# Patient Record
Sex: Female | Born: 1980
Health system: Southern US, Community
[De-identification: ages and names within clinical notes are randomized; demographics above are authoritative.]

## PROBLEM LIST (undated history)

## (undated) DIAGNOSIS — Z9889 Other specified postprocedural states: Secondary | ICD-10-CM

## (undated) DIAGNOSIS — N979 Female infertility, unspecified: Secondary | ICD-10-CM

## (undated) DIAGNOSIS — R112 Nausea with vomiting, unspecified: Secondary | ICD-10-CM

## (undated) DIAGNOSIS — Z8669 Personal history of other diseases of the nervous system and sense organs: Secondary | ICD-10-CM

## (undated) DIAGNOSIS — F329 Major depressive disorder, single episode, unspecified: Secondary | ICD-10-CM

## (undated) DIAGNOSIS — F32A Depression, unspecified: Secondary | ICD-10-CM

## (undated) DIAGNOSIS — I340 Nonrheumatic mitral (valve) insufficiency: Secondary | ICD-10-CM

## (undated) DIAGNOSIS — R002 Palpitations: Secondary | ICD-10-CM

## (undated) DIAGNOSIS — J45909 Unspecified asthma, uncomplicated: Secondary | ICD-10-CM

## (undated) DIAGNOSIS — K219 Gastro-esophageal reflux disease without esophagitis: Secondary | ICD-10-CM

## (undated) DIAGNOSIS — I341 Nonrheumatic mitral (valve) prolapse: Secondary | ICD-10-CM

## (undated) DIAGNOSIS — F419 Anxiety disorder, unspecified: Secondary | ICD-10-CM

## (undated) DIAGNOSIS — Z8571 Personal history of Hodgkin lymphoma: Secondary | ICD-10-CM

## (undated) DIAGNOSIS — E039 Hypothyroidism, unspecified: Secondary | ICD-10-CM

## (undated) DIAGNOSIS — K121 Other forms of stomatitis: Secondary | ICD-10-CM

## (undated) DIAGNOSIS — E785 Hyperlipidemia, unspecified: Secondary | ICD-10-CM

## (undated) HISTORY — DX: Other forms of stomatitis: K12.1

## (undated) HISTORY — DX: Depression, unspecified: F32.A

## (undated) HISTORY — DX: Hypothyroidism, unspecified: E03.9

## (undated) HISTORY — DX: Major depressive disorder, single episode, unspecified: F32.9

## (undated) HISTORY — DX: Gastro-esophageal reflux disease without esophagitis: K21.9

## (undated) HISTORY — PX: TRANSTHORACIC ECHOCARDIOGRAM: SHX275

## (undated) HISTORY — DX: Anxiety disorder, unspecified: F41.9

## (undated) HISTORY — DX: Nonrheumatic mitral (valve) insufficiency: I34.0

## (undated) HISTORY — DX: Personal history of other diseases of the nervous system and sense organs: Z86.69

## (undated) HISTORY — PX: WISDOM TOOTH EXTRACTION: SHX21

## (undated) HISTORY — DX: Personal history of Hodgkin lymphoma: Z85.71

## (undated) HISTORY — DX: Nonrheumatic mitral (valve) prolapse: I34.1

## (undated) HISTORY — PX: FOOT SURGERY: SHX648

## (undated) HISTORY — DX: Palpitations: R00.2

---
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
## 2000-07-17 HISTORY — PX: BREAST ENHANCEMENT SURGERY: SHX7

## 2004-07-17 HISTORY — PX: INSERTION CENTRAL VENOUS ACCESS DEVICE W/ SUBCUTANEOUS PORT: SUR725

## 2004-07-17 HISTORY — PX: LYMPH NODE BIOPSY: SHX201

## 2005-07-17 DIAGNOSIS — Z8571 Personal history of Hodgkin lymphoma: Secondary | ICD-10-CM

## 2005-07-17 HISTORY — PX: RHINOPLASTY: SUR1284

## 2005-07-17 HISTORY — DX: Personal history of Hodgkin lymphoma: Z85.71

## 2006-01-07 ENCOUNTER — Emergency Department: Payer: Self-pay | Admitting: Unknown Physician Specialty

## 2006-05-11 ENCOUNTER — Ambulatory Visit: Payer: Self-pay | Admitting: Family Medicine

## 2006-05-11 ENCOUNTER — Ambulatory Visit: Payer: Self-pay | Admitting: Internal Medicine

## 2006-05-14 ENCOUNTER — Ambulatory Visit (HOSPITAL_COMMUNITY): Admission: RE | Admit: 2006-05-14 | Discharge: 2006-05-14 | Payer: Self-pay | Admitting: Internal Medicine

## 2006-05-15 ENCOUNTER — Ambulatory Visit: Payer: Self-pay | Admitting: Surgery

## 2006-05-17 ENCOUNTER — Ambulatory Visit: Payer: Self-pay | Admitting: Internal Medicine

## 2006-05-22 ENCOUNTER — Ambulatory Visit: Payer: Self-pay | Admitting: Surgery

## 2006-06-12 ENCOUNTER — Inpatient Hospital Stay: Payer: Self-pay | Admitting: Surgery

## 2006-06-12 ENCOUNTER — Other Ambulatory Visit: Payer: Self-pay

## 2006-06-16 ENCOUNTER — Ambulatory Visit: Payer: Self-pay | Admitting: Internal Medicine

## 2006-07-05 ENCOUNTER — Inpatient Hospital Stay: Payer: Self-pay | Admitting: Internal Medicine

## 2006-07-17 ENCOUNTER — Ambulatory Visit: Payer: Self-pay | Admitting: Internal Medicine

## 2006-08-12 ENCOUNTER — Other Ambulatory Visit: Payer: Self-pay

## 2006-08-12 ENCOUNTER — Inpatient Hospital Stay: Payer: Self-pay | Admitting: Oncology

## 2006-08-17 ENCOUNTER — Ambulatory Visit: Payer: Self-pay | Admitting: Internal Medicine

## 2006-09-15 ENCOUNTER — Ambulatory Visit: Payer: Self-pay | Admitting: Internal Medicine

## 2006-09-23 ENCOUNTER — Emergency Department (HOSPITAL_COMMUNITY): Admission: EM | Admit: 2006-09-23 | Discharge: 2006-09-23 | Payer: Self-pay | Admitting: Emergency Medicine

## 2006-10-16 ENCOUNTER — Ambulatory Visit: Payer: Self-pay | Admitting: Internal Medicine

## 2006-10-22 ENCOUNTER — Inpatient Hospital Stay: Payer: Self-pay | Admitting: Internal Medicine

## 2006-11-15 ENCOUNTER — Ambulatory Visit: Payer: Self-pay | Admitting: Internal Medicine

## 2006-11-23 ENCOUNTER — Ambulatory Visit: Payer: Self-pay | Admitting: Internal Medicine

## 2006-12-16 ENCOUNTER — Ambulatory Visit: Payer: Self-pay | Admitting: Internal Medicine

## 2007-01-15 ENCOUNTER — Ambulatory Visit: Payer: Self-pay | Admitting: Internal Medicine

## 2007-01-21 ENCOUNTER — Inpatient Hospital Stay: Payer: Self-pay | Admitting: Internal Medicine

## 2007-02-15 ENCOUNTER — Ambulatory Visit: Payer: Self-pay | Admitting: Internal Medicine

## 2007-06-09 ENCOUNTER — Emergency Department: Payer: Self-pay | Admitting: Emergency Medicine

## 2007-10-04 ENCOUNTER — Emergency Department (HOSPITAL_COMMUNITY): Admission: EM | Admit: 2007-10-04 | Discharge: 2007-10-05 | Payer: Self-pay | Admitting: Emergency Medicine

## 2007-10-16 ENCOUNTER — Ambulatory Visit: Payer: Self-pay | Admitting: Internal Medicine

## 2008-03-17 ENCOUNTER — Ambulatory Visit: Payer: Self-pay | Admitting: Internal Medicine

## 2008-05-27 ENCOUNTER — Other Ambulatory Visit: Payer: Self-pay | Admitting: Emergency Medicine

## 2008-05-27 ENCOUNTER — Inpatient Hospital Stay (HOSPITAL_COMMUNITY): Admission: AD | Admit: 2008-05-27 | Discharge: 2008-05-27 | Payer: Self-pay | Admitting: Obstetrics and Gynecology

## 2008-05-27 ENCOUNTER — Other Ambulatory Visit: Payer: Self-pay | Admitting: Obstetrics and Gynecology

## 2008-05-28 ENCOUNTER — Ambulatory Visit: Payer: Self-pay | Admitting: Unknown Physician Specialty

## 2008-06-01 ENCOUNTER — Emergency Department: Payer: Self-pay | Admitting: Internal Medicine

## 2009-02-06 ENCOUNTER — Inpatient Hospital Stay: Payer: Self-pay | Admitting: Internal Medicine

## 2009-11-10 ENCOUNTER — Ambulatory Visit: Payer: Self-pay | Admitting: Family Medicine

## 2009-11-11 ENCOUNTER — Emergency Department (HOSPITAL_COMMUNITY): Admission: EM | Admit: 2009-11-11 | Discharge: 2009-11-11 | Payer: Self-pay | Admitting: Emergency Medicine

## 2010-02-21 ENCOUNTER — Other Ambulatory Visit: Admission: RE | Admit: 2010-02-21 | Discharge: 2010-02-21 | Payer: Self-pay | Admitting: Obstetrics and Gynecology

## 2010-03-23 ENCOUNTER — Ambulatory Visit: Payer: Self-pay | Admitting: Family Medicine

## 2010-08-30 ENCOUNTER — Other Ambulatory Visit (HOSPITAL_COMMUNITY): Payer: Self-pay | Admitting: Obstetrics and Gynecology

## 2010-08-30 DIAGNOSIS — N979 Female infertility, unspecified: Secondary | ICD-10-CM

## 2010-09-05 ENCOUNTER — Institutional Professional Consult (permissible substitution) (INDEPENDENT_AMBULATORY_CARE_PROVIDER_SITE_OTHER): Payer: Managed Care, Other (non HMO) | Admitting: Family Medicine

## 2010-09-05 DIAGNOSIS — Z79899 Other long term (current) drug therapy: Secondary | ICD-10-CM

## 2010-09-05 DIAGNOSIS — F410 Panic disorder [episodic paroxysmal anxiety] without agoraphobia: Secondary | ICD-10-CM

## 2010-09-05 DIAGNOSIS — I7300 Raynaud's syndrome without gangrene: Secondary | ICD-10-CM

## 2010-09-05 DIAGNOSIS — E039 Hypothyroidism, unspecified: Secondary | ICD-10-CM

## 2010-09-07 ENCOUNTER — Ambulatory Visit (HOSPITAL_COMMUNITY)
Admission: RE | Admit: 2010-09-07 | Discharge: 2010-09-07 | Disposition: A | Discharge status: Home / Self Care | Payer: Managed Care, Other (non HMO) | Source: Ambulatory Visit | Attending: Obstetrics and Gynecology | Admitting: Obstetrics and Gynecology

## 2010-09-07 DIAGNOSIS — N979 Female infertility, unspecified: Secondary | ICD-10-CM

## 2010-09-15 DIAGNOSIS — I340 Nonrheumatic mitral (valve) insufficiency: Secondary | ICD-10-CM

## 2010-09-15 DIAGNOSIS — I341 Nonrheumatic mitral (valve) prolapse: Secondary | ICD-10-CM

## 2010-09-15 HISTORY — DX: Nonrheumatic mitral (valve) prolapse: I34.1

## 2010-09-15 HISTORY — DX: Nonrheumatic mitral (valve) insufficiency: I34.0

## 2010-11-15 HISTORY — PX: TEE WITHOUT CARDIOVERSION: SHX5443

## 2010-11-23 ENCOUNTER — Ambulatory Visit: Payer: Self-pay | Admitting: Internal Medicine

## 2010-11-25 ENCOUNTER — Encounter: Payer: Self-pay | Admitting: Medical

## 2010-11-25 ENCOUNTER — Ambulatory Visit (HOSPITAL_COMMUNITY)
Admission: RE | Admit: 2010-11-25 | Discharge: 2010-11-25 | Disposition: A | Discharge status: Home / Self Care | Payer: Managed Care, Other (non HMO) | Source: Ambulatory Visit | Attending: Medical | Admitting: Medical

## 2010-11-25 ENCOUNTER — Ambulatory Visit (INDEPENDENT_AMBULATORY_CARE_PROVIDER_SITE_OTHER): Payer: Managed Care, Other (non HMO) | Admitting: Medical

## 2010-11-25 VITALS — BP 110/62 | HR 72 | Ht 66.0 in | Wt 131.0 lb

## 2010-11-25 DIAGNOSIS — R011 Cardiac murmur, unspecified: Secondary | ICD-10-CM

## 2010-11-25 DIAGNOSIS — R0602 Shortness of breath: Secondary | ICD-10-CM

## 2010-11-25 DIAGNOSIS — R079 Chest pain, unspecified: Secondary | ICD-10-CM

## 2010-11-25 DIAGNOSIS — R5383 Other fatigue: Secondary | ICD-10-CM

## 2010-11-25 DIAGNOSIS — R5381 Other malaise: Secondary | ICD-10-CM

## 2010-11-25 NOTE — Patient Instructions (Signed)
Go and have chest xray today.  Dr. Susann Givens will call you with results and plan.

## 2010-11-25 NOTE — Progress Notes (Signed)
Here today for c/o shortness of breath and fatigue.  Hasn't been feeling well. She was in her usual state of health until Monday of this week. She has a history of Hodgkin's lymphoma. She is in remission and sees [facility] for recheck regularly. Since Monday she has been having frequent chest pains that are sharp and brief, sometimes worse with activity, she notes some recent ankle swelling with no prior history of ankle swelling, she also notes some shortness of breath in general both with rest and activity. She notes more short of breath when lying flat. She has also had some occasional difficulty breathing awakening her from sleep. She has a history of hypothyroidism, however this week she has been much more tired than usual. She went to urgent care twice this week, on the first visit she was prescribed an antibiotic eyedrop for conjunctivitis. She also went to urgent care earlier today, was told that she was having lung spasms and panic attack, was given a breathing treatment, and she felt a little better. However she just continues to feel worse. She has a history of asthma, uses Advair occasionally although that's an old prescription, but hasn't had to use a rescue inhaler in quite some time.  Her oncologist is Dr. Greggory Stallion at [facility]. She has had prior treatment with Adriamycin and and Vinblastin.  Past Medical History  Diagnosis Date  . History of Hodgkin's lymphoma 2007    in remission, sees [facility], Dr. Greggory Stallion  . Hypothyroidism   . Depression   . Anxiety     Review of Systems Constitutional: Positive fatigue; denies fever, chills, sweats, unexpected weight change, anorexia Allergy: negative; denies recent sneezing, itching, congestion Dermatology: denies rash, lumps ENT: Positive hoarseness; no runny nose, ear pain, sore throat, sinus pain Cardiology: + for chest pain, palpitations, lower extremity, edema, orthopnea, paroxysmal nocturnal dyspnea Respiratory: Positive for frequent dry  hacking cough, cough, shortness of breath, dyspnea on exertion, wheezing; no hemoptysis Gastroenterology: Positive for nausea; denies abdominal pain,  vomiting, diarrhea, constipation, changes in bowel movement, dysphagia Hematology: denies bleeding or bruising problems Musculoskeletal: denies arthralgias, myalgias, joint swelling, back pain, neck pain, cramping Ophthalmology: Positive for redness, itching; denies vision changes, discharge Urology: Positive for frequency, denies dysuria, difficulty urinating, hematuria, urinary urgency, incontinence Neurology: no headache, weakness, tingling, numbness, speech abnormality, memory loss, falls, dizziness Psychology: denies depressed mood, agitation, sleep problems   Objective:   Filed Vitals:   11/25/10 1611  BP: 110/62  Pulse: 72    General appearance: alert, no distress, WD/WN, white female HEENT: normocephalic, conjunctiva/corneas normal, sclerae anicteric, PERRLA, EOMi, nares patent, no discharge or erythema, pharynx normal Oral cavity: MMM, tongue normal, teeth normal Neck: supple, no lymphadenopathy, no thyromegaly, no JVD,  No bruit Chest: non tender, normal shape and expansion Heart: Positive for 3/6 holosystolic murmur at lower left sternal border,This seems to be a new murmur  no radiation of the murmur; otherwise RRR, normal S1, S2, no gallops. Lungs: CTA bilaterally, no wheezes, rhonchi, or rales Abdomen: +bs, soft, non tender, non distended, no masses, no hepatomegaly, no splenomegaly, no bruits Back: non tender, normal ROM, no scoliosis Extremities: no edema currently, no cyanosis, no clubbing Pulses: 2+ symmetric, upper and lower extremities, normal cap refill Neurological: alert, oriented x 3, CN2-12 intact Psychiatric: normal affect, behavior normal, pleasant   ASSESSMENT: Encounter Diagnoses  Name Primary?  . Heart murmur Yes  . Chest pain   . Shortness of breath   . Fatigue  EKG today with normal sinus  rhythm, rate 80 beats per minute, normal intervals, axis 80, nonspecific T wave inversions in V1 and V2, otherwise no ST changes, no prior to compare, risk factors he goes former smoker, prior chemotherapy and radiation treatment , impression normal EKG.  PLAN:  Discuss case with Dr. Susann Givens, discussed possible etiologies. I gave her a sample Xopenex inhaler to use when necessary for shortness of breath since her old inhaler and read out. She will go for chest x-ray now, and we will call her with plan. Advised that she would likely need additional evaluation with an echocardiogram.

## 2010-11-28 ENCOUNTER — Encounter: Payer: Self-pay | Admitting: Medical

## 2010-11-30 ENCOUNTER — Ambulatory Visit (HOSPITAL_COMMUNITY): Payer: Managed Care, Other (non HMO)

## 2010-12-06 ENCOUNTER — Ambulatory Visit (HOSPITAL_COMMUNITY)
Admission: RE | Admit: 2010-12-06 | Discharge: 2010-12-06 | Disposition: A | Discharge status: Home / Self Care | Payer: Managed Care, Other (non HMO) | Source: Ambulatory Visit | Attending: Cardiovascular Disease | Admitting: Cardiovascular Disease

## 2010-12-06 DIAGNOSIS — I059 Rheumatic mitral valve disease, unspecified: Secondary | ICD-10-CM | POA: Insufficient documentation

## 2010-12-11 ENCOUNTER — Emergency Department: Payer: Self-pay | Admitting: Emergency Medicine

## 2010-12-16 HISTORY — PX: NM MYOCAR PERF WALL MOTION: HXRAD629

## 2010-12-20 ENCOUNTER — Ambulatory Visit (HOSPITAL_COMMUNITY)
Admission: RE | Admit: 2010-12-20 | Discharge: 2010-12-20 | Disposition: A | Discharge status: Home / Self Care | Payer: Managed Care, Other (non HMO) | Source: Ambulatory Visit | Attending: Cardiology | Admitting: Cardiology

## 2010-12-20 DIAGNOSIS — R0609 Other forms of dyspnea: Secondary | ICD-10-CM | POA: Insufficient documentation

## 2010-12-20 DIAGNOSIS — R079 Chest pain, unspecified: Secondary | ICD-10-CM | POA: Insufficient documentation

## 2010-12-20 DIAGNOSIS — R0989 Other specified symptoms and signs involving the circulatory and respiratory systems: Secondary | ICD-10-CM | POA: Insufficient documentation

## 2010-12-29 ENCOUNTER — Ambulatory Visit (INDEPENDENT_AMBULATORY_CARE_PROVIDER_SITE_OTHER): Payer: Managed Care, Other (non HMO) | Admitting: Family Medicine

## 2010-12-29 ENCOUNTER — Encounter: Payer: Self-pay | Admitting: Family Medicine

## 2010-12-29 VITALS — BP 120/86 | HR 80 | Wt 130.0 lb

## 2010-12-29 DIAGNOSIS — Z79899 Other long term (current) drug therapy: Secondary | ICD-10-CM

## 2010-12-29 DIAGNOSIS — R0602 Shortness of breath: Secondary | ICD-10-CM

## 2010-12-29 DIAGNOSIS — C8190 Hodgkin lymphoma, unspecified, unspecified site: Secondary | ICD-10-CM | POA: Insufficient documentation

## 2010-12-29 DIAGNOSIS — E039 Hypothyroidism, unspecified: Secondary | ICD-10-CM

## 2010-12-29 DIAGNOSIS — I059 Rheumatic mitral valve disease, unspecified: Secondary | ICD-10-CM

## 2010-12-29 DIAGNOSIS — I341 Nonrheumatic mitral (valve) prolapse: Secondary | ICD-10-CM | POA: Insufficient documentation

## 2010-12-29 DIAGNOSIS — I7300 Raynaud's syndrome without gangrene: Secondary | ICD-10-CM

## 2010-12-29 DIAGNOSIS — R490 Dysphonia: Secondary | ICD-10-CM

## 2010-12-29 LAB — COMPREHENSIVE METABOLIC PANEL
Albumin: 4.6 g/dL (ref 3.5–5.2)
BUN: 14 mg/dL (ref 6–23)
Calcium: 9.7 mg/dL (ref 8.4–10.5)
Chloride: 104 mEq/L (ref 96–112)
Glucose, Bld: 100 mg/dL — ABNORMAL HIGH (ref 70–99)
Potassium: 4 mEq/L (ref 3.5–5.3)

## 2010-12-29 LAB — CBC WITH DIFFERENTIAL/PLATELET
Basophils Relative: 0 % (ref 0–1)
HCT: 37.9 % (ref 36.0–46.0)
Hemoglobin: 13 g/dL (ref 12.0–15.0)
MCHC: 34.3 g/dL (ref 30.0–36.0)
MCV: 91.5 fL (ref 78.0–100.0)
Monocytes Absolute: 0.7 10*3/uL (ref 0.1–1.0)
Monocytes Relative: 7 % (ref 3–12)
Neutro Abs: 6.2 10*3/uL (ref 1.7–7.7)

## 2010-12-29 MED ORDER — TRIAMCINOLONE ACETONIDE 0.1 % MT PSTE: PASTE | Freq: Two times a day (BID) | OROMUCOSAL | Status: DC

## 2010-12-29 NOTE — Progress Notes (Signed)
  Subjective:    Patient ID: Lisa Waters, female    DOB: 1980/08/20, 30 y.o.   MRN: [id number]  HPI She is here for consult concerning continued difficulty with chest pain/tightness/course voice it seems to be intermittent in nature. She has been seen recently by cardiology and apparently does have MVP and presently is on Corag. She's been getting a lot of multiple ulcers and is also on steroids. She's been tried on Xopenex did not help with her symptoms. She has had coronary function testing which was apparently negative. She was seen by her oncologist in March and a CTscan was apparently negative. She also has a history of Raynaud's phenomenon. Towards the end of the encounter she then did mention something about easy bruisability. No nosebleeds, blood in her urine or stool.   Review of Systems     Objective:   Physical Exam Alert and in no distress. Cardiac exam does show a systolic murmur. Lungs are clear to auscultation. Throat is clear. Oral much also does show an ulcerated lesion on the left lower lip       Assessment & Plan:  Etiology of her symptoms is unclear. I will have her use Prilosec 40 mg to see what if any effect this will have. I will also do routine blood screening. Prescription for Kenalog in [place] was also given. I had an extensive conversation with her concerning fact that all of these consolation of symptoms could be the sign of an under lying as yet undiagnosed problem.

## 2010-12-29 NOTE — Patient Instructions (Signed)
Use to Prilosec per day at the same time for the next week or 2 and let me know how you do. We will do the blood work and I will call you with the results

## 2010-12-30 ENCOUNTER — Telehealth: Payer: Self-pay

## 2010-12-30 NOTE — Telephone Encounter (Signed)
Called pt left message that labs ok and to follow up in july

## 2011-01-13 ENCOUNTER — Encounter: Payer: Self-pay | Admitting: Family Medicine

## 2011-01-20 ENCOUNTER — Encounter: Payer: Self-pay | Admitting: Family Medicine

## 2011-01-20 ENCOUNTER — Ambulatory Visit (INDEPENDENT_AMBULATORY_CARE_PROVIDER_SITE_OTHER): Payer: Managed Care, Other (non HMO) | Admitting: Family Medicine

## 2011-01-20 DIAGNOSIS — H109 Unspecified conjunctivitis: Secondary | ICD-10-CM

## 2011-01-20 DIAGNOSIS — J029 Acute pharyngitis, unspecified: Secondary | ICD-10-CM

## 2011-01-20 DIAGNOSIS — J309 Allergic rhinitis, unspecified: Secondary | ICD-10-CM

## 2011-01-20 LAB — POCT RAPID STREP A (OFFICE): Rapid Strep A Screen: NEGATIVE

## 2011-01-20 MED ORDER — POLYMYXIN B-TRIMETHOPRIM 10000-0.1 UNIT/ML-% OP SOLN: 1.0000 [drp] | OPHTHALMIC | Status: AC

## 2011-01-20 MED ORDER — FLUORESCEIN SODIUM 1 MG OP STRP: 1.0000 | ORAL_STRIP | Freq: Once | OPHTHALMIC | Status: DC

## 2011-01-20 NOTE — Progress Notes (Signed)
Patient presents for evaluation of bilateral eye pain and discharged.  First noticed a little bit of redness and discomfort starting yesterday.  Today is getting worse--some crusting noted this morning (but not crusted shut).  Notes green discharge.  Describes eyes as painful, unsure if itchy.  Eyes are sensitive to the sunlight today.  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  Also complaining of sore throat.  Hurts when she swallows, feels swollen.  Ears are a little itchy.  Some runny nose. Denies sneezing.  She has been remodeling a house--a lot of exposure to dust, etc with construction.  Past Medical History  Diagnosis Date  . History of Hodgkin's lymphoma 2007    in remission, sees [facility], Dr. Greggory Stallion  . Hypothyroidism   . Depression   . Anxiety     No past surgical history on file.  History   Social History  . Marital Status: Married    Spouse Name: N/A    Number of Children: N/A  . Years of Education: N/A   Occupational History  . works in [employer]   Social History Main Topics  . Smoking status: Former Smoker    Quit date: 11/06/2009  . Smokeless tobacco: Never Used  . Alcohol Use: Yes     1-2 drinks twice a month  . Drug Use: No  . Sexually Active: Not on file   Other Topics Concern  . Not on file   Social History Narrative  . No narrative on file    Family History  Problem Relation Age of Onset  . Heart disease Mother   . Heart disease Father     Current outpatient prescriptions:carvedilol (COREG) 6.25 MG tablet, Take 6.25 mg by mouth 2 (two) times daily with a meal.  , Disp: , Rfl: ;  levothyroxine (SYNTHROID) 88 MCG tablet, Take 88 mcg by mouth daily.  , Disp: , Rfl: ;  sertraline (ZOLOFT) 50 MG tablet, Take 50 mg by mouth daily.  , Disp: , Rfl: ;  triamcinolone (KENALOG) 0.1 % paste, Place onto teeth 2 (two) times daily., Disp: 5 g, Rfl: 12 DISCONTD: levothyroxine (SYNTHROID,  LEVOTHROID) 88 MCG tablet, Take 88 mcg by mouth daily.  , Disp: , Rfl: ;  trimethoprim-polymyxin b (POLYTRIM) ophthalmic solution, Place 1 drop into both eyes every 4 (four) hours., Disp: 10 mL, Rfl: 0;  DISCONTD: azithromycin (ZITHROMAX) 250 MG tablet, Take 2 tablets by mouth on day 1, followed by 1 tablet by mouth daily for 4 days.  , Disp: , Rfl:  DISCONTD: predniSONE (DELTASONE) 10 MG tablet pack, Take 10 mg by mouth daily.  , Disp: , Rfl:  Current facility-administered medications:fluorescein ophthalmic strip 1 strip, 1 strip, Both Eyes, Once, Lavonda Jumbo, MD  Allergies  Allergen Reactions  . Codeine Hives   ROS:  Denies fever, chest pain, cough, SOB, rash, edema.  See HPI   PHYSICAL EXAM: BP 102/62  Pulse 84  Temp(Src) 98.4 F (36.9 C) (Oral)  Ht 5\' 6"  (1.676 m)  Wt 136 lb (61.689 kg)  BMI 21.95 kg/m2  LMP 01/13/2011 HEENT: PERRL, mild conjunctival injection bilaterally, but worse on R than L (and moderate involvement medially).  Fluorescein exam--no abnormal uptake.  Normal fundoscopic exam.  Mild STS R medial eye.  No erythema/swelling of surrounding eye/face.  +green crusting noted medially R eye. TM's and EAC's normal bilaterally.  Nasal mucosa mildly edematous, no erythema,  clear drainage. OP normal without significant erythema or exudates Neck:  Shotty anterior cervical lymphadenopathy Heart:  Regular rate and rhythm, murmur barely audible Lungs: clear bilaterally Skin: tanned, no rash Psych: normal mood, affect, hygiene and grooming  ASSESSMENT/PLAN: 1. Pharyngitis  POCT rapid strep A  2. Conjunctivitis  fluorescein ophthalmic strip 1 strip, trimethoprim-polymyxin b (POLYTRIM) ophthalmic solution  3. Allergic rhinitis, cause unspecified      Conjunctivitis--will treat for infection given purulent drainage, but likely has contribution of allergies/contact irritation from exposure to dust/construction Allergies-- Recommend antihistamines (Zyrtec or  Claritin) Pharyngitis--may be contributed by postnasal drip. Rapid strep negative

## 2011-01-20 NOTE — Patient Instructions (Signed)
Cool compresses to eyes.  Start Zyrtec (or claritin).  You may use tylenol, or advil or aleve for throat and eye pain.  Use eye drops as directed (every 3 hours while awake for first day, then can taper to every 4-6 hours; use for 1-2 days after eyes seem clear).  Follow up immediately if symptoms are worsening (ie. Worsening eye pain, vision problems, eye swelling) or any other concerns

## 2011-01-22 ENCOUNTER — Emergency Department: Payer: Self-pay | Admitting: Emergency Medicine

## 2011-01-23 ENCOUNTER — Emergency Department (HOSPITAL_COMMUNITY)
Admission: EM | Admit: 2011-01-23 | Discharge: 2011-01-23 | Disposition: A | Discharge status: Home / Self Care | Payer: Managed Care, Other (non HMO) | Attending: Emergency Medicine | Admitting: Emergency Medicine

## 2011-01-23 DIAGNOSIS — J029 Acute pharyngitis, unspecified: Secondary | ICD-10-CM | POA: Insufficient documentation

## 2011-01-23 DIAGNOSIS — E039 Hypothyroidism, unspecified: Secondary | ICD-10-CM | POA: Insufficient documentation

## 2011-01-23 DIAGNOSIS — K120 Recurrent oral aphthae: Secondary | ICD-10-CM | POA: Insufficient documentation

## 2011-01-23 DIAGNOSIS — Z79899 Other long term (current) drug therapy: Secondary | ICD-10-CM | POA: Insufficient documentation

## 2011-01-23 DIAGNOSIS — B9789 Other viral agents as the cause of diseases classified elsewhere: Secondary | ICD-10-CM | POA: Insufficient documentation

## 2011-01-30 ENCOUNTER — Other Ambulatory Visit: Payer: Self-pay | Admitting: Family Medicine

## 2011-01-30 MED ORDER — OMEPRAZOLE 40 MG PO CPDR: 40.0000 mg | DELAYED_RELEASE_CAPSULE | Freq: Every day | ORAL | Status: DC

## 2011-01-30 NOTE — Telephone Encounter (Signed)
The 2 Prilosec day you reccommended per day is helping, is there an Rx she can get, to help with cost of taking 2 per day OTC  [address]

## 2011-01-30 NOTE — Telephone Encounter (Signed)
Find out what the strength of Prilosec as. It is 20 mg give her 40. She is on 40% there is no way to get more

## 2011-03-03 ENCOUNTER — Other Ambulatory Visit: Payer: Self-pay

## 2011-03-03 MED ORDER — OMEPRAZOLE 40 MG PO CPDR: 40.0000 mg | DELAYED_RELEASE_CAPSULE | Freq: Every day | ORAL | Status: DC

## 2011-03-08 ENCOUNTER — Telehealth: Payer: Self-pay | Admitting: Family Medicine

## 2011-03-08 NOTE — Telephone Encounter (Signed)
FAXED APPROVAL TO PHARMACY

## 2011-03-13 ENCOUNTER — Other Ambulatory Visit: Payer: Self-pay | Admitting: Family Medicine

## 2011-03-13 NOTE — Telephone Encounter (Signed)
IS THIS OK 

## 2011-04-04 ENCOUNTER — Telehealth: Payer: Self-pay | Admitting: Family Medicine

## 2011-04-04 MED ORDER — OMEPRAZOLE 40 MG PO CPDR: 40.0000 mg | DELAYED_RELEASE_CAPSULE | Freq: Every day | ORAL | Status: DC

## 2011-04-04 NOTE — Telephone Encounter (Signed)
Sent med in first to wrong [facility]

## 2011-04-10 LAB — CBC
Platelets: 257
WBC: 6.5

## 2011-04-10 LAB — I-STAT 8, (EC8 V) (CONVERTED LAB)
BUN: 10
Bicarbonate: 24.6 — ABNORMAL HIGH
Chloride: 107
HCT: 42
Hemoglobin: 14.3
Operator id: 295131
Sodium: 140

## 2011-04-10 LAB — DIFFERENTIAL
Lymphocytes Relative: 12
Lymphs Abs: 0.8
Neutrophils Relative %: 68

## 2011-04-10 LAB — POCT I-STAT CREATININE
Creatinine, Ser: 0.8
Operator id: 295131

## 2011-04-10 LAB — POCT CARDIAC MARKERS
Myoglobin, poc: 13.8
Troponin i, poc: <0.05

## 2011-04-18 LAB — CROSSMATCH
Antibody Screen: POSITIVE
DAT, IgG: NEGATIVE

## 2011-04-18 LAB — URINALYSIS, ROUTINE W REFLEX MICROSCOPIC
Bilirubin Urine: NEGATIVE
Leukocytes, UA: NEGATIVE
Nitrite: NEGATIVE
Specific Gravity, Urine: 1.008
Urobilinogen, UA: 0.2

## 2011-04-18 LAB — DIFFERENTIAL
Basophils Absolute: 0
Basophils Relative: 0
Lymphocytes Relative: 22
Neutro Abs: 3
Neutrophils Relative %: 54

## 2011-04-18 LAB — URINE MICROSCOPIC-ADD ON

## 2011-04-18 LAB — POCT PREGNANCY, URINE
Preg Test, Ur: NEGATIVE
Preg Test, Ur: NEGATIVE

## 2011-04-18 LAB — CBC
MCV: 97.8
RBC: 3.93
WBC: 5.5

## 2011-04-18 LAB — APTT: aPTT: 28

## 2011-04-18 LAB — URINE CULTURE: Colony Count: 40000

## 2011-04-18 LAB — PROTIME-INR
INR: 1
Prothrombin Time: 13.8

## 2011-06-04 ENCOUNTER — Other Ambulatory Visit: Payer: Self-pay | Admitting: Family Medicine

## 2011-06-17 ENCOUNTER — Other Ambulatory Visit: Payer: Self-pay | Admitting: Family Medicine

## 2011-06-19 NOTE — Telephone Encounter (Signed)
Is this ok?

## 2011-06-19 NOTE — Telephone Encounter (Signed)
She needs an appointment.

## 2011-06-19 NOTE — Telephone Encounter (Signed)
Called pt to let her know med called in but she needs appt left message

## 2011-06-29 ENCOUNTER — Ambulatory Visit (INDEPENDENT_AMBULATORY_CARE_PROVIDER_SITE_OTHER): Payer: Managed Care, Other (non HMO) | Admitting: Family Medicine

## 2011-06-29 ENCOUNTER — Encounter: Payer: Self-pay | Admitting: Family Medicine

## 2011-06-29 VITALS — BP 100/70 | HR 73 | Wt 144.0 lb

## 2011-06-29 DIAGNOSIS — F329 Major depressive disorder, single episode, unspecified: Secondary | ICD-10-CM

## 2011-06-29 DIAGNOSIS — Z8669 Personal history of other diseases of the nervous system and sense organs: Secondary | ICD-10-CM

## 2011-06-29 DIAGNOSIS — F32A Depression, unspecified: Secondary | ICD-10-CM

## 2011-06-29 DIAGNOSIS — Z8571 Personal history of Hodgkin lymphoma: Secondary | ICD-10-CM | POA: Insufficient documentation

## 2011-06-29 DIAGNOSIS — I7300 Raynaud's syndrome without gangrene: Secondary | ICD-10-CM

## 2011-06-29 DIAGNOSIS — E039 Hypothyroidism, unspecified: Secondary | ICD-10-CM

## 2011-06-29 DIAGNOSIS — I059 Rheumatic mitral valve disease, unspecified: Secondary | ICD-10-CM

## 2011-06-29 DIAGNOSIS — R51 Headache: Secondary | ICD-10-CM

## 2011-06-29 DIAGNOSIS — I341 Nonrheumatic mitral (valve) prolapse: Secondary | ICD-10-CM

## 2011-06-29 DIAGNOSIS — F341 Dysthymic disorder: Secondary | ICD-10-CM

## 2011-06-29 NOTE — Progress Notes (Signed)
  Subjective:    Patient ID: Lisa Waters, female    DOB: [birth year], 30 y.o.   MRN: [id number]  HPI She is here for a recheck and refill on Zoloft. She was originally given this in February for panic attacks and depression. It did return her to her normal state. There were less stresses causing difficulty with her about a year ago. She also has continuation of her Raynaud's phenomenon. She did see her oncologist in [place] last month and was told everything is good from her underlying Hodgkin's disease. In the last several weeks she has had the episode of the right I visual changes however since then she has had daily headaches as well as fatigue and aches and pains that are intermittent and in various parts of her body. She describes the pain is sharp, throbbing and bilateral, photophobia but no nausea. She has noted CNS changes of decreased cognition. No family history of headache. She does not drink nor do street drugs.  Review of Systems     Objective:   Physical Exam alert and in no distress. EOMI. DTRs normal. Cerebellar normal. Tympanic membranes and canals are normal. Throat is clear. Tonsils are normal. Neck is supple without adenopathy or thyromegaly. Cardiac exam shows a regular sinus rhythm without murmurs or gallops. Lungs are clear to auscultation.        Assessment & Plan:   1. MVP (mitral valve prolapse)    2. Raynaud's disease    3. History of Hodgkin's disease    4. Anxiety and depression    5. History of amaurosis fugax    6. Hypothyroid    7. Headache  MR Brain W Wo Contrast   She will followup with her cardiologist as scheduled. She has an appointment to be seen by neurology for the amaurosis. I will get blood work, x-ray report and last note from her oncologist.

## 2011-07-03 ENCOUNTER — Telehealth: Payer: Self-pay

## 2011-07-03 ENCOUNTER — Ambulatory Visit
Admission: RE | Admit: 2011-07-03 | Discharge: 2011-07-03 | Disposition: A | Discharge status: Home / Self Care | Payer: Managed Care, Other (non HMO) | Source: Ambulatory Visit | Attending: Family Medicine | Admitting: Family Medicine

## 2011-07-03 DIAGNOSIS — R51 Headache: Secondary | ICD-10-CM

## 2011-07-03 MED ORDER — GADOBENATE DIMEGLUMINE 529 MG/ML IV SOLN
13.0000 mL | Freq: Once | INTRAVENOUS | Status: AC | PRN
  Administered 2011-07-03: 13 mL via INTRAVENOUS

## 2011-07-03 NOTE — Telephone Encounter (Signed)
Pt called to state symptoms worse headache,tinngleing in toes shooting pains in leg advise on what else she can do

## 2011-07-03 NOTE — Telephone Encounter (Signed)
Moved to today.

## 2011-07-03 NOTE — Telephone Encounter (Signed)
Try to get her MRI moved sooner.

## 2011-07-04 ENCOUNTER — Encounter: Payer: Self-pay | Admitting: Family Medicine

## 2011-07-04 ENCOUNTER — Other Ambulatory Visit: Payer: Self-pay | Admitting: Family Medicine

## 2011-07-04 ENCOUNTER — Ambulatory Visit (INDEPENDENT_AMBULATORY_CARE_PROVIDER_SITE_OTHER): Payer: Managed Care, Other (non HMO) | Admitting: Family Medicine

## 2011-07-04 VITALS — BP 110/77 | HR 77

## 2011-07-04 DIAGNOSIS — R292 Abnormal reflex: Secondary | ICD-10-CM

## 2011-07-04 DIAGNOSIS — G43909 Migraine, unspecified, not intractable, without status migrainosus: Secondary | ICD-10-CM

## 2011-07-04 NOTE — Patient Instructions (Signed)
Use the Treximet and let me know how works. Be aware that you might have side effects and if you do let me know per

## 2011-07-04 NOTE — Progress Notes (Signed)
  Subjective:    Patient ID: Lisa Waters, female    DOB: 03/03/1981, 30 y.o.   MRN: [id number]  HPI Continues to have difficulty with daily bitemporal throbbing type headaches with photophobia and phonophobia and nausea. No weakness, numbness or tingling. Also within the last week she has noted sharp stabbing type pains mainly in her right leg but also in her left axilla   Review of Systems     Objective:   Physical Exam Alert and in no distress. Extraocular muscles intact. Fundi benign. Neck is supple without adenopathy. Throat is clear. Cardiac exam normal. Reflexes were 3+ with 2 beat clonus noted.       Assessment & Plan:  History is consistent with migraine headache although her other pains are not as easily identified. A sample of fracture in that given. Discussed possible side effects with her. I will also draw routine bloods plus CRP and sedimentation rate.

## 2011-07-05 LAB — CBC WITH DIFFERENTIAL/PLATELET
Eosinophils Relative: 8 % — ABNORMAL HIGH (ref 0–5)
HCT: 37.5 % (ref 36.0–46.0)
Lymphocytes Relative: 27 % (ref 12–46)
Lymphs Abs: 1.5 10*3/uL (ref 0.7–4.0)
MCV: 90.8 fL (ref 78.0–100.0)
Monocytes Absolute: 0.3 10*3/uL (ref 0.1–1.0)
Monocytes Relative: 6 % (ref 3–12)
RBC: 4.13 MIL/uL (ref 3.87–5.11)
RDW: 12.8 % (ref 11.5–15.5)
WBC: 5.4 10*3/uL (ref 4.0–10.5)

## 2011-07-05 LAB — TSH: TSH: 1.984 u[IU]/mL (ref 0.350–4.500)

## 2011-07-05 LAB — COMPREHENSIVE METABOLIC PANEL
BUN: 11 mg/dL (ref 6–23)
CO2: 28 mEq/L (ref 19–32)
Calcium: 9.4 mg/dL (ref 8.4–10.5)
Chloride: 103 mEq/L (ref 96–112)
Creat: 0.8 mg/dL (ref 0.50–1.10)
Glucose, Bld: 80 mg/dL (ref 70–99)

## 2011-07-05 LAB — C-REACTIVE PROTEIN: CRP: 0.03 mg/dL (ref <0.60)

## 2011-07-05 MED ORDER — SUMATRIPTAN SUCCINATE 100 MG PO TABS: 100.0000 mg | ORAL_TABLET | ORAL | Status: DC | PRN

## 2011-07-05 NOTE — Progress Notes (Signed)
Addended by: Ronnald Nian on: 07/05/2011 01:58 PM   Modules accepted: Orders

## 2011-07-05 NOTE — Progress Notes (Signed)
  Subjective:    Patient ID: Lisa Waters, female    DOB: 02-Jun-1981, 30 y.o.   MRN: [id number]  HPI    Review of Systems     Objective:   Physical Exam        Assessment & Plan:  Her blood work is normal. She does state that the Treximet did help with her symptoms. I recommend she take sumatriptan again and repeated in 2 hours. She will call me tomorrow.

## 2011-07-06 ENCOUNTER — Other Ambulatory Visit: Payer: Managed Care, Other (non HMO)

## 2011-07-07 ENCOUNTER — Telehealth: Payer: Self-pay | Admitting: Internal Medicine

## 2011-07-07 NOTE — Telephone Encounter (Signed)
She is scheduled to see Dr. Pearlean Brownie Jan 21. See if you can get this moved up

## 2011-07-12 ENCOUNTER — Other Ambulatory Visit: Payer: Self-pay | Admitting: Family Medicine

## 2011-07-12 NOTE — Telephone Encounter (Signed)
Office closed. 

## 2011-07-13 NOTE — Telephone Encounter (Signed)
Is this ok?

## 2011-08-11 ENCOUNTER — Other Ambulatory Visit: Payer: Self-pay | Admitting: Family Medicine

## 2011-08-19 ENCOUNTER — Other Ambulatory Visit: Payer: Self-pay | Admitting: Family Medicine

## 2011-08-21 NOTE — Telephone Encounter (Signed)
Is this ok?

## 2011-08-28 ENCOUNTER — Ambulatory Visit (INDEPENDENT_AMBULATORY_CARE_PROVIDER_SITE_OTHER): Payer: Managed Care, Other (non HMO) | Admitting: Family Medicine

## 2011-08-28 VITALS — BP 110/64 | HR 66 | Temp 98.6°F | Resp 16 | Ht 66.0 in | Wt 142.6 lb

## 2011-08-28 DIAGNOSIS — R509 Fever, unspecified: Secondary | ICD-10-CM

## 2011-08-28 LAB — POCT INFLUENZA A/B
Influenza A, POC: NEGATIVE
Influenza B, POC: NEGATIVE

## 2011-08-28 MED ORDER — OSELTAMIVIR PHOSPHATE 75 MG PO CAPS: 75.0000 mg | ORAL_CAPSULE | Freq: Two times a day (BID) | ORAL | Status: AC

## 2011-08-28 NOTE — Progress Notes (Signed)
[facility]:  Office Visit  Chief Complaint:  Chief Complaint  Patient presents with  . Chills    today  . achy    today  . Fever    today    HPI: Lisa Waters is a 31 y.o. female who complains of  1 day h/o subjective fevers, chills, msk aches. No treatment. No other sxs.   Past Medical History  Diagnosis Date  . History of Hodgkin's lymphoma 2007    in remission, sees [facility], Dr. Greggory Waters  . Hypothyroidism   . Depression   . Anxiety   . GERD (gastroesophageal reflux disease)    Past Surgical History  Procedure Date  . Appendectomy   . Breast enhancement surgery   . Rhinoplasty deviated septum  . Lymphnode biopsy    History   Social History  . Marital Status: Married    Spouse Name: N/A    Number of Children: N/A  . Years of Education: N/A   Occupational History  . works in [employer]   Social History Main Topics  . Smoking status: Former Smoker -- 1.0 packs/day for 10 years    Types: Cigarettes    Quit date: 10/15/2009  . Smokeless tobacco: Never Used  . Alcohol Use: Yes     1-2 drinks twice a month  . Drug Use: No  . Sexually Active: None   Other Topics Concern  . None   Social History Narrative  . None   Family History  Problem Relation Age of Onset  . Heart disease Mother   . Heart disease Father    Allergies  Allergen Reactions  . Codeine Hives   Prior to Admission medications   Medication Sig Start Date End Date Taking? Authorizing Provider  levothyroxine (SYNTHROID) 88 MCG tablet Take 88 mcg by mouth daily.     Yes Historical Provider, MD  omeprazole (PRILOSEC) 40 MG capsule TAKE 1 CAPSULE BY MOUTH EVERY DAY 08/11/11  Yes Lisa Herter, MD  pindolol (VISKEN) 5 MG tablet Take 5 mg by mouth 2 (two) times daily.     Yes Historical Provider, MD  sertraline (ZOLOFT) 50 MG tablet TAKE 1 TABLET BY MOUTH EVERY DAY 08/19/11  Yes Lisa Herter, MD  carvedilol (COREG) 6.25 MG tablet Take 6.25 mg by mouth 2 (two)  times daily with a meal.      Historical Provider, MD  omeprazole (PRILOSEC) 40 MG capsule Take 1 capsule (40 mg total) by mouth daily. 04/04/11 04/03/12  Lisa Herter, MD  SUMAtriptan (IMITREX) 100 MG tablet Take 1 tablet (100 mg total) by mouth every 2 (two) hours as needed for migraine. Not to exceed more than 2 tablets per 24 hours 07/05/11 07/04/12  Lisa Herter, MD     ROS: The patient denies night sweats, unintentional weight loss, chest pain, palpitations, wheezing, dyspnea on exertion, nausea, vomiting, abdominal pain, dysuria, hematuria, melena, numbness, weakness, or tingling. +fevers, chills, msk aches.   All other systems have been reviewed and were otherwise negative with the exception of those mentioned in the HPI and as above.    PHYSICAL EXAM: Filed Vitals:   08/28/11 1808  BP: 110/64  Pulse: 66  Temp: 98.6 F (37 C)  Resp: 16   Filed Vitals:   08/28/11 1808  Height: 5\' 6"  (1.676 m)  Weight: 142 lb 9.6 oz (64.683 kg)   Body mass index is 23.02 kg/(m^2).  General: Alert, no acute distress HEENT:  Normocephalic,  atraumatic, oropharynx patent. NO LAD, TMs clear bilaterally, no sinus tenderness Cardiovascular:  Regular rate and rhythm, no rubs or gallops.  No Carotid bruits, radial pulse intact. No pedal edema. +click/murmur Respiratory: Clear to auscultation bilaterally.  No wheezes, rales, or rhonchi.  No cyanosis, no use of accessory musculature GI: No organomegaly, abdomen is soft and non-tender, positive bowel sounds.  No masses. Skin: No rashes. Neurologic: Facial musculature symmetric. Psychiatric: Patient is appropriate throughout our interaction. Lymphatic: No cervical lymphadenopathy Musculoskeletal: Gait intact.    ASSESSMENT/PLAN: Encounter Diagnosis  Name Primary?  . Fever chills Yes    Viral URI vs Flu- Flu negative but will presumptively treat as if have flu. She has a  Hx of Hodgkin's LYmphoma. Rx Tamiflu 75 mg BID x 5  days   Lisa Yager PHUONG, DO 08/28/2011 6:42 PM

## 2011-09-08 ENCOUNTER — Ambulatory Visit (INDEPENDENT_AMBULATORY_CARE_PROVIDER_SITE_OTHER): Payer: Managed Care, Other (non HMO) | Admitting: Medical

## 2011-09-08 ENCOUNTER — Encounter: Payer: Self-pay | Admitting: Medical

## 2011-09-08 VITALS — BP 102/70 | HR 100 | Temp 98.1°F | Resp 16 | Wt 138.0 lb

## 2011-09-08 DIAGNOSIS — K5289 Other specified noninfective gastroenteritis and colitis: Secondary | ICD-10-CM

## 2011-09-08 DIAGNOSIS — R195 Other fecal abnormalities: Secondary | ICD-10-CM

## 2011-09-08 DIAGNOSIS — R110 Nausea: Secondary | ICD-10-CM

## 2011-09-08 DIAGNOSIS — K921 Melena: Secondary | ICD-10-CM

## 2011-09-08 DIAGNOSIS — K529 Noninfective gastroenteritis and colitis, unspecified: Secondary | ICD-10-CM

## 2011-09-08 DIAGNOSIS — R197 Diarrhea, unspecified: Secondary | ICD-10-CM

## 2011-09-08 LAB — CBC WITH DIFFERENTIAL/PLATELET
Basophils Absolute: 0 10*3/uL (ref 0.0–0.1)
Basophils Relative: 1 % (ref 0–1)
Eosinophils Absolute: 0.4 10*3/uL (ref 0.0–0.7)
Hemoglobin: 12.6 g/dL (ref 12.0–15.0)
MCH: 30.4 pg (ref 26.0–34.0)
MCHC: 33.3 g/dL (ref 30.0–36.0)
Monocytes Relative: 14 % — ABNORMAL HIGH (ref 3–12)
Neutrophils Relative %: 47 % (ref 43–77)
RDW: 13.5 % (ref 11.5–15.5)

## 2011-09-08 NOTE — Progress Notes (Signed)
Subjective: Here today with 2 day hx/o nausea, diarrhea, numerous times the first day, then 7-8 loose diarrhea stools today.  Stomach is upset, nauseated, but no frank pain or vomiting.  She reports some dizziness, generalized weakness, decreased appetite, not able to replace her fluids fast enough to keep up with the loose stools.  Denies back pain, no fever, chills, or sweats.  She is worried because her stool today for the most part has been black in color.  She denies problems with constipation, hemorrhoids, denies bright red blood, no coffee ground stool, no hx/o ulcers, not using NSAIDs other than Aspirin daily.  She does note remote hx/o hematemesis years ago, had NG tube to aspirate stomach contents, but no hx/o EGD or ulcer.  She is taking Prilosec for hx/o GERD.  Denies sick contacts.  She notes eating some chili 2 days ago.  She denies recent travel, no undercooked meat ingested.  She was sick last week, put on Tamiflu for influenza.  No other aggravating or relieving factors.  No other c/o.  The following portions of the patient's history were reviewed and updated as appropriate: allergies, current medications, past family history, past medical history, past social history, past surgical history and problem list.  Past Medical History  Diagnosis Date  . History of Hodgkin's lymphoma 2007    in remission, sees [facility], Dr. Greggory Stallion  . Hypothyroidism   . Depression   . Anxiety   . GERD (gastroesophageal reflux disease)     Allergies  Allergen Reactions  . Codeine Hives    Review of Systems Gen: no fever, chills, weight loss Skin: rash Heent: runny nose, but no cough, no sore throat, no ear pain Heart: no CP, palpitations, edema Lungs: no SOB, wheezing GU: no burning, frequency, urgency, hematuria    Objective:   Physical Exam  General appearance: alert, no distress, WD/WN, fatigued appearing HEENT: normocephalic, sclerae anicteric, TMs pearly, nares patent, no discharge or erythema,  pharynx normal Oral cavity: MMM, no lesions Neck: supple, no lymphadenopathy, no thyromegaly, no masses Heart: RRR, normal S1, S2, no murmurs Lungs: CTA bilaterally, no wheezes, rhonchi, or rales Abdomen: +increased bs, soft, non tender, non distended, no masses, no hepatomegaly, no splenomegaly Rectal: no external hemorrhoids, occult negative, no obvious mass with DRE, exam chaperoned by nurse   Assessment and Plan :     Encounter Diagnoses  Name Primary?  . Diarrhea Yes  . Black stool   . Nausea   . Gastroenteritis    Orthostatics with drop in SBP and increase in pulse.  Advised that her symptoms mostly suggest viral gastroenteritis.  The black color could be from chili ingredients or viral etiology.  Can't rule out bleed entirely though.   Will check CBC stat.  Advised increased hydration, rest, wash hands often, discussed means of transmission of viral gastroenteritis.  Will call with lab results. For now, advises she stop Aspirin for few days, avoid NSAIDS, and increase Prilosec to BID for next several days, and if worse, unable to hydrate well, then go to the ED.  Otherwise f/u pending labs.

## 2011-09-16 ENCOUNTER — Other Ambulatory Visit: Payer: Self-pay | Admitting: Family Medicine

## 2011-09-18 ENCOUNTER — Other Ambulatory Visit: Payer: Self-pay | Admitting: Family Medicine

## 2011-09-18 NOTE — Telephone Encounter (Signed)
Is this ok?

## 2011-11-01 ENCOUNTER — Ambulatory Visit (INDEPENDENT_AMBULATORY_CARE_PROVIDER_SITE_OTHER): Payer: Managed Care, Other (non HMO) | Admitting: Family Medicine

## 2011-11-01 ENCOUNTER — Encounter: Payer: Self-pay | Admitting: Family Medicine

## 2011-11-01 VITALS — BP 108/70 | HR 61 | Wt 137.0 lb

## 2011-11-01 DIAGNOSIS — M67919 Unspecified disorder of synovium and tendon, unspecified shoulder: Secondary | ICD-10-CM

## 2011-11-01 DIAGNOSIS — M7581 Other shoulder lesions, right shoulder: Secondary | ICD-10-CM

## 2011-11-01 NOTE — Patient Instructions (Signed)
History shoulder does not return to normal after a short period of time, call me in a month or so.

## 2011-11-01 NOTE — Progress Notes (Signed)
  Subjective:    Patient ID: Lisa Waters, female    DOB: 1981-04-11, 31 y.o.   MRN: [id number]  HPI He has a one-month history of right shoulder pain. No history of injury to the gets increased pain with abduction and external rotate this does not wake her at night. She has not really tried anything to help this. 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   Review of Systems     Objective:   Physical Exam Full range of motion of the shoulder but some discomfort with motion. No laxity noted. Arm test negative. Supraspinatus testing was uncomfortable. Neer's and Hawkin's test was again uncomfortable. She also notes that her headache symptoms greatly cleared up after she got a a new pair of glasses.         Assessment & Plan:   1. Tendinitis of right rotator cuff    I discussed options with her including conservative care with anti-inflammatories and physical therapy. We also discussed injection. She would like to try an injection. 40 mg of Kenalog and 3 cc of Xylocaine was injected into the right subacromial bursa without difficulty. She tolerated the procedure well. Discussed followup based on pain. Hopefully this will give her several months of relief. If it does not, she is to call me

## 2011-11-03 ENCOUNTER — Telehealth: Payer: Self-pay | Admitting: Internal Medicine

## 2011-11-03 ENCOUNTER — Ambulatory Visit
Admission: RE | Admit: 2011-11-03 | Discharge: 2011-11-03 | Disposition: A | Discharge status: Home / Self Care | Payer: Managed Care, Other (non HMO) | Source: Ambulatory Visit | Attending: Family Medicine | Admitting: Family Medicine

## 2011-11-03 ENCOUNTER — Other Ambulatory Visit: Payer: Self-pay

## 2011-11-03 DIAGNOSIS — M25519 Pain in unspecified shoulder: Secondary | ICD-10-CM

## 2011-11-03 NOTE — Telephone Encounter (Signed)
Put order in system called pt and informed her of where to go

## 2011-11-03 NOTE — Telephone Encounter (Signed)
Send her for an xray 

## 2011-11-06 ENCOUNTER — Telehealth: Payer: Self-pay | Admitting: Internal Medicine

## 2011-11-07 NOTE — Telephone Encounter (Signed)
Left message word for word  

## 2011-11-07 NOTE — Telephone Encounter (Signed)
Pt states she has been getting dizzy spells and feels like she is being electrocuted in her hands and goes away after a few seconds. Pt just wants to know if its something to worry about

## 2011-11-07 NOTE — Telephone Encounter (Signed)
If this continues have her set up an appointment

## 2011-11-08 ENCOUNTER — Other Ambulatory Visit: Payer: Self-pay | Admitting: Family Medicine

## 2011-11-08 NOTE — Telephone Encounter (Signed)
Is this ok?

## 2011-11-13 ENCOUNTER — Ambulatory Visit (INDEPENDENT_AMBULATORY_CARE_PROVIDER_SITE_OTHER): Payer: Managed Care, Other (non HMO) | Admitting: Family Medicine

## 2011-11-13 VITALS — BP 110/60 | HR 86 | Temp 98.6°F | Wt 136.0 lb

## 2011-11-13 DIAGNOSIS — G8929 Other chronic pain: Secondary | ICD-10-CM

## 2011-11-13 DIAGNOSIS — M25559 Pain in unspecified hip: Secondary | ICD-10-CM

## 2011-11-13 DIAGNOSIS — R5383 Other fatigue: Secondary | ICD-10-CM

## 2011-11-13 DIAGNOSIS — R5381 Other malaise: Secondary | ICD-10-CM

## 2011-11-13 DIAGNOSIS — IMO0001 Reserved for inherently not codable concepts without codable children: Secondary | ICD-10-CM

## 2011-11-13 DIAGNOSIS — M25562 Pain in left knee: Secondary | ICD-10-CM

## 2011-11-13 DIAGNOSIS — M791 Myalgia, unspecified site: Secondary | ICD-10-CM

## 2011-11-13 LAB — COMPREHENSIVE METABOLIC PANEL
ALT: 15 U/L (ref 0–35)
AST: 23 U/L (ref 0–37)
BUN: 15 mg/dL (ref 6–23)
Calcium: 9.5 mg/dL (ref 8.4–10.5)
Creat: 0.81 mg/dL (ref 0.50–1.10)
Total Bilirubin: 0.4 mg/dL (ref 0.3–1.2)

## 2011-11-13 LAB — CBC WITH DIFFERENTIAL/PLATELET
Basophils Absolute: 0 10*3/uL (ref 0.0–0.1)
Eosinophils Absolute: 0.2 10*3/uL (ref 0.0–0.7)
Eosinophils Relative: 4 % (ref 0–5)
HCT: 38.3 % (ref 36.0–46.0)
MCH: 29.6 pg (ref 26.0–34.0)
MCHC: 31.6 g/dL (ref 30.0–36.0)
MCV: 93.6 fL (ref 78.0–100.0)
Monocytes Absolute: 0.4 10*3/uL (ref 0.1–1.0)
Platelets: 280 10*3/uL (ref 150–400)
RDW: 13.8 % (ref 11.5–15.5)

## 2011-11-13 NOTE — Progress Notes (Signed)
  Subjective:    Patient ID: Lisa Waters, female    DOB: January 15, 1981, 31 y.o.   MRN: [id number]  HPI She has a one-week history of dizziness, arm and joint as well as leg aching, fatigue, burning sensation in chest. She also complains of a tingling sensation in her fingers and toes. No sore throat, cough, earache, congestion   Review of Systems     Objective:   Physical Exam alert and in no distress. Tympanic membranes and canals are normal. Throat is clear. Tonsils are normal. Neck is supple without adenopathy or thyromegaly. Cardiac exam shows a regular sinus rhythm without murmurs or gallops. Lungs are clear to auscultation. Funduscopic exam normal. DTRs are 2-3+ and symmetric        Assessment & Plan:  Etiology of her symptoms is unclear. Blood work will be drawn. May possibly need to be referred for further evaluation.

## 2011-11-14 LAB — SEDIMENTATION RATE: Sed Rate: 1 mm/hr (ref 0–22)

## 2011-11-29 ENCOUNTER — Encounter: Payer: Self-pay | Admitting: Cardiology

## 2012-01-29 ENCOUNTER — Telehealth: Payer: Self-pay | Admitting: Family Medicine

## 2012-01-29 NOTE — Telephone Encounter (Signed)
PT CALLED AND STATED THAT HER ARM IS NOT ANY BETTER. SHE STATES THAT IT IS WORSE. PT WOULD LIKE TO KNOW WHAT NOW. PLEASE CALL. PT USES [facility] ON [address].

## 2012-01-29 NOTE — Telephone Encounter (Signed)
Pt has appt thursday

## 2012-01-29 NOTE — Telephone Encounter (Signed)
Have her schedule an appointment for followup

## 2012-02-01 ENCOUNTER — Ambulatory Visit (INDEPENDENT_AMBULATORY_CARE_PROVIDER_SITE_OTHER): Payer: Managed Care, Other (non HMO) | Admitting: Family Medicine

## 2012-02-01 ENCOUNTER — Encounter: Payer: Self-pay | Admitting: Family Medicine

## 2012-02-01 VITALS — BP 110/70 | HR 68 | Ht 67.0 in | Wt 137.0 lb

## 2012-02-01 DIAGNOSIS — M7551 Bursitis of right shoulder: Secondary | ICD-10-CM

## 2012-02-01 DIAGNOSIS — M67919 Unspecified disorder of synovium and tendon, unspecified shoulder: Secondary | ICD-10-CM

## 2012-02-01 DIAGNOSIS — M719 Bursopathy, unspecified: Secondary | ICD-10-CM

## 2012-02-01 NOTE — Progress Notes (Signed)
  Subjective:    Patient ID: Lisa Waters, female    DOB: 1981-04-03, 31 y.o.   MRN: [id number]  HPI She is here for consult concerning her right shoulder pain. She was seen in April and an injection was given. She obtained relief for roughly 2 months however in the last month she has noted the return of pain with abduction and external rotation. She also notes increased pain when she falls asleep at night. She also is noticing a slight tingling sensation in her shoulder when she is writing with her right hand.   Review of Systems     Objective:   Physical Exam Full motion of the shoulder. Neer's and Hawkin's test was uncomfortable. Supraspinatus testing normal. No crepitus noted.       Assessment & Plan:   1. Bursitis of right shoulder    after discussion with her, we have decided to try another injection. 40 mg of Kenalog and 3 cc of Xylocaine was injected into the subacromial bursa after prepping the skin with Betadine. She tolerated the procedure well. If she has difficulty with this within the next several months, further evaluation of the shoulder will be needed.

## 2012-02-01 NOTE — Patient Instructions (Signed)
If this either doesn't work all are only lasts for several months call me

## 2012-03-21 ENCOUNTER — Encounter: Payer: Self-pay | Admitting: Family Medicine

## 2012-03-21 ENCOUNTER — Ambulatory Visit (INDEPENDENT_AMBULATORY_CARE_PROVIDER_SITE_OTHER): Payer: Managed Care, Other (non HMO) | Admitting: Family Medicine

## 2012-03-21 VITALS — BP 110/70 | HR 102 | Wt 139.0 lb

## 2012-03-21 DIAGNOSIS — H53419 Scotoma involving central area, unspecified eye: Secondary | ICD-10-CM

## 2012-03-21 NOTE — Progress Notes (Signed)
  Subjective:    Patient ID: Lisa Waters, female    DOB: [birth year], 31 y.o.   MRN: [id number]  HPI Yesterday she experienced blindness in the right eye. She describes difficulty with central vision. She did see optometrist. She also complains of some dizziness as well as slight hoarse voice area no fever, chills, sore throat, earache, cough or congestion. She has a previous history of Hodgkin's disease her last chest x-ray was in 2012. She has had an MRI of her head which was negative.   Review of Systems     Objective:   Physical Exam alert and in no distress. EOMI. Normal light reflex in the eyes. Cerebellar testing normal. DTRs normal. Negative clonus.Tympanic membranes and canals are normal. Throat is clear. Tonsils are normal. Neck is supple without adenopathy or thyromegaly. Cardiac exam shows a regular sinus rhythm without murmurs or gallops. Lungs are clear to auscultation.        Assessment & Plan:   1. Central loss of vision  Ambulatory referral to Ophthalmology

## 2012-03-25 ENCOUNTER — Telehealth: Payer: Self-pay | Admitting: Internal Medicine

## 2012-03-25 MED ORDER — OMEPRAZOLE 40 MG PO CPDR: 40.0000 mg | DELAYED_RELEASE_CAPSULE | Freq: Every day | ORAL | Status: DC

## 2012-03-25 NOTE — Telephone Encounter (Signed)
Omeprazole sent in   

## 2012-04-09 DIAGNOSIS — N978 Female infertility of other origin: Secondary | ICD-10-CM | POA: Insufficient documentation

## 2012-05-08 ENCOUNTER — Telehealth: Payer: Self-pay | Admitting: Internal Medicine

## 2012-05-08 ENCOUNTER — Other Ambulatory Visit (HOSPITAL_COMMUNITY): Payer: Self-pay | Admitting: Cardiology

## 2012-05-08 DIAGNOSIS — G453 Amaurosis fugax: Secondary | ICD-10-CM

## 2012-05-08 MED ORDER — SERTRALINE HCL 50 MG PO TABS: 50.0000 mg | ORAL_TABLET | Freq: Every day | ORAL | Status: DC

## 2012-05-08 NOTE — Telephone Encounter (Signed)
Zoloft renewed.

## 2012-05-20 ENCOUNTER — Ambulatory Visit (INDEPENDENT_AMBULATORY_CARE_PROVIDER_SITE_OTHER): Payer: Managed Care, Other (non HMO) | Admitting: Family Medicine

## 2012-05-20 VITALS — BP 110/70 | HR 85 | Wt 138.0 lb

## 2012-05-20 DIAGNOSIS — M25519 Pain in unspecified shoulder: Secondary | ICD-10-CM

## 2012-05-20 DIAGNOSIS — G453 Amaurosis fugax: Secondary | ICD-10-CM

## 2012-05-20 DIAGNOSIS — H3400 Transient retinal artery occlusion, unspecified eye: Secondary | ICD-10-CM

## 2012-05-20 DIAGNOSIS — M25511 Pain in right shoulder: Secondary | ICD-10-CM

## 2012-05-20 DIAGNOSIS — Z23 Encounter for immunization: Secondary | ICD-10-CM

## 2012-05-20 NOTE — Progress Notes (Signed)
  Subjective:    Patient ID: Lisa Waters, female    DOB: 1981/01/09, 31 y.o.   MRN: [id number]  HPI She is here for recheck on right shoulder pain. Review his record indicates she has had 2 injections, neither of which have helped. She notes increased pain with abduction, internal and external rotation She also is set up to have CT angiogram to evaluate for cause of amaurosis fugax.  Review of Systems     Objective:   Physical Exam Alert and in no distress. Pain on abduction and external as well as internal rotation. Juanetta Gosling' and Neer's  test is positive. No point tenderness is noted. Negative sulcus sign.       Assessment & Plan:   1. Right shoulder pain  MR Shoulder Right Wo Contrast  2. Amaurosis fugax of right eye     she is scheduled to see neurology in the near future. I will discuss with the neurologist whether CT or MRI is the appropriate next diagnostic procedure.

## 2012-05-21 ENCOUNTER — Ambulatory Visit (HOSPITAL_COMMUNITY)
Admission: RE | Admit: 2012-05-21 | Discharge: 2012-05-21 | Disposition: A | Discharge status: Home / Self Care | Payer: Managed Care, Other (non HMO) | Source: Ambulatory Visit | Attending: Cardiology | Admitting: Cardiology

## 2012-05-21 ENCOUNTER — Ambulatory Visit (HOSPITAL_COMMUNITY): Payer: Managed Care, Other (non HMO)

## 2012-05-21 DIAGNOSIS — G453 Amaurosis fugax: Secondary | ICD-10-CM

## 2012-05-21 DIAGNOSIS — H3400 Transient retinal artery occlusion, unspecified eye: Secondary | ICD-10-CM | POA: Insufficient documentation

## 2012-05-21 MED ORDER — IOHEXOL 350 MG/ML SOLN
50.0000 mL | Freq: Once | INTRAVENOUS | Status: AC | PRN
  Administered 2012-05-21: 50 mL via INTRAVENOUS

## 2012-05-23 ENCOUNTER — Ambulatory Visit
Admission: RE | Admit: 2012-05-23 | Discharge: 2012-05-23 | Disposition: A | Discharge status: Home / Self Care | Payer: Managed Care, Other (non HMO) | Source: Ambulatory Visit | Attending: Family Medicine | Admitting: Family Medicine

## 2012-05-23 DIAGNOSIS — M25511 Pain in right shoulder: Secondary | ICD-10-CM

## 2012-05-27 NOTE — Progress Notes (Signed)
Quick Note:  Let her know that the MRI did not show anything torn but did show some tendon damage. Let her know that we can refer her back to orthopedics or potentially give her another injection however if she still has difficulty after this injection than referral would be definitely necessary ______

## 2012-05-27 NOTE — Progress Notes (Signed)
Quick Note:  Called pt cell# left message to call me back ______ 

## 2012-05-29 ENCOUNTER — Other Ambulatory Visit: Payer: Self-pay | Admitting: Family Medicine

## 2012-06-25 ENCOUNTER — Telehealth: Payer: Self-pay | Admitting: Family Medicine

## 2012-06-25 MED ORDER — OMEPRAZOLE 40 MG PO CPDR: 40.0000 mg | DELAYED_RELEASE_CAPSULE | Freq: Every day | ORAL | Status: DC

## 2012-06-25 NOTE — Telephone Encounter (Signed)
done

## 2012-07-05 ENCOUNTER — Telehealth: Payer: Self-pay | Admitting: Family Medicine

## 2012-07-08 ENCOUNTER — Telehealth: Payer: Self-pay | Admitting: Family Medicine

## 2012-07-08 MED ORDER — SERTRALINE HCL 50 MG PO TABS: 50.0000 mg | ORAL_TABLET | Freq: Every day | ORAL | Status: DC

## 2012-07-08 NOTE — Telephone Encounter (Signed)
Sent in 90 days rx for zoloft

## 2012-07-15 ENCOUNTER — Telehealth: Payer: Self-pay | Admitting: Family Medicine

## 2012-07-15 NOTE — Telephone Encounter (Signed)
LM

## 2012-07-18 NOTE — Telephone Encounter (Signed)
LM

## 2012-08-02 ENCOUNTER — Other Ambulatory Visit: Payer: Self-pay

## 2012-08-02 MED ORDER — OMEPRAZOLE 40 MG PO CPDR: 40.0000 mg | DELAYED_RELEASE_CAPSULE | Freq: Every day | ORAL | Status: DC

## 2012-08-30 ENCOUNTER — Encounter: Payer: Self-pay | Admitting: Family Medicine

## 2012-08-30 ENCOUNTER — Ambulatory Visit: Payer: Managed Care, Other (non HMO) | Admitting: Family Medicine

## 2012-08-30 ENCOUNTER — Ambulatory Visit (INDEPENDENT_AMBULATORY_CARE_PROVIDER_SITE_OTHER): Payer: Managed Care, Other (non HMO) | Admitting: Family Medicine

## 2012-08-30 VITALS — BP 120/74 | HR 68 | Temp 98.0°F | Ht 66.5 in | Wt 143.0 lb

## 2012-08-30 DIAGNOSIS — R05 Cough: Secondary | ICD-10-CM

## 2012-08-30 DIAGNOSIS — R0600 Dyspnea, unspecified: Secondary | ICD-10-CM

## 2012-08-30 DIAGNOSIS — R0689 Other abnormalities of breathing: Secondary | ICD-10-CM

## 2012-08-30 DIAGNOSIS — J209 Acute bronchitis, unspecified: Secondary | ICD-10-CM

## 2012-08-30 DIAGNOSIS — R0989 Other specified symptoms and signs involving the circulatory and respiratory systems: Secondary | ICD-10-CM

## 2012-08-30 DIAGNOSIS — R0609 Other forms of dyspnea: Secondary | ICD-10-CM

## 2012-08-30 DIAGNOSIS — R059 Cough, unspecified: Secondary | ICD-10-CM

## 2012-08-30 MED ORDER — ALBUTEROL SULFATE HFA 108 (90 BASE) MCG/ACT IN AERS: 2.0000 | INHALATION_SPRAY | Freq: Four times a day (QID) | RESPIRATORY_TRACT | Status: DC | PRN

## 2012-08-30 MED ORDER — HYDROCOD POLST-CHLORPHEN POLST 10-8 MG/5ML PO LQCR: 5.0000 mL | Freq: Every evening | ORAL | Status: DC | PRN

## 2012-08-30 MED ORDER — AZITHROMYCIN 250 MG PO TABS: ORAL_TABLET | ORAL | Status: DC

## 2012-08-30 NOTE — Progress Notes (Signed)
Chief Complaint  Patient presents with  . possible bronchitis    possible bronchitis, been going on since tuesday. hard to breath. having blood or green stuff come up. congested and coughing. laying down is worse   HPI:  Started 10 days ago with tightness in her chest, slight cough, progressed to a full cold, which improved with OTC meds.  Yesterday she awoke with chest tightness, shortness of breath.  Hard to lie down to sleep due to pressure in her chest--felt better sitting up.  Coughing up green phlegm, sometimes with streaks of blood.  Has mild runny nose, ongoing sinus issues x 1 month--bloody and green in the mornings (mostly, clears up throughout the day). +PND, and sometimes gags with coughing.  Some nausea, and diarrhea x 2 days. Denies sinus pain.  Some chills, but no known fevers.  +sick contact (husband with URI, brother-in-law with MRSA abscess).  Received flu shot this year.  Used dayquil, nyquil and mucinex last week, and restarted these meds yesterday.  Has used albuterol in the past--when diagnosed with lymphoma she had pneumonia, and required inhalers for a while.  Hasn't had problems since, and her breathing has been better since quitting smoking in 2011. Denies leg swelling or pain  Past Medical History  Diagnosis Date  . History of hodgkin's lymphoma 2007    in remission, sees [facility], Dr. Greggory Stallion  . Hypothyroidism   . Depression   . Anxiety   . GERD (gastroesophageal reflux disease)   . MVP (mitral valve prolapse)     Dr. Herbie Baltimore  . Migraine    Past Surgical History  Procedure Laterality Date  . Appendectomy    . Breast enhancement surgery    . Rhinoplasty  deviated septum  . Lymphnode biopsy     History   Social History  . Marital Status: Married    Spouse Name: N/A    Number of Children: N/A  . Years of Education: N/A   Occupational History  . works in [employer]   Social History Main Topics  . Smoking status: Former Smoker -- 1.00 packs/day for 10  years    Types: Cigarettes    Quit date: 10/15/2009  . Smokeless tobacco: Never Used  . Alcohol Use: No  . Drug Use: No  . Sexually Active: Yes -- Female partner(s)    Birth Control/ Protection: None   Other Topics Concern  . Not on file   Social History Narrative  . No narrative on file   Current outpatient prescriptions:aspirin 81 MG tablet, Take 81 mg by mouth daily., Disp: , Rfl: ;  levothyroxine (SYNTHROID) 88 MCG tablet, Take 88 mcg by mouth daily.  , Disp: , Rfl: ;  omeprazole (PRILOSEC) 40 MG capsule, Take 1 capsule (40 mg total) by mouth daily., Disp: 90 capsule, Rfl: 4;  pindolol (VISKEN) 5 MG tablet, Take 5 mg by mouth 2 (two) times daily.  , Disp: , Rfl:  sertraline (ZOLOFT) 50 MG tablet, Take 1 tablet (50 mg total) by mouth daily., Disp: 90 tablet, Rfl: 1;  SUMAtriptan (IMITREX) 100 MG tablet, Take 1 tablet (100 mg total) by mouth every 2 (two) hours as needed for migraine. Not to exceed more than 2 tablets per 24 hours, Disp: 10 tablet, Rfl: 1  Allergies  Allergen Reactions  . Codeine Hives   ROS:  +canker sores intermittently since October.  No fevers, +nausea and some diarrhea (yesterday and this morning).  No vomiting.  Denies skin rashes, bleeding/bruising.  +  chest pressure, feels like her resting heart rate is a little faster, harder--no irregular rhythm or tachycardia, just different from her baseline.   PHYSICAL EXAM: BP 120/74  Pulse 68  Temp(Src) 98 F (36.7 C) (Oral)  Ht 5' 6.5" (1.689 m)  Wt 143 lb (64.864 kg)  BMI 22.74 kg/m2  Well developed, pleasant female, speaking easily, in no distress.  Rare dry cough HEENT:  PERRL, conjunctiva clear.  TM's and EAC's normal. Nasal mucosa mildly edematous, no erythema, clear mucus. Sinuses nontender.  OP clear Neck: no lymphadenopathy or mass Heart: regular rate and rhythm, no murmur or ectopy noted Lungs: clear bilaterally with good air movement.  No wheezes or cough with forced expiration. Also clear, with improved  air movement, after nebulizer treatment Extremities: no edema Skin: no rash/lesions Psych: normal mood, affect, hygiene and grooming Neuro: alert and oriented, cranial nerves intact.  Normal gait, strength  Peak flow 370/445/420 After albuterol nebulizer treatment--chest felt improved, less tight. Peak flow after: 400/420//445   ASSESSMENT/PLAN:  Acute bronchitis - Plan: azithromycin (ZITHROMAX) 250 MG tablet  Dyspnea and respiratory abnormality - Plan: albuterol (PROVENTIL HFA;VENTOLIN HFA) 108 (90 BASE) MCG/ACT inhaler, PR INHAL RX, AIRWAY OBST/DX SPUTUM INDUCT, PR ALBUTEROL IPRATROP NON-COMP, PR INHAL RX, AIRWAY OBST/DX SPUTUM INDUCT  Cough - Plan: chlorpheniramine-HYDROcodone (TUSSIONEX PENNKINETIC ER) 10-8 MG/5ML LQCR   Subjectively improved after nebulizer treatment.  No significant improvement in peak flow noted.  Treat for bronchitis, and albuterol prn.  Continue mucinex and other supportive measures (decongestants as needed, etc).  Pt is asking for cough suppressant to help her at nighttime.  DM wasn't effective.  Allergic to codeine, but has tolerated hydrocodone in the past without problems.  If symptoms not improving, will need further eval, including CXR

## 2012-08-30 NOTE — Patient Instructions (Signed)
Drink plenty of fluids.  Continue mucinex (plain or DM). You can continue decongestants as needed Take the Z-pak as directed.  Call in 5-7 days if symptoms worsen (for change in antibiotics, and possibly getting x-ray), or call on day 10 if you aren't completely better (to extend course of antibiotics)  Use the cough syrup at bedtime--it can last for 12 hours, so be careful with driving.  It has hydrocodone which you state you tolerate. If you have any allergic reaction, stop immediately

## 2012-09-02 ENCOUNTER — Encounter: Payer: Self-pay | Admitting: Family Medicine

## 2012-09-02 ENCOUNTER — Ambulatory Visit (INDEPENDENT_AMBULATORY_CARE_PROVIDER_SITE_OTHER): Payer: Managed Care, Other (non HMO) | Admitting: Family Medicine

## 2012-09-02 VITALS — BP 116/70 | HR 99 | Temp 98.7°F | Wt 137.0 lb

## 2012-09-02 DIAGNOSIS — J209 Acute bronchitis, unspecified: Secondary | ICD-10-CM

## 2012-09-02 MED ORDER — AMOXICILLIN-POT CLAVULANATE 875-125 MG PO TABS: 1.0000 | ORAL_TABLET | Freq: Two times a day (BID) | ORAL | Status: AC

## 2012-09-02 NOTE — Progress Notes (Signed)
  Subjective:    Patient ID: Lisa Waters, female    DOB: [birth year], 32 y.o.   MRN: [id number]  HPI 2 weeks ago she started having difficulty with chest congestion and did get somewhat better however 4 days ago she was seen here and given a breathing treatment and placed on azithromycin.she is now complaining of more chest and sinus congestion, she developed a fever last night.   Review of Systems     Objective:   Physical Exam alert and in no distress. Tympanic membranes and canals are normal. Throat is clear. Tonsils are normal. Neck is supple without adenopathy or thyromegaly. Cardiac exam shows a regular sinus rhythm without murmurs or gallops. Lungs are clear to auscultation. Chest x-ray shows no acute changes       Assessment & Plan:  Acute bronchitis - Plan: amoxicillin-clavulanate (AUGMENTIN) 875-125 MG per tablet continue supportive care. She is to call in several days if no improvement.

## 2012-09-02 NOTE — Patient Instructions (Signed)
Use Tylenol and you can mix it with either Advil or Aleve for fever aches and pains. Start on the Augmentin. You can use Afrin nasal spray at night to help with the nose

## 2012-09-04 ENCOUNTER — Telehealth: Payer: Self-pay | Admitting: Family Medicine

## 2012-09-04 NOTE — Telephone Encounter (Signed)
Patient advised of Dr.Knapp's recommendations. She will try the probiotics and imodium and call back to change medication if these measures do not help.

## 2012-09-04 NOTE — Telephone Encounter (Signed)
Advise--diarrhea is known/common side effect of Augmentin.  She can take probiotics and imodium prn to help with diarrhea.  Can stop after a week (rather than full 10 days) if symptoms have completely resolved by then.  If diarrhea still terrible despite these measures, will need to change to Levaquin 500mg  daily x 7 days.

## 2012-10-07 ENCOUNTER — Ambulatory Visit: Payer: Managed Care, Other (non HMO) | Admitting: Medical

## 2012-10-07 ENCOUNTER — Encounter: Payer: Self-pay | Admitting: Medical

## 2012-10-07 ENCOUNTER — Ambulatory Visit (INDEPENDENT_AMBULATORY_CARE_PROVIDER_SITE_OTHER): Payer: Managed Care, Other (non HMO) | Admitting: Medical

## 2012-10-07 ENCOUNTER — Ambulatory Visit
Admission: RE | Admit: 2012-10-07 | Discharge: 2012-10-07 | Disposition: A | Discharge status: Home / Self Care | Payer: Managed Care, Other (non HMO) | Source: Ambulatory Visit | Attending: Medical | Admitting: Medical

## 2012-10-07 VITALS — BP 110/70 | HR 76 | Temp 98.3°F | Resp 18 | Wt 140.0 lb

## 2012-10-07 DIAGNOSIS — R0602 Shortness of breath: Secondary | ICD-10-CM

## 2012-10-07 DIAGNOSIS — E039 Hypothyroidism, unspecified: Secondary | ICD-10-CM

## 2012-10-07 DIAGNOSIS — I059 Rheumatic mitral valve disease, unspecified: Secondary | ICD-10-CM

## 2012-10-07 DIAGNOSIS — I341 Nonrheumatic mitral (valve) prolapse: Secondary | ICD-10-CM

## 2012-10-07 DIAGNOSIS — R079 Chest pain, unspecified: Secondary | ICD-10-CM

## 2012-10-07 LAB — CBC WITH DIFFERENTIAL/PLATELET
Hemoglobin: 13.3 g/dL (ref 12.0–15.0)
Lymphs Abs: 1.4 10*3/uL (ref 0.7–4.0)
Monocytes Relative: 5 % (ref 3–12)
Neutro Abs: 3.9 10*3/uL (ref 1.7–7.7)
Neutrophils Relative %: 65 % (ref 43–77)
Platelets: 264 10*3/uL (ref 150–400)
RBC: 4.32 MIL/uL (ref 3.87–5.11)
WBC: 5.9 10*3/uL (ref 4.0–10.5)

## 2012-10-07 LAB — T4, FREE: Free T4: 1.15 ng/dL (ref 0.80–1.80)

## 2012-10-07 LAB — TSH: TSH: 1.144 u[IU]/mL (ref 0.350–4.500)

## 2012-10-07 LAB — BASIC METABOLIC PANEL
Calcium: 9.3 mg/dL (ref 8.4–10.5)
Creat: 0.77 mg/dL (ref 0.50–1.10)

## 2012-10-07 LAB — BRAIN NATRIURETIC PEPTIDE: Brain Natriuretic Peptide: 90.8 pg/mL (ref 0.0–100.0)

## 2012-10-07 NOTE — Progress Notes (Signed)
Subjective: Here for swelling in legs and SOB.  She has hx/o Hodgkins lymphoma in remission, hx/o chest radiation, MVP, hypothyroidism.  Saturday 2 days ago started having some swelling in her feet.  Since then heart feels like it is shaking or in a washing machine.  At times harder to catch her breath over the last few days.  Extremely tired all day yesterday.  Occasionally has sharp pain in chest and that goes away quickly, but feels like an earthquake.  These chest pains last for a few seconds, brief, but have been more frequent the last few days.   In general has had intermittent chest pain in the past over last few months.  Similar gets SOB from time to time.    She was seen here for bronchitis a month ago and this resolved.  No recent heartburn, acid reflux, belching. She is a former smoker.  No recent surgery, procedure, or long travel.  Just saw hematology at the end of 2013, hodgkin's in remission.   Last cardiology late 2013 as well.  Past Medical History  Diagnosis Date  . History of hodgkin's lymphoma 2007    in remission, sees [facility], Dr. Greggory Stallion  . Hypothyroidism   . Depression   . Anxiety   . GERD (gastroesophageal reflux disease)   . MVP (mitral valve prolapse)     Dr. Herbie Baltimore  . Migraine     ROS Gen: no recent fever, no chills, no sweats, +fatigue, no recent weight changes Skin: itching on feet, but no rash or other skin changes Heent: always has runny nose, but no sore throat, no allergy symptoms. Heart: intermittent brief chest pain, no palpitations, no skipped beats, no syncope, no radiating pain, all located central to left chest.   Lungs: SOB x 1 days. GI: no pain, no NVD Neuro: brief feeling of dizziness, takes a minute to get bearings, no vertigo, no syncope, +some tingling in all fingers intermittent, likes pin prick feeling.  No numbness or weakness.    Objective: Filed Vitals:   10/07/12 1126  BP: 110/70  Pulse: 76  Temp: 98.3 F (36.8 C)  Resp: 18     General appearance: alert, WD/WN, seems a little anxious today Neck: supple, no lymphadenopathy, no thyromegaly, no masses, no obvious JVP Heart: 2/6 systolic murmur heard best in left lower sternal border and left axilla to some extent, otherwise RRR, no obvious friction rub Lungs: CTA bilaterally, no wheezes, rhonchi, or rales Abdomen: +bs, soft, non tender, no obvious hepatojugular reflux, non distended, no masses, no hepatomegaly, no splenomegaly Pulses: 2+ symmetric, upper and lower extremities, normal cap refill Ext: no obvious edema MSK: nontender calves and legs, no palpable cord   Adult ECG Report  Indication: chest pain  Rate: 85 bpm  Rhythm: normal sinus rhythm  QRS Axis: 80 degrees  PR Interval: 146 ms  QRS Duration: 80 ms  QTc: 459 ms     Conduction Disturbances: none  Other Abnormalities: none  Patient's cardiac risk factors are: hx/o MVP.  EKG comparison: 11/2010.  V3 different than prior, but may be lead placement issue.  No acute changes  Narrative Interpretation: normal sinus rhythm, no acute change, no obvious worrisome finding    Assessment: Encounter Diagnoses  Name Primary?  . Chest pain Yes  . Shortness of breath   . MVP (mitral valve prolapse)   . Unspecified hypothyroidism     Plan: Discussed case with Dr. Susann Givens, supervising physician.  EKG with no obvious infarct or ischemia,  no obvious pericarditis.  Etiology unclear, could be potential worsening of MVP symptoms.   Reviewed last 04/2012 cardiology notes from Dr. Herbie Baltimore.  She has a complex medical history, just recently saw neurology for transient vision loss, had MRI brain per neurology.     She has hx/o MVP, due back currently with cardiology for yearly f/u, and per last cardiology note, echocardiogram is planned for this year.  Discussed possible etiologies with her.   EKG reviewed.  Will send for CXR and stat labs today.  If worse, call, return or go to the ED.  Follow-up pending studies.

## 2012-10-09 ENCOUNTER — Other Ambulatory Visit (HOSPITAL_COMMUNITY): Payer: Self-pay | Admitting: Cardiology

## 2012-10-09 DIAGNOSIS — R0602 Shortness of breath: Secondary | ICD-10-CM

## 2012-10-09 DIAGNOSIS — I340 Nonrheumatic mitral (valve) insufficiency: Secondary | ICD-10-CM

## 2012-10-09 DIAGNOSIS — I341 Nonrheumatic mitral (valve) prolapse: Secondary | ICD-10-CM

## 2012-10-09 DIAGNOSIS — R079 Chest pain, unspecified: Secondary | ICD-10-CM

## 2012-10-10 ENCOUNTER — Ambulatory Visit (HOSPITAL_COMMUNITY)
Admission: RE | Admit: 2012-10-10 | Discharge: 2012-10-10 | Disposition: A | Discharge status: Home / Self Care | Payer: Managed Care, Other (non HMO) | Source: Ambulatory Visit | Attending: Cardiology | Admitting: Cardiology

## 2012-10-10 DIAGNOSIS — R079 Chest pain, unspecified: Secondary | ICD-10-CM

## 2012-10-10 DIAGNOSIS — I341 Nonrheumatic mitral (valve) prolapse: Secondary | ICD-10-CM

## 2012-10-10 DIAGNOSIS — I340 Nonrheumatic mitral (valve) insufficiency: Secondary | ICD-10-CM

## 2012-10-10 DIAGNOSIS — I059 Rheumatic mitral valve disease, unspecified: Secondary | ICD-10-CM | POA: Insufficient documentation

## 2012-10-10 DIAGNOSIS — R0602 Shortness of breath: Secondary | ICD-10-CM

## 2012-10-10 NOTE — Progress Notes (Signed)
2D Echo Performed 10/10/2012    Harlis Champoux, RCS  

## 2012-10-11 ENCOUNTER — Ambulatory Visit (INDEPENDENT_AMBULATORY_CARE_PROVIDER_SITE_OTHER): Payer: Managed Care, Other (non HMO) | Admitting: Medical

## 2012-10-11 ENCOUNTER — Ambulatory Visit (INDEPENDENT_AMBULATORY_CARE_PROVIDER_SITE_OTHER): Payer: Managed Care, Other (non HMO) | Admitting: Internal Medicine

## 2012-10-11 ENCOUNTER — Ambulatory Visit: Payer: Self-pay | Admitting: Medical

## 2012-10-11 ENCOUNTER — Telehealth: Payer: Self-pay | Admitting: Family Medicine

## 2012-10-11 ENCOUNTER — Encounter: Payer: Self-pay | Admitting: Medical

## 2012-10-11 VITALS — BP 120/80 | HR 68 | Temp 98.4°F | Resp 18 | Wt 138.0 lb

## 2012-10-11 DIAGNOSIS — I341 Nonrheumatic mitral (valve) prolapse: Secondary | ICD-10-CM

## 2012-10-11 DIAGNOSIS — J449 Chronic obstructive pulmonary disease, unspecified: Secondary | ICD-10-CM

## 2012-10-11 DIAGNOSIS — R0602 Shortness of breath: Secondary | ICD-10-CM

## 2012-10-11 DIAGNOSIS — I059 Rheumatic mitral valve disease, unspecified: Secondary | ICD-10-CM

## 2012-10-11 DIAGNOSIS — F341 Dysthymic disorder: Secondary | ICD-10-CM

## 2012-10-11 DIAGNOSIS — F418 Other specified anxiety disorders: Secondary | ICD-10-CM

## 2012-10-11 DIAGNOSIS — I340 Nonrheumatic mitral (valve) insufficiency: Secondary | ICD-10-CM

## 2012-10-11 LAB — PULMONARY FUNCTION TEST

## 2012-10-11 MED ORDER — SERTRALINE HCL 100 MG PO TABS: 100.0000 mg | ORAL_TABLET | Freq: Every day | ORAL | Status: DC

## 2012-10-11 MED ORDER — ALPRAZOLAM 0.5 MG PO TBDP: 0.5000 mg | ORAL_TABLET | Freq: Two times a day (BID) | ORAL | Status: DC | PRN

## 2012-10-11 NOTE — Telephone Encounter (Signed)
Patient is aware of her appointment to have PFT on 10/11/12 @ 1200 pm at [facility]. CLS

## 2012-10-11 NOTE — Progress Notes (Signed)
Subjective: Here for f/u.  She saw me 5 days ago for same symptoms.  Went to Cardiology on Tuesday for the same, had echocardiogram yesterday. Not sure of results yet.  Cardiology started her on Lasix and K-Dur.  She reports 10 pack year hx/o tobacco use but currently does not smoke . Quit 2 years ago.   She has hx/o hodgkins lymphoma, hx/o chemotherapy and chest radiation therapy.  Last oncology f/u last year.   Here to review the abnormal CXR done Monday.  Here for swelling in legs and SOB.  She has hx/o Hodgkins lymphoma in remission, hx/o chest radiation, MVP, hypothyroidism.  About a week ago started having some swelling in her feet.  Since then heart feels like it is shaking or in a washing machine.  At times harder to catch her breath over the last few days.  Extremely tired all day yesterday.  Occasionally has sharp pain in chest and that goes away quickly, but feels like an earthquake.  These chest pains last for a few seconds, brief, but have been more frequent the last few days.   In general has had intermittent chest pain in the past over last few months.  Similar gets SOB from time to time.    She was seen here for bronchitis a month ago and this resolved.  No recent heartburn, acid reflux, belching. She is a former smoker.  No recent surgery, procedure, or long travel.    Past Medical History  Diagnosis Date  . History of hodgkin's lymphoma 2007    in remission, sees [facility], Dr. Greggory Stallion  . Hypothyroidism   . Depression   . Anxiety   . GERD (gastroesophageal reflux disease)   . MVP (mitral valve prolapse)     Dr. Herbie Baltimore  . Migraine     ROS Gen: no recent fever, no chills, no sweats, +fatigue, no recent weight changes Skin: itching on feet, but no rash or other skin changes Heent: always has runny nose, but no sore throat, no allergy symptoms. Heart: intermittent brief chest pain, no palpitations, no skipped beats, no syncope, no radiating pain, all located central to left chest.    Lungs: SOB x 1 week GI: no pain, no NVD Neuro: brief feeling of dizziness, takes a minute to get bearings, no vertigo, no syncope, +some tingling in all fingers intermittent, likes pin prick feeling.  No numbness or weakness.    Objective: Filed Vitals:   10/11/12 0938  BP: 120/80  Pulse: 68  Temp: 98.4 F (36.9 C)  Resp: 18    General appearance: alert, WD/WN, seems anxious and tremulous at times, crying and upset today Neck: supple, no lymphadenopathy, no thyromegaly, no masses, no obvious JVP Heart: 2-3/6 systolic murmur heard best in left lower sternal border and left axilla, otherwise RRR, no obvious friction rub Lungs: CTA bilaterally, no wheezes, rhonchi, or rales Pulses: 2+ symmetric, upper and lower extremities, normal cap refill Ext: no obvious edema MSK: nontender calves and legs, no palpable cord    Assessment: Encounter Diagnoses  Name Primary?  . MVP (mitral valve prolapse) Yes  . Mitral regurgitation   . COPD (chronic obstructive pulmonary disease)   . SOB (shortness of breath)   . Depression with anxiety     Plan: MVP, mitral regurge - I spoke to cardiology, Dr. Elissa Hefty office.  We all agree that her symptoms seem related to her worsened MVP and mitral regurge.  I increased her Lasix to 40mg  daily and potassium to  daily at their request, and she has f/u with them on Tuesday of next week.  Echocardiogram also supports worsened regurgitation.  She will go to the ED if worse over the weekend.  COPD - new diagnosis today.  The recent CXR shows hyperinflation and signs suggestive of COPD.  Given her age and prior tobacco history, we will set up PFTs and alpha 1 antitrypsin lab.   I really doubt her dyspnea is related to COPD though.  Symptoms more suggestive of cardiac source.  Pending PFTs, if moderate or worse COPD, will begin medication. She has a rescue inhaler for the recent bronchitis, and it currently hasn't helped her SOB symptoms.  Depression with  anxiety - increased Zoloft to 100mg , added short term Xanax to help her anxiety.  discussed her concerns.   F/u with cardiology on Tuesday.

## 2012-10-11 NOTE — Progress Notes (Signed)
PFT done today. 

## 2012-10-12 ENCOUNTER — Encounter: Payer: Self-pay | Admitting: Medical

## 2012-10-12 LAB — ALPHA-1-ANTITRYPSIN: A-1 Antitrypsin, Ser: 131 mg/dL (ref 90–200)

## 2012-10-15 ENCOUNTER — Telehealth: Payer: Self-pay | Admitting: Family Medicine

## 2012-10-15 ENCOUNTER — Encounter: Payer: Self-pay | Admitting: Medical

## 2012-10-15 DIAGNOSIS — R002 Palpitations: Secondary | ICD-10-CM

## 2012-10-15 HISTORY — DX: Palpitations: R00.2

## 2012-10-15 NOTE — Telephone Encounter (Signed)
Please, resend the PFT message for this patient. I can't find it. CLS

## 2012-10-15 NOTE — Telephone Encounter (Signed)
PFTs show mild obstructive defect with good response to bronchodilator.   Call and see what cardiology said today.   I really don't think her symptoms are related to the COPD, but lets await cardiology notes.

## 2012-10-16 NOTE — Telephone Encounter (Signed)
Patient is aware of her PFT results. CLS

## 2012-10-17 ENCOUNTER — Telehealth: Payer: Self-pay

## 2012-10-17 NOTE — Telephone Encounter (Signed)
Message copied by Lavell Islam on Thu Oct 17, 2012  1:35 PM ------      Message from: Jac Canavan      Created: Thu Oct 17, 2012  1:17 PM       I received short term disability forms.   As of current, we are awaiting records from [facility] regarding her visit this past week.  I don't have their latest note, and I had sent Karren Burly message to call patient and see what cardiology told her this week regarding her symptoms, the valve issue, and next steps.               I can't really complete the forms until I have more info on the diagnosis, plan, and proposed length of time she may be out of work. ------

## 2012-10-19 ENCOUNTER — Other Ambulatory Visit: Payer: Self-pay | Admitting: Family Medicine

## 2012-10-21 ENCOUNTER — Telehealth: Payer: Self-pay

## 2012-10-21 ENCOUNTER — Telehealth: Payer: Self-pay | Admitting: Family Medicine

## 2012-10-21 NOTE — Telephone Encounter (Signed)
Left message word for word per shane

## 2012-10-21 NOTE — Telephone Encounter (Signed)
I reviewed the cardiology notes.  It appears they had her start a sample of Symbicort as well.  See if any improvement on this so far.  I am aware they made the pulmonology appt.

## 2012-10-21 NOTE — Telephone Encounter (Signed)
I called over to [facility] to get a copy of the last OV notes ect. To be fax over to [place]. CLS I spoke with medical records and they will fax over all information. CLS

## 2012-10-21 NOTE — Telephone Encounter (Signed)
Message copied by Janeice Robinson on Mon Oct 21, 2012  2:22 PM ------      Message from: Aleen Campi, DAVID S      Created: Thu Oct 17, 2012  1:17 PM       I received short term disability forms.   As of current, we are awaiting records from [facility] regarding her visit this past week.  I don't have their latest note, and I had sent Karren Burly message to call patient and see what cardiology told her this week regarding her symptoms, the valve issue, and next steps.               I can't really complete the forms until I have more info on the diagnosis, plan, and proposed length of time she may be out of work. ------

## 2012-10-21 NOTE — Telephone Encounter (Signed)
[facility] CALLED AND LEFT ME A MESSAGE SAID THE CARDIOLOGIST HAS HER ON HEART MONITOR AND ALSO SCHEDULED HER TO SEE PULMONARY

## 2012-10-21 NOTE — Telephone Encounter (Signed)
Called and left message.

## 2012-10-22 ENCOUNTER — Encounter: Payer: Self-pay | Admitting: Medical

## 2012-10-22 NOTE — Telephone Encounter (Signed)
Complete the remaining portions of the form, make sure she is aware of pulmonology appt per cardiology.  I tentatively put a return to work date of 11/05/12, but this is obviously pending pulmonology consult and response to treatment.

## 2012-10-23 DIAGNOSIS — Z0289 Encounter for other administrative examinations: Secondary | ICD-10-CM

## 2012-10-23 NOTE — Telephone Encounter (Signed)
LMOM TO CB. X 2 CLS 

## 2012-10-25 ENCOUNTER — Encounter: Payer: Self-pay | Admitting: Internal Medicine

## 2012-10-25 ENCOUNTER — Ambulatory Visit (INDEPENDENT_AMBULATORY_CARE_PROVIDER_SITE_OTHER): Payer: Managed Care, Other (non HMO) | Admitting: Internal Medicine

## 2012-10-25 VITALS — BP 116/78 | HR 87 | Temp 97.9°F | Ht 66.0 in | Wt 138.0 lb

## 2012-10-25 DIAGNOSIS — R053 Chronic cough: Secondary | ICD-10-CM | POA: Insufficient documentation

## 2012-10-25 DIAGNOSIS — R059 Cough, unspecified: Secondary | ICD-10-CM

## 2012-10-25 DIAGNOSIS — R002 Palpitations: Secondary | ICD-10-CM | POA: Insufficient documentation

## 2012-10-25 DIAGNOSIS — J45909 Unspecified asthma, uncomplicated: Secondary | ICD-10-CM

## 2012-10-25 DIAGNOSIS — I10 Essential (primary) hypertension: Secondary | ICD-10-CM

## 2012-10-25 DIAGNOSIS — R05 Cough: Secondary | ICD-10-CM

## 2012-10-25 MED ORDER — NEBIVOLOL HCL 5 MG PO TABS: 5.0000 mg | ORAL_TABLET | Freq: Every day | ORAL | Status: DC

## 2012-10-25 MED ORDER — TRAMADOL HCL 50 MG PO TABS: ORAL_TABLET | ORAL | Status: DC

## 2012-10-25 MED ORDER — PREDNISONE (PAK) 10 MG PO TABS: ORAL_TABLET | ORAL | Status: DC

## 2012-10-25 NOTE — Progress Notes (Signed)
  Subjective:    Patient ID: Lisa Waters, female    DOB: [birth year]  MRN: [id number]  HPI  31 yowf quit smoking 2011 with min airflow obst by PFT's 09/2012 referred for [facility] for sob / copd on cxr   10/25/2012 1st pulmonary eval  On no chronic inhalers cc acute onset Feb 2014  chest tight with cough prod green mucus rx prn albuterol and abx then 3 weeks the morning after raking sob x walking up steps where previously could work out assoc with cough min productive rx with symbicort maybe helped a little then it didn't.  No obvious daytime variabilty or assoc chronic cough or cp or chest tightness, subjective wheeze overt sinus or hb symptoms. No unusual exp hx or h/o childhood pna/ asthma or premature birth to his knowledge.   Sleeping ok without nocturnal  or early am exacerbation  of respiratory  c/o's or need for noct saba. Also denies any obvious fluctuation of symptoms with weather or environmental changes or other aggravating or alleviating factors except as outlined above   Review of Systems  Constitutional: Negative for fever, chills and unexpected weight change.  HENT: Negative for ear pain, nosebleeds, congestion, sore throat, rhinorrhea, sneezing, trouble swallowing, dental problem, voice change, postnasal drip and sinus pressure.   Eyes: Negative for visual disturbance.  Respiratory: Positive for cough, chest tightness and shortness of breath. Negative for choking.   Cardiovascular: Negative for chest pain and leg swelling.  Gastrointestinal: Negative for vomiting, abdominal pain and diarrhea.  Genitourinary: Negative for difficulty urinating.  Musculoskeletal: Negative for arthralgias.  Skin: Negative for rash.  Neurological: Positive for headaches. Negative for tremors and syncope.  Hematological: Does not bruise/bleed easily.       Objective:   Physical Exam Very hoarse anxious wf  Wt Readings from Last 3 Encounters:  10/25/12 138 lb (62.596 kg)  10/11/12 138 lb  (62.596 kg)  10/07/12 140 lb (63.504 kg)     HEENT: nl dentition, turbinates, and orophanx. Nl external ear canals without cough reflex   NECK :  without JVD/Nodes/TM/ nl carotid upstrokes bilaterally   LUNGS: no acc muscle use, clear to A and P bilaterally without cough on insp or exp maneuvers   CV:  RRR  no s3 or murmur or increase in P2, no edema   ABD:  soft and nontender with nl excursion in the supine position. No bruits or organomegaly, bowel sounds nl  MS:  warm without deformities, calf tenderness, cyanosis or clubbing  SKIN: warm and dry without lesions    NEURO:  alert, approp, no deficits   cxr 10/07/12  COPD changes with right upper lobe scarring.  No acute abnormalities     Assessment & Plan:

## 2012-10-25 NOTE — Telephone Encounter (Signed)
Patient states that the doctor told her to stop all inhalers. She said they changed her heart medication because sometimes heart medication can cause you to have trouble breathing. She said he said that she did not have COPD. The doctor said the PFT was pretty good. CLS  She is aware of her forms are ready. CLS

## 2012-10-25 NOTE — Patient Instructions (Addendum)
Take delsym two tsp every 12 hours and supplement if needed with  tramadol 50 mg up to 2 every 4 hours to suppress the urge to cough. Swallowing water or using ice chips/non mint and menthol containing candies (such as lifesavers or sugarless jolly ranchers) are also effective.  You should rest your voice and avoid activities that you know make you cough.  Once you have eliminated the cough for 3 straight days try reducing the tramadol first,  then the delsym as tolerated.    Continue prilosec Take 30-60 min before first meal of the day and take Pepcid ac 20 mg at bedtime until return  GERD (REFLUX)  is an extremely common cause of respiratory symptoms, many times with no significant heartburn at all.    It can be treated with medication, but also with lifestyle changes including avoidance of late meals, excessive alcohol, smoking cessation, and avoid fatty foods, chocolate, peppermint, colas, red wine, and acidic juices such as orange juice.  NO MINT OR MENTHOL PRODUCTS SO NO COUGH DROPS  USE SUGARLESS CANDY INSTEAD (jolley ranchers or Stover's)  NO OIL BASED VITAMINS - use powdered substitutes.    Prednisone 10 mg take  4 each am x 2 days,   2 each am x 2 days,  1 each am x 2 days and stop   Stop pindolol and take bystolic 5 mg one daily (will not make you wheeze so stop all inhalers)  Late add:  Instruction should say to keep the albuterol on hand for as needed use which she should find is less than twice weekly and if not will need to return here asap  .

## 2012-10-27 DIAGNOSIS — J45909 Unspecified asthma, uncomplicated: Secondary | ICD-10-CM | POA: Insufficient documentation

## 2012-10-27 NOTE — Assessment & Plan Note (Signed)
-   See pft's 10/11/12 > 12% improvement p B2 = normalizes   So she has minimal asthma, not copd, and may not even need an inhaler once off pindolol  Reviewed the rule of 2's for asthma control with pt  If your breathing worsens or you need to use your rescue inhaler more than twice weekly or wake up more than twice a month with any respiratory symptoms or require more than two rescue inhalers per year, we need to see you right away because this means we're not controlling the underlying problem (inflammation) adequately.  Rescue inhalers do not control inflammation and overuse can lead to unnecessary and costly consequences.  They can make you feel better temporarily but eventually they will quit working effectively much as sleep aids lead to more insomnia if used regularly.

## 2012-10-27 NOTE — Assessment & Plan Note (Signed)
Strongly prefer in this setting: Bystolic, the most beta -1  selective Beta blocker available in sample form, with bisoprolol the most selective generic choice  on the market.  

## 2012-10-27 NOTE — Assessment & Plan Note (Addendum)
DDX of  difficult airways managment all start with A and  include Adherence, Ace Inhibitors, Acid Reflux, Active Sinus Disease, Alpha 1 Antitripsin deficiency, Anxiety masquerading as Airways dz,  ABPA,  allergy(esp in young), Aspiration (esp in elderly), Adverse effects of DPI,  Active smokers, plus two Bs  = Bronchiectasis and Beta blocker use..and one C= CHF   ? Acid reflux related >  Of the three most common causes of chronic cough, only one (GERD)  can actually cause the other two (asthma and post nasal drip syndrome)  and perpetuate the cylce of cough inducing airway trauma, inflammation, heightened sensitivity to reflux which is prompted by the cough itself via a cyclical mechanism.    This may partially respond to steroids and look like asthma and post nasal drainage but never erradicated completely unless the cough and the secondary reflux are eliminated, preferably both at the same time.  While not intuitively obvious, many patients with chronic low grade reflux do not cough until there is a secondary insult that disturbs the protective epithelial barrier and exposes sensitive nerve endings.  This can be viral or direct physical injury such as with an endotracheal tube.   The point is that once this occurs, it is difficult to eliminate using anything but a maximally effective acid suppression regimen at least in the short run, accompanied by an appropriate diet to address non acid GERD.   ? Beta blocker related > see hbp

## 2012-10-28 NOTE — Telephone Encounter (Signed)
Ok I'm confused.  Pls get copy of the pulmonology notes so I can review.  Her PFTs weren't 100% normal either.

## 2012-10-29 NOTE — Telephone Encounter (Signed)
Lisa Waters, her PFT results are scanned in and her OV notes from Dr. Sherene Sires are in the chart. His office is on [address]. CLS

## 2012-11-08 ENCOUNTER — Ambulatory Visit (INDEPENDENT_AMBULATORY_CARE_PROVIDER_SITE_OTHER): Payer: Managed Care, Other (non HMO) | Admitting: Internal Medicine

## 2012-11-08 ENCOUNTER — Encounter: Payer: Self-pay | Admitting: Internal Medicine

## 2012-11-08 VITALS — BP 104/70 | HR 75 | Temp 98.0°F | Ht 66.0 in | Wt 138.8 lb

## 2012-11-08 DIAGNOSIS — R05 Cough: Secondary | ICD-10-CM

## 2012-11-08 DIAGNOSIS — R059 Cough, unspecified: Secondary | ICD-10-CM

## 2012-11-08 DIAGNOSIS — J45909 Unspecified asthma, uncomplicated: Secondary | ICD-10-CM

## 2012-11-08 DIAGNOSIS — I10 Essential (primary) hypertension: Secondary | ICD-10-CM

## 2012-11-08 MED ORDER — FAMOTIDINE 20 MG PO TABS: ORAL_TABLET | ORAL | Status: DC

## 2012-11-08 NOTE — Progress Notes (Signed)
Subjective:    Patient ID: Lisa Waters, female    DOB: 12-15-1980  MRN: [id number]  HPI  31 yowf quit smoking 2011 with min airflow obst by PFT's 09/2012 referred for [facility] for sob / copd on cxr   10/25/2012 1st pulmonary eval  On no chronic inhalers cc acute onset Feb 2014  chest tight with cough prod green mucus rx prn albuterol and abx then 3 weeks the morning after raking sob x walking up steps where previously could work out assoc with cough min productive rx with symbicort maybe helped a little then it didn't. rec Take delsym two tsp every 12 hours and supplement if needed with  tramadol 50 mg up to 2 every 4 hours to suppress the urge to cough.   Once you have eliminated the cough for 3 straight days try reducing the tramadol first,  then the delsym as tolerated.   Continue prilosec Take 30-60 min before first meal of the day and take Pepcid ac 20 mg at bedtime until return GERD   diet. Prednisone 10 mg take  4 each am x 2 days,   2 each am x 2 days,  1 each am x 2 days and stop  Stop pindolol and take bystolic 5 mg one daily   11/08/2012 f/u ov/Lisa Waters re chronic cough Chief Complaint  Patient presents with  . Follow-up    Breathing and cough are much improved, cough is now non prod and only bothers her at night.    not limited from desired activities due to any sob  No obvious daytime variabilty or assoc chronic cough or cp or chest tightness, subjective wheeze overt sinus or hb symptoms. No unusual exp hx or h/o childhood pna/ asthma or premature birth to his knowledge.   Sleeping ok without nocturnal  or early am exacerbation  of respiratory  c/o's or need for noct saba. Also denies any obvious fluctuation of symptoms with weather or environmental changes or other aggravating or alleviating factors except as outlined above   Current Medications, Allergies, Past Medical History, Past Surgical History, Family History, and Social History were reviewed in [employer] record.  ROS  The following are not active complaints unless bolded sore throat, dysphagia, dental problems, itching, sneezing,  nasal congestion or excess/ purulent secretions, ear ache,   fever, chills, sweats, unintended wt loss, pleuritic or exertional cp, hemoptysis,  orthopnea pnd or leg swelling, presyncope, palpitations, heartburn, abdominal pain, anorexia, nausea, vomiting, diarrhea  or change in bowel or urinary habits, change in stools or urine, dysuria,hematuria,  rash, arthralgias, visual complaints, headache, numbness weakness or ataxia or problems with walking or coordination,  change in mood/affect or memory.      .       Objective:   Physical Exam    anxious wf nad   11/08/2012      138  Wt Readings from Last 3 Encounters:  10/25/12 138 lb (62.596 kg)  10/11/12 138 lb (62.596 kg)  10/07/12 140 lb (63.504 kg)     HEENT: nl dentition, turbinates, and orophanx. Nl external ear canals without cough reflex   NECK :  without JVD/Nodes/TM/ nl carotid upstrokes bilaterally   LUNGS: no acc muscle use, clear to A and P bilaterally without cough on insp or exp maneuvers   CV:  RRR  no s3 or murmur or increase in P2, no edema   ABD:  soft and nontender with nl excursion in the supine position. No bruits  or organomegaly, bowel sounds nl  MS:  warm without deformities, calf tenderness, cyanosis or clubbing     cxr 10/07/12  COPD changes with right upper lobe scarring.  No acute abnormalities     Assessment & Plan:

## 2012-11-08 NOTE — Patient Instructions (Addendum)
Add chlortrimeton 4 mg at bedtime and continue pepcid 20 mg one at bedtime to see if the sense of nasal drainage and nocturnal cough resolve  Please schedule a follow up office visit in 6 weeks, call sooner if needed

## 2012-11-11 NOTE — Assessment & Plan Note (Signed)
Adequate control on present rx, reviewed need to avoid non-specific BB here.

## 2012-11-11 NOTE — Assessment & Plan Note (Signed)
-   See pft's 10/11/12 > 12% improvement p B2 = normalizes   So this is not copd after all and should not need any kind of maint rx if can avoid non-specific BB though only time will tell in this regard.

## 2012-11-11 NOTE — Assessment & Plan Note (Addendum)
-   Try off pindolol 10/25/2012 > marked improvement 11/08/12 so continue bystolic with bisoprolol another very selective alternative available in generic.  Still has sense of pnds at hs so will add h1 and h2 at hs per guidelines

## 2012-11-14 ENCOUNTER — Other Ambulatory Visit: Payer: Self-pay

## 2012-11-14 MED ORDER — SERTRALINE HCL 100 MG PO TABS: 100.0000 mg | ORAL_TABLET | Freq: Every day | ORAL | Status: DC

## 2012-11-14 MED ORDER — LEVOTHYROXINE SODIUM 88 MCG PO TABS: 88.0000 ug | ORAL_TABLET | Freq: Every day | ORAL | Status: DC

## 2012-11-14 NOTE — Telephone Encounter (Signed)
SENT 90 DAYS

## 2012-11-15 ENCOUNTER — Telehealth: Payer: Self-pay | Admitting: Medical

## 2012-11-18 ENCOUNTER — Telehealth: Payer: Self-pay | Admitting: Medical

## 2012-11-18 ENCOUNTER — Other Ambulatory Visit: Payer: Self-pay | Admitting: Family Medicine

## 2012-11-18 MED ORDER — OMEPRAZOLE 40 MG PO CPDR: 40.0000 mg | DELAYED_RELEASE_CAPSULE | Freq: Every day | ORAL | Status: DC

## 2012-11-18 NOTE — Telephone Encounter (Signed)
Let have her f/u, OV

## 2012-11-18 NOTE — Telephone Encounter (Signed)
Medication refills was sent to the pharmacy. CLS

## 2012-11-19 ENCOUNTER — Other Ambulatory Visit: Payer: Self-pay | Admitting: Internal Medicine

## 2012-11-19 NOTE — Telephone Encounter (Signed)
I left a message for the patient on her voicemail that she will need to schedule a OV. CLS

## 2012-11-20 ENCOUNTER — Telehealth: Payer: Self-pay | Admitting: Internal Medicine

## 2012-11-20 MED ORDER — OMEPRAZOLE 40 MG PO CPDR: 40.0000 mg | DELAYED_RELEASE_CAPSULE | Freq: Every day | ORAL | Status: DC

## 2012-11-20 NOTE — Telephone Encounter (Signed)
Request for omeprazole to [facility]

## 2012-11-20 NOTE — Telephone Encounter (Signed)
Medication refill was sent to the pharmacy. CLS

## 2012-11-25 ENCOUNTER — Telehealth: Payer: Self-pay | Admitting: Family Medicine

## 2012-11-25 NOTE — Telephone Encounter (Signed)
I left a message on her voicemail about scheduling a OV. CLS

## 2012-11-25 NOTE — Telephone Encounter (Signed)
Message copied by Janeice Robinson on Mon Nov 25, 2012  3:45 PM ------      Message from: Jac Canavan      Created: Mon Nov 25, 2012  7:59 AM       If i didn't send this to you already, she needs OV for preop ------

## 2012-11-27 ENCOUNTER — Institutional Professional Consult (permissible substitution): Payer: Managed Care, Other (non HMO) | Admitting: Medical

## 2012-12-03 ENCOUNTER — Encounter: Payer: Self-pay | Admitting: Medical

## 2012-12-03 ENCOUNTER — Ambulatory Visit (INDEPENDENT_AMBULATORY_CARE_PROVIDER_SITE_OTHER): Payer: Managed Care, Other (non HMO) | Admitting: Medical

## 2012-12-03 VITALS — BP 92/60 | HR 72 | Temp 98.2°F | Resp 16 | Wt 144.0 lb

## 2012-12-03 DIAGNOSIS — F411 Generalized anxiety disorder: Secondary | ICD-10-CM

## 2012-12-03 DIAGNOSIS — G43909 Migraine, unspecified, not intractable, without status migrainosus: Secondary | ICD-10-CM

## 2012-12-03 DIAGNOSIS — R0609 Other forms of dyspnea: Secondary | ICD-10-CM

## 2012-12-03 DIAGNOSIS — R0600 Dyspnea, unspecified: Secondary | ICD-10-CM

## 2012-12-03 DIAGNOSIS — I059 Rheumatic mitral valve disease, unspecified: Secondary | ICD-10-CM

## 2012-12-03 DIAGNOSIS — I340 Nonrheumatic mitral (valve) insufficiency: Secondary | ICD-10-CM

## 2012-12-03 DIAGNOSIS — C8190 Hodgkin lymphoma, unspecified, unspecified site: Secondary | ICD-10-CM

## 2012-12-03 DIAGNOSIS — E039 Hypothyroidism, unspecified: Secondary | ICD-10-CM

## 2012-12-03 DIAGNOSIS — I341 Nonrheumatic mitral (valve) prolapse: Secondary | ICD-10-CM

## 2012-12-03 DIAGNOSIS — K219 Gastro-esophageal reflux disease without esophagitis: Secondary | ICD-10-CM

## 2012-12-03 DIAGNOSIS — F419 Anxiety disorder, unspecified: Secondary | ICD-10-CM

## 2012-12-03 DIAGNOSIS — R0989 Other specified symptoms and signs involving the circulatory and respiratory systems: Secondary | ICD-10-CM

## 2012-12-03 NOTE — Progress Notes (Signed)
Subjective: Here for surgery clearance.  Been having right shoulder issues, has had steroid injection, fluoroscopic guided injection and now Dr. Ranell Patrick is proceeding to surgery.   Has had PT as well.  Ortho had her on hydrocodone, but this made her wired feeling.  Dealing with the pain, wants the surgery ASAP.  Saw me, cardiology and pulmonology back in March/April for issues with dyspnea.  Has known MVP and mitral regurgitation, had Holter testing, but ended up being referred to Dr. Sherene Sires.  Dr. Sherene Sires took her off Pindolol, and advised non selective beta blockers. Was put on Famotidine for possible GERD component and Bystolic.  Was advised she may have touch of asthma, but nothing to act on at this time.  Anxiety - last visit I prescribed her some prn Xanax.  She is still taking Zoloft.  Since she has had some resolution on the dyspnea, her anxiety hasn't been as bad. Some days are bad, but overall improved.   Hx/o hodgkin's lymphoma in remission, sees hem-onc yearly in December.  No recent enlarged nodes, sweats, weight loss, fevers.   Hx/o migraines with ocular features.  Was seeing Dr. Brandon Melnick, but at their last visit, felt like her visual issues were migraine related.  She has not had migraines in a long time, and no additional visual disturbances.  No other weakness, numbness, tingling, no slurred speech, no new changes.  Hypothyroidism - compliant with medication, no c/o.  Allergies  Allergen Reactions  . Codeine Hives  . Iohexol Hives  . Pindolol     Avoid non selective beta blockers due to dyspnea    Current Outpatient Prescriptions on File Prior to Visit  Medication Sig Dispense Refill  . aspirin 81 MG tablet Take 81 mg by mouth daily.      . famotidine (PEPCID) 20 MG tablet One at bedtime  30 tablet  11  . levothyroxine (SYNTHROID) 88 MCG tablet Take 1 tablet (88 mcg total) by mouth daily.  90 tablet  1  . nebivolol (BYSTOLIC) 5 MG tablet Take 1 tablet (5 mg total) by mouth daily.  30  tablet  0  . omeprazole (PRILOSEC) 40 MG capsule Take 1 capsule (40 mg total) by mouth daily.  90 capsule  0  . sertraline (ZOLOFT) 100 MG tablet Take 1 tablet (100 mg total) by mouth daily.  90 tablet  0  . SUMAtriptan (IMITREX) 100 MG tablet Take 1 tablet (100 mg total) by mouth every 2 (two) hours as needed for migraine. Not to exceed more than 2 tablets per 24 hours  10 tablet  1   No current facility-administered medications on file prior to visit.    Past Medical History  Diagnosis Date  . History of hodgkin's lymphoma 2007    in remission, sees [facility], Dr. Greggory Stallion; prior chemotherapy and radiation therapy; sees yearly in December  . Hypothyroidism   . Depression   . Anxiety   . GERD (gastroesophageal reflux disease)   . MVP (mitral valve prolapse)     Dr. Herbie Baltimore, [facility]  . Migraine     associated ocular symptoms, Dr. Brandon Melnick, [facility]  . History of MRI of brain and brain stem 06/2011    no acute abnormality, small area of encephalomalacia left superior cerebellum that could be prior trauma, infection, or lacunar infarct  . H/O chest x-ray 09/2012    COPD changes with right upper lobe scarring  . History of PFTs 10/14/12    mild obstructive defect, normal volumes, normal diffusion, +  response to bronchodilator, FEV1 96% predicted, FEV1/FVC 69%  . H/O echocardiogram 10/10/12    LVEF 60-65%, wall motion normal, LV function normal, moderate MVP, moderate regurgitation, no shunt; Dr.   . Palpitations 4/14    holter monitor, Dr. Herbie Baltimore  . Mitral regurgitation   . Dyspnea 4/14    pulmonology consult 4/14, thought to be related to non-selective beta blockers.  Pindolol was changed to Bystolic  . Routine gynecological examination 10/13    pap normal  . History of mammogram 12/2012    normal    Past Surgical History  Procedure Laterality Date  . Appendectomy    . Breast enhancement surgery    . Rhinoplasty  deviated septum  . Lymphnode biopsy      Family History  Problem  Relation Age of Onset  . Skin cancer Father   . Emphysema Paternal Grandfather     was a smoker  . Diabetes Maternal Aunt   . Stroke Maternal Grandmother   . Cancer Paternal Grandmother     metastasis  . Heart disease Neg Hx   . Hypertension Neg Hx   . Hyperlipidemia Neg Hx     History   Social History  . Marital Status: Married    Spouse Name: N/A    Number of Children: 0  . Years of Education: N/A   Occupational History  . works in [employer]   Social History Main Topics  . Smoking status: Former Smoker -- 1.00 packs/day for 10 years    Types: Cigarettes    Quit date: 11/07/2009  . Smokeless tobacco: Never Used  . Alcohol Use: No     Comment: occasional  . Drug Use: No  . Sexually Active: Yes -- Female partner(s)    Birth Control/ Protection: None   Other Topics Concern  . 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   Objective:   Physical Exam  Filed Vitals:   12/03/12 1537  BP: 92/60  Pulse: 72  Temp: 98.2 F (36.8 C)  Resp: 16    General appearance: alert, no distress, WD/WN, lean white female Skin: tattoos low back, right dorsal foot HEENT: normocephalic, sclerae anicteric, PERRLA, EOMi, nares patent, no discharge or erythema, pharynx normal Oral cavity: MMM, no lesions, teeth in good repair Neck: supple, no lymphadenopathy, no thyromegaly, no masses Heart: RRR, faint 2/6 brief holosystolic murmur heard only on left lower sternal border and lateral chest/axilla Lungs: CTA bilaterally, no wheezes, rhonchi, or rales Abdomen: +bs, umbilical piercing, soft, non tender, non distended, no masses, no hepatomegaly, no splenomegaly Back: non tender Musculoskeletal: no swelling, no obvious deformity Extremities: no edema, no cyanosis, no  clubbing Pulses: 2+ symmetric, upper and lower extremities, normal cap refill Neurological: alert, oriented x 3, CN2-12 intact, strength normal upper extremities and lower extremities, sensation normal throughout, DTRs 2-3+  throughout, no cerebellar signs, gait normal Psychiatric: normal affect, behavior normal, pleasant    Assessment and Plan :    Encounter Diagnoses  Name Primary?  . MVP (mitral valve prolapse) Yes  . Mitral regurgitation   . Dyspnea   . Anxiety   . Migraine headache   . GERD (gastroesophageal reflux disease)   . Hypothyroidism   . Hodgkin's disease in remission    I reviewed her recent consultant notes,  labs, xrays, updated chart record, will request last pap result from 10/13.   MVP, mitral regurgitation - updated chart record, will defer back to cardiology for clearance for surgery  Dyspnea - reviewed cardiology and pulmonology records.  After stopping Pindolol and changing to Bystolic, and adding famotidine, the dyspnea has resolved.   Dr. Sherene Sires advised she avoid non-selective beta blockers.    Anxiety - much improved.  Compliant with Zoloft, no recent use of Xanax, no c/o currently  Migraines - currently under good control, no recent concerns.  Has prn medication on hand for acute migraine  GERD - controlled on current medications  Hypothyroidism - labs 3/14 normal, c/t current medication  Hodgkin's in remission - no new concerns, reviewed 3/14 labs, recent CXR, will c/t yearly followup   Follow-up with orthopedics for surgery

## 2012-12-19 ENCOUNTER — Encounter: Payer: Self-pay | Admitting: Medical

## 2012-12-20 ENCOUNTER — Ambulatory Visit: Payer: Managed Care, Other (non HMO) | Admitting: Internal Medicine

## 2012-12-24 ENCOUNTER — Ambulatory Visit (INDEPENDENT_AMBULATORY_CARE_PROVIDER_SITE_OTHER): Payer: Managed Care, Other (non HMO) | Admitting: Cardiology

## 2012-12-24 ENCOUNTER — Encounter: Payer: Self-pay | Admitting: Cardiology

## 2012-12-24 VITALS — BP 110/60 | HR 68 | Ht 66.0 in | Wt 142.5 lb

## 2012-12-24 DIAGNOSIS — R002 Palpitations: Secondary | ICD-10-CM

## 2012-12-24 DIAGNOSIS — R059 Cough, unspecified: Secondary | ICD-10-CM

## 2012-12-24 DIAGNOSIS — R05 Cough: Secondary | ICD-10-CM

## 2012-12-24 DIAGNOSIS — J45909 Unspecified asthma, uncomplicated: Secondary | ICD-10-CM

## 2012-12-24 DIAGNOSIS — I059 Rheumatic mitral valve disease, unspecified: Secondary | ICD-10-CM

## 2012-12-24 DIAGNOSIS — I340 Nonrheumatic mitral (valve) insufficiency: Secondary | ICD-10-CM

## 2012-12-24 DIAGNOSIS — I341 Nonrheumatic mitral (valve) prolapse: Secondary | ICD-10-CM

## 2012-12-24 MED ORDER — NEBIVOLOL HCL 5 MG PO TABS: 5.0000 mg | ORAL_TABLET | Freq: Every day | ORAL | Status: DC

## 2012-12-24 NOTE — Patient Instructions (Addendum)
Your physician recommends that you schedule a follow-up appointment in MARCH 2015  Your physician has recommended you make the following change in your medicatio INHALERS AS NEEDED BASIS  Your physician has requested that you have an echocardiogram. Echocardiography is a painless test that uses sound waves to create images of your heart. It provides your doctor with information about the size and shape of your heart and how well your heart's chambers and valves are working. This procedure takes approximately one hour. There are no restrictions for this procedure.  It will be schedule for Feb 2015

## 2012-12-28 ENCOUNTER — Encounter: Payer: Self-pay | Admitting: Cardiology

## 2012-12-28 DIAGNOSIS — I340 Nonrheumatic mitral (valve) insufficiency: Secondary | ICD-10-CM | POA: Insufficient documentation

## 2012-12-28 NOTE — Assessment & Plan Note (Addendum)
Relatively stable both on exam and by echocardiogram. We'll simply as follows up now and annual basis. We'll have one checked in over March timeframe next year and ostial back after that. Certainly she can come back sooner. Untoward symptoms arise.

## 2012-12-28 NOTE — Assessment & Plan Note (Addendum)
Switch from pindolol to Bystolic. Blood pressure doing well with that. Palpitations are better controlled. She was previously listed as having hypertension which he does not have.

## 2012-12-28 NOTE — Assessment & Plan Note (Signed)
She clearly has chest x-ray evidence and PFT evidence of some reactive airways disease. She is on a selective beta blocker now, that she is no longer planning to get pregnant. I still wonder to use the inhalers when necessary. We also discussed using antihistamine-type medications for allergic relief during the peak allergy seasons. Her biggest episodes of this dyspnea had been in the early fall in late spring during the peak allergy times. I fully agree with her primary sending her to a allergy and immunology specialist. I think if she were to treat seasonal allergies and uses a when necessary bronchodilator plus or minus December or during these times she probably will do fine.

## 2012-12-28 NOTE — Progress Notes (Signed)
Patient ID: Lisa Waters, female   DOB: 03-30-1981, 32 y.o.   MRN: [id number]  Clinic Note: HPI: Lisa Waters is a 32 y.o. female with a PMH notable for history of Hodgkin's lymphoma status post chemotherapy radiation in 2000 at, as well as a roughly one-year history of diagnosed moderate mitral regurgitation, not yet surgical.  I first met her last sprain shortly after a all weekend after a concert where she is exposed to lots of dust and sand and smoke where she had acute onset of dyspnea wheezing and had edema. As part of that evaluation we found that she does have moderate mitral regurgitation that was evaluated with transesophageal echocardiogram. She did not warrant surgical treatment that time and really has been doing okay from a cardiac standpoint since. She had some palpitations in the past, do to hurt intending to try to get pregnant, we were using the only appropriate beta blocker that can be used which was pindolol. This is in switch to Bystolic by our pulmonology colleagues, with the diagnosis that beta blockers have caused her to have wheezing. Despite that if the wheezing occurred prior to her being on beta blockers in the past.   When she last saw Corine Shelter, [place] here at our office, she had a BNP checked it was not very elevated despite her dyspnea, her echocardiogram really showed no significant change and she had no relief from her Lasix. At that time we proximal cord inhaler as well as an albuterol inhaler, and she's said she felt better.  She had chest x-rays showed significant evidence of COPD and had notable improvement with her bronchodilator inhalers, yet she was told by the pulmonologist that she is fine and shouldn't take those medicines. I don't seen understand how that is possible, as her symptoms improved with use of modalities and not with diuresis.   Interval History: She presents today for followup from her recent visit where she had significant dyspnea and wheezing. She  intermittently has these episodes of discomfort in her chest with palpitations and mild dyspnea but really nothing persistent.  She has been using the inhaler on a more when necessary basis now. She is basically feels tired all the time. She's been troubled by some diffuse shoulder pain medicines due to have shoulder surgery for evaluation for shoulder sugar he soon. I think because doesn't sleep very well at night because she wakes up with her shoulder hurting and then can sleep again. It is not orthopnea or PND related. She denies any chest pain with exertion. She denies any recent episodes of edema since really since the the initial episode she had when she first came in. She denies any significant palpitations but no lightheadedness, dizziness or wooziness. No syncope or near syncope. No TIA or amaurosis symptoms recently. She did have an episode last fall, but has not any further. She is now currently on aspirin for that.   No melena, hematochezia hematuria.   Past Medical History  Diagnosis Date  . History of hodgkin's lymphoma 2007    in remission, sees [facility], Dr. Greggory Stallion; prior chemotherapy and radiation therapy; sees yearly in December  . Hypothyroidism   . Depression   . Anxiety   . GERD (gastroesophageal reflux disease)   . MVP (mitral valve prolapse)     Dr. Herbie Baltimore, [facility]; Moderate  MR; EF 60-65%  . Migraine     associated ocular symptoms, Dr. Brandon Melnick, [facility]  . Abnormal finding on chest xray 09/2012  COPD changes with right upper lobe scarring  . Palpitations 4/14    holter monitor, Dr. Herbie Baltimore; no arrhythmia   . Moderate mitral regurgitation by prior echocardiogram 09/2012  . History of mammogram 12/2012    normal  . H/O amaurosis fugax Fall 2012    MRI of brain/brain stem: no acute abnormality, small area of encephalomalacia left superior cerebellum that could be prior trauma, infection, or lacunar infarct  . Reactive airway disease with wheezing     PFTs March 2014:  mild obstructive defect, normal volumes, normal diffusion, +response to bronchodilator, FEV1 96% predicted, FEV1/FVC 69%; Pulmonology consult 4/14, thought to be related to non-selective beta blockers - she had these symptoms before beta blockers.  Pindolol was changed to Bystolic    Prior Cardiac Evaluation and Past Surgical History: Past Surgical History  Procedure Laterality Date  . Appendectomy    . Breast enhancement surgery    . Rhinoplasty  deviated septum  . Lymphnode biopsy    . Doppler echocardiography  09/30/12    LVEF 60-65%, wall motion normal, LV function normal, moderate MVP, moderate regurgitation, no shunt; Dr.     Forrestine Him  Allergen Reactions  . Codeine Hives  . Iohexol Hives  . Pindolol     Avoid non selective beta blockers due to dyspnea    Current Outpatient Prescriptions  Medication Sig Dispense Refill  . aspirin 81 MG tablet Take 81 mg by mouth daily.      . famotidine (PEPCID) 20 MG tablet One at bedtime  30 tablet  11  . levothyroxine (SYNTHROID) 88 MCG tablet Take 1 tablet (88 mcg total) by mouth daily.  90 tablet  1  . nebivolol (BYSTOLIC) 5 MG tablet Take 1 tablet (5 mg total) by mouth daily.  90 tablet  3  . omeprazole (PRILOSEC) 40 MG capsule Take 1 capsule (40 mg total) by mouth daily.  90 capsule  0  . sertraline (ZOLOFT) 100 MG tablet Take 1 tablet (100 mg total) by mouth daily.  90 tablet  0  . SUMAtriptan (IMITREX) 100 MG tablet Take 1 tablet (100 mg total) by mouth every 2 (two) hours as needed for migraine. Not to exceed more than 2 tablets per 24 hours  10 tablet  1   No current facility-administered medications for this visit.    History   Social History  . Marital Status: Married    Spouse Name: N/A    Number of Children: 0  . Years of Education: N/A   Occupational History  . works in [employer]   Social History Main Topics  . Smoking status: Former Smoker -- 1.00 packs/day for 10 years    Types: Cigarettes    Quit  date: 11/07/2009  . Smokeless tobacco: Never Used  . Alcohol Use: No     Comment: occasional  . Drug Use: No  . Sexually Active: Yes -- Female partner(s)    Birth Control/ Protection: None   Other Topics Concern  . 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 Notably improved overall symptoms. Less dyspneic. Musculoskeletal ROS: positive for - joint pain, joint stiffness and pain in shoulder - right  PHYSICAL EXAM BP 110/60  Pulse 68  Ht 5\' 6"  (1.676 m)  Wt 142 lb 8 oz (64.638 kg)  BMI 23.01 kg/m2 General appearance: alert, cooperative, appears stated age, no distress and Thad Ranger pleasant, normal mood and affect. Well-nourished, well-groomed. HEENT: [place]/at, EOMI, MMM, anicteric sclera Neck: no adenopathy, no carotid bruit, no JVD and supple, symmetrical, trachea midline Lungs: clear to auscultation bilaterally, normal percussion bilaterally and Nonlabored, no active wheezing, good air movement Heart: normal apical impulse, regular rate and rhythm, S1, S2 normal, no S3 or S4, systolic murmur: holosystolic 3/6, blowing at lower left sternal border, at apex, radiates to axilla, no click and no rub Abdomen: soft, non-tender; bowel sounds normal; no masses,  no organomegaly Extremities: extremities normal, atraumatic, no cyanosis or edema Pulses: 2+ and symmetric Neurologic: Grossly normal   [id number] today: No  Recent Labs: None recently  ASSESSMENT: Symptomatically improved. This is improved. No active heart failure symptoms. No further TIA/amaurosis fugax symptoms. Palpitations improved  Problem List  Diagnosis  Mitral regurgitation - Plan: 2D Echocardiogram without contrast  MVP (mitral valve prolapse)  Heart palpitations  Unspecified  asthma(493.90)  Moderate mitral regurgitation by prior echocardiogram  Cough  PLAN: Per problem list. Orders Placed This Encounter  Procedures  . 2D Echocardiogram without contrast    Standing Status: Future     Number of Occurrences:      Standing Expiration Date: 09/15/2013    Order Specific Question:  Type of Echo    Answer:  Complete    Order Specific Question:  Where should this test be performed    Answer:  [facility]    Order Specific Question:  Reason for exam-Echo    Answer:  Mitral Valve Disorder  424.0   Medication changes noted:  Medications  . nebivolol (BYSTOLIC) 5 MG tablet - changed from Pindolol    Sig: Take 1 tablet (5 mg total) by mouth daily.    Dispense:  90 tablet    Refill:  3    Followup: March 2015  Marykay Lex, M.D., M.S. [facility] [address] [place], [place]  [address]  [phone] Pager # [phone] 12/28/2012 3:34 PM

## 2012-12-28 NOTE — Assessment & Plan Note (Signed)
Probably related to reactive airways disease, may very well be allergy mediated.

## 2012-12-31 ENCOUNTER — Telehealth: Payer: Self-pay | Admitting: Cardiology

## 2012-12-31 MED ORDER — NEBIVOLOL HCL 5 MG PO TABS: 5.0000 mg | ORAL_TABLET | Freq: Every day | ORAL | Status: DC

## 2012-12-31 NOTE — Telephone Encounter (Signed)
Dr Herbie Baltimore office faxed Bystolic prescription to [facility] -it should have gone to [facility]-Fax#[phone]

## 2012-12-31 NOTE — Telephone Encounter (Signed)
Rx cancelled at [facility] and new Rx sent to [facility].

## 2013-01-14 ENCOUNTER — Telehealth: Payer: Self-pay | Admitting: Cardiology

## 2013-01-14 NOTE — Telephone Encounter (Signed)
Left 2 messages to call back to schedule her Echo. No return call. 1st time was on 6/27

## 2013-01-15 ENCOUNTER — Encounter: Payer: Self-pay | Admitting: Medical

## 2013-01-15 ENCOUNTER — Ambulatory Visit (INDEPENDENT_AMBULATORY_CARE_PROVIDER_SITE_OTHER): Payer: Managed Care, Other (non HMO) | Admitting: Medical

## 2013-01-15 VITALS — BP 100/68 | HR 62 | Temp 97.8°F | Resp 16 | Ht 66.0 in | Wt 139.0 lb

## 2013-01-15 DIAGNOSIS — Z0000 Encounter for general adult medical examination without abnormal findings: Secondary | ICD-10-CM

## 2013-01-15 LAB — POCT URINALYSIS DIPSTICK
Bilirubin, UA: NEGATIVE
Ketones, UA: NEGATIVE
Leukocytes, UA: NEGATIVE
Protein, UA: NEGATIVE

## 2013-01-15 NOTE — Telephone Encounter (Signed)
Try her cell phone or work number it is located in [facility] paper chart.

## 2013-01-15 NOTE — Progress Notes (Signed)
Subjective:   HPI  Lisa Waters is a 32 y.o. female who presents for a complete physical.  Here with wife who is also here for physical and both need form completed for adoption services.  They are planning to adopt.  They have already exhausted other options - in vitro, husband had varicocele for low sperm count, and her eggs are thought to be non useful given history of therapy for hodgkin's lymphoma.     Preventative care: Last ophthalmology visit:YES- [facility] Last dental visit:YES Dr. Val Eagle Last colonoscopy:N/A Last mammogram:N/A Last gynecological exam: Last EKG:2014 Last labs:09/2012  Prior vaccinations: TD or Tdap:6 YEARS AGO/ 2008 Influenza:2013 Pneumococcal:N/A Shingles/Zostavax:N/A Other: CARDIOLOGY  Advanced directive:N/A Health care power of attorney:N/A Living will:N/A  Concerns: Just had surgery mid June for right shoulder, frozen shoulder, ended up begin changed to open to repair.    Reviewed their medical, surgical, family, social, medication, and allergy history and updated chart as appropriate.   Past Medical History  Diagnosis Date  . History of hodgkin's lymphoma 2007    in remission, sees [facility], Dr. Greggory Stallion; prior chemotherapy and radiation therapy; sees yearly in December  . Hypothyroidism   . Depression   . Anxiety   . GERD (gastroesophageal reflux disease)   . MVP (mitral valve prolapse)     Dr. Herbie Baltimore, [facility]; Moderate  MR; EF 60-65%  . Migraine     associated ocular symptoms, Dr. Brandon Melnick, [facility]  . Abnormal finding on chest xray 09/2012    COPD changes with right upper lobe scarring  . Palpitations 4/14    holter monitor, Dr. Herbie Baltimore; no arrhythmia   . Moderate mitral regurgitation by prior echocardiogram 09/2012  . History of mammogram 12/2012    normal  . H/O amaurosis fugax Fall 2012    MRI of brain/brain stem: no acute abnormality, small area of encephalomalacia left superior cerebellum that could be prior trauma,  infection, or lacunar infarct  . Reactive airway disease with wheezing     PFTs March 2014: mild obstructive defect, normal volumes, normal diffusion, +response to bronchodilator, FEV1 96% predicted, FEV1/FVC 69%; Pulmonology consult 4/14, thought to be related to non-selective beta blockers - she had these symptoms before beta blockers.  Pindolol was changed to Bystolic    Past Surgical History  Procedure Laterality Date  . Appendectomy    . Breast enhancement surgery    . Rhinoplasty  deviated septum  . Lymphnode biopsy    . Doppler echocardiography  09/30/12    LVEF 60-65%, wall motion normal, LV function normal, moderate MVP, moderate regurgitation, no shunt; Dr.     Meryle Ready History  Problem Relation Age of Onset  . Skin cancer Father   . Emphysema Paternal Grandfather     was a smoker  . Diabetes Maternal Aunt   . Stroke Maternal Grandmother   . Cancer Paternal Grandmother     metastasis  . Heart disease Neg Hx   . Hypertension Neg Hx   . Hyperlipidemia Neg Hx     History   Social History  . Marital Status: Married    Spouse Name: N/A    Number of Children: 0  . Years of Education: N/A   Occupational History  . works in [employer]   Social History Main Topics  . Smoking status: Former Smoker -- 1.00 packs/day for 10 years    Types: Cigarettes    Quit date: 11/07/2009  . Smokeless tobacco: Never Used  . Alcohol Use:  No     Comment: occasional  . Drug Use: No  . Sexually Active: Yes -- Female partner(s)    Birth Control/ Protection: None   Other Topics Concern  . 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 aspirin 81 MG tablet Take 81 mg by mouth daily.      . famotidine (PEPCID) 20 MG tablet One at bedtime  30 tablet  11   . levothyroxine (SYNTHROID) 88 MCG tablet Take 1 tablet (88 mcg total) by mouth daily.  90 tablet  1  . nebivolol (BYSTOLIC) 5 MG tablet Take 1 tablet (5 mg total) by mouth daily.  90 tablet  3  . omeprazole (PRILOSEC) 40 MG capsule Take 1 capsule (40 mg total) by mouth daily.  90 capsule  0  . sertraline (ZOLOFT) 100 MG tablet Take 1 tablet (100 mg total) by mouth daily.  90 tablet  0  . SUMAtriptan (IMITREX) 100 MG tablet Take 1 tablet (100 mg total) by mouth every 2 (two) hours as needed for migraine. Not to exceed more than 2 tablets per 24 hours  10 tablet  1   No current facility-administered medications on file prior to visit.    Allergies  Allergen Reactions  . Codeine Hives  . Iohexol Hives  . Pindolol     Avoid non selective beta blockers due to dyspnea     Review of Systems Constitutional: -fever, -chills, -sweats, -unexpected weight change, -decreased appetite, -fatigue Allergy: -sneezing, -itching, -congestion Dermatology: -changing moles, --rash, -lumps ENT: -runny nose, -ear pain, -sore throat, -hoarseness, -sinus pain, -teeth pain, - ringing in ears, -hearing loss, -nosebleeds Cardiology: -chest pain, -palpitations, -swelling, -difficulty breathing when lying flat, -waking up short of breath Respiratory: -cough, -shortness of breath, -difficulty breathing with exercise or exertion, -wheezing, -coughing up blood Gastroenterology: -abdominal pain, -nausea, -vomiting, -diarrhea, -constipation, -blood in stool, -changes in bowel movement, -difficulty swallowing or eating Hematology: -bleeding, -bruising  Musculoskeletal: -joint aches, -muscle aches, -joint swelling, -back pain, -neck pain, -cramping, -changes in gait Ophthalmology: denies vision changes, eye redness, itching, discharge Urology: -burning with urination, -difficulty urinating, -blood in urine, -urinary frequency, -urgency, -incontinence Neurology: -headache, -weakness, -tingling, -numbness, -memory loss,  -falls, -dizziness Psychology: -depressed mood, -agitation, -sleep problems     Objective:   Physical Exam  Filed Vitals:   01/15/13 0856  BP: 100/68  Pulse: 62  Temp: 97.8 F (36.6 C)  Resp: 16    General appearance: alert, no distress, WD/WN, lean white female  Skin: tattoos low back, right dorsal foot  HEENT: normocephalic, sclerae anicteric, PERRLA, EOMi, nares patent, no discharge or erythema, pharynx normal  Oral cavity: MMM, no lesions, teeth in good repair  Neck: supple, no lymphadenopathy, no thyromegaly, no masses  Heart: RRR, faint 2/6 brief holosystolic murmur heard only on left lower sternal border and lateral chest/axilla  Lungs: CTA bilaterally, no wheezes, rhonchi, or rales  Abdomen: +bs, umbilical piercing, soft, non tender, non distended, no masses, no hepatomegaly, no splenomegaly  Back: non tender  Musculoskeletal: right shoulder with anterior linear surgical scar, separate anterior and posterior port scars, no other obvious deformity, rest of UE and LE extremity unremarkable Extremities: no edema, no cyanosis, no clubbing  Pulses: 2+ symmetric, upper and  lower extremities, normal cap refill  Neurological: alert, oriented x 3, CN2-12 intact, strength normal upper extremities and lower extremities, sensation normal throughout, DTRs 2-3+ throughout, no cerebellar signs, gait normal  Psychiatric: normal affect, behavior normal, pleasant  Breast/gyn - deferred to gynecology   Assessment and Plan :    Encounter Diagnosis  Name Primary?  . Routine general medical examination at a health care facility Yes     Physical exam - discussed healthy lifestyle, diet, exercise, preventative care, vaccinations, and addressed their concerns.  I completed her forms for adoption services and printed a letter on her behalf.  I reviewed her recent consultant notes, labs, xrays, updated chart record, will request last pap result from 10/13.  MVP, mitral regurgitation -  reviewed recent cardiology note, and she has yearly f/u. Dyspnea -still doing fine after stopping Pindolol and changing to Bystolic, and adding famotidine. Dr. Sherene Sires advised she avoid non-selective beta blockers.  Anxiety - much improved. Compliant with Zoloft, no recent use of Xanax, no c/o currently  Migraines - currently under good control, no recent concerns. Has prn medication on hand for acute migraine  GERD - controlled on current medications  Hypothyroidism - labs 3/14 normal, c/t current medication  Hodgkin's in remission - no new concerns, reviewed 3/14 labs, recent CXR, will c/t yearly follow up   Follow-up prn

## 2013-01-20 ENCOUNTER — Other Ambulatory Visit (HOSPITAL_COMMUNITY): Payer: Self-pay | Admitting: *Deleted

## 2013-01-20 ENCOUNTER — Telehealth (HOSPITAL_COMMUNITY): Payer: Self-pay | Admitting: Cardiology

## 2013-01-20 DIAGNOSIS — J309 Allergic rhinitis, unspecified: Secondary | ICD-10-CM | POA: Insufficient documentation

## 2013-01-20 DIAGNOSIS — B999 Unspecified infectious disease: Secondary | ICD-10-CM | POA: Insufficient documentation

## 2013-01-20 NOTE — Telephone Encounter (Signed)
LEFT MESSAGE TO CALL BACK REGARDING SCHEDULING HER ECHO

## 2013-02-01 ENCOUNTER — Other Ambulatory Visit: Payer: Self-pay | Admitting: Family Medicine

## 2013-02-20 ENCOUNTER — Telehealth: Payer: Self-pay | Admitting: Medical

## 2013-02-20 ENCOUNTER — Telehealth: Payer: Self-pay | Admitting: Family Medicine

## 2013-02-20 MED ORDER — SUMATRIPTAN SUCCINATE 100 MG PO TABS: 100.0000 mg | ORAL_TABLET | ORAL | Status: DC | PRN

## 2013-02-20 NOTE — Telephone Encounter (Signed)
Imitrex needs to be sent to   [facility] [address] [place]  NOT mail order

## 2013-02-20 NOTE — Telephone Encounter (Signed)
Imitrex renewed

## 2013-02-27 ENCOUNTER — Telehealth: Payer: Self-pay | Admitting: Medical

## 2013-02-27 NOTE — Telephone Encounter (Signed)
PT CAME IN FOR A CPE AND ADOPTION PAPERS WERE COMPLETED. THEY ARE NOW CHANGING ADOPTION AGENCIES AND NEEDS ANOTHER FORM FILLED OUT. SENDING BACK TO CHANDRA TO COMPLETE AND THEN TO YOU TO SIGN. PLEASE CALL PT WHEN COMPLETE.   °

## 2013-02-28 NOTE — Telephone Encounter (Signed)
Lisa Waters I filled out what I could on the forms but there is no HIV or PPD on file. CLS

## 2013-03-03 ENCOUNTER — Telehealth: Payer: Self-pay | Admitting: Family Medicine

## 2013-03-03 NOTE — Telephone Encounter (Signed)
Message copied by Janeice Robinson on Mon Mar 03, 2013 10:14 AM ------      Message from: Jac Canavan      Created: Fri Feb 28, 2013  5:39 PM       Edit/retype my letter, but address to this adoption agency.            She will need to return for HIV, RPR, and PPD skin test. ------

## 2013-03-03 NOTE — Telephone Encounter (Signed)
Pt coming in tomorrow for testing

## 2013-03-03 NOTE — Telephone Encounter (Signed)
Lmom to cb. CLS 

## 2013-03-04 ENCOUNTER — Other Ambulatory Visit (INDEPENDENT_AMBULATORY_CARE_PROVIDER_SITE_OTHER): Payer: Managed Care, Other (non HMO)

## 2013-03-04 DIAGNOSIS — Z139 Encounter for screening, unspecified: Secondary | ICD-10-CM

## 2013-03-04 DIAGNOSIS — Z111 Encounter for screening for respiratory tuberculosis: Secondary | ICD-10-CM

## 2013-03-05 NOTE — Telephone Encounter (Signed)
Patient is aware and she came by to get her lab work done. CLS

## 2013-03-06 NOTE — Progress Notes (Signed)
Quick Note:  PRINTED OUT LETTER OF RESULTS ______ 

## 2013-03-10 ENCOUNTER — Encounter: Payer: Self-pay | Admitting: Family Medicine

## 2013-03-12 NOTE — Telephone Encounter (Signed)
03/12/2013 °

## 2013-03-29 ENCOUNTER — Other Ambulatory Visit: Payer: Self-pay | Admitting: Family Medicine

## 2013-04-07 ENCOUNTER — Ambulatory Visit (INDEPENDENT_AMBULATORY_CARE_PROVIDER_SITE_OTHER): Payer: Managed Care, Other (non HMO) | Admitting: Family Medicine

## 2013-04-07 ENCOUNTER — Telehealth: Payer: Self-pay | Admitting: Internal Medicine

## 2013-04-07 ENCOUNTER — Encounter: Payer: Self-pay | Admitting: Family Medicine

## 2013-04-07 VITALS — BP 120/70 | HR 80 | Wt 147.0 lb

## 2013-04-07 DIAGNOSIS — C8190 Hodgkin lymphoma, unspecified, unspecified site: Secondary | ICD-10-CM

## 2013-04-07 DIAGNOSIS — Z23 Encounter for immunization: Secondary | ICD-10-CM

## 2013-04-07 DIAGNOSIS — R1310 Dysphagia, unspecified: Secondary | ICD-10-CM

## 2013-04-07 MED ORDER — HYOSCYAMINE SULFATE ER 0.375 MG PO TB12: 0.3750 mg | ORAL_TABLET | Freq: Two times a day (BID) | ORAL | Status: DC | PRN

## 2013-04-07 NOTE — Telephone Encounter (Signed)
Pt wants med to be sent to [facility] [address]. [facility]

## 2013-04-07 NOTE — Progress Notes (Signed)
  Subjective:    Patient ID: Lisa Waters, female    DOB: 07-08-1981, 32 y.o.   MRN: [id number]  HPI She has a one-week history of pain with swallowing. She states it doesn't matter what she swallows that she can feel the pain moving down her chest. Presently she is on an H2 blocker and PPI. She did try regular doses of a liquid antacid over the weekend with little success. She does have a history of Hodgkin's disease with radiation to the central chest area.  Review of Systems     Objective:   Physical Exam alert and in no distress. Tympanic membranes and canals are normal. Throat is clear. Tonsils are normal. Neck is supple without adenopathy or thyromegaly. Cardiac exam shows a regular sinus rhythm without murmurs or gallops. Lungs are clear to auscultation.        Assessment & Plan:   Need for prophylactic vaccination and inoculation against influenza - Plan: Flu Vaccine QUAD 36+ mos IM  Dysphagia - Plan: DISCONTINUED: hyoscyamine (LEVBID) 0.375 MG 12 hr tablet  Hodgkin's disease in remission  I will try her on Levbid to see if this will help. No proven, referral to gastroenterology will be made. Flu shot given with risks and benefits discussed.  flu shot given with risks and benefits discussed.

## 2013-04-09 ENCOUNTER — Telehealth: Payer: Self-pay | Admitting: *Deleted

## 2013-04-10 ENCOUNTER — Ambulatory Visit (INDEPENDENT_AMBULATORY_CARE_PROVIDER_SITE_OTHER): Payer: Managed Care, Other (non HMO) | Admitting: Family Medicine

## 2013-04-10 ENCOUNTER — Telehealth: Payer: Self-pay | Admitting: Family Medicine

## 2013-04-10 VITALS — BP 120/80

## 2013-04-10 DIAGNOSIS — Z8719 Personal history of other diseases of the digestive system: Secondary | ICD-10-CM

## 2013-04-10 DIAGNOSIS — K1370 Unspecified lesions of oral mucosa: Secondary | ICD-10-CM

## 2013-04-10 NOTE — Telephone Encounter (Signed)
PT HAS APPOINTMENT 1115

## 2013-04-10 NOTE — Progress Notes (Signed)
  Subjective:    Patient ID: Lisa Waters, female    DOB: [birth year], 32 y.o.   MRN: [id number]  HPI He is here for evaluation of painful lesions that have occurred in her mouth for the last day or two. Prior to this she was seen for painful swallowing. Supportive care was recommended.  Review of Systems     Objective:   Physical Exam Exam of the mouth shows twowhitish ulcerated lesion on the oral mucosa. No other lesions are noted other than these two.       Assessment & Plan:  History of oral lesions  Case was discussed with Dr. Loreta Ave. My concern is the oral lesions are also once causing esophageal discomfort. Dr. Loreta Ave will see her.

## 2013-04-10 NOTE — Telephone Encounter (Signed)
Have her come in to see me today about the sores in the mouth

## 2013-04-10 NOTE — Telephone Encounter (Signed)
Pt called and still having pain in throat.  Not any better, she states she has some pretty bad sores in her mouth now.

## 2013-04-10 NOTE — Telephone Encounter (Signed)
Open in error

## 2013-04-11 ENCOUNTER — Other Ambulatory Visit: Payer: Self-pay | Admitting: Family Medicine

## 2013-04-11 HISTORY — PX: ESOPHAGOGASTRODUODENOSCOPY: SHX1529

## 2013-04-12 ENCOUNTER — Other Ambulatory Visit: Payer: Self-pay | Admitting: Medical

## 2013-04-15 ENCOUNTER — Encounter: Payer: Self-pay | Admitting: Internal Medicine

## 2013-04-16 ENCOUNTER — Encounter: Payer: Self-pay | Admitting: Medical

## 2013-04-22 ENCOUNTER — Encounter: Payer: Self-pay | Admitting: Cardiology

## 2013-05-06 ENCOUNTER — Other Ambulatory Visit: Payer: Managed Care, Other (non HMO)

## 2013-05-08 ENCOUNTER — Ambulatory Visit: Payer: Managed Care, Other (non HMO) | Admitting: Endocrinology

## 2013-05-08 DIAGNOSIS — Z0289 Encounter for other administrative examinations: Secondary | ICD-10-CM

## 2013-05-15 ENCOUNTER — Ambulatory Visit (INDEPENDENT_AMBULATORY_CARE_PROVIDER_SITE_OTHER): Payer: Managed Care, Other (non HMO) | Admitting: Family Medicine

## 2013-05-15 ENCOUNTER — Encounter: Payer: Self-pay | Admitting: Family Medicine

## 2013-05-15 VITALS — BP 130/80 | HR 70 | Wt 145.0 lb

## 2013-05-15 DIAGNOSIS — M25519 Pain in unspecified shoulder: Secondary | ICD-10-CM

## 2013-05-15 DIAGNOSIS — F430 Acute stress reaction: Secondary | ICD-10-CM

## 2013-05-15 DIAGNOSIS — M25512 Pain in left shoulder: Secondary | ICD-10-CM

## 2013-05-15 MED ORDER — TRAMADOL HCL 50 MG PO TABS: 50.0000 mg | ORAL_TABLET | Freq: Three times a day (TID) | ORAL | Status: DC | PRN

## 2013-05-15 MED ORDER — IBUPROFEN 800 MG PO TABS: 800.0000 mg | ORAL_TABLET | Freq: Three times a day (TID) | ORAL | Status: DC | PRN

## 2013-05-15 NOTE — Patient Instructions (Signed)
Call Darryl Hyers [phone] 

## 2013-05-15 NOTE — Progress Notes (Signed)
  Subjective:    Patient ID: Lisa Waters, female    DOB: 08-29-1980, 32 y.o.   MRN: [id number]  HPI She has had recent right shoulder surgery and subsequently has been using her left shoulder more is now here for left shoulder pain. She has noted a grinding sensation with motion of the shoulder. The pain got much worse in the last day .  At the end of the interactions she then became quite tearful for stating that she recently got a letter recommending she apply first also security disability. She has been through a lot in the last several months which has her quite upset. She expresses no suicidal ideation Review of Systems     Objective:   Physical Exam Passive range of motion of the left shoulder with some pain on active motion. No laxity noted. Drop arm test negative. Neer's and Hawkins test was slightly uncomfortable.       Assessment & Plan:  Left shoulder pain - Plan: ibuprofen (ADVIL,MOTRIN) 800 MG tablet, traMADol (ULTRAM) 50 MG tablet  Stress reaction  at the end of the encounter we discussed stress and she brought it up at that point. I did recommend counseling and return next week for further consultation.

## 2013-05-19 ENCOUNTER — Other Ambulatory Visit: Payer: Self-pay

## 2013-05-19 ENCOUNTER — Other Ambulatory Visit: Payer: Self-pay | Admitting: Family Medicine

## 2013-05-19 MED ORDER — LORAZEPAM 0.5 MG PO TABS: 0.5000 mg | ORAL_TABLET | Freq: Two times a day (BID) | ORAL | Status: DC | PRN

## 2013-05-19 NOTE — Telephone Encounter (Signed)
CALLED IN ATIVAN

## 2013-05-19 NOTE — Progress Notes (Signed)
She was seen recently by a Darryl Hyers. She is having a great deal of difficulty dealing with anxiety. Her present dosing of Zoloft is really not working. I will add Ativan to her regimen.

## 2013-05-22 ENCOUNTER — Ambulatory Visit (INDEPENDENT_AMBULATORY_CARE_PROVIDER_SITE_OTHER): Payer: Managed Care, Other (non HMO) | Admitting: Family Medicine

## 2013-05-22 DIAGNOSIS — F329 Major depressive disorder, single episode, unspecified: Secondary | ICD-10-CM

## 2013-05-22 DIAGNOSIS — F430 Acute stress reaction: Secondary | ICD-10-CM

## 2013-05-22 DIAGNOSIS — M25519 Pain in unspecified shoulder: Secondary | ICD-10-CM

## 2013-05-22 DIAGNOSIS — M25512 Pain in left shoulder: Secondary | ICD-10-CM

## 2013-05-22 MED ORDER — LIDOCAINE HCL 2 % IJ SOLN
3.0000 mL | Freq: Once | INTRAMUSCULAR | Status: AC
  Administered 2013-05-22: 60 mg via INTRADERMAL

## 2013-05-22 MED ORDER — METHYLPREDNISOLONE ACETATE 40 MG/ML IJ SUSP
40.0000 mg | Freq: Once | INTRAMUSCULAR | Status: AC
  Administered 2013-05-22: 40 mg via INTRAMUSCULAR

## 2013-05-22 MED ORDER — LORAZEPAM 1 MG PO TABS: 1.0000 mg | ORAL_TABLET | Freq: Two times a day (BID) | ORAL | Status: DC | PRN

## 2013-05-22 MED ORDER — SERTRALINE HCL 100 MG PO TABS: 100.0000 mg | ORAL_TABLET | Freq: Every day | ORAL | Status: DC

## 2013-05-22 NOTE — Progress Notes (Signed)
  Subjective:    Patient ID: Lisa Waters, female    DOB: 10/12/1980, 32 y.o.   MRN: [id number]  HPI She is here for recheck on her underlying anxiety/depression as well as left shoulder pain. She has been seen by Darryl Hyers. She was placed on Ativan 0.5 mg but thinks it has not helped her much. She continues on Zoloft. She feels overwhelmed and is desperate but not suicidal. She continues to complain of left shoulder pain. This was discussed on her last visit and most likely this is related to overuse and she had right shoulder surgery and subsequently needed to use her left shoulder more often.   Review of Systems     Objective:   Physical Exam Alert and tearful and somewhat desponded appearing. Good motion of the shoulder. Some crepitus noted with abduction and internal rotation. Negative sulcus sign. Negative drop arm test. No laxity noted.       Assessment & Plan:  Stress reaction - Plan: LORazepam (ATIVAN) 1 MG tablet  Major depression - Plan: sertraline (ZOLOFT) 100 MG tablet  Left shoulder pain - Plan: methylPREDNISolone acetate (DEPO-MEDROL) injection 40 mg, lidocaine (XYLOCAINE) 2 % (with pres) injection 60 mg  she will continue in counseling. I will increase her Ativan to 1 mg twice a day and increase her Zoloft to 150 mg. She is to call me at the beginning of the week. I am going to have a low threshold for referring her for psychiatric evaluation if she continues to have difficulty. Discuss the left shoulder pain and decided that was worthwhile giving her an injection to see if this would help with the pain. She was amenable to this. The left shoulder was prepped posteriorly with Betadine. 40 mg of Kenalog and 3 cc of Xylocaine was injected in the subacromial bursa without difficulty. She tolerated the procedure well. If no improvement, referral to orthopedics will be made.

## 2013-05-24 ENCOUNTER — Emergency Department (HOSPITAL_COMMUNITY): Payer: Managed Care, Other (non HMO)

## 2013-05-24 ENCOUNTER — Observation Stay (HOSPITAL_COMMUNITY)
Admission: EM | Admit: 2013-05-24 | Discharge: 2013-05-25 | Disposition: A | Discharge status: Home / Self Care | Payer: Managed Care, Other (non HMO) | Attending: Internal Medicine | Admitting: Internal Medicine

## 2013-05-24 DIAGNOSIS — Z79899 Other long term (current) drug therapy: Secondary | ICD-10-CM | POA: Insufficient documentation

## 2013-05-24 DIAGNOSIS — C8190 Hodgkin lymphoma, unspecified, unspecified site: Secondary | ICD-10-CM

## 2013-05-24 DIAGNOSIS — G43909 Migraine, unspecified, not intractable, without status migrainosus: Secondary | ICD-10-CM | POA: Insufficient documentation

## 2013-05-24 DIAGNOSIS — R0681 Apnea, not elsewhere classified: Principal | ICD-10-CM | POA: Insufficient documentation

## 2013-05-24 DIAGNOSIS — Z87891 Personal history of nicotine dependence: Secondary | ICD-10-CM | POA: Insufficient documentation

## 2013-05-24 DIAGNOSIS — Z7982 Long term (current) use of aspirin: Secondary | ICD-10-CM | POA: Insufficient documentation

## 2013-05-24 DIAGNOSIS — K219 Gastro-esophageal reflux disease without esophagitis: Secondary | ICD-10-CM | POA: Diagnosis not present

## 2013-05-24 DIAGNOSIS — R071 Chest pain on breathing: Secondary | ICD-10-CM | POA: Diagnosis present

## 2013-05-24 DIAGNOSIS — Z885 Allergy status to narcotic agent status: Secondary | ICD-10-CM | POA: Insufficient documentation

## 2013-05-24 DIAGNOSIS — Z3202 Encounter for pregnancy test, result negative: Secondary | ICD-10-CM | POA: Diagnosis not present

## 2013-05-24 DIAGNOSIS — E039 Hypothyroidism, unspecified: Secondary | ICD-10-CM | POA: Insufficient documentation

## 2013-05-24 DIAGNOSIS — Z8669 Personal history of other diseases of the nervous system and sense organs: Secondary | ICD-10-CM | POA: Diagnosis not present

## 2013-05-24 DIAGNOSIS — F419 Anxiety disorder, unspecified: Secondary | ICD-10-CM | POA: Diagnosis present

## 2013-05-24 DIAGNOSIS — Z888 Allergy status to other drugs, medicaments and biological substances status: Secondary | ICD-10-CM | POA: Insufficient documentation

## 2013-05-24 DIAGNOSIS — I7300 Raynaud's syndrome without gangrene: Secondary | ICD-10-CM

## 2013-05-24 DIAGNOSIS — F411 Generalized anxiety disorder: Secondary | ICD-10-CM

## 2013-05-24 DIAGNOSIS — E876 Hypokalemia: Secondary | ICD-10-CM | POA: Diagnosis present

## 2013-05-24 DIAGNOSIS — R002 Palpitations: Secondary | ICD-10-CM

## 2013-05-24 DIAGNOSIS — F3289 Other specified depressive episodes: Secondary | ICD-10-CM | POA: Diagnosis not present

## 2013-05-24 DIAGNOSIS — F339 Major depressive disorder, recurrent, unspecified: Secondary | ICD-10-CM

## 2013-05-24 DIAGNOSIS — J45909 Unspecified asthma, uncomplicated: Secondary | ICD-10-CM | POA: Diagnosis not present

## 2013-05-24 DIAGNOSIS — R4182 Altered mental status, unspecified: Secondary | ICD-10-CM | POA: Diagnosis present

## 2013-05-24 DIAGNOSIS — R053 Chronic cough: Secondary | ICD-10-CM

## 2013-05-24 DIAGNOSIS — F329 Major depressive disorder, single episode, unspecified: Secondary | ICD-10-CM | POA: Diagnosis not present

## 2013-05-24 DIAGNOSIS — R230 Cyanosis: Secondary | ICD-10-CM | POA: Diagnosis not present

## 2013-05-24 DIAGNOSIS — I059 Rheumatic mitral valve disease, unspecified: Secondary | ICD-10-CM | POA: Insufficient documentation

## 2013-05-24 DIAGNOSIS — Z8571 Personal history of Hodgkin lymphoma: Secondary | ICD-10-CM | POA: Diagnosis not present

## 2013-05-24 DIAGNOSIS — I341 Nonrheumatic mitral (valve) prolapse: Secondary | ICD-10-CM

## 2013-05-24 DIAGNOSIS — F430 Acute stress reaction: Secondary | ICD-10-CM

## 2013-05-24 DIAGNOSIS — R05 Cough: Secondary | ICD-10-CM

## 2013-05-24 DIAGNOSIS — I340 Nonrheumatic mitral (valve) insufficiency: Secondary | ICD-10-CM

## 2013-05-24 DIAGNOSIS — R4189 Other symptoms and signs involving cognitive functions and awareness: Secondary | ICD-10-CM

## 2013-05-24 HISTORY — DX: Unspecified asthma, uncomplicated: J45.909

## 2013-05-24 LAB — COMPREHENSIVE METABOLIC PANEL
ALT: 9 U/L (ref 0–35)
AST: 23 U/L (ref 0–37)
Albumin: 4.9 g/dL (ref 3.5–5.2)
Alkaline Phosphatase: 49 U/L (ref 39–117)
BUN: 7 mg/dL (ref 6–23)
CO2: 21 mEq/L (ref 19–32)
Calcium: 9.9 mg/dL (ref 8.4–10.5)
Creatinine, Ser: 0.86 mg/dL (ref 0.50–1.10)
GFR calc Af Amer: >90 mL/min (ref >90)
GFR calc non Af Amer: 88 mL/min — ABNORMAL LOW (ref >90)
Glucose, Bld: 98 mg/dL (ref 70–99)
Sodium: 133 mEq/L — ABNORMAL LOW (ref 135–145)
Total Protein: 8.3 g/dL (ref 6.0–8.3)

## 2013-05-24 LAB — CBC
HCT: 41.1 % (ref 36.0–46.0)
Hemoglobin: 14.6 g/dL (ref 12.0–15.0)
MCH: 31.7 pg (ref 26.0–34.0)
MCHC: 35.5 g/dL (ref 30.0–36.0)
MCV: 89.2 fL (ref 78.0–100.0)
Platelets: 239 10*3/uL (ref 150–400)
RBC: 4.61 MIL/uL (ref 3.87–5.11)
WBC: 8 10*3/uL (ref 4.0–10.5)

## 2013-05-24 LAB — ETHANOL: Alcohol, Ethyl (B): 267 mg/dL — ABNORMAL HIGH (ref 0–11)

## 2013-05-24 NOTE — ED Notes (Signed)
Pt was being brought to the ED by family. Pt was dropped off by incorrect entrance near the morgue. Nurse that arrived first on scene reported patient went unresponsive, pulseless, and apneic twice. Pt currently alert to self and crying. Family member reports patient recently had some of her prescriptions changed by her MD.

## 2013-05-24 NOTE — ED Notes (Signed)
Family reports her MD recently up her Zoloft and Ativan Dosage. Family also report she was prescribed a narcotic, but he is unsure of the name and dose.

## 2013-05-24 NOTE — ED Notes (Signed)
Per Staff:

## 2013-05-24 NOTE — ED Provider Notes (Signed)
CSN: [id number]     Arrival date & time 05/24/13  2250 History   First MD Initiated Contact with Patient 05/24/13 2302     Chief Complaint  Patient presents with  . Altered Mental Status   (Consider location/radiation/quality/duration/timing/severity/associated sxs/prior Treatment) HPI History limited by patient condition of altered mental status, critical acuity.   Pt here via rapid response with report of cyanosis, pulseless. Oxygen provided via respiratory therapy with improvement. Pt was brought to entrance near morgue and pharmacy staff found patient to be cyanotic, called rapid response for help.   Additional history obtained from husband at bedside. He states pt with recent worsening of anxiety with increase in ativan and Zoloft. Tonight pt and husband went to sports bar to watch game. Husband states pt had a small amount of alcohol. He noted several episodes of apnea, unresponsiveness and brought to hospital but to wrong entrance.  Past Medical History  Diagnosis Date  . History of hodgkin's lymphoma 2007    in remission, sees [facility], Dr. Greggory Stallion; prior chemotherapy and radiation therapy; sees yearly in December  . Hypothyroidism   . Depression   . Anxiety   . GERD (gastroesophageal reflux disease)   . MVP (mitral valve prolapse)     Dr. Herbie Baltimore, [facility]; Moderate  MR; EF 60-65%  . Migraine     associated ocular symptoms, Dr. Brandon Melnick, [facility]  . Abnormal finding on chest xray 09/2012    COPD changes with right upper lobe scarring  . Palpitations 4/14    holter monitor, Dr. Herbie Baltimore; no arrhythmia   . Moderate mitral regurgitation by prior echocardiogram 09/2012  . History of mammogram 12/2012    normal  . H/O amaurosis fugax Fall 2012    MRI of brain/brain stem: no acute abnormality, small area of encephalomalacia left superior cerebellum that could be prior trauma, infection, or lacunar infarct  . Reactive airway disease with wheezing     PFTs March 2014: mild obstructive  defect, normal volumes, normal diffusion, +response to bronchodilator, FEV1 96% predicted, FEV1/FVC 69%; Pulmonology consult 4/14, thought to be related to non-selective beta blockers - she had these symptoms before beta blockers.  Pindolol was changed to Bystolic   Past Surgical History  Procedure Laterality Date  . Appendectomy    . Breast enhancement surgery    . Rhinoplasty  deviated septum  . Lymphnode biopsy    . Doppler echocardiography  09/30/12    LVEF 60-65%, wall motion normal, LV function normal, moderate MVP, moderate regurgitation, no shunt; Dr.   . Esophagogastroduodenoscopy  04/11/13    [facility] Dr. [address]   Family History  Problem Relation Age of Onset  . Skin cancer Father   . Emphysema Paternal Grandfather     was a smoker  . Diabetes Maternal Aunt   . Stroke Maternal Grandmother   . Cancer Paternal Grandmother     metastasis  . Heart disease Neg Hx   . Hypertension Neg Hx   . Hyperlipidemia Neg Hx    History  Substance Use Topics  . Smoking status: Former Smoker -- 1.00 packs/day for 10 years    Types: Cigarettes    Quit date: 11/07/2009  . Smokeless tobacco: Never Used  . Alcohol Use: No     Comment: occasional   OB History   Grav Para Term Preterm Abortions TAB SAB Ect Mult Living   0 0 0 0 0 0 0 0 0 0      Review of Systems History limited  by patient condition of altered mental status, critical acuity.  Allergies  Codeine; Iohexol; and Pindolol  Home Medications   Current Outpatient Rx  Name  Route  Sig  Dispense  Refill  . aspirin 81 MG tablet   Oral   Take 81 mg by mouth daily.         . famotidine (PEPCID) 20 MG tablet      One at bedtime   30 tablet   11   . levothyroxine (SYNTHROID, LEVOTHROID) 88 MCG tablet   Oral   Take 88 mcg by mouth daily before breakfast.         . LORazepam (ATIVAN) 1 MG tablet   Oral   Take 1 tablet (1 mg total) by mouth 2 (two) times daily as needed for anxiety.   20 tablet   1    . nebivolol (BYSTOLIC) 5 MG tablet   Oral   Take 1 tablet (5 mg total) by mouth daily.   90 tablet   3   . omeprazole (PRILOSEC) 40 MG capsule   Oral   Take 1 capsule (40 mg total) by mouth daily.   90 capsule   0   . sertraline (ZOLOFT) 100 MG tablet   Oral   Take 150 mg by mouth daily. 1-1/2 pills per day         . hyoscyamine (LEVBID) 0.375 MG 12 hr tablet   Oral   Take 1 tablet (0.375 mg total) by mouth every 12 (twelve) hours as needed for cramping.   20 tablet   0   . ibuprofen (ADVIL,MOTRIN) 800 MG tablet   Oral   Take 1 tablet (800 mg total) by mouth every 8 (eight) hours as needed for pain.   30 tablet   0   . SUMAtriptan (IMITREX) 100 MG tablet   Oral   Take 1 tablet (100 mg total) by mouth every 2 (two) hours as needed for migraine. Not to exceed more than 2 tablets per 24 hours   10 tablet   1   . traMADol (ULTRAM) 50 MG tablet   Oral   Take 1 tablet (50 mg total) by mouth every 8 (eight) hours as needed for pain.   30 tablet   0    BP 97/57  Pulse 87  Temp(Src) 99 F (37.2 C) (Oral)  Resp 19  SpO2 100%  LMP 05/09/2013 Physical Exam  Neurological: No sensory deficit. She exhibits normal muscle tone. She displays no seizure activity. GCS eye subscore is 2. GCS verbal subscore is 4. GCS motor subscore is 6.   Nursing note and vitals reviewed.  Constitutional: Pt is crying, breathing spontaneously.  Eyes: No injection, no scleral icterus. HENT: Atraumatic, airway open without erythema or exudate.  Respiratory: Rapid shallow breaths. Clear sounds bilaterally.  Cardiovascular: Regular rhythm. Extremities warm and well perfused.  Abdomen: Soft, non-tender. MSK: Extremities are atraumatic without deformity. Skin: No rash, no wounds.        ED Course  Procedures (including critical care time) Labs Review Labs Reviewed  COMPREHENSIVE METABOLIC PANEL - Abnormal; Notable for the following:    Sodium 133 (*)    Potassium 3.4 (*)    Chloride  91 (*)    Total Bilirubin 0.2 (*)    GFR calc non Af Amer 88 (*)    All other components within normal limits  URINALYSIS, ROUTINE W REFLEX MICROSCOPIC - Abnormal; Notable for the following:    Specific Gravity, Urine 1.004 (*)  All other components within normal limits  ETHANOL - Abnormal; Notable for the following:    Alcohol, Ethyl (B) 267 (*)    All other components within normal limits  CBC  URINE RAPID DRUG SCREEN (HOSP PERFORMED)  POCT PREGNANCY, URINE   Imaging Review Ct Head Wo Contrast (only If Suspected Head Trauma And/or Pt Is On Anticoagulant)  05/25/2013   CLINICAL DATA:  Unresponsive, apneic  EXAM: CT HEAD WITHOUT CONTRAST  TECHNIQUE: Contiguous axial images were obtained from the base of the skull through the vertex without intravenous contrast.  COMPARISON:  Prior CT from 05/21/2012  FINDINGS: No acute intracranial hemorrhage or infarct. Gray-white matter differentiation is well maintained. The CSF containing spaces are normal without evidence of diffuse edema. No hydrocephalus. No extra-axial fluid collection. No mass or midline shift. Calvarium is intact. Orbits are normal. Paranasal sinuses and mastoid air cells are clear.  IMPRESSION: No acute intracranial abnormality.   Electronically Signed   By: Rise Mu M.D.   On: 05/25/2013 00:47   Dg Chest Portable 1 View  05/24/2013   CLINICAL DATA:  Altered mental status.  EXAM: PORTABLE CHEST - 1 VIEW  COMPARISON:  Chest radiograph performed 10/07/2012  FINDINGS: The lungs are well-aerated and clear. There is no evidence of focal opacification, pleural effusion or pneumothorax.  The cardiomediastinal silhouette is within normal limits. No acute osseous abnormalities are seen. External pacing pads are noted overlying the left hemithorax.  IMPRESSION: No acute cardiopulmonary process seen.   Electronically Signed   By: Roanna Raider M.D.   On: 05/24/2013 23:52    EKG Interpretation   None       MDM   1. Apnea    2. Cyanosis    32 y.o. female presents as a respiratory distress due to cyanosis, apnea. Arrived via rapid response who report patient was unresponsive, cyanotic with apnea. Here pt breathing on her own, pulses present. Supplemental oxygen provided. FSBS normal. EKG without acute ischemia. Plan for altered mental status work up. Uncertain etiology of event. Given acuity of events will discuss with hospitalist for observation.   Results reviewed. Xray without consolidation. CT head NAICA. EtOH elevated. Pt interviewed separate from husband and states she had very little alcohol. CMP without renal or liver failure. CBC normal. UDS neg. UA without infection. U preg. Neg. Pt stable, GCS 15 on re-eval. Pt, family very concerned about event. Hospitalist called who will come see patient here in ED.       I independently viewed, interpreted, and used in my medical decision making all ordered lab and imaging tests. Medical Decision Making discussed with ED attending Rolan Bucco, MD     Charm Barges, MD 05/25/13 0130

## 2013-05-25 ENCOUNTER — Encounter (HOSPITAL_COMMUNITY): Payer: Self-pay | Admitting: Internal Medicine

## 2013-05-25 DIAGNOSIS — F419 Anxiety disorder, unspecified: Secondary | ICD-10-CM | POA: Diagnosis present

## 2013-05-25 DIAGNOSIS — F329 Major depressive disorder, single episode, unspecified: Secondary | ICD-10-CM

## 2013-05-25 DIAGNOSIS — F411 Generalized anxiety disorder: Secondary | ICD-10-CM

## 2013-05-25 DIAGNOSIS — I059 Rheumatic mitral valve disease, unspecified: Secondary | ICD-10-CM

## 2013-05-25 DIAGNOSIS — R071 Chest pain on breathing: Secondary | ICD-10-CM | POA: Diagnosis present

## 2013-05-25 DIAGNOSIS — F1994 Other psychoactive substance use, unspecified with psychoactive substance-induced mood disorder: Secondary | ICD-10-CM

## 2013-05-25 DIAGNOSIS — R0681 Apnea, not elsewhere classified: Secondary | ICD-10-CM

## 2013-05-25 DIAGNOSIS — F410 Panic disorder [episodic paroxysmal anxiety] without agoraphobia: Secondary | ICD-10-CM

## 2013-05-25 DIAGNOSIS — E876 Hypokalemia: Secondary | ICD-10-CM | POA: Diagnosis present

## 2013-05-25 DIAGNOSIS — F3289 Other specified depressive episodes: Secondary | ICD-10-CM

## 2013-05-25 DIAGNOSIS — R404 Transient alteration of awareness: Secondary | ICD-10-CM

## 2013-05-25 DIAGNOSIS — F339 Major depressive disorder, recurrent, unspecified: Secondary | ICD-10-CM

## 2013-05-25 DIAGNOSIS — F1010 Alcohol abuse, uncomplicated: Secondary | ICD-10-CM

## 2013-05-25 LAB — RAPID URINE DRUG SCREEN, HOSP PERFORMED
Cocaine: NOT DETECTED
Tetrahydrocannabinol: NOT DETECTED

## 2013-05-25 LAB — TROPONIN I
Troponin I: <0.3 ng/mL (ref <0.30)
Troponin I: <0.3 ng/mL (ref <0.30)

## 2013-05-25 LAB — URINALYSIS, ROUTINE W REFLEX MICROSCOPIC
Bilirubin Urine: NEGATIVE
Glucose, UA: NEGATIVE mg/dL
Ketones, ur: NEGATIVE mg/dL
Leukocytes, UA: NEGATIVE
Nitrite: NEGATIVE
Specific Gravity, Urine: 1.004 — ABNORMAL LOW (ref 1.005–1.030)
Urobilinogen, UA: 0.2 mg/dL (ref 0.0–1.0)
pH: 7 (ref 5.0–8.0)

## 2013-05-25 LAB — CBC
Hemoglobin: 12.8 g/dL (ref 12.0–15.0)
MCH: 31.4 pg (ref 26.0–34.0)
RBC: 4.08 MIL/uL (ref 3.87–5.11)
RDW: 12.7 % (ref 11.5–15.5)
WBC: 5.5 10*3/uL (ref 4.0–10.5)

## 2013-05-25 LAB — COMPREHENSIVE METABOLIC PANEL
AST: 19 U/L (ref 0–37)
Alkaline Phosphatase: 40 U/L (ref 39–117)
BUN: 7 mg/dL (ref 6–23)
CO2: 23 mEq/L (ref 19–32)
Calcium: 8.6 mg/dL (ref 8.4–10.5)
Chloride: 102 mEq/L (ref 96–112)
Creatinine, Ser: 0.86 mg/dL (ref 0.50–1.10)
GFR calc Af Amer: >90 mL/min (ref >90)
GFR calc non Af Amer: 88 mL/min — ABNORMAL LOW (ref >90)
Total Bilirubin: 0.2 mg/dL — ABNORMAL LOW (ref 0.3–1.2)

## 2013-05-25 LAB — TSH: TSH: 1.197 u[IU]/mL (ref 0.350–4.500)

## 2013-05-25 LAB — PHOSPHORUS: Phosphorus: 4.3 mg/dL (ref 2.3–4.6)

## 2013-05-25 LAB — POCT PREGNANCY, URINE: Preg Test, Ur: NEGATIVE

## 2013-05-25 MED ORDER — ASPIRIN 81 MG PO CHEW
81.0000 mg | CHEWABLE_TABLET | Freq: Every day | ORAL | Status: DC
  Administered 2013-05-25: 81 mg via ORAL
  Filled 2013-05-25 (×2): qty 1

## 2013-05-25 MED ORDER — LORAZEPAM 0.5 MG PO TABS
0.5000 mg | ORAL_TABLET | Freq: Two times a day (BID) | ORAL | Status: DC | PRN
  Administered 2013-05-25: 0.5 mg via ORAL
  Filled 2013-05-25: qty 1

## 2013-05-25 MED ORDER — SERTRALINE HCL 50 MG PO TABS
150.0000 mg | ORAL_TABLET | Freq: Every day | ORAL | Status: DC
  Administered 2013-05-25: 150 mg via ORAL
  Filled 2013-05-25: qty 1

## 2013-05-25 MED ORDER — ONDANSETRON HCL 4 MG PO TABS: 4.0000 mg | ORAL_TABLET | Freq: Four times a day (QID) | ORAL | Status: DC | PRN

## 2013-05-25 MED ORDER — TRAMADOL HCL 50 MG PO TABS
50.0000 mg | ORAL_TABLET | Freq: Three times a day (TID) | ORAL | Status: DC | PRN
  Administered 2013-05-25: 50 mg via ORAL
  Filled 2013-05-25: qty 1

## 2013-05-25 MED ORDER — HYDROCODONE-ACETAMINOPHEN 5-325 MG PO TABS: 1.0000 | ORAL_TABLET | ORAL | Status: DC | PRN

## 2013-05-25 MED ORDER — ESCITALOPRAM OXALATE 10 MG PO TABS: 10.0000 mg | ORAL_TABLET | Freq: Every day | ORAL | Status: DC

## 2013-05-25 MED ORDER — ACETAMINOPHEN 325 MG PO TABS: 650.0000 mg | ORAL_TABLET | Freq: Four times a day (QID) | ORAL | Status: DC | PRN

## 2013-05-25 MED ORDER — ACETAMINOPHEN 650 MG RE SUPP: 650.0000 mg | Freq: Four times a day (QID) | RECTAL | Status: DC | PRN

## 2013-05-25 MED ORDER — ONDANSETRON HCL 4 MG/2ML IJ SOLN: 4.0000 mg | Freq: Four times a day (QID) | INTRAMUSCULAR | Status: DC | PRN

## 2013-05-25 MED ORDER — SODIUM CHLORIDE 0.9 % IV BOLUS (SEPSIS)
1000.0000 mL | Freq: Once | INTRAVENOUS | Status: AC
  Administered 2013-05-25: 1000 mL via INTRAVENOUS

## 2013-05-25 MED ORDER — DOCUSATE SODIUM 100 MG PO CAPS
100.0000 mg | ORAL_CAPSULE | Freq: Two times a day (BID) | ORAL | Status: DC
  Administered 2013-05-25: 100 mg via ORAL
  Filled 2013-05-25 (×2): qty 1

## 2013-05-25 MED ORDER — NEBIVOLOL HCL 2.5 MG PO TABS
2.5000 mg | ORAL_TABLET | Freq: Every day | ORAL | Status: DC
  Administered 2013-05-25: 2.5 mg via ORAL
  Filled 2013-05-25: qty 1

## 2013-05-25 MED ORDER — POTASSIUM CHLORIDE CRYS ER 20 MEQ PO TBCR
40.0000 meq | EXTENDED_RELEASE_TABLET | Freq: Once | ORAL | Status: AC
  Administered 2013-05-25: 40 meq via ORAL
  Filled 2013-05-25: qty 2

## 2013-05-25 MED ORDER — SODIUM CHLORIDE 0.9 % IV SOLN
INTRAVENOUS | Status: AC
  Administered 2013-05-25: 1000 mL via INTRAVENOUS

## 2013-05-25 MED ORDER — PANTOPRAZOLE SODIUM 40 MG PO TBEC
40.0000 mg | DELAYED_RELEASE_TABLET | Freq: Every day | ORAL | Status: DC
  Administered 2013-05-25: 40 mg via ORAL
  Filled 2013-05-25: qty 1

## 2013-05-25 MED ORDER — ENOXAPARIN SODIUM 40 MG/0.4ML ~~LOC~~ SOLN
40.0000 mg | SUBCUTANEOUS | Status: DC
  Filled 2013-05-25: qty 0.4

## 2013-05-25 MED ORDER — FAMOTIDINE 20 MG PO TABS
20.0000 mg | ORAL_TABLET | Freq: Every day | ORAL | Status: DC
  Administered 2013-05-25: 20 mg via ORAL
  Filled 2013-05-25: qty 1

## 2013-05-25 MED ORDER — SODIUM CHLORIDE 0.9 % IJ SOLN
3.0000 mL | Freq: Two times a day (BID) | INTRAMUSCULAR | Status: DC
  Administered 2013-05-25 (×2): 3 mL via INTRAVENOUS

## 2013-05-25 MED ORDER — LEVOTHYROXINE SODIUM 88 MCG PO TABS
88.0000 ug | ORAL_TABLET | Freq: Every day | ORAL | Status: DC
  Administered 2013-05-25: 88 ug via ORAL
  Filled 2013-05-25 (×2): qty 1

## 2013-05-25 MED ORDER — LORAZEPAM 1 MG PO TABS: 0.5000 mg | ORAL_TABLET | Freq: Two times a day (BID) | ORAL | Status: DC | PRN

## 2013-05-25 NOTE — Consult Note (Signed)
[facility] Face-to-Face Psychiatry Consult   Reason for Consult:  anxiety Referring Physician:  Unit Physician  Lisa Waters is an 32 y.o. female.  Assessment: AXIS I:  Depressive Disorder NOS, Panic Disorder, Substance Induced Mood Disorder and Alcohol use disorder AXIS II:  Deferred AXIS III:   Past Medical History  Diagnosis Date  . History of hodgkin's lymphoma 2007    in remission, sees [facility], Dr. Greggory Stallion; prior chemotherapy and radiation therapy; sees yearly in December  . Hypothyroidism   . Depression   . Anxiety   . GERD (gastroesophageal reflux disease)   . MVP (mitral valve prolapse)     Dr. Herbie Baltimore, [facility]; Moderate  MR; EF 60-65%  . Migraine     associated ocular symptoms, Dr. Brandon Melnick, [facility]  . Abnormal finding on chest xray 09/2012    COPD changes with right upper lobe scarring  . Palpitations 4/14    holter monitor, Dr. Herbie Baltimore; no arrhythmia   . Moderate mitral regurgitation by prior echocardiogram 09/2012  . History of mammogram 12/2012    normal  . H/O amaurosis fugax Fall 2012    MRI of brain/brain stem: no acute abnormality, small area of encephalomalacia left superior cerebellum that could be prior trauma, infection, or lacunar infarct  . Reactive airway disease with wheezing     PFTs March 2014: mild obstructive defect, normal volumes, normal diffusion, +response to bronchodilator, FEV1 96% predicted, FEV1/FVC 69%; Pulmonology consult 4/14, thought to be related to non-selective beta blockers - she had these symptoms before beta blockers.  Pindolol was changed to Bystolic  . Asthma    AXIS IV:  occupational problems, other psychosocial or environmental problems and problems related to social environment AXIS V:  51-60 moderate symptoms  Plan:  No evidence of imminent risk to self or others at present.   Patient does not meet criteria for psychiatric inpatient admission. Supportive therapy provided about ongoing stressors. Refer to IOP. See further plan  below.  Subjective:   Lisa Waters is a 32 y.o. female patient admitted with alcohol intoxication. Remorseful says she was is not clear what happened. Says she is not a heavy drinker  HPI:  32 y/o female with PMH of hodgkin's lymphoma (2007), Hypothyroidism, Depression, Anxiety, GERD presented with ETOH intoxication rapid response was called for unresponsiveness and brought to hospital but upon arrival to ED patient was awake, anxious, confused  Patient says has been feeling depressed and recently her Primary care increased her zoloft to 150mg  qd and ativan 0.5mg  2bid. Urine shows negative for benzo. She has increased her alcohol intake for the last 3months. Says she suffers from panic, difficult to breath and palpitations. Alcohol minimizes her symptoms "that helps" but understand it was a bad judjement.  Drinks 3-4 drinks a day. Can be wine or equivalent.  Follws with Dr. Athena Masse therapist for panic symptoms. Says me medical issues keep me down everytime i come i get another diagnosis.  Past history of lymphoma at that time also got depressed but did not follow with psychiatrist or medications. Has been seeing PCP for anti -depressants. Denies psychotic and manic symptoms currently or in past. HPI Elements:   Location:  hospital. Quality:  moderate. Severity:  alcohol intoxication.  Past Psychiatric History: Past Medical History  Diagnosis Date  . History of hodgkin's lymphoma 2007    in remission, sees [facility], Dr. Greggory Stallion; prior chemotherapy and radiation therapy; sees yearly in December  . Hypothyroidism   . Depression   . Anxiety   .  GERD (gastroesophageal reflux disease)   . MVP (mitral valve prolapse)     Dr. Herbie Baltimore, [facility]; Moderate  MR; EF 60-65%  . Migraine     associated ocular symptoms, Dr. Brandon Melnick, [facility]  . Abnormal finding on chest xray 09/2012    COPD changes with right upper lobe scarring  . Palpitations 4/14    holter monitor, Dr. Herbie Baltimore; no arrhythmia   .  Moderate mitral regurgitation by prior echocardiogram 09/2012  . History of mammogram 12/2012    normal  . H/O amaurosis fugax Fall 2012    MRI of brain/brain stem: no acute abnormality, small area of encephalomalacia left superior cerebellum that could be prior trauma, infection, or lacunar infarct  . Reactive airway disease with wheezing     PFTs March 2014: mild obstructive defect, normal volumes, normal diffusion, +response to bronchodilator, FEV1 96% predicted, FEV1/FVC 69%; Pulmonology consult 4/14, thought to be related to non-selective beta blockers - she had these symptoms before beta blockers.  Pindolol was changed to Bystolic  . Asthma     reports that she quit smoking about 3 years ago. Her smoking use included Cigarettes. She has a 10 pack-year smoking history. She has never used smokeless tobacco. She reports that she does not drink alcohol or use illicit drugs. Family History  Problem Relation Age of Onset  . Skin cancer Father   . Emphysema Paternal Grandfather     was a smoker  . Diabetes Maternal Aunt   . Stroke Maternal Grandmother   . Cancer Paternal Grandmother     metastasis  . Heart disease Neg Hx   . Hypertension Neg Hx   . Hyperlipidemia Neg Hx      Living Arrangements: Spouse/significant other   Abuse/Neglect [facility]) Physical Abuse: Denies Verbal Abuse: Denies Sexual Abuse: Denies Allergies:   Allergies  Allergen Reactions  . Codeine Itching  . Iohexol Hives  . Pindolol     Avoid non selective beta blockers due to dyspnea    ACT Assessment Complete:  No:   Past Psychiatric History: Diagnosis:  Depression NOS. Alcohol use disorder. Possible panic disorder  Hospitalizations:  denies  Outpatient Care:  Therapy only  Substance Abuse Care:  denies  Self-Mutilation:  denies  Suicidal Attempts:  denies  Homicidal Behaviors:  denies   Violent Behaviors:  denies   Place of Residence:  local Marital Status:  married Employed/Unemployed:   unemployed Education:  See chart Family Supports:  married Objective: Blood pressure 96/55, pulse 75, temperature 98.7 F (37.1 C), temperature source Oral, resp. rate 16, height 5\' 7"  (1.702 m), weight 65.772 kg (145 lb), last menstrual period 05/09/2013, SpO2 100.00%.Body mass index is 22.71 kg/(m^2). Results for orders placed during the hospital encounter of 05/24/13 (from the past 72 hour(s))  CBC     Status: None   Collection Time    05/24/13 10:54 PM      Result Value Range   WBC 8.0  4.0 - 10.5 K/uL   RBC 4.61  3.87 - 5.11 MIL/uL   Hemoglobin 14.6  12.0 - 15.0 g/dL   HCT 40.9  81.1 - 91.4 %   MCV 89.2  78.0 - 100.0 fL   MCH 31.7  26.0 - 34.0 pg   MCHC 35.5  30.0 - 36.0 g/dL   RDW 78.2  95.6 - 21.3 %   Platelets 239  150 - 400 K/uL  COMPREHENSIVE METABOLIC PANEL     Status: Abnormal   Collection Time    05/24/13  10:54 PM      Result Value Range   Sodium 133 (*) 135 - 145 mEq/L   Potassium 3.4 (*) 3.5 - 5.1 mEq/L   Chloride 91 (*) 96 - 112 mEq/L   CO2 21  19 - 32 mEq/L   Glucose, Bld 98  70 - 99 mg/dL   BUN 7  6 - 23 mg/dL   Creatinine, Ser 1.19  0.50 - 1.10 mg/dL   Calcium 9.9  8.4 - 14.7 mg/dL   Total Protein 8.3  6.0 - 8.3 g/dL   Albumin 4.9  3.5 - 5.2 g/dL   AST 23  0 - 37 U/L   ALT 9  0 - 35 U/L   Alkaline Phosphatase 49  39 - 117 U/L   Total Bilirubin 0.2 (*) 0.3 - 1.2 mg/dL   GFR calc non Af Amer 88 (*) >90 mL/min   GFR calc Af Amer >90  >90 mL/min   Comment: (NOTE)     The eGFR has been calculated using the CKD EPI equation.     This calculation has not been validated in all clinical situations.     eGFR's persistently <90 mL/min signify possible Chronic Kidney     Disease.  ETHANOL     Status: Abnormal   Collection Time    05/24/13 10:54 PM      Result Value Range   Alcohol, Ethyl (B) 267 (*) 0 - 11 mg/dL   Comment:            LOWEST DETECTABLE LIMIT FOR     SERUM ALCOHOL IS 11 mg/dL     FOR MEDICAL PURPOSES ONLY  URINALYSIS, ROUTINE W REFLEX  MICROSCOPIC     Status: Abnormal   Collection Time    05/24/13 11:50 PM      Result Value Range   Color, Urine YELLOW  YELLOW   APPearance CLEAR  CLEAR   Specific Gravity, Urine 1.004 (*) 1.005 - 1.030   pH 7.0  5.0 - 8.0   Glucose, UA NEGATIVE  NEGATIVE mg/dL   Hgb urine dipstick NEGATIVE  NEGATIVE   Bilirubin Urine NEGATIVE  NEGATIVE   Ketones, ur NEGATIVE  NEGATIVE mg/dL   Protein, ur NEGATIVE  NEGATIVE mg/dL   Urobilinogen, UA 0.2  0.0 - 1.0 mg/dL   Nitrite NEGATIVE  NEGATIVE   Leukocytes, UA NEGATIVE  NEGATIVE   Comment: MICROSCOPIC NOT DONE ON URINES WITH NEGATIVE PROTEIN, BLOOD, LEUKOCYTES, NITRITE, OR GLUCOSE <1000 mg/dL.  URINE RAPID DRUG SCREEN (HOSP PERFORMED)     Status: None   Collection Time    05/24/13 11:50 PM      Result Value Range   Opiates NONE DETECTED  NONE DETECTED   Cocaine NONE DETECTED  NONE DETECTED   Benzodiazepines NONE DETECTED  NONE DETECTED   Amphetamines NONE DETECTED  NONE DETECTED   Tetrahydrocannabinol NONE DETECTED  NONE DETECTED   Barbiturates NONE DETECTED  NONE DETECTED   Comment:            DRUG SCREEN FOR MEDICAL PURPOSES     ONLY.  IF CONFIRMATION IS NEEDED     FOR ANY PURPOSE, NOTIFY LAB     WITHIN 5 DAYS.                LOWEST DETECTABLE LIMITS     FOR URINE DRUG SCREEN     Drug Class       Cutoff (ng/mL)     Amphetamine      1000  Barbiturate      200     Benzodiazepine   200     Tricyclics       300     Opiates          300     Cocaine          300     THC              50  POCT PREGNANCY, URINE     Status: None   Collection Time    05/25/13 12:11 AM      Result Value Range   Preg Test, Ur NEGATIVE  NEGATIVE   Comment:            THE SENSITIVITY OF THIS     METHODOLOGY IS >24 mIU/mL  MAGNESIUM     Status: None   Collection Time    05/25/13  6:20 AM      Result Value Range   Magnesium 1.9  1.5 - 2.5 mg/dL  PHOSPHORUS     Status: None   Collection Time    05/25/13  6:20 AM      Result Value Range   Phosphorus  4.3  2.3 - 4.6 mg/dL  COMPREHENSIVE METABOLIC PANEL     Status: Abnormal   Collection Time    05/25/13  6:20 AM      Result Value Range   Sodium 140  135 - 145 mEq/L   Potassium 4.2  3.5 - 5.1 mEq/L   Chloride 102  96 - 112 mEq/L   CO2 23  19 - 32 mEq/L   Glucose, Bld 68 (*) 70 - 99 mg/dL   BUN 7  6 - 23 mg/dL   Creatinine, Ser 1.61  0.50 - 1.10 mg/dL   Calcium 8.6  8.4 - 09.6 mg/dL   Total Protein 7.1  6.0 - 8.3 g/dL   Albumin 3.9  3.5 - 5.2 g/dL   AST 19  0 - 37 U/L   ALT 7  0 - 35 U/L   Alkaline Phosphatase 40  39 - 117 U/L   Total Bilirubin 0.2 (*) 0.3 - 1.2 mg/dL   GFR calc non Af Amer 88 (*) >90 mL/min   GFR calc Af Amer >90  >90 mL/min   Comment: (NOTE)     The eGFR has been calculated using the CKD EPI equation.     This calculation has not been validated in all clinical situations.     eGFR's persistently <90 mL/min signify possible Chronic Kidney     Disease.  CBC     Status: None   Collection Time    05/25/13  6:20 AM      Result Value Range   WBC 5.5  4.0 - 10.5 K/uL   RBC 4.08  3.87 - 5.11 MIL/uL   Hemoglobin 12.8  12.0 - 15.0 g/dL   HCT 04.5  40.9 - 81.1 %   MCV 90.9  78.0 - 100.0 fL   MCH 31.4  26.0 - 34.0 pg   MCHC 34.5  30.0 - 36.0 g/dL   RDW 91.4  78.2 - 95.6 %   Platelets 204  150 - 400 K/uL  TROPONIN I     Status: None   Collection Time    05/25/13  6:20 AM      Result Value Range   Troponin I <0.30  <0.30 ng/mL   Comment:            Due  to the release kinetics of cTnI,     a negative result within the first hours     of the onset of symptoms does not rule out     myocardial infarction with certainty.     If myocardial infarction is still suspected,     repeat the test at appropriate intervals.  D-DIMER, QUANTITATIVE     Status: None   Collection Time    05/25/13  6:20 AM      Result Value Range   D-Dimer, Quant <0.27  0.00 - 0.48 ug/mL-FEU   Comment:            AT THE INHOUSE ESTABLISHED CUTOFF     VALUE OF 0.48 ug/mL FEU,     THIS ASSAY  HAS BEEN DOCUMENTED     IN THE LITERATURE TO HAVE     A SENSITIVITY AND NEGATIVE     PREDICTIVE VALUE OF AT LEAST     98 TO 99%.  THE TEST RESULT     SHOULD BE CORRELATED WITH     AN ASSESSMENT OF THE CLINICAL     PROBABILITY OF DVT / VTE.  TROPONIN I     Status: None   Collection Time    05/25/13  9:00 AM      Result Value Range   Troponin I <0.30  <0.30 ng/mL   Comment:            Due to the release kinetics of cTnI,     a negative result within the first hours     of the onset of symptoms does not rule out     myocardial infarction with certainty.     If myocardial infarction is still suspected,     repeat the test at appropriate intervals.   Labs are reviewed and are pertinent for blood alcohol 267..  Current Facility-Administered Medications  Medication Dose Route Frequency Provider Last Rate Last Dose  . 0.9 %  sodium chloride infusion   Intravenous Continuous Therisa Doyne, MD 125 mL/hr at 05/25/13 0812 1,000 mL at 05/25/13 1610  . acetaminophen (TYLENOL) tablet 650 mg  650 mg Oral Q6H PRN Therisa Doyne, MD       Or  . acetaminophen (TYLENOL) suppository 650 mg  650 mg Rectal Q6H PRN Therisa Doyne, MD      . aspirin chewable tablet 81 mg  81 mg Oral Daily Therisa Doyne, MD   81 mg at 05/25/13 1029  . docusate sodium (COLACE) capsule 100 mg  100 mg Oral BID Therisa Doyne, MD   100 mg at 05/25/13 1029  . enoxaparin (LOVENOX) injection 40 mg  40 mg Subcutaneous Q24H Therisa Doyne, MD      . famotidine (PEPCID) tablet 20 mg  20 mg Oral Daily Therisa Doyne, MD   20 mg at 05/25/13 1028  . HYDROcodone-acetaminophen (NORCO/VICODIN) 5-325 MG per tablet 1-2 tablet  1-2 tablet Oral Q4H PRN Therisa Doyne, MD      . levothyroxine (SYNTHROID, LEVOTHROID) tablet 88 mcg  88 mcg Oral QAC breakfast Therisa Doyne, MD   88 mcg at 05/25/13 0812  . LORazepam (ATIVAN) tablet 0.5 mg  0.5 mg Oral BID PRN Therisa Doyne, MD   0.5 mg at 05/25/13 0406   . nebivolol (BYSTOLIC) tablet 2.5 mg  2.5 mg Oral Daily Therisa Doyne, MD   2.5 mg at 05/25/13 1029  . ondansetron (ZOFRAN) tablet 4 mg  4 mg Oral Q6H PRN Therisa Doyne, MD  Or  . ondansetron (ZOFRAN) injection 4 mg  4 mg Intravenous Q6H PRN Therisa Doyne, MD      . pantoprazole (PROTONIX) EC tablet 40 mg  40 mg Oral Daily Therisa Doyne, MD   40 mg at 05/25/13 1028  . sertraline (ZOLOFT) tablet 150 mg  150 mg Oral Daily Therisa Doyne, MD   150 mg at 05/25/13 1028  . sodium chloride 0.9 % injection 3 mL  3 mL Intravenous Q12H Therisa Doyne, MD   3 mL at 05/25/13 1034  . traMADol (ULTRAM) tablet 50 mg  50 mg Oral Q8H PRN Therisa Doyne, MD   50 mg at 05/25/13 0406    Psychiatric Specialty Exam:     Blood pressure 96/55, pulse 75, temperature 98.7 F (37.1 C), temperature source Oral, resp. rate 16, height 5\' 7"  (1.702 m), weight 65.772 kg (145 lb), last menstrual period 05/09/2013, SpO2 100.00%.Body mass index is 22.71 kg/(m^2).  General Appearance: Casual  Eye Contact::  Fair  Speech:  Slow  Volume:  Decreased  Mood:  Depressed  Affect:  Constricted  Thought Process:  Coherent  Orientation:  Full (Time, Place, and Person)  Thought Content:  Rumination  Suicidal Thoughts:  No  Homicidal Thoughts:  No  Memory:  Recent;   Poor  Judgement:  Poor  Insight:  Lacking but remorseful  Psychomotor Activity:  Decreased  Concentration:  Fair  Recall:  Good  Akathisia:  No  Handed:  Right  AIMS (if indicated):     Assets:  Desire for Improvement Financial Resources/Insurance Leisure Time Vocational/Educational  Sleep:      Treatment Plan Summary: Has been on zoloft for last 3 years.  Recommend discontinue zoloft. Start lexapro 10mg  QD. Currently vitals are stable. Consider Ciwa protocol if needed or ativan 0.5 tid for her anxiety and panic symptoms. Thiamine 100mg  qd.  She wants to follow outpatient psychiatry services for substance  abuse. Recommend support groups AA and substance abuse counselling once ready for discharge. Contact social services to make appointments. Appreciate the consult.    Lisa Waters 05/25/2013 11:29 AM

## 2013-05-25 NOTE — Discharge Summary (Signed)
Physician Discharge Summary  Lisa Waters ZOX:[id number] DOB: [birth year] DOA: 05/24/2013  PCP: Carollee Herter, MD  Admit date: 05/24/2013 Discharge date: 05/25/2013  Time spent: >35 minutes  Recommendations for Outpatient Follow-up:  F/u with psychiatrist in 1-2 week   Discharge Diagnoses:  Active Problems:   Chest pain on breathing   Hypokalemia   Apneic episode   Anxiety disorder   Major depression, recurrent   Discharge Condition: stable   Diet recommendation: heart healthy   Filed Weights   05/25/13 0343  Weight: 65.772 kg (145 lb)    History of present illness:  32 y/o female with PMH of hodgkin's lymphoma (2007), Hypothyroidism, Depression, Anxiety, GERD presented with ETOH intoxication rapid response was called for unresponsiveness and brought to hospital but upon arrival to ED patient was awake, anxious, confused   Hospital Course:  1. Unresponsiveness likely due to alcohol intoxication, patient also reports taking ativan; neuro exam no focal; CT head: no acute findings;  -symptoms resolved, patient is back to baseline; trop, neg, ecg no acute findings; no acute cardiopulmonary symptoms at this time; echo: pend 2. Chest pain atypical; as above; monitor  3. Anxiety, depression; started on lexapro per psychiatry; no active suicidal ideation; outpatient follow up; stop substance abuse    D/w patient; she wants to go home, and f/u with PCP this week with echo results   Procedures:  CXR (i.e. Studies not automatically included, echos, thoracentesis, etc; not x-rays)  Consultations:  psychiatry   Discharge Exam: Filed Vitals:   05/25/13 1025  BP: 96/55  Pulse:   Temp:   Resp:     General: alert Cardiovascular: s1,s2 rrr Respiratory: CTA BL   Discharge Instructions  Discharge Orders   Future Orders Complete By Expires   Diet - low sodium heart healthy  As directed    Discharge instructions  As directed    Comments:     Please follow up with  psychiatrist in 1-2 weeks   Increase activity slowly  As directed        Medication List    STOP taking these medications       sertraline 100 MG tablet  Commonly known as:  ZOLOFT      TAKE these medications       aspirin 81 MG tablet  Take 81 mg by mouth daily.     escitalopram 10 MG tablet  Commonly known as:  LEXAPRO  Take 1 tablet (10 mg total) by mouth daily.     famotidine 20 MG tablet  Commonly known as:  PEPCID  One at bedtime     hyoscyamine 0.375 MG 12 hr tablet  Commonly known as:  LEVBID  Take 1 tablet (0.375 mg total) by mouth every 12 (twelve) hours as needed for cramping.     ibuprofen 800 MG tablet  Commonly known as:  ADVIL,MOTRIN  Take 1 tablet (800 mg total) by mouth every 8 (eight) hours as needed for pain.     levothyroxine 88 MCG tablet  Commonly known as:  SYNTHROID, LEVOTHROID  Take 88 mcg by mouth daily before breakfast.     LORazepam 1 MG tablet  Commonly known as:  ATIVAN  Take 0.5 tablets (0.5 mg total) by mouth 2 (two) times daily as needed for anxiety.     nebivolol 5 MG tablet  Commonly known as:  BYSTOLIC  Take 1 tablet (5 mg total) by mouth daily.     omeprazole 40 MG capsule  Commonly known as:  PRILOSEC  Take 1 capsule (40 mg total) by mouth daily.     SUMAtriptan 100 MG tablet  Commonly known as:  IMITREX  Take 1 tablet (100 mg total) by mouth every 2 (two) hours as needed for migraine. Not to exceed more than 2 tablets per 24 hours     traMADol 50 MG tablet  Commonly known as:  ULTRAM  Take 1 tablet (50 mg total) by mouth every 8 (eight) hours as needed for pain.       Allergies  Allergen Reactions  . Codeine Itching  . Iohexol Hives  . Pindolol     Avoid non selective beta blockers due to dyspnea       Follow-up Information   Follow up with Carollee Herter, MD In 1 week.   Specialty:  Family Medicine   Contact information:   [address] [place] [address] [phone]       Follow up  with Thresa Ross, MD In 1 week.   Specialty:  Psychiatry   Contact information:   [address] [place] [address] [phone]        The results of significant diagnostics from this hospitalization (including imaging, microbiology, ancillary and laboratory) are listed below for reference.    Significant Diagnostic Studies: Ct Head Wo Contrast (only If Suspected Head Trauma And/or Pt Is On Anticoagulant)  05/25/2013   CLINICAL DATA:  Unresponsive, apneic  EXAM: CT HEAD WITHOUT CONTRAST  TECHNIQUE: Contiguous axial images were obtained from the base of the skull through the vertex without intravenous contrast.  COMPARISON:  Prior CT from 05/21/2012  FINDINGS: No acute intracranial hemorrhage or infarct. Gray-white matter differentiation is well maintained. The CSF containing spaces are normal without evidence of diffuse edema. No hydrocephalus. No extra-axial fluid collection. No mass or midline shift. Calvarium is intact. Orbits are normal. Paranasal sinuses and mastoid air cells are clear.  IMPRESSION: No acute intracranial abnormality.   Electronically Signed   By: Rise Mu M.D.   On: 05/25/2013 00:47   Dg Chest Portable 1 View  05/24/2013   CLINICAL DATA:  Altered mental status.  EXAM: PORTABLE CHEST - 1 VIEW  COMPARISON:  Chest radiograph performed 10/07/2012  FINDINGS: The lungs are well-aerated and clear. There is no evidence of focal opacification, pleural effusion or pneumothorax.  The cardiomediastinal silhouette is within normal limits. No acute osseous abnormalities are seen. External pacing pads are noted overlying the left hemithorax.  IMPRESSION: No acute cardiopulmonary process seen.   Electronically Signed   By: Roanna Raider M.D.   On: 05/24/2013 23:52    Microbiology: No results found for this or any previous visit (from the past 240 hour(s)).   Labs: Basic Metabolic Panel:  Recent Labs Lab 05/24/13 2254 05/25/13 0620  NA 133* 140  K 3.4*  4.2  CL 91* 102  CO2 21 23  GLUCOSE 98 68*  BUN 7 7  CREATININE 0.86 0.86  CALCIUM 9.9 8.6  MG  --  1.9  PHOS  --  4.3   Liver Function Tests:  Recent Labs Lab 05/24/13 2254 05/25/13 0620  AST 23 19  ALT 9 7  ALKPHOS 49 40  BILITOT 0.2* 0.2*  PROT 8.3 7.1  ALBUMIN 4.9 3.9   No results found for this basename: LIPASE, AMYLASE,  in the last 168 hours No results found for this basename: AMMONIA,  in the last 168 hours CBC:  Recent Labs Lab 05/24/13 2254 05/25/13 0620  WBC 8.0 5.5  HGB  14.6 12.8  HCT 41.1 37.1  MCV 89.2 90.9  PLT 239 204   Cardiac Enzymes:  Recent Labs Lab 05/25/13 0620 05/25/13 0900  TROPONINI <0.30 <0.30   BNP: BNP (last 3 results) No results found for this basename: PROBNP,  in the last 8760 hours CBG: No results found for this basename: GLUCAP,  in the last 168 hours     Signed:  [name]  [facility] 05/25/2013, 12:32 PM

## 2013-05-25 NOTE — Progress Notes (Signed)
Chaplain responded to Code Blue in the morgue, which moved to the ED. Pt was brought to hospital by family member, who remained with the pt. Nursing staff reported that pt was stabilized and chaplain support was not needed at that time. Available for additional emotional/spiritual support as needed or requested.   Maurene Capes [phone] -- on call chaplain pager

## 2013-05-25 NOTE — Progress Notes (Signed)
Utilization Review completed.  

## 2013-05-25 NOTE — Progress Notes (Signed)
[facility] PROGRESS NOTE  Lisa Waters YNW:[id number] DOB: 23-May-1981 DOA: 05/24/2013 PCP: Lisa Herter, MD  Assessment/Plan: 32 y/o female with PMH of hodgkin's lymphoma (2007), Hypothyroidism, Depression, Anxiety, GERD presented with ETOH intoxication rapid response was called for unresponsiveness and brought to hospital but upon arrival to ED patient was awake, anxious, confused   1. Unresponsiveness likely due to alcohol intoxication, patient also reports taking ativan; neuro exam no focal; CT head: no acute findings;  -trop, neg, ecg no acute findings; no acute cardiopulmonary symptoms at this time; will obtain echo;   2. Chest pain atypical; as above; monitor   3. Anxiety, depression; cont SSRI; no active suicidal ideation     Code Status: full Family Communication: husband at the bedside (indicate person spoken with, relationship, and if by phone, the number) Disposition Plan: home likely in AM    Consultants:  None   Procedures: CXR   Antibiotics:  None  (indicate start date, and stop date if known)  HPI/Subjective: alert  Objective: Filed Vitals:   05/25/13 0343  BP: 94/58  Pulse: 84  Temp: 98.1 F (36.7 C)  Resp: 18   No intake or output data in the 24 hours ending 05/25/13 0902 Filed Weights   05/25/13 0343  Weight: 65.772 kg (145 lb)    Exam:   General:  alert  Cardiovascular: s1,s2 rrr  Respiratory: CTA BL   Abdomen: soft, nt, nd   Musculoskeletal: no LE edema    Data Reviewed: Basic Metabolic Panel:  Recent Labs Lab 05/24/13 2254 05/25/13 0620  NA 133* 140  K 3.4* 4.2  CL 91* 102  CO2 21 23  GLUCOSE 98 68*  BUN 7 7  CREATININE 0.86 0.86  CALCIUM 9.9 8.6  MG  --  1.9  PHOS  --  4.3   Liver Function Tests:  Recent Labs Lab 05/24/13 2254 05/25/13 0620  AST 23 19  ALT 9 7  ALKPHOS 49 40  BILITOT 0.2* 0.2*  PROT 8.3 7.1  ALBUMIN 4.9 3.9   No results found for this basename: LIPASE, AMYLASE,  in the  last 168 hours No results found for this basename: AMMONIA,  in the last 168 hours CBC:  Recent Labs Lab 05/24/13 2254 05/25/13 0620  WBC 8.0 5.5  HGB 14.6 12.8  HCT 41.1 37.1  MCV 89.2 90.9  PLT 239 204   Cardiac Enzymes:  Recent Labs Lab 05/25/13 0620  TROPONINI <0.30   BNP (last 3 results) No results found for this basename: PROBNP,  in the last 8760 hours CBG: No results found for this basename: GLUCAP,  in the last 168 hours  No results found for this or any previous visit (from the past 240 hour(s)).   Studies: Ct Head Wo Contrast (only If Suspected Head Trauma And/or Pt Is On Anticoagulant)  05/25/2013   CLINICAL DATA:  Unresponsive, apneic  EXAM: CT HEAD WITHOUT CONTRAST  TECHNIQUE: Contiguous axial images were obtained from the base of the skull through the vertex without intravenous contrast.  COMPARISON:  Prior CT from 05/21/2012  FINDINGS: No acute intracranial hemorrhage or infarct. Gray-white matter differentiation is well maintained. The CSF containing spaces are normal without evidence of diffuse edema. No hydrocephalus. No extra-axial fluid collection. No mass or midline shift. Calvarium is intact. Orbits are normal. Paranasal sinuses and mastoid air cells are clear.  IMPRESSION: No acute intracranial abnormality.   Electronically Signed   By: Rise Mu M.D.   On: 05/25/2013 00:47   Dg  Chest Portable 1 View  05/24/2013   CLINICAL DATA:  Altered mental status.  EXAM: PORTABLE CHEST - 1 VIEW  COMPARISON:  Chest radiograph performed 10/07/2012  FINDINGS: The lungs are well-aerated and clear. There is no evidence of focal opacification, pleural effusion or pneumothorax.  The cardiomediastinal silhouette is within normal limits. No acute osseous abnormalities are seen. External pacing pads are noted overlying the left hemithorax.  IMPRESSION: No acute cardiopulmonary process seen.   Electronically Signed   By: Roanna Raider M.D.   On: 05/24/2013 23:52     Scheduled Meds: . aspirin  81 mg Oral Daily  . docusate sodium  100 mg Oral BID  . enoxaparin (LOVENOX) injection  40 mg Subcutaneous Q24H  . famotidine  20 mg Oral Daily  . levothyroxine  88 mcg Oral QAC breakfast  . nebivolol  2.5 mg Oral Daily  . pantoprazole  40 mg Oral Daily  . sertraline  150 mg Oral Daily  . sodium chloride  3 mL Intravenous Q12H   Continuous Infusions: . sodium chloride 1,000 mL (05/25/13 4098)    Active Problems:   Chest pain on breathing   Hypokalemia   Apneic episode   Anxiety disorder    Time spent: >35 minutes     Lisa Waters  [facility] Pager [phone]. If 7PM-7AM, please contact night-coverage at [email or web address], password [facility] 05/25/2013, 9:02 AM  LOS: 1 day

## 2013-05-25 NOTE — ED Provider Notes (Signed)
I saw and evaluated the patient, reviewed the resident's note and I agree with the findings and plan.  EKG Interpretation     Ventricular Rate:    PR Interval:    QRS Duration:   QT Interval:    QTC Calculation:   R Axis:     Text Interpretation:              PT with altered MS.  Some periods of brief apnea.  +ETOH on board.  Appears to be improving.  Will likely admit for obs.   Rolan Bucco, MD 05/25/13 [phone]

## 2013-05-25 NOTE — Significant Event (Signed)
Rapid Response Event Note  Overview:      Initial Focused Assessment:   Interventions:   Event Summary: upon arrival to [facility] (ground floor - corridor outside of morgue) saw Ms.Deleonardis (w/ s.o. At her side) screaming out and being lifted on to stretcher by multiple personal already in attendance. Ms. Salman brought to ED via stretcher. Seemed to have period of apnea when getting in to elevated and was given sternal rub by RN (also, already assisting) and being given "breaths" via ambu bag by Respiratory Therapist. Arrived to Ed trauma C and patient awake, anxious and some what confused. ED personal immediately took over care.   at      at          Mallie Darting

## 2013-05-25 NOTE — Progress Notes (Signed)
Echocardiogram 2D Echocardiogram has been performed.  Dorothey Baseman 05/25/2013, 11:13 AM

## 2013-05-25 NOTE — H&P (Signed)
PCP:  Carollee Herter, MD    Chief Complaint:  Stopped breathing   HPI: Lisa Waters is a 32 y.o. female   has a past medical history of History of hodgkin's lymphoma (2007); Hypothyroidism; Depression; Anxiety; GERD (gastroesophageal reflux disease); MVP (mitral valve prolapse); Migraine; Abnormal finding on chest xray (09/2012); Palpitations (4/14); Moderate mitral regurgitation by prior echocardiogram (09/2012); History of mammogram (12/2012); H/O amaurosis fugax (Fall 2012); Reactive airway disease with wheezing; and Asthma.   Presented with  Patient have developed worsening and anxiety over the past she's been followed by her psychotherapist. Her abdomen has been recently titrated up to 1 mg twice daily. As well as Zoloft has been increased. Husband states patient had decreased her by mouth intake as she is worried she is going to choke on food. Today they went out to a bar patient apparently likely had some alcohol prior to going to the bar and then at the bar had some alcohol as well. Shortly thereafter the patient started to develop episodes of apnea the husband attempted to wake her rapid by hitting her in the chest. Patient turned blue they brought then to [facility] but accidentally pulled her up to the back instead of emergency department where she was noted by respiratory therapist and rapid response nurse was called. Patient was brought in to emerge department from there and started to spontaneously improve. Urine tox did not show any evidence of benzodiazepines but did show blood alcohol level of 267. Patient states she has no recollection of drinking alcohol. Given presentation of severe apnea hospitalist was called for admission. At some point there was question whether portion had a pulse but there were no documented cardiac arrest. Patient endorses chest pain which is worse on deep breaths. As well as bilateral scapular pain.  Review of Systems:    Pertinent positives  include: chest pain, shortness of breath at rest. Back pain  Constitutional:  No weight loss, night sweats, Fevers, chills, fatigue, weight loss  HEENT:  No headaches, Difficulty swallowing,Tooth/dental problems,Sore throat,  No sneezing, itching, ear ache, nasal congestion, post nasal drip,  Cardio-vascular:  No Orthopnea, PND, anasarca, dizziness, palpitations.no Bilateral lower extremity swelling  GI:  No heartburn, indigestion, abdominal pain, nausea, vomiting, diarrhea, change in bowel habits, loss of appetite, melena, blood in stool, hematemesis Resp:  no No dyspnea on exertion, No excess mucus, no productive cough, No non-productive cough, No coughing up of blood.No change in color of mucus.No wheezing. Skin:  no rash or lesions. No jaundice GU:  no dysuria, change in color of urine, no urgency or frequency. No straining to urinate.  No flank pain.  Musculoskeletal:  No joint pain or no joint swelling. No decreased range of motion. No back pain.  Psych:  No change in mood or affect. No depression or anxiety. No memory loss.  Neuro: no localizing neurological complaints, no tingling, no weakness, no double vision, no gait abnormality, no slurred speech, no confusion  Otherwise ROS are negative except for above, 10 systems were reviewed  Past Medical History: Past Medical History  Diagnosis Date  . History of hodgkin's lymphoma 2007    in remission, sees [facility], Dr. Greggory Stallion; prior chemotherapy and radiation therapy; sees yearly in December  . Hypothyroidism   . Depression   . Anxiety   . GERD (gastroesophageal reflux disease)   . MVP (mitral valve prolapse)     Dr. Herbie Baltimore, [facility]; Moderate  MR; EF 60-65%  . Migraine  associated ocular symptoms, Dr. Brandon Melnick, [facility]  . Abnormal finding on chest xray 09/2012    COPD changes with right upper lobe scarring  . Palpitations 4/14    holter monitor, Dr. Herbie Baltimore; no arrhythmia   . Moderate mitral regurgitation by prior  echocardiogram 09/2012  . History of mammogram 12/2012    normal  . H/O amaurosis fugax Fall 2012    MRI of brain/brain stem: no acute abnormality, small area of encephalomalacia left superior cerebellum that could be prior trauma, infection, or lacunar infarct  . Reactive airway disease with wheezing     PFTs March 2014: mild obstructive defect, normal volumes, normal diffusion, +response to bronchodilator, FEV1 96% predicted, FEV1/FVC 69%; Pulmonology consult 4/14, thought to be related to non-selective beta blockers - she had these symptoms before beta blockers.  Pindolol was changed to Bystolic  . Asthma    Past Surgical History  Procedure Laterality Date  . Appendectomy    . Breast enhancement surgery    . Rhinoplasty  deviated septum  . Lymphnode biopsy    . Doppler echocardiography  09/30/12    LVEF 60-65%, wall motion normal, LV function normal, moderate MVP, moderate regurgitation, no shunt; Dr.   . Esophagogastroduodenoscopy  04/11/13    [facility] Dr. [address]     Medications: Prior to Admission medications   Medication Sig Start Date End Date Taking? Authorizing Provider  aspirin 81 MG tablet Take 81 mg by mouth daily.   Yes Historical Provider, MD  famotidine (PEPCID) 20 MG tablet One at bedtime 11/08/12  Yes Nyoka Cowden, MD  levothyroxine (SYNTHROID, LEVOTHROID) 88 MCG tablet Take 88 mcg by mouth daily before breakfast.   Yes Historical Provider, MD  LORazepam (ATIVAN) 1 MG tablet Take 1 tablet (1 mg total) by mouth 2 (two) times daily as needed for anxiety. 05/22/13  Yes Ronnald Nian, MD  nebivolol (BYSTOLIC) 5 MG tablet Take 1 tablet (5 mg total) by mouth daily. 12/31/12  Yes Marykay Lex, MD  omeprazole (PRILOSEC) 40 MG capsule Take 1 capsule (40 mg total) by mouth daily. 11/20/12  Yes Kermit Balo Tysinger, PA-C  sertraline (ZOLOFT) 100 MG tablet Take 150 mg by mouth daily. 1-1/2 pills per day 05/22/13  Yes Ronnald Nian, MD  hyoscyamine (LEVBID) 0.375 MG 12 hr  tablet Take 1 tablet (0.375 mg total) by mouth every 12 (twelve) hours as needed for cramping. 04/07/13   Ronnald Nian, MD  ibuprofen (ADVIL,MOTRIN) 800 MG tablet Take 1 tablet (800 mg total) by mouth every 8 (eight) hours as needed for pain. 05/15/13   Ronnald Nian, MD  SUMAtriptan (IMITREX) 100 MG tablet Take 1 tablet (100 mg total) by mouth every 2 (two) hours as needed for migraine. Not to exceed more than 2 tablets per 24 hours 02/20/13 09/03/14  Ronnald Nian, MD  traMADol (ULTRAM) 50 MG tablet Take 1 tablet (50 mg total) by mouth every 8 (eight) hours as needed for pain. 05/15/13   Ronnald Nian, MD    Allergies:   Allergies  Allergen Reactions  . Codeine Itching  . Iohexol Hives  . Pindolol     Avoid non selective beta blockers due to dyspnea    Social History:  Ambulatory   independently   Lives at  home   reports that she quit smoking about 3 years ago. Her smoking use included Cigarettes. She has a 10 pack-year smoking history. She has never used smokeless tobacco. She reports  that she does not drink alcohol or use illicit drugs.   Family History: family history includes Cancer in her paternal grandmother; Diabetes in her maternal aunt; Emphysema in her paternal grandfather; Skin cancer in her father; Stroke in her maternal grandmother. There is no history of Heart disease, Hypertension, or Hyperlipidemia.    Physical Exam: Patient Vitals for the past 24 hrs:  BP Temp Temp src Pulse Resp SpO2  05/25/13 0114 97/57 mmHg - - - - -  05/25/13 0100 - - - 87 19 100 %  05/24/13 2345 108/81 mmHg - - 107 20 100 %  05/24/13 2315 97/68 mmHg - - 92 18 100 %  05/24/13 2303 103/84 mmHg - - 89 13 100 %  05/24/13 2258 103/84 mmHg 99 F (37.2 C) Oral 89 16 100 %    1. General:  in No Acute distress 2. Psychological: Alert and Oriented 3. Head/ENT:   Dry Mucous Membranes                          Head Non traumatic, neck supple                          Normal  Dentition 4. SKIN:  decreased Skin turgor,  Skin clean Dry and intact no rash 5. Heart: Regular rate and rhythm no Murmur, Rub or gallop 6. Lungs: Clear to auscultation bilaterally, no wheezes or crackles   7. Abdomen: Soft, non-tender, Non distended 8. Lower extremities: no clubbing, cyanosis, or edema 9. Neurologically Grossly intact, moving all 4 extremities equally 10. MSK: Normal range of motion  body mass index is unknown because there is no weight on file.   Labs on Admission:   Recent Labs  05/24/13 2254  NA 133*  K 3.4*  CL 91*  CO2 21  GLUCOSE 98  BUN 7  CREATININE 0.86  CALCIUM 9.9    Recent Labs  05/24/13 2254  AST 23  ALT 9  ALKPHOS 49  BILITOT 0.2*  PROT 8.3  ALBUMIN 4.9   No results found for this basename: LIPASE, AMYLASE,  in the last 72 hours  Recent Labs  05/24/13 2254  WBC 8.0  HGB 14.6  HCT 41.1  MCV 89.2  PLT 239   No results found for this basename: CKTOTAL, CKMB, CKMBINDEX, TROPONINI,  in the last 72 hours No results found for this basename: TSH, T4TOTAL, FREET3, T3FREE, THYROIDAB,  in the last 72 hours No results found for this basename: VITAMINB12, FOLATE, FERRITIN, TIBC, IRON, RETICCTPCT,  in the last 72 hours No results found for this basename: HGBA1C    The CrCl is unknown because both a height and weight (above a minimum accepted value) are required for this calculation. ABG    Component Value Date/Time   HCO3 24.6* 10/05/2007 0035   TCO2 26 10/05/2007 0035   ACIDBASEDEF 1.0 10/05/2007 0035     Lab Results  Component Value Date   DDIMER  Value: 0.24        AT THE INHOUSE ESTABLISHED CUTOFF VALUE OF 0.48 ug/mL FEU, THIS ASSAY HAS BEEN DOCUMENTED IN THE LITERATURE TO HAVE 10/05/2007     Other results:  I have pearsonaly reviewed this: ECG REPORT  Rate: 89  Rhythm: NSR ST&T Change: no ischemic changes  UA no evidence of UTI   Cultures:    Component Value Date/Time   SDES URINE, CLEAN CATCH 05/27/2008 1950   SPECREQUEST  NONE  05/27/2008 1950   CULT Multiple bacterial morphotypes present, none predominant. Suggest appropriate recollection if clinically indicated. 05/27/2008 1950   REPTSTATUS 05/29/2008 FINAL 05/27/2008 1950       Radiological Exams on Admission: Ct Head Wo Contrast (only If Suspected Head Trauma And/or Pt Is On Anticoagulant)  05/25/2013   CLINICAL DATA:  Unresponsive, apneic  EXAM: CT HEAD WITHOUT CONTRAST  TECHNIQUE: Contiguous axial images were obtained from the base of the skull through the vertex without intravenous contrast.  COMPARISON:  Prior CT from 05/21/2012  FINDINGS: No acute intracranial hemorrhage or infarct. Gray-white matter differentiation is well maintained. The CSF containing spaces are normal without evidence of diffuse edema. No hydrocephalus. No extra-axial fluid collection. No mass or midline shift. Calvarium is intact. Orbits are normal. Paranasal sinuses and mastoid air cells are clear.  IMPRESSION: No acute intracranial abnormality.   Electronically Signed   By: Rise Mu M.D.   On: 05/25/2013 00:47   Dg Chest Portable 1 View  05/24/2013   CLINICAL DATA:  Altered mental status.  EXAM: PORTABLE CHEST - 1 VIEW  COMPARISON:  Chest radiograph performed 10/07/2012  FINDINGS: The lungs are well-aerated and clear. There is no evidence of focal opacification, pleural effusion or pneumothorax.  The cardiomediastinal silhouette is within normal limits. No acute osseous abnormalities are seen. External pacing pads are noted overlying the left hemithorax.  IMPRESSION: No acute cardiopulmonary process seen.   Electronically Signed   By: Roanna Raider M.D.   On: 05/24/2013 23:52    Chart has been reviewed  Assessment/Plan This is a 32 year old female here with apneic episodes with high levels of alcohol in the blood stream as well as with prescriptions for benzodiazepines other U. tox was negative for benzos  Present on Admission:  . Apneic episode - most likely secondary to  alcohol poisoning versus some combination of benzodiazepine and alcohol. Will admit and monitor on telemetry. Patient would likely benefit from psychiatry consult given severe degree of anxiety interfering with her ability to cope . Chest pain on breathing - given history of non-Hodgkin's lymphoma, soft blood pressures will obtain d-dimer positive would get VQ scan as patient is allergic to CT contrast  . Hypokalemia- will replace  anxiety -severe interfering with functioning, per family patient has decreased PO intake, possibly self medicating with alcohol have called psychiatry consult to evaluate  Prophylaxis: Lovenox, Protonix  CODE STATUS: Full code  Other plan as per orders.  I have spent a total of 60 min on this admission called   Indyah Saulnier 05/25/2013, 1:45 AM

## 2013-05-26 ENCOUNTER — Encounter (HOSPITAL_COMMUNITY): Payer: Self-pay | Admitting: Emergency Medicine

## 2013-05-26 ENCOUNTER — Emergency Department (HOSPITAL_COMMUNITY)
Admission: EM | Admit: 2013-05-26 | Discharge: 2013-05-27 | Disposition: A | Discharge status: Home / Self Care | Payer: Managed Care, Other (non HMO) | Attending: Emergency Medicine | Admitting: Emergency Medicine

## 2013-05-26 ENCOUNTER — Encounter: Payer: Self-pay | Admitting: Family Medicine

## 2013-05-26 ENCOUNTER — Ambulatory Visit (INDEPENDENT_AMBULATORY_CARE_PROVIDER_SITE_OTHER): Payer: Managed Care, Other (non HMO) | Admitting: Family Medicine

## 2013-05-26 VITALS — BP 110/70 | HR 110

## 2013-05-26 DIAGNOSIS — F329 Major depressive disorder, single episode, unspecified: Secondary | ICD-10-CM

## 2013-05-26 DIAGNOSIS — F430 Acute stress reaction: Secondary | ICD-10-CM

## 2013-05-26 DIAGNOSIS — F39 Unspecified mood [affective] disorder: Secondary | ICD-10-CM | POA: Insufficient documentation

## 2013-05-26 DIAGNOSIS — E039 Hypothyroidism, unspecified: Secondary | ICD-10-CM | POA: Insufficient documentation

## 2013-05-26 DIAGNOSIS — Z8669 Personal history of other diseases of the nervous system and sense organs: Secondary | ICD-10-CM | POA: Insufficient documentation

## 2013-05-26 DIAGNOSIS — Z7982 Long term (current) use of aspirin: Secondary | ICD-10-CM | POA: Insufficient documentation

## 2013-05-26 DIAGNOSIS — Z8571 Personal history of Hodgkin lymphoma: Secondary | ICD-10-CM | POA: Insufficient documentation

## 2013-05-26 DIAGNOSIS — Z79899 Other long term (current) drug therapy: Secondary | ICD-10-CM | POA: Insufficient documentation

## 2013-05-26 DIAGNOSIS — Z3202 Encounter for pregnancy test, result negative: Secondary | ICD-10-CM | POA: Insufficient documentation

## 2013-05-26 DIAGNOSIS — F32A Depression, unspecified: Secondary | ICD-10-CM

## 2013-05-26 DIAGNOSIS — F3289 Other specified depressive episodes: Secondary | ICD-10-CM | POA: Insufficient documentation

## 2013-05-26 DIAGNOSIS — K219 Gastro-esophageal reflux disease without esophagitis: Secondary | ICD-10-CM | POA: Insufficient documentation

## 2013-05-26 DIAGNOSIS — J45909 Unspecified asthma, uncomplicated: Secondary | ICD-10-CM | POA: Insufficient documentation

## 2013-05-26 DIAGNOSIS — G43909 Migraine, unspecified, not intractable, without status migrainosus: Secondary | ICD-10-CM | POA: Insufficient documentation

## 2013-05-26 DIAGNOSIS — F419 Anxiety disorder, unspecified: Secondary | ICD-10-CM

## 2013-05-26 DIAGNOSIS — Z87891 Personal history of nicotine dependence: Secondary | ICD-10-CM | POA: Insufficient documentation

## 2013-05-26 DIAGNOSIS — F410 Panic disorder [episodic paroxysmal anxiety] without agoraphobia: Secondary | ICD-10-CM | POA: Insufficient documentation

## 2013-05-26 DIAGNOSIS — F1010 Alcohol abuse, uncomplicated: Secondary | ICD-10-CM

## 2013-05-26 LAB — CBC
HCT: 36.4 % (ref 36.0–46.0)
Hemoglobin: 12.4 g/dL (ref 12.0–15.0)
MCHC: 34.1 g/dL (ref 30.0–36.0)
MCV: 91 fL (ref 78.0–100.0)
Platelets: 223 10*3/uL (ref 150–400)
RBC: 4 MIL/uL (ref 3.87–5.11)
WBC: 4.8 10*3/uL (ref 4.0–10.5)

## 2013-05-26 LAB — POCT PREGNANCY, URINE: Preg Test, Ur: NEGATIVE

## 2013-05-26 LAB — COMPREHENSIVE METABOLIC PANEL WITH GFR
ALT: 9 U/L (ref 0–35)
AST: 20 U/L (ref 0–37)
Albumin: 4.1 g/dL (ref 3.5–5.2)
Alkaline Phosphatase: 47 U/L (ref 39–117)
BUN: 11 mg/dL (ref 6–23)
CO2: 22 meq/L (ref 19–32)
Calcium: 9.2 mg/dL (ref 8.4–10.5)
Chloride: 101 meq/L (ref 96–112)
Creatinine, Ser: 0.82 mg/dL (ref 0.50–1.10)
GFR calc Af Amer: >90 mL/min (ref >90)
GFR calc non Af Amer: >90 mL/min (ref >90)
Glucose, Bld: 84 mg/dL (ref 70–99)
Potassium: 3.9 meq/L (ref 3.5–5.1)
Sodium: 135 meq/L (ref 135–145)
Total Bilirubin: 0.5 mg/dL (ref 0.3–1.2)
Total Protein: 7.1 g/dL (ref 6.0–8.3)

## 2013-05-26 LAB — RAPID URINE DRUG SCREEN, HOSP PERFORMED
Amphetamines: NOT DETECTED
Barbiturates: NOT DETECTED
Benzodiazepines: NOT DETECTED
Cocaine: NOT DETECTED
Opiates: NOT DETECTED
Tetrahydrocannabinol: NOT DETECTED

## 2013-05-26 LAB — SALICYLATE LEVEL: Salicylate Lvl: <2 mg/dL — ABNORMAL LOW (ref 2.8–20.0)

## 2013-05-26 LAB — ETHANOL: Alcohol, Ethyl (B): <11 mg/dL (ref 0–11)

## 2013-05-26 LAB — GLUCOSE, CAPILLARY: Glucose-Capillary: 80 mg/dL (ref 70–99)

## 2013-05-26 MED ORDER — ALUM & MAG HYDROXIDE-SIMETH 200-200-20 MG/5ML PO SUSP
30.0000 mL | ORAL | Status: DC | PRN
  Administered 2013-05-27: 30 mL via ORAL
  Filled 2013-05-26: qty 30

## 2013-05-26 MED ORDER — MAGIC MOUTHWASH
2.0000 mL | Freq: Three times a day (TID) | ORAL | Status: DC | PRN
  Administered 2013-05-26: 2 mL via ORAL
  Filled 2013-05-26: qty 5

## 2013-05-26 MED ORDER — LORAZEPAM 1 MG PO TABS
1.0000 mg | ORAL_TABLET | Freq: Three times a day (TID) | ORAL | Status: DC | PRN
  Administered 2013-05-26 – 2013-05-27 (×3): 1 mg via ORAL
  Filled 2013-05-26 (×3): qty 1

## 2013-05-26 MED ORDER — NICOTINE 21 MG/24HR TD PT24: 21.0000 mg | MEDICATED_PATCH | Freq: Every day | TRANSDERMAL | Status: DC

## 2013-05-26 MED ORDER — IBUPROFEN 200 MG PO TABS
600.0000 mg | ORAL_TABLET | Freq: Three times a day (TID) | ORAL | Status: DC | PRN
  Administered 2013-05-26 – 2013-05-27 (×2): 600 mg via ORAL
  Filled 2013-05-26 (×2): qty 3

## 2013-05-26 MED ORDER — ZOLPIDEM TARTRATE 5 MG PO TABS
5.0000 mg | ORAL_TABLET | Freq: Every evening | ORAL | Status: DC | PRN
  Administered 2013-05-26: 5 mg via ORAL
  Filled 2013-05-26: qty 1

## 2013-05-26 MED ORDER — ONDANSETRON HCL 4 MG PO TABS: 4.0000 mg | ORAL_TABLET | Freq: Three times a day (TID) | ORAL | Status: DC | PRN

## 2013-05-26 NOTE — BH Assessment (Signed)
Tele Assessment Note   Lisa Waters is an 32 y.o. female who presents to [facility] for treatment of anxiety and panic disorder.  Lisa Waters was medically admitted on Saturday evening after drinking too much.  She was found unresponsive.  She also reports she takes benzodiazepines for anxiety and has difficulty keeping track of when she has taken them.  She was discharged from teh hospital after being evaluated by the psychiatrist and declining treatment, but today realized she could not function in her current state and returned seeking treatment.  She is not under the care of a psychiatrist, but reports her PCP has prescribed meds and nothing makes her feel better.  She states that she is constantly feeling like her chest is heavy.  She is in remission for cancer and has a heart valve problem that will need to be replaced and she always worries about when her next health crisis will occur.  Upon assessment, she has labored breathing, is tearful and has pressured soft speech.  She says this is her baseline and it rarely gets better, but often gets worse. She reports full panic attacks 3-4 times per day and states she is unable to function in her current state.  She had to take a leave of absence from her job and feels tremendous pressure to live in teh moment because she feels like she is going to die.  She worries about the toll it's taking on her marriage.  She also reports increased alcohol use over the last 3 mos-stating she drinks at least 3-4 drinks-beer and liquor per day just to feel like she can breathe due to her anxiety and she may have had more than that on Saturday, but she is unsure.  Her Primary Care Physisican called the ED and stated this is not her normal state and he feels she needs to be admitted for stabilization.  She has several firearms in the home and while she denies suicidal thoughts, now or in the last six months, does not appear stable for discharge without at least having an appropriate  plan in place.  She also denies HI and psyhcosis.  This Clinical research associate consulted with Lisa Waters, [facility] extender who reports the patient does not have criteria, but did agree that she needs to be seen by the psychiatrist and needed an appropriate plan if she were to be discharged.  The patient will be further evaluated in the morning and either admitted or discharged with an appointment for Psych IOP.  Spoke with RN in [place] and [place], [place], who are in agreement with the disposition.  Axis I: Depressive Disorder secondary to general medical condition and Panic Disorder Axis II: Deferred Axis III:  Past Medical History  Diagnosis Date  . History of hodgkin's lymphoma 2007    in remission, sees [facility], Dr. Greggory Waters; prior chemotherapy and radiation therapy; sees yearly in December  . Hypothyroidism   . Depression   . Anxiety   . GERD (gastroesophageal reflux disease)   . MVP (mitral valve prolapse)     Lisa Waters, [facility]; Moderate  MR; EF 60-65%  . Migraine     associated ocular symptoms, Dr. Brandon Waters, [facility]  . Abnormal finding on chest xray 09/2012    COPD changes with right upper lobe scarring  . Palpitations 4/14    holter monitor, Lisa Waters; no arrhythmia   . Moderate mitral regurgitation by prior echocardiogram 09/2012  . History of mammogram 12/2012    normal  . H/O amaurosis  fugax Fall 2012    MRI of brain/brain stem: no acute abnormality, small area of encephalomalacia left superior cerebellum that could be prior trauma, infection, or lacunar infarct  . Reactive airway disease with wheezing     PFTs March 2014: mild obstructive defect, normal volumes, normal diffusion, +response to bronchodilator, FEV1 96% predicted, FEV1/FVC 69%; Pulmonology consult 4/14, thought to be related to non-selective beta blockers - she had these symptoms before beta blockers.  Pindolol was changed to Bystolic  . Asthma    Axis IV: problems with access to health care services Axis V: 41-50 serious  symptoms  Past Medical History:  Past Medical History  Diagnosis Date  . History of hodgkin's lymphoma 2007    in remission, sees [facility], Dr. Greggory Waters; prior chemotherapy and radiation therapy; sees yearly in December  . Hypothyroidism   . Depression   . Anxiety   . GERD (gastroesophageal reflux disease)   . MVP (mitral valve prolapse)     Lisa Waters, [facility]; Moderate  MR; EF 60-65%  . Migraine     associated ocular symptoms, Dr. Brandon Waters, [facility]  . Abnormal finding on chest xray 09/2012    COPD changes with right upper lobe scarring  . Palpitations 4/14    holter monitor, Lisa Waters; no arrhythmia   . Moderate mitral regurgitation by prior echocardiogram 09/2012  . History of mammogram 12/2012    normal  . H/O amaurosis fugax Fall 2012    MRI of brain/brain stem: no acute abnormality, small area of encephalomalacia left superior cerebellum that could be prior trauma, infection, or lacunar infarct  . Reactive airway disease with wheezing     PFTs March 2014: mild obstructive defect, normal volumes, normal diffusion, +response to bronchodilator, FEV1 96% predicted, FEV1/FVC 69%; Pulmonology consult 4/14, thought to be related to non-selective beta blockers - she had these symptoms before beta blockers.  Pindolol was changed to Bystolic  . Asthma     Past Surgical History  Procedure Laterality Date  . Appendectomy    . Breast enhancement surgery    . Rhinoplasty  deviated septum  . Lymphnode biopsy    . Doppler echocardiography  09/30/12    LVEF 60-65%, wall motion normal, LV function normal, moderate MVP, moderate regurgitation, no shunt; Dr.   . Esophagogastroduodenoscopy  04/11/13    [facility] Dr. Loreta Waters    Family History:  Family History  Problem Relation Age of Onset  . Skin cancer Father   . Emphysema Paternal Grandfather     was a smoker  . Diabetes Maternal Aunt   . Stroke Maternal Grandmother   . Cancer Paternal Grandmother     metastasis  .  Heart disease Neg Hx   . Hypertension Neg Hx   . Hyperlipidemia Neg Hx     Social History:  reports that she quit smoking about 3 years ago. Her smoking use included Cigarettes. She has a 10 pack-year smoking history. She has never used smokeless tobacco. She reports that she drinks alcohol. She reports that she does not use illicit drugs.  Additional Social History:  Alcohol / Drug Use History of alcohol / drug use?: Yes Substance #1 Name of Substance 1: Alcohol-Beer, Vodka, etc 1 - Amount (size/oz): 3-4 drinks 1 - Frequency: daily 1 - Duration: 3 mos 1 - Last Use / Amount: Saturday 11/8  CIWA: CIWA-Ar BP: 106/54 mmHg Pulse Rate: 72 COWS:    Allergies:  Allergies  Allergen Reactions  . Codeine Itching  .  Iohexol Hives  . Pindolol     Avoid non selective beta blockers due to dyspnea    Home Medications:  (Not in a hospital admission)  OB/GYN Status:  Patient's last menstrual period was 05/09/2013.  General Assessment Data Location of Assessment: [facility] ED Is this a Tele or Face-to-Face Assessment?: Face-to-Face Is this an Initial Assessment or a Re-assessment for this encounter?: Initial Assessment Living Arrangements: Spouse/significant other Can pt return to current living arrangement?: Yes Admission Status: Voluntary Is patient capable of signing voluntary admission?: Yes Transfer from: Acute Hospital Referral Source: Self/Family/Friend     [facility] Plan Living Arrangements: Spouse/significant other Name of Therapist: Darrell   Education Status Is patient currently in school?: No Highest grade of school patient has completed: high School  Risk to self Suicidal Ideation: No Suicidal Intent: No Is patient at risk for suicide?: No Suicidal Plan?: No Access to Means: Yes Specify Access to Suicidal Means: multiple firearms in teh home What has been your use of drugs/alcohol within the last 12 months?: drinking to self medicate Previous  Attempts/Gestures: No How many times?: 0 Intentional Self Injurious Behavior: None Family Suicide History: No Recent stressful life event(s): Recent negative physical changes (many health concerns) Persecutory voices/beliefs?: No Depression: Yes Depression Symptoms: Despondent;Tearfulness;Isolating;Fatigue;Guilt;Loss of interest in usual pleasures;Feeling worthless/self pity Substance abuse history and/or treatment for substance abuse?: No Suicide prevention information given to non-admitted patients: Not applicable  Risk to Others Homicidal Ideation: No Thoughts of Harm to Others: No Current Homicidal Intent: No Current Homicidal Plan: No Access to Homicidal Means: No History of harm to others?: No Assessment of Violence: None Noted Does patient have access to weapons?: No Criminal Charges Pending?: No Does patient have a court date: No  Psychosis Hallucinations: None noted Delusions: None noted  Mental Status Report Appear/Hygiene: Disheveled Eye Contact: Poor Motor Activity: Tremors Speech: Logical/coherent;Soft;Pressured Level of Consciousness: Crying Mood: Depressed Affect: Depressed;Anxious Anxiety Level: Panic Attacks Panic attack frequency: 3-4 daily Most recent panic attack: current Thought Processes: Relevant;Coherent Judgement: Unimpaired Orientation: Person;Place;Time;Situation Obsessive Compulsive Thoughts/Behaviors: Severe  Cognitive Functioning Concentration: Decreased Memory: Recent Impaired;Remote Impaired IQ: Average Insight: Fair Impulse Control: Fair Appetite: Poor Weight Loss:  (hasn't eaten since saturday) Weight Gain: 0 Sleep: Decreased Total Hours of Sleep: 5 Vegetative Symptoms: Staying in bed  ADLScreening [facility] Assessment Services) Patient's cognitive ability adequate to safely complete daily activities?: Yes Patient able to express need for assistance with ADLs?: Yes Independently performs ADLs?: Yes (appropriate for developmental  age)  Prior Inpatient Therapy Prior Inpatient Therapy: No Prior Therapy Dates: '''  Prior Outpatient Therapy Prior Outpatient Therapy: Yes Prior Therapy Dates: just started Monday  ADL Screening (condition at time of admission) Patient's cognitive ability adequate to safely complete daily activities?: Yes Patient able to express need for assistance with ADLs?: Yes Independently performs ADLs?: Yes (appropriate for developmental age)  Home Assistive Devices/Equipment Home Assistive Devices/Equipment: None      Values / Beliefs Cultural Requests During Hospitalization: None Spiritual Requests During Hospitalization: None   Advance Directives (For Healthcare) Advance Directive: Patient does not have advance directive;Patient would not like information Pre-existing out of facility DNR order (yellow form or pink MOST form): No Nutrition Screen- [facility] Patient's home diet: Regular  Additional Information 1:1 In Past 12 Months?: No CIRT Risk: No Elopement Risk: No Does patient have medical clearance?: Yes     Disposition:  Disposition Initial Assessment Completed for this Encounter: Yes Disposition of Patient: Other dispositions Other disposition(s): Other (Comment)  Steward Ros 05/26/2013 10:23 PM

## 2013-05-26 NOTE — ED Notes (Signed)
Telepsych complete

## 2013-05-26 NOTE — ED Notes (Signed)
Pt tearful and anxious 

## 2013-05-26 NOTE — ED Notes (Signed)
Pt states that she has had severe depression and panic attacks for about 2-3 weeks. Pt has over-powering sense of death that has been going on for months.  Pt states that she was using alcohol 3-4 times everyday until Saturday when she ended up in the hospital due to alcohol intoxication and stopped breathing.

## 2013-05-26 NOTE — ED Provider Notes (Signed)
CSN: [id number]     Arrival date & time 05/26/13  1620 History   First MD Initiated Contact with Patient 05/26/13 1644     Chief Complaint  Patient presents with  . Medical Clearance  . Depression   (Consider location/radiation/quality/duration/timing/severity/associated sxs/prior Treatment) HPI Comments: Patient is a 32 year old female with a past medical history of anxiety, depression, GERD, MVP, migraines, palpitations and asthma who presents to the emergency department complaining of worsening anxiety, depression and panic attacks since being discharged from the hospital yesterday. States she is ashamed of herself for the way she handled her anxiety prior to being in the hospital, she was drinking 2-3 times daily. No other alcohol use since being discharged. Denies drug use. States she has "an overpowering subsequent death" for the past few months causing her severe. She lives at home with her husband and feels safe. Denies suicidal or homicidal ideations. She feels as if her medications are not working, was switched to Lexapro when she was in the hospital yesterday.  The history is provided by the patient.    Past Medical History  Diagnosis Date  . History of hodgkin's lymphoma 2007    in remission, sees [facility], Dr. Greggory Stallion; prior chemotherapy and radiation therapy; sees yearly in December  . Hypothyroidism   . Depression   . Anxiety   . GERD (gastroesophageal reflux disease)   . MVP (mitral valve prolapse)     Dr. Herbie Baltimore, [facility]; Moderate  MR; EF 60-65%  . Migraine     associated ocular symptoms, Dr. Brandon Melnick, [facility]  . Abnormal finding on chest xray 09/2012    COPD changes with right upper lobe scarring  . Palpitations 4/14    holter monitor, Dr. Herbie Baltimore; no arrhythmia   . Moderate mitral regurgitation by prior echocardiogram 09/2012  . History of mammogram 12/2012    normal  . H/O amaurosis fugax Fall 2012    MRI of brain/brain stem: no acute abnormality, small area of  encephalomalacia left superior cerebellum that could be prior trauma, infection, or lacunar infarct  . Reactive airway disease with wheezing     PFTs March 2014: mild obstructive defect, normal volumes, normal diffusion, +response to bronchodilator, FEV1 96% predicted, FEV1/FVC 69%; Pulmonology consult 4/14, thought to be related to non-selective beta blockers - she had these symptoms before beta blockers.  Pindolol was changed to Bystolic  . Asthma    Past Surgical History  Procedure Laterality Date  . Appendectomy    . Breast enhancement surgery    . Rhinoplasty  deviated septum  . Lymphnode biopsy    . Doppler echocardiography  09/30/12    LVEF 60-65%, wall motion normal, LV function normal, moderate MVP, moderate regurgitation, no shunt; Dr.   . Esophagogastroduodenoscopy  04/11/13    [facility] Dr. Loreta Ave   Family History  Problem Relation Age of Onset  . Skin cancer Father   . Emphysema Paternal Grandfather     was a smoker  . Diabetes Maternal Aunt   . Stroke Maternal Grandmother   . Cancer Paternal Grandmother     metastasis  . Heart disease Neg Hx   . Hypertension Neg Hx   . Hyperlipidemia Neg Hx    History  Substance Use Topics  . Smoking status: Former Smoker -- 1.00 packs/day for 10 years    Types: Cigarettes    Quit date: 11/07/2009  . Smokeless tobacco: Never Used  . Alcohol Use: Yes     Comment: 3-4drinks/day   OB  History   Grav Para Term Preterm Abortions TAB SAB Ect Mult Living   0 0 0 0 0 0 0 0 0 0      Review of Systems  Psychiatric/Behavioral: Positive for dysphoric mood. The patient is nervous/anxious.   All other systems reviewed and are negative.    Allergies  Codeine; Iohexol; and Pindolol  Home Medications   Current Outpatient Rx  Name  Route  Sig  Dispense  Refill  . aspirin 81 MG tablet   Oral   Take 81 mg by mouth daily.         Marland Kitchen escitalopram (LEXAPRO) 10 MG tablet   Oral   Take 1 tablet (10 mg total) by mouth  daily.   30 tablet   0   . famotidine (PEPCID) 20 MG tablet      One at bedtime   30 tablet   11   . hyoscyamine (LEVBID) 0.375 MG 12 hr tablet   Oral   Take 1 tablet (0.375 mg total) by mouth every 12 (twelve) hours as needed for cramping.   20 tablet   0   . ibuprofen (ADVIL,MOTRIN) 800 MG tablet   Oral   Take 1 tablet (800 mg total) by mouth every 8 (eight) hours as needed for pain.   30 tablet   0   . levothyroxine (SYNTHROID, LEVOTHROID) 88 MCG tablet   Oral   Take 88 mcg by mouth daily before breakfast.         . LORazepam (ATIVAN) 1 MG tablet   Oral   Take 0.5 tablets (0.5 mg total) by mouth 2 (two) times daily as needed for anxiety.   20 tablet   1   . nebivolol (BYSTOLIC) 5 MG tablet   Oral   Take 1 tablet (5 mg total) by mouth daily.   90 tablet   3   . omeprazole (PRILOSEC) 40 MG capsule   Oral   Take 1 capsule (40 mg total) by mouth daily.   90 capsule   0   . SUMAtriptan (IMITREX) 100 MG tablet   Oral   Take 1 tablet (100 mg total) by mouth every 2 (two) hours as needed for migraine. Not to exceed more than 2 tablets per 24 hours   10 tablet   1   . traMADol (ULTRAM) 50 MG tablet   Oral   Take 1 tablet (50 mg total) by mouth every 8 (eight) hours as needed for pain.   30 tablet   0    BP 142/77  Pulse 91  Temp(Src) 98.5 F (36.9 C) (Oral)  Resp 18  SpO2 99%  LMP 05/09/2013 Physical Exam  Nursing note and vitals reviewed. Constitutional: She is oriented to person, place, and time. She appears well-developed and well-nourished. No distress.  HENT:  Head: Normocephalic and atraumatic.  Mouth/Throat: Oropharynx is clear and moist.  Eyes: Conjunctivae and EOM are normal. Pupils are equal, round, and reactive to light.  Neck: Normal range of motion. Neck supple.  Cardiovascular: Regular rhythm and normal heart sounds.  Tachycardia present.   Pulmonary/Chest: Effort normal and breath sounds normal.  Abdominal: Soft. Bowel sounds are  normal. There is no tenderness.  Musculoskeletal: Normal range of motion. She exhibits no edema.  Neurological: She is alert and oriented to person, place, and time.  Skin: Skin is warm and dry. She is not diaphoretic.  Psychiatric: Her behavior is normal. Her mood appears anxious. She exhibits a depressed mood.  Tearful.  ED Course  Procedures (including critical care time) Labs Review Labs Reviewed  ACETAMINOPHEN LEVEL  CBC  COMPREHENSIVE METABOLIC PANEL  ETHANOL  SALICYLATE LEVEL  URINE RAPID DRUG SCREEN (HOSP PERFORMED)  POCT PREGNANCY, URINE   Imaging Review Ct Head Wo Contrast (only If Suspected Head Trauma And/or Pt Is On Anticoagulant)  05/25/2013   CLINICAL DATA:  Unresponsive, apneic  EXAM: CT HEAD WITHOUT CONTRAST  TECHNIQUE: Contiguous axial images were obtained from the base of the skull through the vertex without intravenous contrast.  COMPARISON:  Prior CT from 05/21/2012  FINDINGS: No acute intracranial hemorrhage or infarct. Gray-white matter differentiation is well maintained. The CSF containing spaces are normal without evidence of diffuse edema. No hydrocephalus. No extra-axial fluid collection. No mass or midline shift. Calvarium is intact. Orbits are normal. Paranasal sinuses and mastoid air cells are clear.  IMPRESSION: No acute intracranial abnormality.   Electronically Signed   By: Rise Mu M.D.   On: 05/25/2013 00:47   Dg Chest Portable 1 View  05/24/2013   CLINICAL DATA:  Altered mental status.  EXAM: PORTABLE CHEST - 1 VIEW  COMPARISON:  Chest radiograph performed 10/07/2012  FINDINGS: The lungs are well-aerated and clear. There is no evidence of focal opacification, pleural effusion or pneumothorax.  The cardiomediastinal silhouette is within normal limits. No acute osseous abnormalities are seen. External pacing pads are noted overlying the left hemithorax.  IMPRESSION: No acute cardiopulmonary process seen.   Electronically Signed   By: Roanna Raider M.D.   On: 05/24/2013 23:52    EKG Interpretation   None       MDM   1. Anxiety   2. Panic attacks   3. Depression     Patient with worsening anxiety, panic attacks, depression. Recently discharged, symptoms worsened. No SI/HI. Labs pending. Psych hold. TTS consult. 8:10 PM I spoke with Marchelle Folks at [facility], patient will have eval by psychiatrist in morning. Medically cleared.  Trevor Mace, PA-C 05/26/13 2326

## 2013-05-26 NOTE — Progress Notes (Signed)
  Subjective:    Patient ID: Lisa Waters, female    DOB: 05-Apr-1981, 32 y.o.   MRN: [id number]  HPI She is here for a recheck. She was seen earlier today by Darryl Hyers. When she was in the hospital apparently she was offered admission to behavioral health but she turns down. She now is interested. She feels as if she cannot handle her present situation.  Review of Systems     Objective:   Physical Exam Alert and slightly disheveled and quite tearful.       Assessment & Plan:  Major depression  Stress reaction  Alcohol abuse  discussed possible admission. Checked with behavioral health. We will need to send her back to [facility] long for medical clearance and then hopefully they will admit for further care.

## 2013-05-27 DIAGNOSIS — F411 Generalized anxiety disorder: Secondary | ICD-10-CM

## 2013-05-27 MED ORDER — ESCITALOPRAM OXALATE 20 MG PO TABS: 20.0000 mg | ORAL_TABLET | Freq: Every day | ORAL | Status: DC

## 2013-05-27 MED ORDER — GABAPENTIN 300 MG PO CAPS: 300.0000 mg | ORAL_CAPSULE | Freq: Three times a day (TID) | ORAL | Status: DC

## 2013-05-27 NOTE — ED Notes (Signed)
Dr Effie Shy notified about pt c/o of pain to mouth.

## 2013-05-27 NOTE — ED Notes (Signed)
Charge talked with Aldona Lento at [facility], pt to be evaluated by psych md for either admission or discharge.

## 2013-05-27 NOTE — Consult Note (Signed)
[facility] Face-to-Face Psychiatry Consult   Reason for Consult:  Depression and anxiety Referring Physician:  ER MD   Lisa Waters is an 32 y.o. female.  Assessment: AXIS I:  Anxiety Disorder NOS AXIS II:  Deferred AXIS III:   Past Medical History  Diagnosis Date  . History of hodgkin's lymphoma 2007    in remission, sees [facility], Dr. Greggory Stallion; prior chemotherapy and radiation therapy; sees yearly in December  . Hypothyroidism   . Depression   . Anxiety   . GERD (gastroesophageal reflux disease)   . MVP (mitral valve prolapse)     Dr. Herbie Baltimore, [facility]; Moderate  MR; EF 60-65%  . Migraine     associated ocular symptoms, Dr. Brandon Melnick, [facility]  . Abnormal finding on chest xray 09/2012    COPD changes with right upper lobe scarring  . Palpitations 4/14    holter monitor, Dr. Herbie Baltimore; no arrhythmia   . Moderate mitral regurgitation by prior echocardiogram 09/2012  . History of mammogram 12/2012    normal  . H/O amaurosis fugax Fall 2012    MRI of brain/brain stem: no acute abnormality, small area of encephalomalacia left superior cerebellum that could be prior trauma, infection, or lacunar infarct  . Reactive airway disease with wheezing     PFTs March 2014: mild obstructive defect, normal volumes, normal diffusion, +response to bronchodilator, FEV1 96% predicted, FEV1/FVC 69%; Pulmonology consult 4/14, thought to be related to non-selective beta blockers - she had these symptoms before beta blockers.  Pindolol was changed to Bystolic  . Asthma    AXIS IV:  chronic anxiety AXIS V:  51-60 moderate symptoms  Plan:  No evidence of imminent risk to self or others at present.    Subjective:   Lisa Waters is a 32 y.o. female patient admitted with anxiety and depression.  HPI:  [name] says she has chronic anxiety with panic attacks and secondary depression.  She is worried about her health.  She is not suicidal and is not homicidal.  She recently stopped Zolft abruptly and started Lexapro so  she may be having some SSRI withdrawal in addition.  She has not been seeing a therapist but has set up with one. HPI Elements:     Past Psychiatric History: Past Medical History  Diagnosis Date  . History of hodgkin's lymphoma 2007    in remission, sees [facility], Dr. Greggory Stallion; prior chemotherapy and radiation therapy; sees yearly in December  . Hypothyroidism   . Depression   . Anxiety   . GERD (gastroesophageal reflux disease)   . MVP (mitral valve prolapse)     Dr. Herbie Baltimore, [facility]; Moderate  MR; EF 60-65%  . Migraine     associated ocular symptoms, Dr. Brandon Melnick, [facility]  . Abnormal finding on chest xray 09/2012    COPD changes with right upper lobe scarring  . Palpitations 4/14    holter monitor, Dr. Herbie Baltimore; no arrhythmia   . Moderate mitral regurgitation by prior echocardiogram 09/2012  . History of mammogram 12/2012    normal  . H/O amaurosis fugax Fall 2012    MRI of brain/brain stem: no acute abnormality, small area of encephalomalacia left superior cerebellum that could be prior trauma, infection, or lacunar infarct  . Reactive airway disease with wheezing     PFTs March 2014: mild obstructive defect, normal volumes, normal diffusion, +response to bronchodilator, FEV1 96% predicted, FEV1/FVC 69%; Pulmonology consult 4/14, thought to be related to non-selective beta blockers - she had these symptoms before  beta blockers.  Pindolol was changed to Bystolic  . Asthma     reports that she quit smoking about 3 years ago. Her smoking use included Cigarettes. She has a 10 pack-year smoking history. She has never used smokeless tobacco. She reports that she drinks alcohol. She reports that she does not use illicit drugs. Family History  Problem Relation Age of Onset  . Skin cancer Father   . Emphysema Paternal Grandfather     was a smoker  . Diabetes Maternal Aunt   . Stroke Maternal Grandmother   . Cancer Paternal Grandmother     metastasis  . Heart disease Neg Hx   .  Hypertension Neg Hx   . Hyperlipidemia Neg Hx    Family History Substance Abuse: No Family Supports: Yes, List: (spouse) Living Arrangements: Spouse/significant other Can pt return to current living arrangement?: Yes   Allergies:   Allergies  Allergen Reactions  . Codeine Itching  . Iohexol Hives  . Pindolol     Avoid non selective beta blockers due to dyspnea    ACT Assessment Complete:  Yes:    Educational Status    Risk to Self: Risk to self Suicidal Ideation: No Suicidal Intent: No Is patient at risk for suicide?: No Suicidal Plan?: No Access to Means: Yes Specify Access to Suicidal Means: multiple firearms in teh home What has been your use of drugs/alcohol within the last 12 months?: drinking to self medicate Previous Attempts/Gestures: No How many times?: 0 Intentional Self Injurious Behavior: None Family Suicide History: No Recent stressful life event(s): Recent negative physical changes (many health concerns) Persecutory voices/beliefs?: No Depression: Yes Depression Symptoms: Despondent;Tearfulness;Isolating;Fatigue;Guilt;Loss of interest in usual pleasures;Feeling worthless/self pity Substance abuse history and/or treatment for substance abuse?: No Suicide prevention information given to non-admitted patients: Not applicable  Risk to Others: Risk to Others Homicidal Ideation: No Thoughts of Harm to Others: No Current Homicidal Intent: No Current Homicidal Plan: No Access to Homicidal Means: No History of harm to others?: No Assessment of Violence: None Noted Does patient have access to weapons?: No Criminal Charges Pending?: No Does patient have a court date: No  Abuse:    Prior Inpatient Therapy: Prior Inpatient Therapy Prior Inpatient Therapy: No Prior Therapy Dates: '''  Prior Outpatient Therapy: Prior Outpatient Therapy Prior Outpatient Therapy: Yes Prior Therapy Dates: just started Monday  Additional Information: Additional Information 1:1 In  Past 12 Months?: No CIRT Risk: No Elopement Risk: No Does patient have medical clearance?: Yes                  Objective: Blood pressure 115/71, pulse 72, temperature 98.7 F (37.1 C), temperature source Oral, resp. rate 20, last menstrual period 05/09/2013, SpO2 98.00%.There is no weight on file to calculate BMI. Results for orders placed during the hospital encounter of 05/26/13 (from the past 72 hour(s))  URINE RAPID DRUG SCREEN (HOSP PERFORMED)     Status: None   Collection Time    05/26/13  4:45 PM      Result Value Range   Opiates NONE DETECTED  NONE DETECTED   Cocaine NONE DETECTED  NONE DETECTED   Benzodiazepines NONE DETECTED  NONE DETECTED   Amphetamines NONE DETECTED  NONE DETECTED   Tetrahydrocannabinol NONE DETECTED  NONE DETECTED   Barbiturates NONE DETECTED  NONE DETECTED   Comment:            DRUG SCREEN FOR MEDICAL PURPOSES     ONLY.  IF CONFIRMATION IS NEEDED  FOR ANY PURPOSE, NOTIFY LAB     WITHIN 5 DAYS.                LOWEST DETECTABLE LIMITS     FOR URINE DRUG SCREEN     Drug Class       Cutoff (ng/mL)     Amphetamine      1000     Barbiturate      200     Benzodiazepine   200     Tricyclics       300     Opiates          300     Cocaine          300     THC              50  POCT PREGNANCY, URINE     Status: None   Collection Time    05/26/13  4:50 PM      Result Value Range   Preg Test, Ur NEGATIVE  NEGATIVE   Comment:            THE SENSITIVITY OF THIS     METHODOLOGY IS >24 mIU/mL  ACETAMINOPHEN LEVEL     Status: None   Collection Time    05/26/13  4:55 PM      Result Value Range   Acetaminophen (Tylenol), Serum <15.0  10 - 30 ug/mL   Comment:            THERAPEUTIC CONCENTRATIONS VARY     SIGNIFICANTLY. A RANGE OF 10-30     ug/mL MAY BE AN EFFECTIVE     CONCENTRATION FOR MANY PATIENTS.     HOWEVER, SOME ARE BEST TREATED     AT CONCENTRATIONS OUTSIDE THIS     RANGE.     ACETAMINOPHEN CONCENTRATIONS     >150 ug/mL AT  4 HOURS AFTER     INGESTION AND >50 ug/mL AT 12     HOURS AFTER INGESTION ARE     OFTEN ASSOCIATED WITH TOXIC     REACTIONS.  CBC     Status: None   Collection Time    05/26/13  4:55 PM      Result Value Range   WBC 4.8  4.0 - 10.5 K/uL   RBC 4.00  3.87 - 5.11 MIL/uL   Hemoglobin 12.4  12.0 - 15.0 g/dL   HCT 16.1  09.6 - 04.5 %   MCV 91.0  78.0 - 100.0 fL   MCH 31.0  26.0 - 34.0 pg   MCHC 34.1  30.0 - 36.0 g/dL   RDW 40.9  81.1 - 91.4 %   Platelets 223  150 - 400 K/uL  COMPREHENSIVE METABOLIC PANEL     Status: None   Collection Time    05/26/13  4:55 PM      Result Value Range   Sodium 135  135 - 145 mEq/L   Potassium 3.9  3.5 - 5.1 mEq/L   Chloride 101  96 - 112 mEq/L   CO2 22  19 - 32 mEq/L   Glucose, Bld 84  70 - 99 mg/dL   BUN 11  6 - 23 mg/dL   Creatinine, Ser 7.82  0.50 - 1.10 mg/dL   Calcium 9.2  8.4 - 95.6 mg/dL   Total Protein 7.1  6.0 - 8.3 g/dL   Albumin 4.1  3.5 - 5.2 g/dL   AST 20  0 - 37 U/L   ALT  9  0 - 35 U/L   Alkaline Phosphatase 47  39 - 117 U/L   Total Bilirubin 0.5  0.3 - 1.2 mg/dL   GFR calc non Af Amer >90  >90 mL/min   GFR calc Af Amer >90  >90 mL/min   Comment: (NOTE)     The eGFR has been calculated using the CKD EPI equation.     This calculation has not been validated in all clinical situations.     eGFR's persistently <90 mL/min signify possible Chronic Kidney     Disease.  ETHANOL     Status: None   Collection Time    05/26/13  4:55 PM      Result Value Range   Alcohol, Ethyl (B) <11  0 - 11 mg/dL   Comment:            LOWEST DETECTABLE LIMIT FOR     SERUM ALCOHOL IS 11 mg/dL     FOR MEDICAL PURPOSES ONLY  SALICYLATE LEVEL     Status: Abnormal   Collection Time    05/26/13  4:55 PM      Result Value Range   Salicylate Lvl <2.0 (*) 2.8 - 20.0 mg/dL   Labs are reviewed and are pertinent for no psychiatric issue.  Current Facility-Administered Medications  Medication Dose Route Frequency Provider Last Rate Last Dose  . alum &  mag hydroxide-simeth (MAALOX/MYLANTA) 200-200-20 MG/5ML suspension 30 mL  30 mL Oral PRN Trevor Mace, PA-C   30 mL at 05/27/13 0654  . ibuprofen (ADVIL,MOTRIN) tablet 600 mg  600 mg Oral Q8H PRN Trevor Mace, PA-C   600 mg at 05/27/13 1306  . LORazepam (ATIVAN) tablet 1 mg  1 mg Oral Q8H PRN Trevor Mace, PA-C   1 mg at 05/27/13 1306  . magic mouthwash  2 mL Oral TID PRN Trevor Mace, PA-C   2 mL at 05/26/13 2036  . nicotine (NICODERM CQ - dosed in mg/24 hours) patch 21 mg  21 mg Transdermal Daily Robyn M Albert, PA-C      . ondansetron [facility]) tablet 4 mg  4 mg Oral Q8H PRN Trevor Mace, PA-C      . zolpidem [facility]) tablet 5 mg  5 mg Oral QHS PRN Trevor Mace, PA-C   5 mg at 05/26/13 2216   Current Outpatient Prescriptions  Medication Sig Dispense Refill  . aspirin 81 MG tablet Take 81 mg by mouth daily.      Marland Kitchen escitalopram (LEXAPRO) 10 MG tablet Take 1 tablet (10 mg total) by mouth daily.  30 tablet  0  . famotidine (PEPCID) 20 MG tablet One at bedtime  30 tablet  11  . hyoscyamine (LEVBID) 0.375 MG 12 hr tablet Take 1 tablet (0.375 mg total) by mouth every 12 (twelve) hours as needed for cramping.  20 tablet  0  . ibuprofen (ADVIL,MOTRIN) 800 MG tablet Take 1 tablet (800 mg total) by mouth every 8 (eight) hours as needed for pain.  30 tablet  0  . levothyroxine (SYNTHROID, LEVOTHROID) 88 MCG tablet Take 88 mcg by mouth daily before breakfast.      . LORazepam (ATIVAN) 1 MG tablet Take 0.5 tablets (0.5 mg total) by mouth 2 (two) times daily as needed for anxiety.  20 tablet  1  . nebivolol (BYSTOLIC) 5 MG tablet Take 1 tablet (5 mg total) by mouth daily.  90 tablet  3  . omeprazole (PRILOSEC) 40 MG capsule Take 1  capsule (40 mg total) by mouth daily.  90 capsule  0  . SUMAtriptan (IMITREX) 100 MG tablet Take 1 tablet (100 mg total) by mouth every 2 (two) hours as needed for migraine. Not to exceed more than 2 tablets per 24 hours  10 tablet  1  . traMADol (ULTRAM) 50 MG tablet  Take 1 tablet (50 mg total) by mouth every 8 (eight) hours as needed for pain.  30 tablet  0    Psychiatric Specialty Exam:     Blood pressure 115/71, pulse 72, temperature 98.7 F (37.1 C), temperature source Oral, resp. rate 20, last menstrual period 05/09/2013, SpO2 98.00%.There is no weight on file to calculate BMI.  General Appearance: Casual  Eye Contact::  Good  Speech:  Clear and Coherent and Normal Rate  Volume:  Normal  Mood:  Anxious, Depressed and tearful  Affect:  Congruent  Thought Process:  Coherent and Logical  Orientation:  Full (Time, Place, and Person)  Thought Content:  Negative  Suicidal Thoughts:  No  Homicidal Thoughts:  No  Memory:  Immediate;   Good Recent;   Good Remote;   Good  Judgement:  Intact  Insight:  Fair  Psychomotor Activity:  Normal  Concentration:  Good  Recall:  Good  Akathisia:  Negative  Handed:  Right  AIMS (if indicated):     Assets:  Communication Skills Desire for Improvement Financial Resources/Insurance Housing Intimacy Leisure Time Physical Health Resilience Social Support Talents/Skills Transportation Vocational/Educational  Sleep:      Treatment Plan Summary: discharge home today.  I will write a prescription for gabapentin  and Lexapro if she might run out before her appt with psychiatrist. I will advise her to take Zoloft 50 mg for the next week  Rosan Calbert D 05/27/2013 1:41 PM

## 2013-05-27 NOTE — Progress Notes (Signed)
Lengthy visit. Patient tearful during most of the visit. She talked about her anxiety and panic attacks. She told me she has had these before when she was going through a particularly stressful time at work. She thinks this time they were triggered by changing medical conditions that she is facing. After further discussion she said that she is anxious that something physical will happen to prevent her from really living life and she feels she has not really gotten to live. We explored what life might look like if she lived the life that she desires but she was not able to articulate this. She has good family and friend support. She attends [employer]. Support through listening; clarifying questions. She was receptive to trying a deep breathing meditation and guided imagery as possible "tools" that she might use to reduce her anxiety.  I explained these and we practiced it, but she reported that she still felt anxious afterwards. Mostly offered support, encouragement, and affirmation for who she is.

## 2013-05-27 NOTE — ED Provider Notes (Signed)
Medical screening examination/treatment/procedure(s) were performed by non-physician practitioner and as supervising physician I was immediately available for consultation/collaboration.  EKG Interpretation   None         Kadence Mimbs E Clessie Karras, MD 05/27/13 1400 

## 2013-05-27 NOTE — Progress Notes (Addendum)
Writer consulted with the Psychiatrist (Dr. Ladona Ridgel) and the NP [facility]) regarding the patient not meeting criteria for inpatient hospitalization.   Writer scheduled an outpatient medication management appointment for the patient at [facility] in [place] on 06-17-2013 at 1:30pm.  The Psychiatrist wrote a 30 day prescription for Gabapentin 300mg  3x daily and Lexapro 20mg  1x daily.  Patient will be able to follow up with her current outpatient mental health provider [facility].   Writer informed the ER MD (Dr. Effie Shy) and the nurse working with the patient.

## 2013-05-28 ENCOUNTER — Telehealth: Payer: Self-pay | Admitting: Family Medicine

## 2013-05-29 ENCOUNTER — Ambulatory Visit (INDEPENDENT_AMBULATORY_CARE_PROVIDER_SITE_OTHER): Payer: Managed Care, Other (non HMO) | Admitting: Family Medicine

## 2013-05-29 ENCOUNTER — Encounter: Payer: Self-pay | Admitting: Family Medicine

## 2013-05-29 VITALS — BP 110/70 | HR 80 | Wt 142.0 lb

## 2013-05-29 DIAGNOSIS — K2210 Ulcer of esophagus without bleeding: Secondary | ICD-10-CM

## 2013-05-29 DIAGNOSIS — K120 Recurrent oral aphthae: Secondary | ICD-10-CM

## 2013-05-29 DIAGNOSIS — F329 Major depressive disorder, single episode, unspecified: Secondary | ICD-10-CM

## 2013-05-29 MED ORDER — ESOMEPRAZOLE MAGNESIUM 40 MG PO CPDR: 40.0000 mg | DELAYED_RELEASE_CAPSULE | Freq: Every day | ORAL | Status: DC

## 2013-05-29 MED ORDER — TRIAMCINOLONE ACETONIDE 0.1 % MT PSTE: 1.0000 "application " | PASTE | Freq: Two times a day (BID) | OROMUCOSAL | Status: DC

## 2013-05-29 NOTE — Patient Instructions (Addendum)
Use the Nexium and try the Xylocaine right before you eat approximately 20 minutes and see if that will help with swallowing Take Neurontin twice per day

## 2013-05-29 NOTE — Progress Notes (Signed)
  Subjective:    Patient ID: Lisa Waters, female    DOB: 14-Oct-1980, 32 y.o.   MRN: [id number]  HPI She is here for recheck. She was recently seen in the emergency room and sent home on Neurontin 300 mg 3 times a day. She apparently has been doing a tremendous amount of sleeping. She continues had difficulty with mouth sores. She was given Magic mouthwash in the hospital. She's also had some difficulty with dysphasia and was seen by Dr. Loreta Ave. It did show esophageal ulcerations. She states that the Dexilant is not working as well as she would like. She continues to have difficulty especially with being alone and has been spending time with her husband when he goes to work. He owns his own business.   Review of Systems     Objective:   Physical Exam Alert and not crying. She actually looks slightly better than her last visit. Exam of the mouth does show alterations present on the lower lip mucosa. Throat is clear. Neck is supple without adenopathy.       Assessment & Plan:  Aphthous ulcer of mouth - Plan: triamcinolone (KENALOG) 0.1 % paste  Esophageal ulcer without bleeding - Plan: esomeprazole (NEXIUM) 40 MG capsule  Major depression  recommend she reduce her Neurontin to twice a day dosing. She is to use Kenalog and Orabase on the lip lesion. I will switch her to Nexium since she did run out of the Dexilant. She will continue in counseling. Disability forms were filled out.

## 2013-06-04 ENCOUNTER — Telehealth: Payer: Self-pay | Admitting: Internal Medicine

## 2013-06-04 NOTE — Telephone Encounter (Signed)
Faxed over medical records to Disability determination services @ [phone] on 06/03/13

## 2013-06-05 NOTE — Telephone Encounter (Signed)
lm

## 2013-06-16 ENCOUNTER — Ambulatory Visit: Payer: Self-pay | Admitting: Family Medicine

## 2013-06-17 ENCOUNTER — Ambulatory Visit (INDEPENDENT_AMBULATORY_CARE_PROVIDER_SITE_OTHER): Payer: Managed Care, Other (non HMO) | Admitting: Psychiatry

## 2013-06-17 ENCOUNTER — Encounter (HOSPITAL_COMMUNITY): Payer: Self-pay | Admitting: Psychiatry

## 2013-06-17 VITALS — BP 90/80 | Ht 67.0 in | Wt 149.0 lb

## 2013-06-17 DIAGNOSIS — F411 Generalized anxiety disorder: Secondary | ICD-10-CM

## 2013-06-17 DIAGNOSIS — F1010 Alcohol abuse, uncomplicated: Secondary | ICD-10-CM

## 2013-06-17 DIAGNOSIS — F329 Major depressive disorder, single episode, unspecified: Secondary | ICD-10-CM

## 2013-06-17 MED ORDER — GABAPENTIN 300 MG PO CAPS: 300.0000 mg | ORAL_CAPSULE | Freq: Three times a day (TID) | ORAL | Status: DC

## 2013-06-17 MED ORDER — ESCITALOPRAM OXALATE 20 MG PO TABS: 20.0000 mg | ORAL_TABLET | Freq: Two times a day (BID) | ORAL | Status: DC
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
## 2013-06-26 ENCOUNTER — Ambulatory Visit (INDEPENDENT_AMBULATORY_CARE_PROVIDER_SITE_OTHER): Payer: Managed Care, Other (non HMO) | Admitting: Psychiatry

## 2013-06-26 DIAGNOSIS — F329 Major depressive disorder, single episode, unspecified: Secondary | ICD-10-CM

## 2013-06-26 DIAGNOSIS — F411 Generalized anxiety disorder: Secondary | ICD-10-CM

## 2013-07-01 ENCOUNTER — Other Ambulatory Visit: Payer: Self-pay | Admitting: Medical

## 2013-07-01 ENCOUNTER — Ambulatory Visit (INDEPENDENT_AMBULATORY_CARE_PROVIDER_SITE_OTHER): Payer: Managed Care, Other (non HMO) | Admitting: Medical

## 2013-07-01 ENCOUNTER — Encounter: Payer: Self-pay | Admitting: Medical

## 2013-07-01 ENCOUNTER — Telehealth: Payer: Self-pay | Admitting: Internal Medicine

## 2013-07-01 ENCOUNTER — Telehealth: Payer: Self-pay | Admitting: Medical

## 2013-07-01 VITALS — BP 92/60 | HR 76 | Temp 98.3°F | Resp 16 | Wt 147.0 lb

## 2013-07-01 DIAGNOSIS — K208 Other esophagitis without bleeding: Secondary | ICD-10-CM

## 2013-07-01 DIAGNOSIS — K1370 Unspecified lesions of oral mucosa: Secondary | ICD-10-CM

## 2013-07-01 DIAGNOSIS — F32A Depression, unspecified: Secondary | ICD-10-CM

## 2013-07-01 DIAGNOSIS — F341 Dysthymic disorder: Secondary | ICD-10-CM

## 2013-07-01 DIAGNOSIS — K1379 Other lesions of oral mucosa: Secondary | ICD-10-CM

## 2013-07-01 DIAGNOSIS — F1010 Alcohol abuse, uncomplicated: Secondary | ICD-10-CM

## 2013-07-01 DIAGNOSIS — K2210 Ulcer of esophagus without bleeding: Secondary | ICD-10-CM

## 2013-07-01 DIAGNOSIS — F329 Major depressive disorder, single episode, unspecified: Secondary | ICD-10-CM

## 2013-07-01 MED ORDER — SUCRALFATE 1 GM/10ML PO SUSP: ORAL | Status: DC
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
## 2013-07-01 NOTE — Patient Instructions (Signed)
Discussed orally 

## 2013-07-01 NOTE — Progress Notes (Signed)
Subjective: Here today for complaint of recurrent canker sores in her mouth. She notes a long history of this dating many years. She currently has 2 ulcer areas in her mouth that are unchanged for the last month. She has tried Magic mouthwash, triamcinolone in Kenalog paste topically, other over-the-counter treatments without success.  She is frustrated as to why she keeps getting these, in addition to her other health problems. She is seeing mental health currently for anxiety. She also has been self-medicating with alcohol, states that she is drinking several alcoholic drinks daily.  She saw Dr. Lavonia Drafts back in September for endoscopy, apparently had ulcers in her esophagus.  Denies history of GI bleed. No other aggravating or relieving factors. No other complaint.  Past Medical History  Diagnosis Date  . History of hodgkin's lymphoma 2007    in remission, sees [facility], Dr. Greggory Stallion; prior chemotherapy and radiation therapy; sees yearly in December  . Hypothyroidism   . Depression   . Anxiety   . GERD (gastroesophageal reflux disease)   . MVP (mitral valve prolapse)     Dr. Herbie Baltimore, [facility]; Moderate  MR; EF 60-65%  . Migraine     associated ocular symptoms, Dr. Brandon Melnick, [facility]  . Abnormal finding on chest xray 09/2012    COPD changes with right upper lobe scarring  . Palpitations 4/14    holter monitor, Dr. Herbie Baltimore; no arrhythmia   . Moderate mitral regurgitation by prior echocardiogram 09/2012  . History of mammogram 12/2012    normal  . H/O amaurosis fugax Fall 2012    MRI of brain/brain stem: no acute abnormality, small area of encephalomalacia left superior cerebellum that could be prior trauma, infection, or lacunar infarct  . Reactive airway disease with wheezing     PFTs March 2014: mild obstructive defect, normal volumes, normal diffusion, +response to bronchodilator, FEV1 96% predicted, FEV1/FVC 69%; Pulmonology consult 4/14, thought to be related to non-selective beta blockers -  she had these symptoms before beta blockers.  Pindolol was changed to Bystolic  . Asthma     Review of Systems Constitutional: -fever, -chills, -sweats, -unexpected weight change,-fatigue Skin: No obvious rash ENT: -runny nose, -ear pain, -sore throat Cardiology:  -chest pain, -palpitations, -edema Respiratory: -cough, -shortness of breath, -wheezing Gastroenterology: -abdominal pain, -nausea, -vomiting, +loose stools over weekend, -constipation  Hematology: -bleeding or bruising problems Musculoskeletal: -arthralgias, -myalgias, -joint swelling, -back pain Ophthalmology: -vision changes Urology: -dysuria, -difficulty urinating, -hematuria, -urinary frequency, -urgency Neurology: -headache, -weakness, -tingling, -numbness   Objective: Filed Vitals:   07/01/13 1156  BP: 92/60  Pulse: 76  Temp: 98.3 F (36.8 C)  Resp: 16    General appearance: alert, no distress, WD/WN, somewhat anxious appearing as is typical for her, no strong alcohol odor HEENT: normocephalic, sclerae anicteric, TMs pearly, nares patent, no discharge or erythema, pharynx normal Oral cavity: MMM, right lower lip internally with large 2 cm x 1 cm ulceration, left lower lip with smaller 1 cm diameter ulceration Neck: supple, shoddy anterior tender nodes, no thyromegaly, no masses Lungs: CTA bilaterally, no wheezes, rhonchi, or rales Abdomen: +bs, soft, non tender, non distended, no masses, no hepatomegaly, no splenomegaly Pulses: 2+ symmetric, upper and lower extremities, normal cap refill Neuro: CN2-12 intact, nonfocal, alert and oriented x 3 Psych: anxious, tearful at times, but answers questions appropriately   Assessment: Encounter Diagnoses  Name Primary?  . Recurrent mouth ulceration Yes  . Anxiety and depression   . Alcohol abuse   . Ulcerative esophagitis  Plan: We discussed possible differential for mouth ulcers. Advise she get help with her alcohol abuse, advise she contact her mental  health provider today to get back in this week.    I called and spoke to Dr. Loreta Ave her gastroenterologist about her case and symptoms today and recent endoscopy.  We then called back Mrs. Steffek about the following:   She needs to see oral surgeon for biopsy of the mouth ulcer, with results forwarded to Dr. Loreta Ave and myself.  Nurse will try and get her in the next few days preferably since she has the active ulcers now If she wants to try a dose of steroids for the current ulcer, then we will need to check her liver function first (lab/nurse visit). This assumes we would do this after the biopsy has been done.   Dr. Loreta Ave and both strongly recommend she gradually wean down on alcohol over the next 5-7 days. If she is drinking several alcohol drinks daily, given the esophageal ulcers, she could potential set her self up for a bleed which is serious. Thus, don't stop cold [place], but wean down alcohol gradually this week. I recommend she get in with her counselor/mental health this week to help with her anxiety, alcohol use, etc.   Lastly, I will send Carafate to the pharmacy. This is a medication to coat the esophagus and stomach. It has to be taken 1 hour before lunch and dinner and can use at bedtime. It can't be given in the morning due to the fact that it needs to be taken >4 hours after her thyroid medication. While she is taking Carafate, have her take the omeprazole and famotidine BID with breakfast and dinner.  Follow-up pending referral

## 2013-07-01 NOTE — Telephone Encounter (Signed)
I spoke to Dr. Loreta Ave about her case.   We discussed evaluation, treatment options.  Her recommendations are as follows:   She needs to see oral surgeon for biopsy of the mouth ulcer, with results forwarded to Dr. Loreta Ave and myself.   Try and get her in the next few days preferably since she has the active ulcers now  If she wants to try a dose of steroids for the current ulcer, then we will need to check her liver function first (lab/nurse visit).  This assumes we would do this after the biopsy has been done.  Dr. Loreta Ave and both strongly recommend she gradually wean down on alcohol over the next 5-7 days.  If she is drinking several alcohol drinks daily, given the esophageal ulcers, she could potential set her self up for a bleed which is serious.  Thus, don't stop cold [place], but wean down alcohol gradually this week.  I recommend she get in with her counselor/mental health this week to help with her anxiety, alcohol use, etc.   Lastly, I will send Carafate to the pharmacy. This is a medication to coat the esophagus and stomach.  It has to be taken 1 hour before lunch and dinner and can use at bedtime.  It can't be given in the morning due to the fact that it needs to be taken >4 hours after her thyroid medication.  While she is taking Carafate, have her take the omeprazole and famotidine BID with breakfast and dinner.

## 2013-07-01 NOTE — Telephone Encounter (Signed)
I tried to call her twice before leaving but no answer. CLS

## 2013-07-01 NOTE — Telephone Encounter (Signed)
Faxed over medical records to [employer] @ [phone] 

## 2013-07-02 NOTE — Telephone Encounter (Signed)
Patient is aware of what Kristian Covey PA-C recommends for her care and treatment. She is also aware of ehr appointment to see Dr. Barbette Merino on 07/08/13 @ 245 pm [phone]. CLS [address] [place], [place]

## 2013-07-07 DIAGNOSIS — Z029 Encounter for administrative examinations, unspecified: Secondary | ICD-10-CM

## 2013-07-08 ENCOUNTER — Ambulatory Visit (INDEPENDENT_AMBULATORY_CARE_PROVIDER_SITE_OTHER): Payer: Managed Care, Other (non HMO) | Admitting: Psychiatry

## 2013-07-08 DIAGNOSIS — F329 Major depressive disorder, single episode, unspecified: Secondary | ICD-10-CM

## 2013-07-08 DIAGNOSIS — F411 Generalized anxiety disorder: Secondary | ICD-10-CM
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
## 2013-07-08 NOTE — Patient Instructions (Signed)
Discussed orally 

## 2013-07-15 ENCOUNTER — Telehealth: Payer: Self-pay | Admitting: Family Medicine

## 2013-07-16 NOTE — Telephone Encounter (Signed)
Make the call and let's lady know that we didn't take her out on disability and therefore cannot give any information.

## 2013-07-16 NOTE — Telephone Encounter (Signed)
CALLED [employer] TALKED WITH LAURA AND INFORMED HER DR.LALONDE DID NOT TAKE HER OUT THEY HAD [place] DOWN AS PHYS. SHE SAID SHE WOULD LET CASE MANAGER KNOW WE ARE PCP NOT [facility].

## 2013-07-21 ENCOUNTER — Ambulatory Visit (HOSPITAL_COMMUNITY): Payer: Self-pay | Admitting: Psychiatry

## 2013-07-24 ENCOUNTER — Telehealth: Payer: Self-pay | Admitting: Internal Medicine

## 2013-07-24 ENCOUNTER — Encounter: Payer: Self-pay | Admitting: Family Medicine

## 2013-07-24 NOTE — Telephone Encounter (Signed)
Pt states her work is requiring a note stating that she has been out of work since 12/16 and can return after her appointment on 08/05/13 depening on what the results of her biposy are. Pt states she had biopsy done 07/22/13 and will get the results when she goes back to the Dr. on 08/05/13. If you have any questions you can call pt

## 2013-07-24 NOTE — Telephone Encounter (Signed)
Pt  States that insurance company is requiring you to write the letter since your the one who referred her for the biopsy

## 2013-07-24 NOTE — Telephone Encounter (Signed)
She will need this from her psychiatrist or counselor that is seeing her.

## 2013-07-25 NOTE — Telephone Encounter (Signed)
I didn't take her out of work.  Oral ulcers would not be a reason I could write someone out of work.  She has other issues she is dealing with through a counselor.  Also, if the surgeon feels her oral ulcers are bad enough to write her out of work then fine, but I didn't taker her out of work.  Thus, I can't sign off or write a letter attesting to that.

## 2013-07-25 NOTE — Telephone Encounter (Signed)
Pt was notified that [facility]ane can not take her out of work.

## 2013-07-29 ENCOUNTER — Ambulatory Visit (INDEPENDENT_AMBULATORY_CARE_PROVIDER_SITE_OTHER): Payer: Managed Care, Other (non HMO) | Admitting: Psychiatry

## 2013-07-29 ENCOUNTER — Encounter (HOSPITAL_COMMUNITY): Payer: Self-pay | Admitting: Psychiatry

## 2013-07-29 VITALS — BP 110/60 | Ht 67.0 in | Wt 154.0 lb

## 2013-07-29 DIAGNOSIS — F411 Generalized anxiety disorder: Secondary | ICD-10-CM

## 2013-07-29 MED ORDER — ESCITALOPRAM OXALATE 20 MG PO TABS: 20.0000 mg | ORAL_TABLET | Freq: Two times a day (BID) | ORAL | Status: DC

## 2013-07-29 MED ORDER — GABAPENTIN 300 MG PO CAPS: 300.0000 mg | ORAL_CAPSULE | Freq: Three times a day (TID) | ORAL | Status: DC
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
## 2013-07-30 ENCOUNTER — Ambulatory Visit (INDEPENDENT_AMBULATORY_CARE_PROVIDER_SITE_OTHER): Payer: Managed Care, Other (non HMO) | Admitting: Psychiatry

## 2013-07-30 DIAGNOSIS — F411 Generalized anxiety disorder: Secondary | ICD-10-CM

## 2013-07-30 DIAGNOSIS — F329 Major depressive disorder, single episode, unspecified: Secondary | ICD-10-CM
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
## 2013-07-30 NOTE — Patient Instructions (Signed)
Discussed orally 

## 2013-08-01 ENCOUNTER — Telehealth (HOSPITAL_COMMUNITY): Payer: Self-pay | Admitting: Psychiatry

## 2013-08-01 NOTE — Telephone Encounter (Signed)
Therapist called patient and left message responding to patient's email regarding information for medical leave. Therapist advised that patient have insurance company/employer fax request and forms for needed information to this practice and provided patient with fax number. Therapist also left notification that patient will need to sign a consent to  release information.

## 2013-08-07 ENCOUNTER — Other Ambulatory Visit: Payer: Self-pay | Admitting: Family Medicine

## 2013-08-13 ENCOUNTER — Telehealth (HOSPITAL_COMMUNITY): Payer: Self-pay | Admitting: *Deleted

## 2013-08-14 ENCOUNTER — Ambulatory Visit (INDEPENDENT_AMBULATORY_CARE_PROVIDER_SITE_OTHER): Payer: Managed Care, Other (non HMO) | Admitting: Psychiatry

## 2013-08-14 DIAGNOSIS — F329 Major depressive disorder, single episode, unspecified: Secondary | ICD-10-CM

## 2013-08-14 DIAGNOSIS — F411 Generalized anxiety disorder: Secondary | ICD-10-CM

## 2013-08-14 NOTE — Patient Instructions (Signed)
Discussed orally 
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
## 2013-08-20 ENCOUNTER — Inpatient Hospital Stay (HOSPITAL_COMMUNITY): Admission: RE | Admit: 2013-08-20 | Payer: Self-pay | Source: Ambulatory Visit

## 2013-08-21 ENCOUNTER — Telehealth (HOSPITAL_COMMUNITY): Payer: Self-pay | Admitting: *Deleted

## 2013-08-22 ENCOUNTER — Ambulatory Visit (INDEPENDENT_AMBULATORY_CARE_PROVIDER_SITE_OTHER): Payer: Managed Care, Other (non HMO) | Admitting: Psychiatry

## 2013-08-22 ENCOUNTER — Encounter (HOSPITAL_COMMUNITY): Payer: Self-pay | Admitting: Psychiatry

## 2013-08-22 ENCOUNTER — Encounter: Payer: Self-pay | Admitting: Family Medicine

## 2013-08-22 VITALS — BP 96/62 | Ht 67.0 in | Wt 151.0 lb

## 2013-08-22 DIAGNOSIS — F411 Generalized anxiety disorder: Secondary | ICD-10-CM

## 2013-08-22 DIAGNOSIS — F1010 Alcohol abuse, uncomplicated: Secondary | ICD-10-CM

## 2013-08-22 DIAGNOSIS — F329 Major depressive disorder, single episode, unspecified: Secondary | ICD-10-CM

## 2013-08-22 MED ORDER — LORAZEPAM 0.5 MG PO TABS: 0.5000 mg | ORAL_TABLET | Freq: Two times a day (BID) | ORAL | Status: DC

## 2013-08-22 MED ORDER — DULOXETINE HCL 60 MG PO CPEP: 60.0000 mg | ORAL_CAPSULE | Freq: Every day | ORAL | Status: DC
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
## 2013-08-29 ENCOUNTER — Ambulatory Visit (HOSPITAL_COMMUNITY): Payer: Self-pay | Admitting: Psychiatry

## 2013-09-08 ENCOUNTER — Ambulatory Visit (HOSPITAL_COMMUNITY): Payer: Self-pay

## 2013-09-15 ENCOUNTER — Encounter: Payer: Self-pay | Admitting: Family Medicine

## 2013-09-15 ENCOUNTER — Ambulatory Visit (HOSPITAL_COMMUNITY)
Admission: RE | Admit: 2013-09-15 | Discharge: 2013-09-15 | Disposition: A | Discharge status: Home / Self Care | Payer: Managed Care, Other (non HMO) | Source: Ambulatory Visit | Attending: Internal Medicine | Admitting: Internal Medicine

## 2013-09-15 DIAGNOSIS — I059 Rheumatic mitral valve disease, unspecified: Secondary | ICD-10-CM | POA: Insufficient documentation

## 2013-09-15 DIAGNOSIS — I340 Nonrheumatic mitral (valve) insufficiency: Secondary | ICD-10-CM

## 2013-09-15 NOTE — Progress Notes (Signed)
2D Echo Performed 09/15/2013    Dinero Chavira, RCS  

## 2013-09-16 ENCOUNTER — Telehealth: Payer: Self-pay | Admitting: *Deleted

## 2013-09-16 NOTE — Telephone Encounter (Signed)
LEFT MESSAGE TO CALL BACK ABOUT TEST RESULTS 

## 2013-09-17 ENCOUNTER — Telehealth: Payer: Self-pay | Admitting: *Deleted

## 2013-09-17 NOTE — Telephone Encounter (Signed)
Patient returned call. Spoke to patient. Result given . Verbalized understanding

## 2013-09-17 NOTE — Telephone Encounter (Signed)
Message copied by Raiford Simmonds on Wed Sep 17, 2013  8:31 AM ------      Message from: Leonie Man      Created: Tue Sep 16, 2013  4:10 PM       Relatively stable result overall. The November echo is an outlier because this does not get better and then get worse again.      As well as the ejection fraction is still greater than 55-60%, were okay monitoring this.      I will discuss with his about best possible timing for possible repair. ------

## 2013-09-22 ENCOUNTER — Encounter (HOSPITAL_COMMUNITY): Payer: Self-pay | Admitting: Psychiatry

## 2013-09-22 ENCOUNTER — Ambulatory Visit (INDEPENDENT_AMBULATORY_CARE_PROVIDER_SITE_OTHER): Payer: Managed Care, Other (non HMO) | Admitting: Psychiatry

## 2013-09-22 VITALS — BP 120/80 | Ht 67.0 in | Wt 148.0 lb

## 2013-09-22 DIAGNOSIS — F329 Major depressive disorder, single episode, unspecified: Secondary | ICD-10-CM

## 2013-09-22 DIAGNOSIS — F1010 Alcohol abuse, uncomplicated: Secondary | ICD-10-CM

## 2013-09-22 DIAGNOSIS — F411 Generalized anxiety disorder: Secondary | ICD-10-CM

## 2013-09-22 NOTE — Patient Instructions (Signed)
Use vibriid starter pack as directed
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
## 2013-09-24 ENCOUNTER — Other Ambulatory Visit: Payer: Self-pay | Admitting: Family Medicine

## 2013-09-24 ENCOUNTER — Telehealth: Payer: Self-pay | Admitting: Family Medicine

## 2013-09-24 ENCOUNTER — Telehealth (HOSPITAL_COMMUNITY): Payer: Self-pay | Admitting: *Deleted

## 2013-09-24 NOTE — Telephone Encounter (Signed)
Is this ok to refill?  

## 2013-09-24 NOTE — Telephone Encounter (Signed)
Dr. Deon Pilling with [facility] called and needs to speak with you regarding patients disability.  She is part of a 3rd party review.  Her number is [phone]

## 2013-09-25 NOTE — Telephone Encounter (Signed)
Dr Deon Pilling called again today regarding Lisa Waters's disability.

## 2013-09-26 NOTE — Telephone Encounter (Signed)
I was not aware of disability paperwork on her.   Leonie Man, MD

## 2013-09-29 ENCOUNTER — Ambulatory Visit (INDEPENDENT_AMBULATORY_CARE_PROVIDER_SITE_OTHER): Payer: Managed Care, Other (non HMO) | Admitting: Psychiatry

## 2013-09-29 DIAGNOSIS — F329 Major depressive disorder, single episode, unspecified: Secondary | ICD-10-CM

## 2013-09-29 DIAGNOSIS — F411 Generalized anxiety disorder: Secondary | ICD-10-CM
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
## 2013-09-29 NOTE — Patient Instructions (Signed)
Discussed orally 

## 2013-10-01 ENCOUNTER — Telehealth (HOSPITAL_COMMUNITY): Payer: Self-pay | Admitting: *Deleted

## 2013-10-06 ENCOUNTER — Encounter: Payer: Self-pay | Admitting: Cardiology

## 2013-10-06 ENCOUNTER — Ambulatory Visit (INDEPENDENT_AMBULATORY_CARE_PROVIDER_SITE_OTHER): Payer: Managed Care, Other (non HMO) | Admitting: Cardiology

## 2013-10-06 VITALS — BP 110/70 | Ht 67.0 in | Wt 150.0 lb

## 2013-10-06 DIAGNOSIS — E039 Hypothyroidism, unspecified: Secondary | ICD-10-CM

## 2013-10-06 DIAGNOSIS — F419 Anxiety disorder, unspecified: Secondary | ICD-10-CM

## 2013-10-06 DIAGNOSIS — I340 Nonrheumatic mitral (valve) insufficiency: Secondary | ICD-10-CM

## 2013-10-06 DIAGNOSIS — R5381 Other malaise: Secondary | ICD-10-CM

## 2013-10-06 DIAGNOSIS — R002 Palpitations: Secondary | ICD-10-CM

## 2013-10-06 DIAGNOSIS — F411 Generalized anxiety disorder: Secondary | ICD-10-CM

## 2013-10-06 DIAGNOSIS — I341 Nonrheumatic mitral (valve) prolapse: Secondary | ICD-10-CM

## 2013-10-06 DIAGNOSIS — I059 Rheumatic mitral valve disease, unspecified: Secondary | ICD-10-CM

## 2013-10-06 DIAGNOSIS — R5383 Other fatigue: Secondary | ICD-10-CM

## 2013-10-06 LAB — BASIC METABOLIC PANEL
BUN: 9 mg/dL (ref 6–23)
CALCIUM: 9.7 mg/dL (ref 8.4–10.5)
CO2: 28 meq/L (ref 19–32)
Chloride: 100 mEq/L (ref 96–112)
Creat: 0.76 mg/dL (ref 0.50–1.10)
Glucose, Bld: 84 mg/dL (ref 70–99)
POTASSIUM: 4.6 meq/L (ref 3.5–5.3)
SODIUM: 139 meq/L (ref 135–145)

## 2013-10-06 LAB — MAGNESIUM: Magnesium: 2.1 mg/dL (ref 1.5–2.5)

## 2013-10-06 NOTE — Patient Instructions (Addendum)
Have lab work today  Wear monitor for 48 hours.  Increase bystolic to 7.5 mg daily after wearing monitor.  Follow up with Dr. Ellyn Hack in 4-5 weeks to discuss your echo results.  We will call you results of lab.  For head cold symptoms, use claritin 10 mg daily over the counter and musinex 600 mg twice a day over the counter.

## 2013-10-06 NOTE — Assessment & Plan Note (Addendum)
Different from 1 year ago, now racing and at times stops briefly.  Will check labs and 48 hour monitor.  She will then follow up with Dr. Ellyn Hack.  She admits that this may be anxiety, she has been having issues, but she is not sure.  What she describes does sound like PVCs or PACs.  Once monitor complete will increase bystolic to 7.5 daily
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
## 2013-10-07 LAB — TSH: TSH: 2.071 u[IU]/mL (ref 0.350–4.500)

## 2013-10-07 MED ORDER — NEBIVOLOL HCL 5 MG PO TABS: 7.5000 mg | ORAL_TABLET | Freq: Every day | ORAL | Status: DC

## 2013-10-07 NOTE — Assessment & Plan Note (Signed)
Increased anxiety recently.  Is treated.

## 2013-10-07 NOTE — Assessment & Plan Note (Signed)
Recent echo results called to pt -she would like to discuss with Dr. Ellyn Hack.  She will follow up after monitor.

## 2013-10-07 NOTE — Assessment & Plan Note (Signed)
See above

## 2013-10-13 ENCOUNTER — Ambulatory Visit (HOSPITAL_COMMUNITY): Payer: Self-pay | Admitting: Psychiatry

## 2013-10-13 ENCOUNTER — Encounter (HOSPITAL_COMMUNITY): Payer: Self-pay | Admitting: Psychiatry

## 2013-10-16 ENCOUNTER — Telehealth: Payer: Self-pay | Admitting: Internal Medicine

## 2013-10-16 NOTE — Telephone Encounter (Signed)
Faxed over medical records to [employer] on march 26 @ [phone]

## 2013-10-20 ENCOUNTER — Ambulatory Visit (HOSPITAL_COMMUNITY): Payer: Self-pay | Admitting: Psychiatry

## 2013-10-21 ENCOUNTER — Telehealth (HOSPITAL_COMMUNITY): Payer: Self-pay | Admitting: *Deleted

## 2013-10-21 NOTE — Telephone Encounter (Signed)
Called back, eft vm to call me

## 2013-10-29 ENCOUNTER — Telehealth (HOSPITAL_COMMUNITY): Payer: Self-pay | Admitting: *Deleted

## 2013-10-29 NOTE — Telephone Encounter (Signed)
done

## 2013-11-19 ENCOUNTER — Encounter: Payer: Self-pay | Admitting: *Deleted

## 2013-11-20 ENCOUNTER — Other Ambulatory Visit: Payer: Self-pay | Admitting: *Deleted

## 2013-11-20 DIAGNOSIS — R002 Palpitations: Secondary | ICD-10-CM

## 2013-11-20 DIAGNOSIS — E039 Hypothyroidism, unspecified: Secondary | ICD-10-CM

## 2013-11-20 DIAGNOSIS — I340 Nonrheumatic mitral (valve) insufficiency: Secondary | ICD-10-CM

## 2013-11-20 DIAGNOSIS — R5383 Other fatigue: Secondary | ICD-10-CM

## 2013-11-21 ENCOUNTER — Encounter: Payer: Self-pay | Admitting: Cardiology

## 2013-11-24 ENCOUNTER — Ambulatory Visit (INDEPENDENT_AMBULATORY_CARE_PROVIDER_SITE_OTHER): Payer: Managed Care, Other (non HMO) | Admitting: Cardiology

## 2013-11-24 VITALS — BP 111/73 | HR 81 | Ht 66.0 in | Wt 147.4 lb

## 2013-11-24 DIAGNOSIS — I340 Nonrheumatic mitral (valve) insufficiency: Secondary | ICD-10-CM

## 2013-11-24 DIAGNOSIS — I059 Rheumatic mitral valve disease, unspecified: Secondary | ICD-10-CM

## 2013-11-24 DIAGNOSIS — F339 Major depressive disorder, recurrent, unspecified: Secondary | ICD-10-CM

## 2013-11-24 DIAGNOSIS — R002 Palpitations: Secondary | ICD-10-CM

## 2013-11-24 DIAGNOSIS — R5381 Other malaise: Secondary | ICD-10-CM

## 2013-11-24 DIAGNOSIS — R5383 Other fatigue: Secondary | ICD-10-CM

## 2013-11-24 DIAGNOSIS — I341 Nonrheumatic mitral (valve) prolapse: Secondary | ICD-10-CM

## 2013-11-24 NOTE — Patient Instructions (Signed)
Your physician recommends that you schedule a follow-up appointment in: 6 months  Your physician has recommended you make the following change in your medication: 5 mg of Bystolic at bedtime

## 2013-11-30 ENCOUNTER — Encounter: Payer: Self-pay | Admitting: Cardiology

## 2013-11-30 DIAGNOSIS — R5383 Other fatigue: Secondary | ICD-10-CM | POA: Insufficient documentation

## 2013-11-30 NOTE — Assessment & Plan Note (Signed)
My impression is that most of her symptoms are related to depression. However given that she does have depression symptoms I am leery of her beta blocker. Bystolic is probably the best option for her, with lowest side effect profile. I do think that we can try to help her out by having her take it at nighttime to avoid potential soporific effect during the day.

## 2013-11-30 NOTE — Assessment & Plan Note (Signed)
Stable by echo over 2 years now.  EF remains stable. No heart failure symptoms.

## 2013-11-30 NOTE — Assessment & Plan Note (Signed)
With her symptoms of palpitations, there is a high index of suspicion for true arrhythmias in the setting of mitral prolapse, however no arrhythmia reveals on the monitor. Lisa Waters This we may be longer monitor even considering a possible loop implant. If she does have true documented syncope, would consider either longer external monitor or loop implant

## 2013-11-30 NOTE — Progress Notes (Signed)
PCP: Wyatt Haste, MD  Clinic Note: Chief Complaint  Patient presents with  . Follow-up    follow-up Echo and Monitor   HPI: Lisa Waters is a 33 y.o. female with a Cardiovascular Problem List below who presents today for followup after recent visit with Cecilie Kicks, NP-C. She was seen again on March for fatigue heart racing headaches feeling as though her heart starting and stopping. She now is returning for followup of her echocardiogram and cardiac event monitor. The monitor did not show any evidence of arrhythmias. Her echo was stable from last year.  Interval History: I actually last saw her in June of last year to followup for mitral prolapse/regurgitation. She's had a lot of issues with what sounds like depression and reactive airways disease over the past year. She is currently on a leave of absence from work. The main thing she notes is that she just feels tired all the time. She doesn't want to do anything. She has no energy and no drive. She denies any problems with actually going to sleep at night. She complains of a having episodes where she feels that her heart stops beating, and she feels like she may "blackout". This is his age these episodes last a few seconds. Unfortunately, when she had these sensations while wearing the monitor we did not find any arrhythmias. Otherwise she denies any exertional or resting dyspnea. No PND orthopnea. No chest tightness or pressure. Her dyspnea is even relatively stable. No melena hematochezia or hematuria.  Past Medical History  Diagnosis Date  . History of hodgkin's lymphoma 2007    in remission, sees [facility], Dr. Marcell Anger; prior chemotherapy and radiation therapy; sees yearly in December  . Hypothyroidism   . Depression   . Anxiety   . GERD (gastroesophageal reflux disease)   . MVP (mitral valve prolapse)     Dr. Ellyn Hack, [facility]; Moderate  MR; EF 60-65%  . Migraine     associated ocular symptoms, Dr. Lethea Killings, [facility]  .  Abnormal finding on chest xray 09/2012    COPD changes with right upper lobe scarring  . Palpitations 4/14    holter monitor, Dr. Ellyn Hack; no arrhythmia   . Moderate mitral regurgitation by prior echocardiogram 09/2012  . History of mammogram 12/2012    normal  . H/O amaurosis fugax Fall 2012    MRI of brain/brain stem: no acute abnormality, small area of encephalomalacia left superior cerebellum that could be prior trauma, infection, or lacunar infarct  . Reactive airway disease with wheezing     PFTs March 2014: mild obstructive defect, normal volumes, normal diffusion, +response to bronchodilator, FEV1 96% predicted, FEV1/FVC 69%; Pulmonology consult 4/14, thought to be related to non-selective beta blockers - she had these symptoms before beta blockers.  Pindolol was changed to Bystolic  . Asthma   . COPD (chronic obstructive pulmonary disease)     Prior Cardiovascular Evaluation / Surgical History: Procedure Laterality Date  . Transthoracic echocardiogram  09/30/2012; 09/15/2013    LVEF 60-65%, wall motion normal, LV function normal, moderate holosystolic MVP (medial the middle scallop of the posterior leaflet), moderate regurgitation (directed posteriorly), no shunt -- stable over 2 years  . Nm myocar perf wall motion  12/2010    persantine myoview - moderate breast attenuation (fixed mid-distal anterior defect), EF 68%, low risk scan   Cardiac Event Monitor: Mostly sinus rhythm/sinus arrhythmia with sinus tachycardia. No arrhythmias. No significant PACs or PVCs noted  MEDICATIONS AND ALLERGIES REVIEWED IN EPIC No  Change in Social and Family History  ROS: A comprehensive Review of Systems - Negative except Pertinent symptoms noted above.  PHYSICAL EXAM BP 111/73  Pulse 81  Ht 5\' 6"  (1.676 m)  Wt 147 lb 6.4 oz (66.86 kg)  BMI 23.80 kg/m2 General appearance: A&O  X 3, very tearful with depressed affect.  Not her usual smiling personality. Axis questions appropriately. In no acute  distress HEENT: [place]/at, EOMI, MMM, anicteric sclera  Neck: no adenopathy, no carotid bruit, no JVD and supple, symmetrical, trachea midline  Lungs: clear to auscultation bilaterally, normal percussion bilaterally and Nonlabored, no active wheezing, good air movement  Heart: normal apical impulse, regular rate and rhythm, S1, S2 normal, no S3 or S4, systolic murmur: holosystolic 3/6, blowing at lower left sternal border, at apex, radiates to axilla, no click and no rub  Abdomen: soft, non-tender; bowel sounds normal; no masses, no organomegaly  Extremities: extremities normal, atraumatic, no cyanosis or edema  Pulses: 2+ and symmetric  Neurologic: Grossly normal   No EKG or labs to review -- event monitor and echocardiogram reviewed above  ASSESSMENT / PLAN: Fatigue My impression is that most of her symptoms are related to depression. However given that she does have depression symptoms I am leery of her beta blocker. Bystolic is probably the best option for her, with lowest side effect profile. I do think that we can try to help her out by having her take it at nighttime to avoid potential soporific effect during the day.  Heart palpitations Her symptoms sounded like PACs or even possible arrhythmia. However, nothing was seen on monitor. He did not tolerate the higher dose of Bystolic. I think it may be too much. We may want to consider backing off to half a dose now in a good significant PACs and PVCs noted. If he truly syncopal episodes more than a 48 hour monitor.  Moderate mitral regurgitation by prior echocardiogram Stable by echo over 2 years now.  EF remains stable. No heart failure symptoms.  MVP (mitral valve prolapse) With her symptoms of palpitations, there is a high index of suspicion for true arrhythmias in the setting of mitral prolapse, however no arrhythmia reveals on the monitor. Almyra Free This we may be longer monitor even considering a possible loop implant. If she does have  true documented syncope, would consider either longer external monitor or loop implant    No orders of the defined types were placed in this encounter.   Meds ordered this encounter  Medications  . nebivolol (BYSTOLIC) 5 MG tablet    Sig: Take 5 mg by mouth daily.    Followup: 6 months  Jayquan Bradsher W. Ellyn Hack, M.D., M.S. Interventional Cardiologist [facility]

## 2013-11-30 NOTE — Assessment & Plan Note (Signed)
Her symptoms sounded like PACs or even possible arrhythmia. However, nothing was seen on monitor. He did not tolerate the higher dose of Bystolic. I think it may be too much. We may want to consider backing off to half a dose now in a good significant PACs and PVCs noted. If he truly syncopal episodes more than a 48 hour monitor.

## 2013-12-15 ENCOUNTER — Other Ambulatory Visit: Payer: Self-pay | Admitting: *Deleted

## 2013-12-15 MED ORDER — NEBIVOLOL HCL 5 MG PO TABS: 5.0000 mg | ORAL_TABLET | Freq: Every day | ORAL | Status: DC

## 2013-12-15 NOTE — Telephone Encounter (Signed)
Rx was sent to pharmacy electronically. 

## 2014-01-04 ENCOUNTER — Encounter: Payer: Self-pay | Admitting: Cardiology

## 2014-01-13 ENCOUNTER — Other Ambulatory Visit: Payer: Self-pay | Admitting: Medical

## 2014-01-28 DIAGNOSIS — Z029 Encounter for administrative examinations, unspecified: Secondary | ICD-10-CM

## 2014-02-02 ENCOUNTER — Encounter: Payer: Self-pay | Admitting: Family Medicine

## 2014-03-02 ENCOUNTER — Ambulatory Visit (INDEPENDENT_AMBULATORY_CARE_PROVIDER_SITE_OTHER): Payer: Managed Care, Other (non HMO) | Admitting: Family Medicine

## 2014-03-02 ENCOUNTER — Encounter: Payer: Self-pay | Admitting: Family Medicine

## 2014-03-02 ENCOUNTER — Other Ambulatory Visit: Payer: Self-pay | Admitting: Medical

## 2014-03-02 ENCOUNTER — Other Ambulatory Visit: Payer: Self-pay | Admitting: Family Medicine

## 2014-03-02 ENCOUNTER — Telehealth: Payer: Self-pay | Admitting: Family Medicine

## 2014-03-02 VITALS — BP 112/68 | HR 76 | Ht 67.0 in | Wt 145.0 lb

## 2014-03-02 DIAGNOSIS — R55 Syncope and collapse: Secondary | ICD-10-CM

## 2014-03-02 DIAGNOSIS — E039 Hypothyroidism, unspecified: Secondary | ICD-10-CM

## 2014-03-02 DIAGNOSIS — K120 Recurrent oral aphthae: Secondary | ICD-10-CM

## 2014-03-02 NOTE — Telephone Encounter (Signed)
Dr.lalonde is this okay 

## 2014-03-02 NOTE — Telephone Encounter (Signed)
Pt was here on 03/02/14 for acute, had labs for thyroid drawn today. She is currently on the schedule for 03/10/14 with you for reck thyroid, does she still need to keep that appointment?

## 2014-03-02 NOTE — Telephone Encounter (Signed)
Call out the synthroid, schedule f/u OV on synthroid, and don't refill the zoloft as she sees behavioral health for this.

## 2014-03-02 NOTE — Patient Instructions (Signed)
Make sure to drink plenty of fluids. Make sure that you are not skipping meals.  Eat frequently throughout the day.  Keep track of recurrent symptoms (journal, calendar). Follow-up with Dr. Redmond School if ongoing symptoms.  Continue gluten-free diet. Let us know if you desire further testing/eval.

## 2014-03-02 NOTE — Telephone Encounter (Signed)
Pt will be here today and i will let patient know

## 2014-03-02 NOTE — Progress Notes (Signed)
Chief Complaint  Patient presents with  . Palpitations    yesterday around lunchtime and she got sweaty, lightheaded and felt like her heart was racing. Husband gave her a spoonful of peanut butter and she did feel somewhat better. Happened another time about a week ago.    Yesterday she was outside, cleaning out her camper.  She came inside and sat down, felt fine, but then suddenly felt "unwell"--started sweating, heart racing, felt like she was going to pass out.  She was seated.  She used a wet towel and ate 2 spoonfuls of peanut butter and then she felt much better.  Felt bad for a total of 15 minutes.  This occurred around lunchtime--she had a cup of coffee, but nothing to eat all day prior to this episode.  She typically will go long periods with eating.  For example, it is currently 2:45 and she hasn't really eaten anything today.  She usually snacks throughout the day, but when she is in "go mode" she will forget and skip eating.  She had a similar episode about a week ago, shortly after waking up.  She felt better after drinking something (juice).  The episodes she had in April (for which she saw Dr. Ellyn Hack) were very different than the episode yesterday.  No associated chest pain or shortness of breath.  Hypothyroidism: Her last TSH was in March, done by NP at cardiologist's office.  Reports compliance with medication. She is scheduled next week for f/u and have TSH checked.  She had problems with recurrent canker sores in her mouth and esophagus.  She cut out gluten in her diet, and that has helped.  She is wondering if there are any tests to confirm her gluten reaction.  She had recurrence when she started adding gluten back into her diet.  She had biopsies (of ulcers) in the past, showing inflammation, but no diagnosis was made.  Past Medical History  Diagnosis Date  . History of hodgkin's lymphoma 2007    in remission, sees [facility], Dr. Marcell Anger; prior chemotherapy and radiation  therapy; sees yearly in December  . Hypothyroidism   . Depression   . Anxiety   . GERD (gastroesophageal reflux disease)   . MVP (mitral valve prolapse)     Dr. Ellyn Hack, [facility]; Moderate  MR; EF 60-65%  . Migraine     associated ocular symptoms, Dr. Lethea Killings, [facility]  . Abnormal finding on chest xray 09/2012    COPD changes with right upper lobe scarring  . Palpitations 4/14    holter monitor, Dr. Ellyn Hack; no arrhythmia   . Moderate mitral regurgitation by prior echocardiogram 09/2012  . History of mammogram 12/2012    normal  . H/O amaurosis fugax Fall 2012    MRI of brain/brain stem: no acute abnormality, small area of encephalomalacia left superior cerebellum that could be prior trauma, infection, or lacunar infarct  . Reactive airway disease with wheezing     PFTs March 2014: mild obstructive defect, normal volumes, normal diffusion, +response to bronchodilator, FEV1 96% predicted, FEV1/FVC 69%; Pulmonology consult 4/14, thought to be related to non-selective beta blockers - she had these symptoms before beta blockers.  Pindolol was changed to Bystolic  . Asthma   . COPD (chronic obstructive pulmonary disease)    Past Surgical History  Procedure Laterality Date  . Appendectomy  1993  . Breast enhancement surgery    . Rhinoplasty  2007    deviated septum  . Lymph node biopsy  2006  .  Transthoracic echocardiogram  09/30/2012; 09/15/2013    LVEF 60-65%, wall motion normal, LV function normal, moderate holosystolic MVP (medial the middle scallop of the posterior leaflet), moderate regurgitation (directed posteriorly), no shunt -- stable over 2 years  . Esophagogastroduodenoscopy  04/11/13    [facility] Dr. Collene Mares  . Insertion central venous access device w/ subcutaneous port  2006  . Nm myocar perf wall motion  12/2010    persantine myoview - moderate breast attenuation (fixed mid-distal anterior defect), EF 68%, low risk scan   History   Social History  .  Marital Status: Married    Spouse Name: N/A    Number of Children: 0  . Years of Education: N/A   Occupational History  . works in [employer]  . Smoking status: Former Smoker -- 1.00 packs/day for 10 years    Types: Cigarettes    Quit date: 11/07/2009  . Smokeless tobacco: Never Used  . Alcohol Use: No  . Drug Use: No  . Sexual Activity: Yes    Partners: Male    Birth Control/ Protection: None   Other Topics Concern  . 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 aspirin EC 81 MG tablet Take by mouth. 10/06/2013: Received from: [facility]  . famotidine (PEPCID) 20 MG tablet One at bedtime   . omeprazole (PRILOSEC) 40 MG capsule Take 1 capsule (40 mg total) by mouth daily.   . sertraline (ZOLOFT) 100 MG tablet Take 100 mg by mouth daily.   Marland Kitchen SYNTHROID 88 MCG tablet TAKE 1 TABLET DAILY   . [DISCONTINUED] levothyroxine (SYNTHROID, LEVOTHROID) 88 MCG tablet Take 88 mcg by mouth daily before breakfast.   . [DISCONTINUED] nebivolol (BYSTOLIC) 5 MG tablet Take 1 tablet (5 mg total) by mouth daily.   Marland Kitchen ibuprofen (ADVIL,MOTRIN) 800 MG tablet Take 1 tablet (800 mg total) by mouth every 8 (eight) hours as needed for pain. 03/02/2014: Doesn't take  . LORazepam (ATIVAN) 0.5 MG tablet Take 1 tablet (0.5 mg total) by mouth 2 (two) times daily. 03/02/2014: Uses twice daily as needed for anxiety--hasn't been needing recently.  Last taken about a month ago  . SUMAtriptan (IMITREX) 100 MG tablet TAKE 1 TABLET BY MOUTH EVERY 2 HOURS AS NEEDED FOR MIGRAINE. DO NOT EXCEED MORE THAN 2 TABLETS PER DAY. 03/02/2014: Needs it 2-3x/month prn migraine  . SUMAtriptan (IMITREX) 100 MG tablet TAKE 1 TABLET EVERY 2 HOURSAS NEEDED FOR MIGRAINE. NOTTO  EXCEED MORE THAN 2      TABLETS PER 24 HOURS   . [DISCONTINUED] escitalopram (LEXAPRO) 20 MG tablet Take 20 mg by mouth. 10/06/2013: Received from: [facility]  . [DISCONTINUED] gabapentin (NEURONTIN) 300 MG capsule Take 300 mg by mouth. 10/06/2013: Received from: [facility]  . [DISCONTINUED] hyoscyamine (LEVBID) 0.375 MG 12 hr tablet Take 1 tablet (0.375 mg total) by mouth every 12 (twelve) hours as needed for cramping.   . [DISCONTINUED] omeprazole (PRILOSEC) 40 MG capsule TAKE 1 CAPSULE DAILY   . [DISCONTINUED] sucralfate (CARAFATE) 1 GM/10ML suspension Take 28ml one hour before lunch and dinner and at bedtime.  Don't take in morning with synthroid.    Allergies  Allergen  Reactions  . Codeine Itching  . Fish Allergy     [place]  . Iohexol Hives  . Pindolol     Avoid non selective beta blockers due to dyspnea   ROS:  Denies fevers, chills, URI symptoms, chest pain.  Denies vomiting, diarrhea, abdominal pain, urinary complaints.  Menses are heavy, regular. No bleeding/bruising, rash, hair/skin/energy changes.  See HPI.  PHYSICAL EXAM: Blood pressure 112/68, pulse 76, height 5\' 7"  (1.702 m), weight 145 lb (65.772 kg), last menstrual period 02/23/2014. Well developed, pleasant female in no distress HEENT:  PERRL, conjunctiva clear.  OP clear, moist mucus membranes. Neck: no lymphadenopathy, thyromegaly or mass Heart: regular rate and rhythm, +murmur noted Lungs: clear bilaterally Abdomen: soft, nontender, no organomegaly or mass Extremities: no edema, 2+ pulses Skin: no rashes.  Sleeve tattoo on RUE Neuro: alert and oriented.  Cranial nerve intact.  Normal strength, sensation ,gait, DTR's  Lab Results  Component Value Date   TSH 2.071 10/06/2013     Chemistry      Component Value Date/Time   NA 139 10/06/2013 1107   K 4.6 10/06/2013 1107   CL 100 10/06/2013 1107   CO2 28 10/06/2013 1107   BUN 9 10/06/2013 1107   CREATININE 0.76  10/06/2013 1107   CREATININE 0.82 05/26/2013 1655      Component Value Date/Time   CALCIUM 9.7 10/06/2013 1107   ALKPHOS 47 05/26/2013 1655   AST 20 05/26/2013 1655   ALT 9 05/26/2013 1655   BILITOT 0.5 05/26/2013 1655     ASSESSMENT/PLAN:  Syncope, near - suspect hypoglycemia. +/- dehydration contributing.  doubt cardiac etiology, recent normal workup. Reviewed dietary recommendations in detail.  f/u if persists - Plan: CBC with Differential, Glucose, random  Unspecified hypothyroidism - euthyroid by history.  repeat TSH today.  sample of 7d 71mcg Synthroid given.  Will need 90 day refill (after labs back) - Plan: TSH  Recurrent canker sores - pt feels these are related to intake of gluten, improved when gluten-free.  continue gluten-free diet  Glucose, TSH, CBC Samples of Synthroid 88 until results back, and then refill if dose is appropriate (I suspect it will be normal).   Discussed gluten intolerance/sensitivity vs allergy/celiac disease. Continue gluten-free diet.  Discussed that testing either requires GI eval/biopsy or going back on gluten in diet in order to do blood testing.

## 2014-03-02 NOTE — Telephone Encounter (Signed)
Is this okay to refill? 

## 2014-03-03 LAB — CBC WITH DIFFERENTIAL/PLATELET
BASOS PCT: 1 % (ref 0–1)
Basophils Absolute: 0.1 10*3/uL (ref 0.0–0.1)
Eosinophils Absolute: 0.3 10*3/uL (ref 0.0–0.7)
Eosinophils Relative: 5 % (ref 0–5)
HCT: 37.1 % (ref 36.0–46.0)
Hemoglobin: 12.7 g/dL (ref 12.0–15.0)
LYMPHS PCT: 33 % (ref 12–46)
Lymphs Abs: 2 10*3/uL (ref 0.7–4.0)
MCH: 30.5 pg (ref 26.0–34.0)
MCHC: 34.2 g/dL (ref 30.0–36.0)
MCV: 89.2 fL (ref 78.0–100.0)
MONOS PCT: 8 % (ref 3–12)
Monocytes Absolute: 0.5 10*3/uL (ref 0.1–1.0)
NEUTROS ABS: 3.2 10*3/uL (ref 1.7–7.7)
Neutrophils Relative %: 53 % (ref 43–77)
Platelets: 285 10*3/uL (ref 150–400)
RBC: 4.16 MIL/uL (ref 3.87–5.11)
RDW: 13.4 % (ref 11.5–15.5)
WBC: 6 10*3/uL (ref 4.0–10.5)

## 2014-03-03 LAB — GLUCOSE, RANDOM: GLUCOSE: 91 mg/dL (ref 70–99)

## 2014-03-03 LAB — TSH: TSH: 3.005 u[IU]/mL (ref 0.350–4.500)

## 2014-03-03 MED ORDER — SYNTHROID 88 MCG PO TABS: ORAL_TABLET | ORAL | Status: DC

## 2014-03-03 NOTE — Addendum Note (Signed)
Addended byRita Ohara on: 03/03/2014 10:32 AM   Modules accepted: Orders

## 2014-03-10 ENCOUNTER — Ambulatory Visit: Payer: Managed Care, Other (non HMO) | Admitting: Family Medicine

## 2014-03-27 ENCOUNTER — Encounter: Payer: Self-pay | Admitting: Medical

## 2014-03-27 ENCOUNTER — Telehealth: Payer: Self-pay | Admitting: Family Medicine

## 2014-03-27 NOTE — Telephone Encounter (Signed)
Pt was sent a no show letter/call back list was checked.

## 2014-04-17 ENCOUNTER — Ambulatory Visit (INDEPENDENT_AMBULATORY_CARE_PROVIDER_SITE_OTHER): Payer: Managed Care, Other (non HMO) | Admitting: Medical

## 2014-04-17 ENCOUNTER — Encounter: Payer: Self-pay | Admitting: Medical

## 2014-04-17 VITALS — BP 100/78 | HR 88 | Resp 16 | Wt 148.0 lb

## 2014-04-17 DIAGNOSIS — K2210 Ulcer of esophagus without bleeding: Secondary | ICD-10-CM

## 2014-04-17 DIAGNOSIS — R140 Abdominal distension (gaseous): Secondary | ICD-10-CM

## 2014-04-17 DIAGNOSIS — K1379 Other lesions of oral mucosa: Secondary | ICD-10-CM

## 2014-04-17 LAB — FOLATE: FOLATE: 20 ng/mL

## 2014-04-17 LAB — VITAMIN B12: Vitamin B-12: 649 pg/mL (ref 211–911)

## 2014-04-17 MED ORDER — HYDROCODONE-ACETAMINOPHEN 7.5-325 MG PO TABS: 1.0000 | ORAL_TABLET | Freq: Four times a day (QID) | ORAL | Status: DC | PRN

## 2014-04-17 NOTE — Progress Notes (Signed)
Subjective: Here for mouth ulcerations again.  I have seen her prior for this.   Her husband brought her in today.  She is tearful and in pain.   She notes having recurrent mouth ulcers x 4 years.   The last episode a few months ago was the worst she has had lasting 3 months.   She had read that a gluten allergy could be a cause, and during the last bout, she changed to [employer] and the ulcers cleared up quickly.  She thinks she may have gluten allergy.   She had been staying away from gluten until her mother made a soup recently that contained gluten before she knew it.  She started getting ulcers in upper and lower inside of mouth, very painful which prompted the visit today.   She has a variety of medications at home she is using including magic mouthwash, kenalog topical and other rinses.  She used some of her husband's pain medications, but he can't go without his medication any longer, needs her on prescription to help the pain.  She wants to get to the bottom of the cause.   She had endoscopy with Dr. Collene Mares 2014, had esophageal ulcers.   Saw ulcer specialist at [facility], had lip biopsy that was inconclusive.   She wants to be tested for food allergy and gluten allergy.   No other aggravating or relieving factors.  No other c/o.  Review of Systems Constitutional: -fever, -chills, -sweats, -unexpected weight change ENT: -runny nose, -ear pain, -sore throat Cardiology:  -chest pain, -palpitations, -edema Respiratory: -cough, -shortness of breath, -wheezing Gastroenterology: +abdominal pain, +abdominal bloating, +mucous in the stool, -blood in stool, -nausea, -vomiting, -diarrhea, -constipation  Urology: -dysuria, -difficulty urinating, -hematuria, -urinary frequency, -urgency Neurology: -headache, -weakness, -tingling, -numbness    Objective: Filed Vitals:   04/17/14 1016  BP: 100/78  Pulse: 88  Resp: 16    General appearance: alert, no distress, WD/WN, crying, in pain HEENT:  normocephalic, sclerae anicteric, TMs pearly, nares patent, no discharge or erythema, pharynx normal Oral cavity: MMM, several small but painful 3-4 mm diameter whitish ulcers inside upper and lower gums, right buccal mucosa, lips seem mildly inflamed Neck: supple, no lymphadenopathy, no thyromegaly, no masses Abdomen: +increased bs, soft, non tender, non distended, no masses, no hepatomegaly, no splenomegaly Pulses: 2+ symmetric Skin: unremarkable     Assessment: Encounter Diagnoses  Name Primary?  . Recurrent mouth ulceration Yes  . Abdominal bloating   . Esophageal ulcer    Plan: Labs today as below Orders Placed This Encounter  Procedures  . Celiac Panel  . Vitamin B12  . Vitamin D, 25-hydroxy  . Allergen food profile specific IgE  . Sedimentation rate  . Folate   Discussed her concerns.  She will c/t the medications at home for topical relief, begin hydrocodone prn for pain.   Pending labs, may need to revisit with Dr. Hoyt Koch or ulcer specialist at [facility].  She will c/t [place] for now, avoidance of gluten.

## 2014-04-18 LAB — VITAMIN D 25 HYDROXY (VIT D DEFICIENCY, FRACTURES): Vit D, 25-Hydroxy: 40 ng/mL (ref 30–89)

## 2014-04-18 LAB — SEDIMENTATION RATE: Sed Rate: 1 mm/hr (ref 0–22)

## 2014-04-20 ENCOUNTER — Other Ambulatory Visit: Payer: Self-pay | Admitting: Medical

## 2014-04-20 ENCOUNTER — Telehealth: Payer: Self-pay | Admitting: Family Medicine

## 2014-04-20 LAB — ALLERGEN FOOD PROFILE SPECIFIC IGE
Apple: <0.1 kU/L
Chicken IgE: <0.1 kU/L
Corn: <0.1 kU/L
IGE (IMMUNOGLOBULIN E), SERUM: 11 kU/L (ref <115)
Shrimp IgE: <0.1 kU/L
Tomato IgE: <0.1 kU/L
Wheat IgE: <0.1 kU/L

## 2014-04-20 LAB — GLIA (IGA/G) + TTG IGA
GLIADIN IGA: 6.9 U/mL (ref <20)
GLIADIN IGG: 7.5 U/mL (ref <20)
TISSUE TRANSGLUTAMINASE AB, IGA: 8.5 U/mL (ref <20)

## 2014-04-20 MED ORDER — METHOCARBAMOL 500 MG PO TABS: 500.0000 mg | ORAL_TABLET | Freq: Three times a day (TID) | ORAL | Status: DC | PRN

## 2014-04-20 NOTE — Telephone Encounter (Signed)
Patient is talking about about Robaxin.

## 2014-04-20 NOTE — Telephone Encounter (Signed)
Pt called and req refill for meds for her esophageal spasms to [place]    Pt Ph [phone]

## 2014-04-20 NOTE — Telephone Encounter (Signed)
Which medication is she referring to? 

## 2014-05-26 ENCOUNTER — Other Ambulatory Visit: Payer: Self-pay | Admitting: Orthopedic Surgery

## 2014-05-26 DIAGNOSIS — M25512 Pain in left shoulder: Secondary | ICD-10-CM | POA: Insufficient documentation

## 2014-05-26 DIAGNOSIS — R52 Pain, unspecified: Secondary | ICD-10-CM

## 2014-05-26 DIAGNOSIS — M2560 Stiffness of unspecified joint, not elsewhere classified: Secondary | ICD-10-CM

## 2014-06-05 ENCOUNTER — Ambulatory Visit
Admission: RE | Admit: 2014-06-05 | Discharge: 2014-06-05 | Disposition: A | Discharge status: Home / Self Care | Payer: Managed Care, Other (non HMO) | Source: Ambulatory Visit | Attending: Orthopedic Surgery | Admitting: Orthopedic Surgery

## 2014-06-05 DIAGNOSIS — M2560 Stiffness of unspecified joint, not elsewhere classified: Secondary | ICD-10-CM

## 2014-06-05 DIAGNOSIS — R52 Pain, unspecified: Secondary | ICD-10-CM

## 2014-06-05 MED ORDER — IOHEXOL 180 MG/ML  SOLN
20.0000 mL | Freq: Once | INTRAMUSCULAR | Status: AC | PRN
  Administered 2014-06-05: 20 mL via INTRA_ARTICULAR

## 2014-06-09 DIAGNOSIS — M7542 Impingement syndrome of left shoulder: Secondary | ICD-10-CM | POA: Insufficient documentation

## 2014-06-17 ENCOUNTER — Encounter (HOSPITAL_COMMUNITY): Payer: Self-pay | Admitting: Psychiatry

## 2014-06-17 ENCOUNTER — Ambulatory Visit (INDEPENDENT_AMBULATORY_CARE_PROVIDER_SITE_OTHER): Payer: Managed Care, Other (non HMO) | Admitting: Psychiatry

## 2014-06-17 VITALS — BP 113/75 | HR 98 | Ht 67.0 in | Wt 146.6 lb

## 2014-06-17 DIAGNOSIS — F1010 Alcohol abuse, uncomplicated: Secondary | ICD-10-CM

## 2014-06-17 DIAGNOSIS — F411 Generalized anxiety disorder: Secondary | ICD-10-CM

## 2014-06-17 DIAGNOSIS — F331 Major depressive disorder, recurrent, moderate: Secondary | ICD-10-CM

## 2014-06-17 DIAGNOSIS — F329 Major depressive disorder, single episode, unspecified: Secondary | ICD-10-CM

## 2014-06-17 MED ORDER — VILAZODONE HCL 40 MG PO TABS: 40.0000 mg | ORAL_TABLET | Freq: Every day | ORAL | Status: DC
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
## 2014-06-25 ENCOUNTER — Ambulatory Visit (INDEPENDENT_AMBULATORY_CARE_PROVIDER_SITE_OTHER): Payer: Managed Care, Other (non HMO) | Admitting: Psychiatry

## 2014-06-25 DIAGNOSIS — F411 Generalized anxiety disorder: Secondary | ICD-10-CM

## 2014-06-25 NOTE — Patient Instructions (Signed)
Discussed orally 
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
## 2014-07-17 HISTORY — PX: COLONOSCOPY: SHX174

## 2014-07-22 ENCOUNTER — Encounter (HOSPITAL_COMMUNITY): Payer: Self-pay | Admitting: Psychiatry

## 2014-07-22 ENCOUNTER — Ambulatory Visit (INDEPENDENT_AMBULATORY_CARE_PROVIDER_SITE_OTHER): Payer: Managed Care, Other (non HMO) | Admitting: Psychiatry

## 2014-07-22 VITALS — BP 100/72 | Ht 67.0 in | Wt 145.0 lb

## 2014-07-22 DIAGNOSIS — F1010 Alcohol abuse, uncomplicated: Secondary | ICD-10-CM

## 2014-07-22 DIAGNOSIS — F411 Generalized anxiety disorder: Secondary | ICD-10-CM

## 2014-07-22 DIAGNOSIS — F331 Major depressive disorder, recurrent, moderate: Secondary | ICD-10-CM

## 2014-07-22 DIAGNOSIS — F329 Major depressive disorder, single episode, unspecified: Secondary | ICD-10-CM

## 2014-07-22 MED ORDER — CLONAZEPAM 0.5 MG PO TABS: 0.5000 mg | ORAL_TABLET | Freq: Two times a day (BID) | ORAL | Status: DC | PRN

## 2014-07-22 MED ORDER — VILAZODONE HCL 20 MG PO TABS: 20.0000 mg | ORAL_TABLET | Freq: Every day | ORAL | Status: DC
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
## 2014-07-24 ENCOUNTER — Ambulatory Visit (HOSPITAL_COMMUNITY): Payer: Self-pay | Admitting: Psychiatry

## 2014-07-27 DIAGNOSIS — M7550 Bursitis of unspecified shoulder: Secondary | ICD-10-CM | POA: Insufficient documentation

## 2014-07-27 DIAGNOSIS — M7522 Bicipital tendinitis, left shoulder: Secondary | ICD-10-CM | POA: Insufficient documentation

## 2014-07-30 ENCOUNTER — Encounter: Payer: Self-pay | Admitting: Cardiology

## 2014-07-30 ENCOUNTER — Ambulatory Visit (INDEPENDENT_AMBULATORY_CARE_PROVIDER_SITE_OTHER): Payer: Managed Care, Other (non HMO) | Admitting: Cardiology

## 2014-07-30 VITALS — BP 118/76 | HR 79 | Ht 66.0 in | Wt 144.6 lb

## 2014-07-30 DIAGNOSIS — I341 Nonrheumatic mitral (valve) prolapse: Secondary | ICD-10-CM

## 2014-07-30 DIAGNOSIS — R079 Chest pain, unspecified: Secondary | ICD-10-CM

## 2014-07-30 DIAGNOSIS — I340 Nonrheumatic mitral (valve) insufficiency: Secondary | ICD-10-CM

## 2014-07-30 DIAGNOSIS — R002 Palpitations: Secondary | ICD-10-CM

## 2014-07-30 NOTE — Patient Instructions (Addendum)
When having episodes of palpitations take Bystolic 5mg  for two days then 2.5mg  for 3 days, also when taking the Bystolic take your Kalonopin at the same time.   Your physician wants you to follow-up in: 6 months with Dr.Harding.  You will receive a reminder letter in the mail two months in advance. If you don't receive a letter, please call our office to schedule the follow-up appointment.

## 2014-07-31 LAB — HM COLONOSCOPY

## 2014-08-03 ENCOUNTER — Encounter: Payer: Self-pay | Admitting: Cardiology

## 2014-08-03 DIAGNOSIS — R079 Chest pain, unspecified: Secondary | ICD-10-CM | POA: Insufficient documentation

## 2014-08-03 NOTE — Assessment & Plan Note (Signed)
She had auditory and done here at in March of last year that showed moderate MR, then a stress echo it did show a mild to moderate MR made worse to moderate MR with exertion. As far as timing of her surgery goes, I would not pursue surgery until her left eye jugular size is enlarged and or EF is reduced based on guidelines criteria. I am leery of being too aggressive with afterload reduction which would be the mainstay of treatment, simply because of her dizziness. I didn't want to have the beta blocker on board and she was tolerating the bystolic relatively well. Somehow that was stopped. At present, we may just consider as needed bystolic or palpitations but not as a staining medication

## 2014-08-03 NOTE — Progress Notes (Signed)
PCP: Wyatt Haste, MD  Clinic Note: Chief Complaint  Patient presents with  . Follow-up    chest pain often over past month along with dyspnea   HPI: Lisa Waters is a 34 y.o. female with a PMH below who presents today for six-month followup of her mitral valve prolapse with moderate mitral regurgitation (diagnosed in Spring of 2012). She does have diffuse episodes of chest discomfort as well as what sounds like most like anxiety attacks. I last saw her in May of 2015 when she was having episodes concerning for "blackout spells". We never did see any abnormalities on monitor. We did start her on low-dose beta blocker in the hope that this would help her palpitations. This was stopped somewhere along the line. Potentially related to work this having symptoms.  Somehow back in June of 2015 she will underwent a treadmill echo at [facility] ([place]) to evaluate her mitral valve. This echo was read showing mild-moderate mitral regurgitation with normal EF at rest the with exertion she did note dyspnea but was able to walk 7 minutes and 22 seconds reaching 10.10 METS.  She achieved a heart rate of 181 beats per minute. There is no wall motion abnormality is noted. We've exertion she did have moderate mitral regurgitation. She did not have chest discomfort.  Past Medical History  Diagnosis Date  . History of hodgkin's lymphoma 2007    in remission, sees [facility], Dr. Marcell Anger; prior chemotherapy and radiation therapy; sees yearly in December  . Hypothyroidism   . Depression   . Anxiety   . GERD (gastroesophageal reflux disease)   . MVP (mitral valve prolapse) March 2012    Dr. Ellyn Hack, [facility]; a) Echo 3/'12: Mod MR; EF 60-65%; b) TEE 5/'12: Nl LV Fxn, EF 60-65%, mild, holosystolic prolapse of the medial anterior leaflet. Mod MR . c) Echo 3/'15: Moderate, holosystolic prolapse of  medial. Mod MR d) TM Stress Echo (@ [facility]) Nl Lv Fxn, Mild-Mod MR @ Res0 --> Mod MR at HR 181 bpm (7:22 min, 10.10 METS) w/p WMA   . Moderate mitral regurgitation by prior echocardiogram 09/2010    See above  . Palpitations 4/14    holter monitor, Dr. Ellyn Hack; no arrhythmia   . History of mammogram 12/2012    normal  . H/O amaurosis fugax Fall 2012    MRI of brain/brain stem: no acute abnormality, small area of encephalomalacia left superior cerebellum that could be prior trauma, infection, or lacunar infarct  . Reactive airway disease with wheezing     PFTs March 2014: mild obstructive defect, normal volumes, normal diffusion, +response to bronchodilator, FEV1 96% predicted, FEV1/FVC 69%; Pulmonology consult 4/14, thought to be related to non-selective beta blockers - she had these symptoms before beta blockers.  Pindolol was changed to Bystolic  . Asthma   . COPD (chronic obstructive pulmonary disease)     COPD changes with right upper lobe scarring; vs. post radiation changes  . Migraine     associated ocular symptoms, Dr. Lethea Killings, [facility]    Prior Cardiac Evaluation and Past Surgical History: Past Surgical History  Procedure Laterality Date  . Appendectomy  1993  . Breast enhancement surgery    . Rhinoplasty  2007    deviated septum  . Lymph node biopsy  2006  . Transthoracic echocardiogram  09/30/2012; 09/15/2013    LVEF 60-65%, wall motion normal, LV function normal, moderate holosystolic MVP (medial the middle scallop of the posterior leaflet), moderate regurgitation (directed posteriorly), no shunt --  stable over 2 years  . Esophagogastroduodenoscopy  04/11/13    [facility] Dr. Collene Mares  . Insertion central venous access device w/ subcutaneous port  2006  . Nm myocar perf wall motion  12/2010    persantine myoview - moderate breast attenuation (fixed mid-distal anterior defect), EF 68%, low risk scan  . Tee without cardioversion  11/2010    Nl LV Fxn, EF 60-65%, mild, holosystolic prolapse of the medial anterior leaflet. Mod MR .    Interval History: She comes in today much less  tearful than usual. She is noticing little twinges in her chest intermittently off and on for the last month or so. She's been noticing it quite frequently but not also stated that a particular activity. Strangely, overlies, bases not have any. She has a baseline dyspnea that is worse with exertion, is not having significant wheezing issues recently. The chest discomfort she does note is not at all associated with exertion. She denies any heart failure symptoms of PND, orthopnea or edema. Palpitations have improved, despite being off of her beta blocker. No recurrent TIA/hours today symptoms. Apparently these were considered to be related to migraines. No syncope/near syncope.  ROS: A comprehensive was performed. Review of Systems  Constitutional: Positive for malaise/fatigue (Chronic).  HENT: Negative for nosebleeds.   Respiratory: Positive for wheezing (Intermittent).   Cardiovascular: Positive for chest pain and palpitations.  Gastrointestinal: Positive for diarrhea and blood in stool (Has had bloody stools, bloody diarrhea. Scheduled to see GI). Negative for heartburn and constipation.  Genitourinary: Negative for dysuria and hematuria.  Musculoskeletal: Positive for myalgias and joint pain.  Neurological: Positive for dizziness.  Psychiatric/Behavioral: Positive for depression. The patient is nervous/anxious.   All other systems reviewed and are negative.   Current Outpatient Prescriptions on File Prior to Visit  Medication Sig Dispense Refill  . aspirin EC 81 MG tablet Take by mouth.    . clonazePAM (KLONOPIN) 0.5 MG tablet Take 1 tablet (0.5 mg total) by mouth 2 (two) times daily as needed for anxiety. 60 tablet 1  . famotidine (PEPCID) 20 MG tablet One at bedtime 30 tablet 11  . HYDROcodone-acetaminophen (NORCO) 7.5-325 MG per tablet Take 1 tablet by mouth every 6 (six) hours as needed for moderate pain. 20 tablet 0  . ibuprofen (ADVIL,MOTRIN) 800 MG tablet Take 1 tablet (800 mg  total) by mouth every 8 (eight) hours as needed for pain. 30 tablet 0  . methocarbamol (ROBAXIN) 500 MG tablet Take 1 tablet (500 mg total) by mouth every 8 (eight) hours as needed for muscle spasms. 20 tablet 0  . omeprazole (PRILOSEC) 40 MG capsule Take 1 capsule (40 mg total) by mouth daily. 90 capsule 0  . SUMAtriptan (IMITREX) 100 MG tablet TAKE 1 TABLET BY MOUTH EVERY 2 HOURS AS NEEDED FOR MIGRAINE. DO NOT EXCEED MORE THAN 2 TABLETS PER DAY. 10 tablet 5  . SYNTHROID 88 MCG tablet TAKE 1 TABLET DAILY 90 tablet 1  . Vilazodone HCl 20 MG TABS Take 1 tablet (20 mg total) by mouth daily. 30 tablet 2   No current facility-administered medications on file prior to visit.   Allergies  Allergen Reactions  . Codeine Itching  . Fish Allergy     [place]  . Iohexol Hives  . Pindolol     Avoid non selective beta blockers due to dyspnea  . Iodinated Diagnostic Agents Rash    Has received since rash developed without pre-meds without development of rash.  SOCIAL AND FAMILY HISTORY REVIEWED IN EPIC -- No notable change; she doesn't have essentially given up trying to "get pregnant "period of days she initially thought about trying adoption, but that did not pan out. She thinks still on disability now for anxiety disorder.  Wt Readings from Last 3 Encounters:  07/30/14 144 lb 9.6 oz (65.59 kg)  07/22/14 145 lb (65.772 kg)  06/17/14 146 lb 9.6 oz (66.497 kg)    PHYSICAL EXAM BP 118/76 mmHg  Pulse 79  Ht 5\' 6"  (1.676 m)  Wt 144 lb 9.6 oz (65.59 kg)  BMI 23.35 kg/m2 General appearance: A&O X 3, very tearful with depressed affect. Not her usual smiling personality. Axis questions appropriately. In no acute distress HEENT: [place]/at, EOMI, MMM, anicteric sclera  Neck: no adenopathy, no carotid bruit, no JVD and supple, symmetrical, trachea midline  Lungs: clear to auscultation bilaterally, normal percussion bilaterally and Nonlabored, no active wheezing, good air movement  Heart: normal apical  impulse, regular rate and rhythm, S1, S2 normal, no S3 or S4, systolic murmur: holosystolic 3/6, blowing at lower left sternal border, at apex, radiates to axilla, no click and no rub  Abdomen: soft, non-tender; bowel sounds normal; no masses, no organomegaly  Extremities: extremities normal, atraumatic, no cyanosis or edema  Pulses: 2+ and symmetric  Neurologic: Grossly normal    Adult ECG Report  Rate: 79 ;  Rhythm: normal sinus rhythm and Lobe infiltrate. Otherwise normal EKG  Recent Labs:  No new labs   ASSESSMENT / PLAN: Chest pain with low risk of acute coronary syndrome I am not sure what to make of the symptoms that she is describing. She's had ischemic evaluation spoke with Myoview and now a treadmill stress echo that were negative the last couple years and she is quite young fleeting little episodes of discomfort are probably palpation related or musculoskeletal.  The symptoms are not exacerbated by exertion, therefore not inclined to pursue more stress testing.   Moderate mitral regurgitation by prior echocardiogram She had auditory and done here at in March of last year that showed moderate MR, then a stress echo it did show a mild to moderate MR made worse to moderate MR with exertion. As far as timing of her surgery goes, I would not pursue surgery until her left eye jugular size is enlarged and or EF is reduced based on guidelines criteria. I am leery of being too aggressive with afterload reduction which would be the mainstay of treatment, simply because of her dizziness. I didn't want to have the beta blocker on board and she was tolerating the bystolic relatively well. Somehow that was stopped. At present, we may just consider as needed bystolic or palpitations but not as a staining medication   MVP (mitral valve prolapse) With moderate MR and mitral prolapse. Would not be unreasonable to consider antibiotic prophylaxis for GI and dental procedures   Heart  palpitations She has these episodes they come in spells. Reticulocyte we can do is use a when necessary concept for her beta blocker. Pt Instructions: When having episodes of palpitations take Bystolic 5mg  for two days then 2.5mg  for 3 days, also when taking the Bystolic take your Kalonopin at the same time    No orders of the defined types were placed in this encounter.   Meds ordered this encounter  Medications  . gabapentin (NEURONTIN) 300 MG capsule    Sig: Take 300 mg by mouth 3 (three) times daily.  . colchicine 0.6 MG tablet  Sig: Take 0.6 mg by mouth daily as needed.    Refill:  5     Followup: 6 months   Filomena Pokorney, Leonie Green, M.D., M.S. Interventional Cardiologist   Pager # [phone]

## 2014-08-03 NOTE — Assessment & Plan Note (Signed)
I am not sure what to make of the symptoms that she is describing. She's had ischemic evaluation spoke with Myoview and now a treadmill stress echo that were negative the last couple years and she is quite young fleeting little episodes of discomfort are probably palpation related or musculoskeletal.  The symptoms are not exacerbated by exertion, therefore not inclined to pursue more stress testing.

## 2014-08-03 NOTE — Assessment & Plan Note (Signed)
She has these episodes they come in spells. Reticulocyte we can do is use a when necessary concept for her beta blocker. Pt Instructions: When having episodes of palpitations take Bystolic 5mg  for two days then 2.5mg  for 3 days, also when taking the Bystolic take your Kalonopin at the same time

## 2014-08-03 NOTE — Assessment & Plan Note (Signed)
With moderate MR and mitral prolapse. Would not be unreasonable to consider antibiotic prophylaxis for GI and dental procedures

## 2014-08-04 ENCOUNTER — Telehealth (HOSPITAL_COMMUNITY): Payer: Self-pay | Admitting: *Deleted

## 2014-08-04 ENCOUNTER — Ambulatory Visit (INDEPENDENT_AMBULATORY_CARE_PROVIDER_SITE_OTHER): Payer: Managed Care, Other (non HMO) | Admitting: Psychiatry

## 2014-08-04 DIAGNOSIS — F411 Generalized anxiety disorder: Secondary | ICD-10-CM
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
## 2014-08-04 NOTE — Telephone Encounter (Signed)
lmtcb due to resch her appt for 08-19-14. number provided

## 2014-08-04 NOTE — Patient Instructions (Signed)
Discussed orally 

## 2014-08-10 ENCOUNTER — Telehealth (HOSPITAL_COMMUNITY): Payer: Self-pay | Admitting: *Deleted

## 2014-08-18 ENCOUNTER — Ambulatory Visit (INDEPENDENT_AMBULATORY_CARE_PROVIDER_SITE_OTHER): Payer: Managed Care, Other (non HMO) | Admitting: Family Medicine

## 2014-08-18 VITALS — BP 118/70 | HR 86 | Wt 143.0 lb

## 2014-08-18 DIAGNOSIS — J040 Acute laryngitis: Secondary | ICD-10-CM

## 2014-08-18 LAB — POCT RAPID STREP A (OFFICE): Rapid Strep A Screen: NEGATIVE

## 2014-08-18 NOTE — Progress Notes (Signed)
   Subjective:    Patient ID: Lisa Waters, female    DOB: 1980/12/29, 34 y.o.   MRN: [id number]  HPI She complains of a 4 day history of fatigue, hoarse voice and slight nausea but no fever, chills, earache, cough or congestion. She thinks it could be her thyroid.   Review of Systems     Objective:   Physical Exam alert and in no distress. Tympanic membranes and canals are normal. Throat is clear. Tonsils are normal. Neck is supple without adenopathy or thyromegaly. Cardiac exam shows a regular sinus rhythm without murmurs or gallops. Lungs are clear to auscultation. Review of her lab data shows her last thyroid panel in August was good. Strep screen is negative       Assessment & Plan:  Laryngitis - Plan: Rapid Strep A  recommend supportive care.

## 2014-08-19 ENCOUNTER — Ambulatory Visit (HOSPITAL_COMMUNITY): Payer: Self-pay | Admitting: Psychiatry

## 2014-09-10 ENCOUNTER — Encounter (HOSPITAL_COMMUNITY): Payer: Self-pay | Admitting: Psychiatry

## 2014-09-10 ENCOUNTER — Ambulatory Visit (INDEPENDENT_AMBULATORY_CARE_PROVIDER_SITE_OTHER): Payer: Managed Care, Other (non HMO) | Admitting: Psychiatry

## 2014-09-10 VITALS — BP 119/70 | HR 85 | Ht 67.0 in | Wt 146.0 lb

## 2014-09-10 DIAGNOSIS — F1010 Alcohol abuse, uncomplicated: Secondary | ICD-10-CM

## 2014-09-10 DIAGNOSIS — F329 Major depressive disorder, single episode, unspecified: Secondary | ICD-10-CM

## 2014-09-10 DIAGNOSIS — F411 Generalized anxiety disorder: Secondary | ICD-10-CM

## 2014-09-10 MED ORDER — VILAZODONE HCL 20 MG PO TABS: 20.0000 mg | ORAL_TABLET | Freq: Every day | ORAL | Status: DC
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
## 2014-09-21 ENCOUNTER — Ambulatory Visit (INDEPENDENT_AMBULATORY_CARE_PROVIDER_SITE_OTHER): Payer: Managed Care, Other (non HMO) | Admitting: Psychiatry

## 2014-09-21 DIAGNOSIS — F411 Generalized anxiety disorder: Secondary | ICD-10-CM

## 2014-09-21 NOTE — Patient Instructions (Signed)
Discussed orally 
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
## 2014-10-07 IMAGING — CT CT HEAD W/O CM
1 series · 16 of 29 positions shown, 20 images · non-contrast
Comparison: Prior CT from 05/21/2012

CLINICAL DATA: Unresponsive, apneic

EXAM:
CT HEAD WITHOUT CONTRAST
TECHNIQUE: Contiguous axial images were obtained from the base of the skull
through the vertex without intravenous contrast.

[Series 2: head 5.0 h30s · axial · 0.42mm/px · z∈[+899,+1029]mm · 16 of 29 slices shown, 20 images]
[im 2/29  brain]
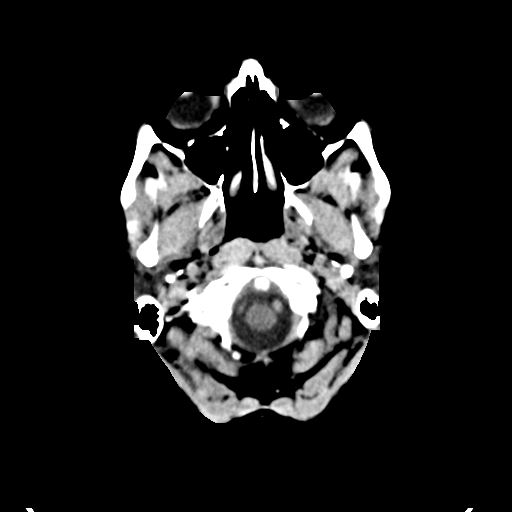
[im 2/29  bone]
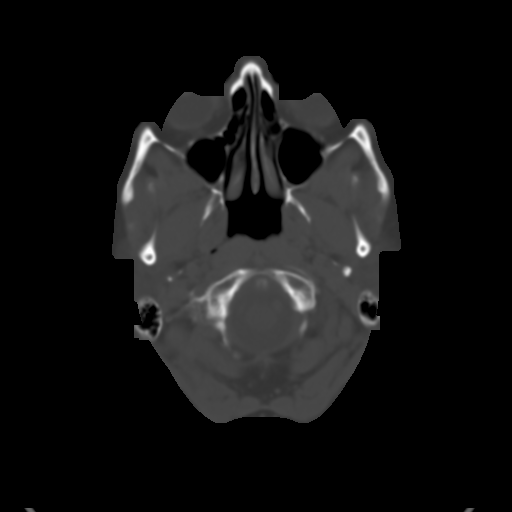
[im 4/29  brain]
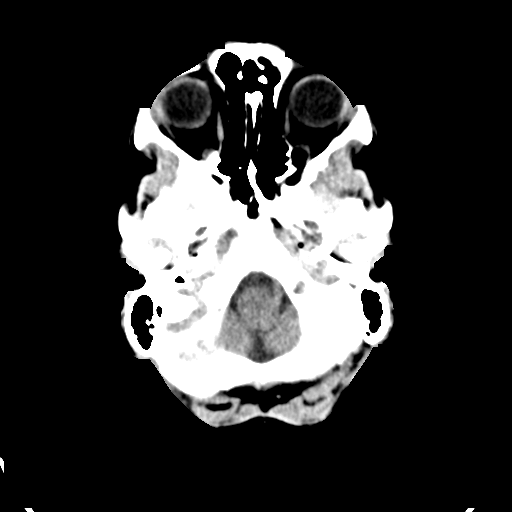
[im 6/29  brain]
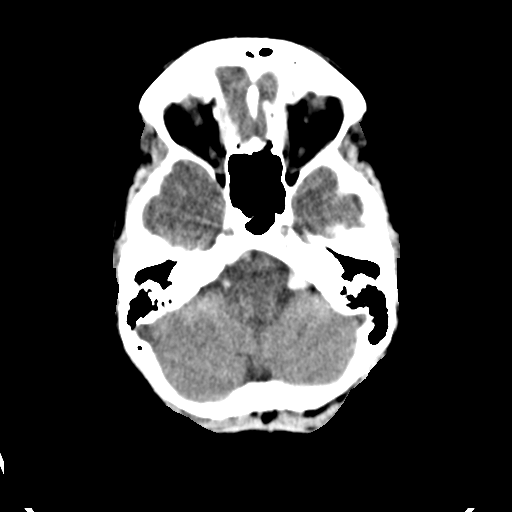
[im 7/29  brain]
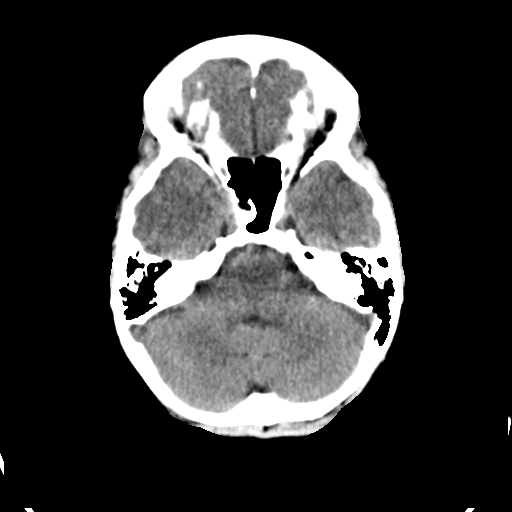
[im 9/29  brain]
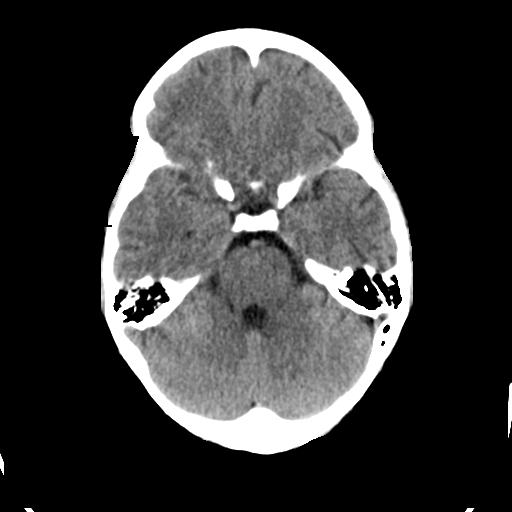
[im 9/29  bone]
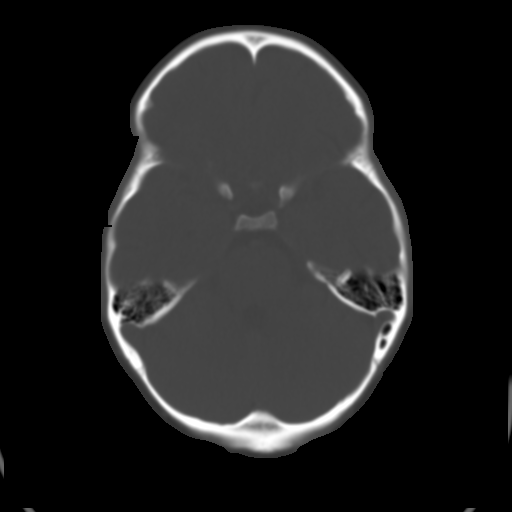
[im 11/29  brain]
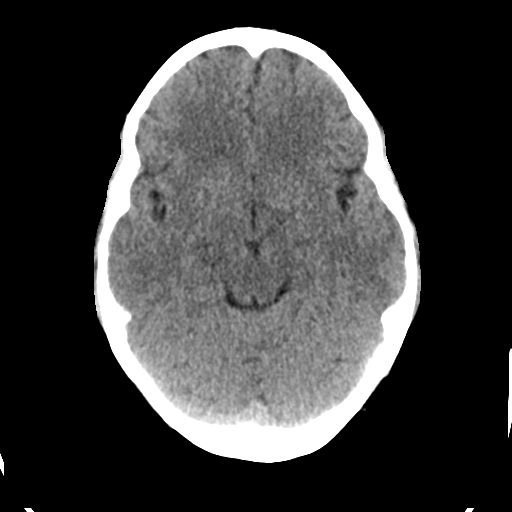
[im 12/29  brain]
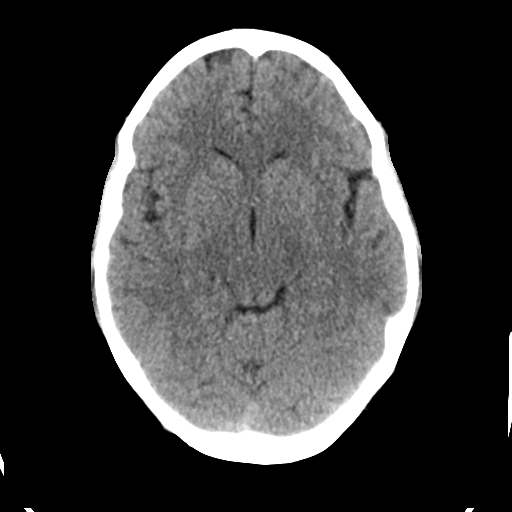
[im 14/29  brain]
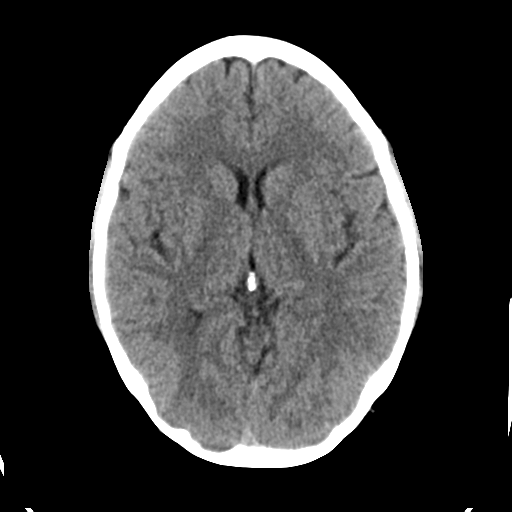
[im 16/29  brain]
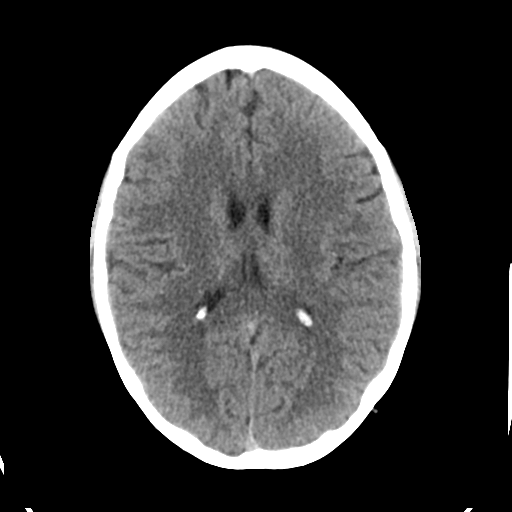
[im 16/29  bone]
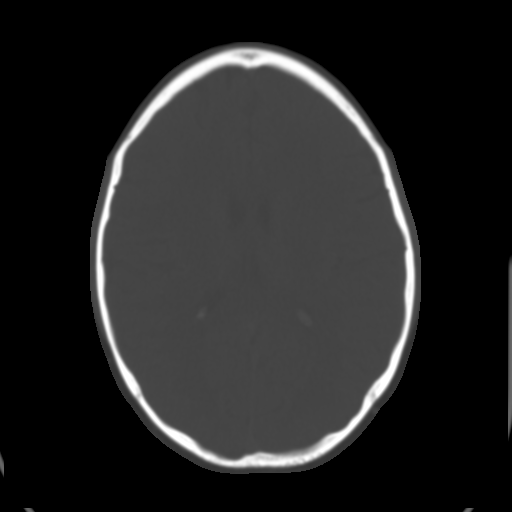
[im 18/29  brain]
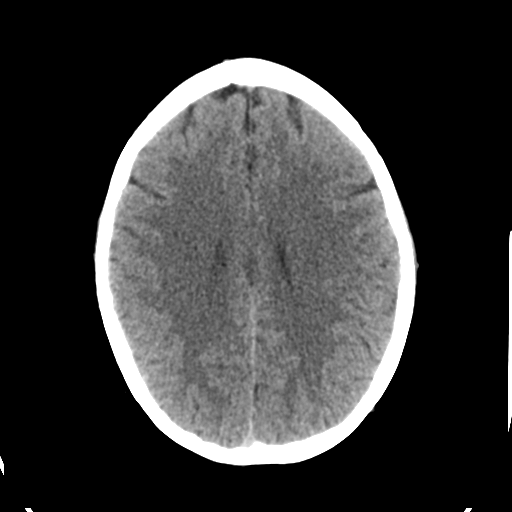
[im 19/29  brain]
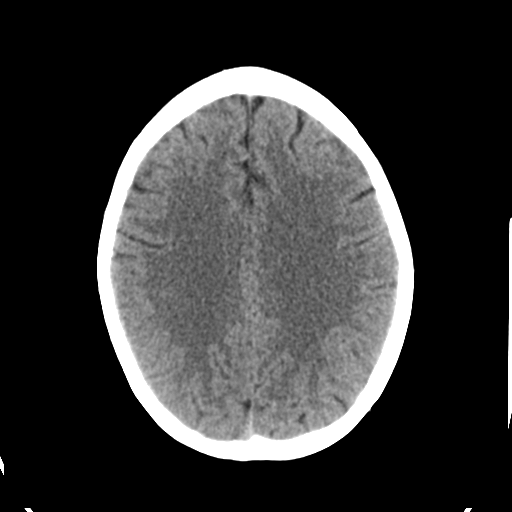
[im 21/29  brain]
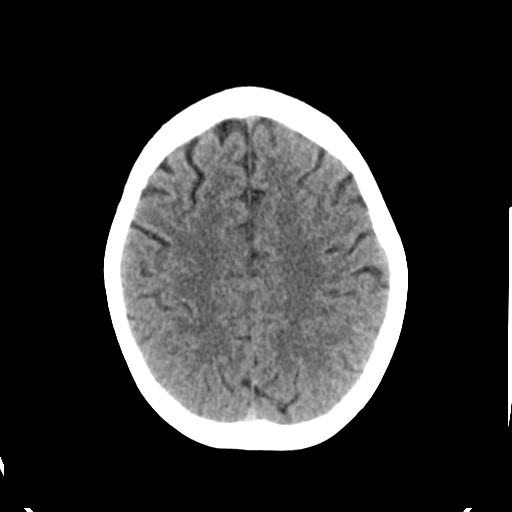
[im 23/29  brain]
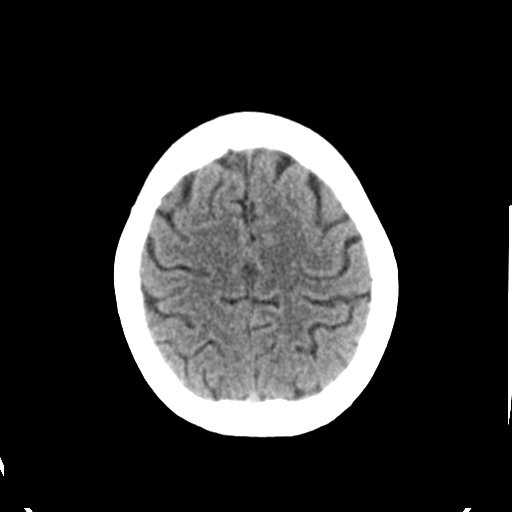
[im 23/29  bone]
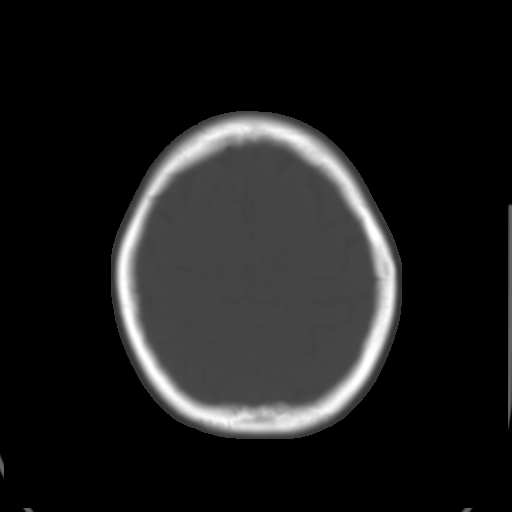
[im 24/29  brain]
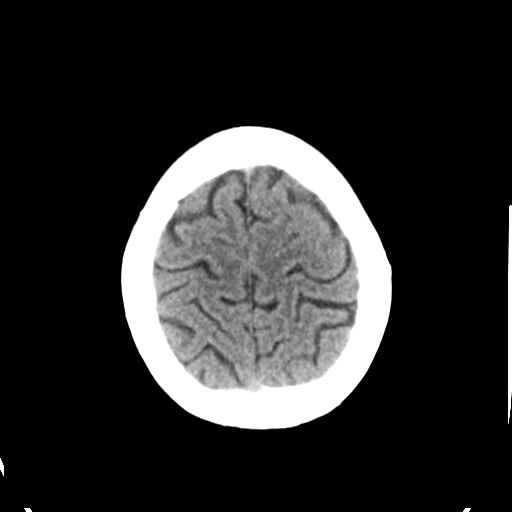
[im 26/29  brain]
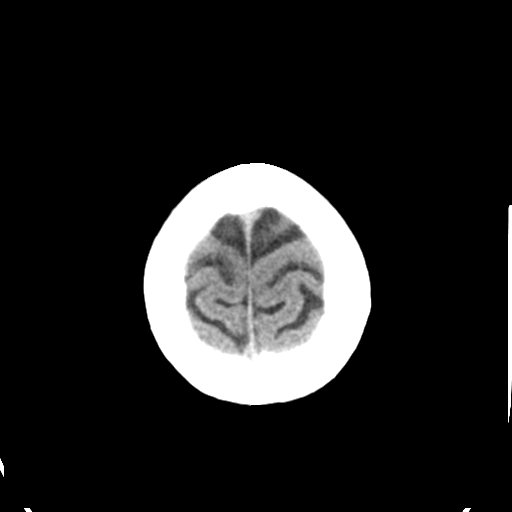
[im 28/29  brain]
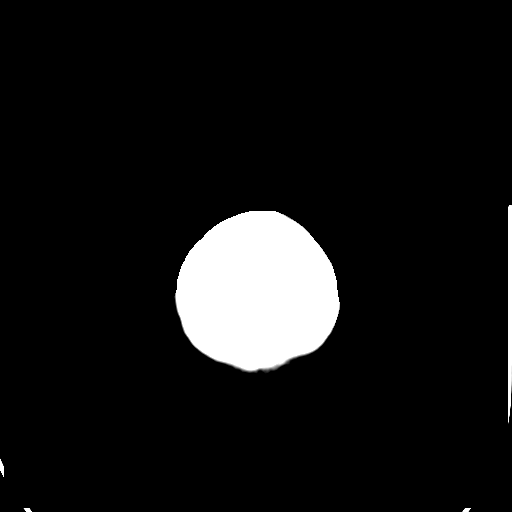

[16 of 29 positions shown; findings below may reference images not displayed]

FINDINGS: No acute intracranial hemorrhage or infarct. Gray-white matter
differentiation is well maintained. The CSF containing spaces are
normal without evidence of diffuse edema. No hydrocephalus. No
extra-axial fluid collection. No mass or midline shift. Calvarium is
intact. Orbits are normal. Paranasal sinuses and mastoid air cells
are clear.
IMPRESSION: No acute intracranial abnormality.

## 2014-11-17 ENCOUNTER — Other Ambulatory Visit: Payer: Self-pay | Admitting: Family Medicine

## 2014-11-17 DIAGNOSIS — E079 Disorder of thyroid, unspecified: Secondary | ICD-10-CM

## 2014-12-09 ENCOUNTER — Ambulatory Visit (INDEPENDENT_AMBULATORY_CARE_PROVIDER_SITE_OTHER): Payer: Managed Care, Other (non HMO) | Admitting: Psychiatry

## 2014-12-09 ENCOUNTER — Encounter (HOSPITAL_COMMUNITY): Payer: Self-pay | Admitting: Psychiatry

## 2014-12-09 VITALS — BP 120/70 | Ht 67.0 in | Wt 150.0 lb

## 2014-12-09 DIAGNOSIS — F329 Major depressive disorder, single episode, unspecified: Secondary | ICD-10-CM

## 2014-12-09 DIAGNOSIS — F411 Generalized anxiety disorder: Secondary | ICD-10-CM

## 2014-12-09 DIAGNOSIS — F1010 Alcohol abuse, uncomplicated: Secondary | ICD-10-CM | POA: Diagnosis not present

## 2014-12-09 DIAGNOSIS — F331 Major depressive disorder, recurrent, moderate: Secondary | ICD-10-CM

## 2014-12-09 MED ORDER — SERTRALINE HCL 100 MG PO TABS: 100.0000 mg | ORAL_TABLET | Freq: Every day | ORAL | Status: DC

## 2014-12-09 MED ORDER — CLONAZEPAM 0.5 MG PO TABS: 0.5000 mg | ORAL_TABLET | Freq: Two times a day (BID) | ORAL | Status: DC | PRN
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
## 2015-01-21 ENCOUNTER — Telehealth (HOSPITAL_COMMUNITY): Payer: Self-pay | Admitting: *Deleted

## 2015-01-22 ENCOUNTER — Ambulatory Visit (HOSPITAL_COMMUNITY): Payer: Self-pay | Admitting: Psychiatry

## 2015-03-09 ENCOUNTER — Other Ambulatory Visit: Payer: Self-pay | Admitting: Medical

## 2015-03-10 NOTE — Telephone Encounter (Signed)
Please let me know what type of appt she is due for and I will call and schedule her and refill until then, thanks.

## 2015-03-10 NOTE — Telephone Encounter (Signed)
Refer x 30 days, and schedule her for physical

## 2015-03-11 NOTE — Telephone Encounter (Signed)
Call out 30 day supply and make her yearly physical appt

## 2015-03-16 ENCOUNTER — Telehealth: Payer: Self-pay | Admitting: Medical

## 2015-03-16 ENCOUNTER — Other Ambulatory Visit: Payer: Self-pay

## 2015-03-16 DIAGNOSIS — E079 Disorder of thyroid, unspecified: Secondary | ICD-10-CM

## 2015-03-16 MED ORDER — SYNTHROID 88 MCG PO TABS: 88.0000 ug | ORAL_TABLET | Freq: Every day | ORAL | Status: DC

## 2015-03-16 NOTE — Telephone Encounter (Signed)
Sent in

## 2015-03-16 NOTE — Telephone Encounter (Signed)
Take care of this 

## 2015-03-16 NOTE — Telephone Encounter (Signed)
Pt called and was wanting a emergency pill refill, she recently moved and the med got lost was wanting it sent to the locally [facility] [address] [place] needs the synthroid refilled,. Pt can be reached at [phone]

## 2015-03-16 NOTE — Telephone Encounter (Signed)
DR.LALONDE IS THIS OKAY 

## 2015-04-01 ENCOUNTER — Other Ambulatory Visit (HOSPITAL_COMMUNITY): Payer: Self-pay | Admitting: Psychiatry

## 2015-04-01 ENCOUNTER — Ambulatory Visit (INDEPENDENT_AMBULATORY_CARE_PROVIDER_SITE_OTHER): Payer: Managed Care, Other (non HMO) | Admitting: Medical

## 2015-04-01 ENCOUNTER — Encounter: Payer: Self-pay | Admitting: Medical

## 2015-04-01 VITALS — BP 110/60 | HR 70 | Temp 98.0°F | Resp 12 | Ht 68.0 in | Wt 152.6 lb

## 2015-04-01 DIAGNOSIS — I341 Nonrheumatic mitral (valve) prolapse: Secondary | ICD-10-CM | POA: Diagnosis not present

## 2015-04-01 DIAGNOSIS — Z23 Encounter for immunization: Secondary | ICD-10-CM | POA: Diagnosis not present

## 2015-04-01 DIAGNOSIS — R079 Chest pain, unspecified: Secondary | ICD-10-CM | POA: Diagnosis not present

## 2015-04-01 DIAGNOSIS — F3341 Major depressive disorder, recurrent, in partial remission: Secondary | ICD-10-CM

## 2015-04-01 DIAGNOSIS — R51 Headache: Secondary | ICD-10-CM | POA: Diagnosis not present

## 2015-04-01 DIAGNOSIS — E038 Other specified hypothyroidism: Secondary | ICD-10-CM | POA: Diagnosis not present

## 2015-04-01 DIAGNOSIS — Z8571 Personal history of Hodgkin lymphoma: Secondary | ICD-10-CM | POA: Diagnosis not present

## 2015-04-01 DIAGNOSIS — R519 Headache, unspecified: Secondary | ICD-10-CM

## 2015-04-01 DIAGNOSIS — K121 Other forms of stomatitis: Secondary | ICD-10-CM

## 2015-04-01 DIAGNOSIS — J4520 Mild intermittent asthma, uncomplicated: Secondary | ICD-10-CM | POA: Insufficient documentation

## 2015-04-01 DIAGNOSIS — I7300 Raynaud's syndrome without gangrene: Secondary | ICD-10-CM | POA: Diagnosis not present

## 2015-04-01 DIAGNOSIS — H8113 Benign paroxysmal vertigo, bilateral: Secondary | ICD-10-CM | POA: Diagnosis not present

## 2015-04-01 DIAGNOSIS — I340 Nonrheumatic mitral (valve) insufficiency: Secondary | ICD-10-CM | POA: Diagnosis not present

## 2015-04-01 DIAGNOSIS — Z0000 Encounter for general adult medical examination without abnormal findings: Secondary | ICD-10-CM | POA: Diagnosis not present

## 2015-04-01 DIAGNOSIS — H8110 Benign paroxysmal vertigo, unspecified ear: Secondary | ICD-10-CM | POA: Insufficient documentation

## 2015-04-01 DIAGNOSIS — R002 Palpitations: Secondary | ICD-10-CM | POA: Diagnosis not present

## 2015-04-01 DIAGNOSIS — C8190 Hodgkin lymphoma, unspecified, unspecified site: Secondary | ICD-10-CM

## 2015-04-01 DIAGNOSIS — F411 Generalized anxiety disorder: Secondary | ICD-10-CM

## 2015-04-01 DIAGNOSIS — Z2821 Immunization not carried out because of patient refusal: Secondary | ICD-10-CM | POA: Insufficient documentation

## 2015-04-01 LAB — COMPREHENSIVE METABOLIC PANEL
ALK PHOS: 36 U/L (ref 33–115)
ALT: 25 U/L (ref 6–29)
AST: 29 U/L (ref 10–30)
Albumin: 4.1 g/dL (ref 3.6–5.1)
BILIRUBIN TOTAL: 0.4 mg/dL (ref 0.2–1.2)
BUN: 15 mg/dL (ref 7–25)
CO2: 23 mmol/L (ref 20–31)
Calcium: 9.1 mg/dL (ref 8.6–10.2)
Chloride: 107 mmol/L (ref 98–110)
Creat: 0.69 mg/dL (ref 0.50–1.10)
GLUCOSE: 86 mg/dL (ref 65–99)
Potassium: 4.4 mmol/L (ref 3.5–5.3)
Sodium: 141 mmol/L (ref 135–146)
Total Protein: 6.7 g/dL (ref 6.1–8.1)

## 2015-04-01 LAB — LIPID PANEL
Cholesterol: 220 mg/dL — ABNORMAL HIGH (ref 125–200)
HDL: 72 mg/dL (ref >=46)
LDL Cholesterol: 137 mg/dL — ABNORMAL HIGH (ref <130)
Total CHOL/HDL Ratio: 3.1 Ratio (ref <=5.0)
Triglycerides: 57 mg/dL (ref <150)
VLDL: 11 mg/dL (ref <30)

## 2015-04-01 MED ORDER — MECLIZINE HCL 25 MG PO TABS: 25.0000 mg | ORAL_TABLET | Freq: Two times a day (BID) | ORAL | Status: DC

## 2015-04-01 MED ORDER — NEBIVOLOL HCL 2.5 MG PO TABS: 2.5000 mg | ORAL_TABLET | Freq: Every day | ORAL | Status: DC

## 2015-04-01 NOTE — Progress Notes (Signed)
Subjective:   HPI  Lisa Waters is a 34 y.o. female who presents for a complete physical.  Medical care team includes:  Dr. Ellyn Hack, Cardiology  Dermatology in [place], also her oral ulcer specialist  Behavioral Health  Dr. Kerin Perna, gynecology/fertility specialist Glade Lloyd, Hensley Aziz Audelia Acton, PA-C here for primary care   Concerns: For months been having daily headaches.  Lately gets some dizziness like being on a boat.   No paresthesias, no vision or hearing changes, no slurred speech.  Denies gallery or sinus problems.  Using OTC analgesic  Reviewed their medical, surgical, family, social, medication, and allergy history and updated chart as appropriate.  Past Medical History  Diagnosis Date  . History of hodgkin's lymphoma 2007    in remission, sees [facility], Dr. Marcell Anger; prior chemotherapy and radiation therapy; sees yearly in December  . Hypothyroidism   . Depression   . Anxiety   . GERD (gastroesophageal reflux disease)   . MVP (mitral valve prolapse) March 2012    Dr. Ellyn Hack, [facility]; a) Echo 3/'12: Mod MR; EF 60-65%; b) TEE 5/'12: Nl LV Fxn, EF 60-65%, mild, holosystolic prolapse of the medial anterior leaflet. Mod MR . c) Echo 3/'15: Moderate, holosystolic prolapse of  medial. Mod MR d) TM Stress Echo (@ [facility]) Nl Lv Fxn, Mild-Mod MR @ Res0 --> Mod MR at HR 181 bpm (7:22 min, 10.10 METS) w/p WMA  . Moderate mitral regurgitation by prior echocardiogram 09/2010    See above  . Palpitations 4/14    holter monitor, Dr. Ellyn Hack; no arrhythmia   . History of mammogram 12/2012    normal  . H/O amaurosis fugax Fall 2012    MRI of brain/brain stem: no acute abnormality, small area of encephalomalacia left superior cerebellum that could be prior trauma, infection, or lacunar infarct  . Reactive airway disease with wheezing     PFTs March 2014: mild obstructive defect, normal volumes, normal diffusion, +response to bronchodilator, FEV1 96% predicted, FEV1/FVC 69%; Pulmonology consult 4/14,  thought to be related to non-selective beta blockers - she had these symptoms before beta blockers.  Pindolol was changed to Bystolic  . Asthma   . COPD (chronic obstructive pulmonary disease)     COPD changes with right upper lobe scarring; vs. post radiation changes  . Migraine     associated ocular symptoms, Dr. Lethea Killings, [facility]  . Oral ulcer     Past Surgical History  Procedure Laterality Date  . Appendectomy  1993  . Breast enhancement surgery    . Rhinoplasty  2007    deviated septum  . Lymph node biopsy  2006  . Transthoracic echocardiogram  09/30/2012; 09/15/2013    LVEF 60-65%, wall motion normal, LV function normal, moderate holosystolic MVP (medial the middle scallop of the posterior leaflet), moderate regurgitation (directed posteriorly), no shunt -- stable over 2 years  . Esophagogastroduodenoscopy  04/11/13    [facility] Dr. Collene Mares  . Insertion central venous access device w/ subcutaneous port  2006  . Nm myocar perf wall motion  12/2010    persantine myoview - moderate breast attenuation (fixed mid-distal anterior defect), EF 68%, low risk scan  . Tee without cardioversion  11/2010    Nl LV Fxn, EF 60-65%, mild, holosystolic prolapse of the medial anterior leaflet. Mod MR .     Social History   Social History  . Marital Status: Married    Spouse Name: N/A  . Number of Children: 0  . Years of Education: N/A  Occupational History  . works in [place] [employer]  . Smoking status: Former Smoker -- 1.00 packs/day for 10 years    Types: Cigarettes    Quit date: 11/07/2009  . Smokeless tobacco: Never Used  . Alcohol Use: No  . Drug Use: No  . Sexual Activity:    Partners: Male    Birth Control/ Protection: None   Other Topics Concern  . 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 Skin cancer Father   . Emphysema Paternal Grandfather     was a smoker, heart problems  . Diabetes Maternal Aunt   . OCD Maternal Aunt   . Stroke Maternal Grandmother   . Cancer Paternal Grandmother     metastasis  . Heart disease Neg Hx   . Hypertension Neg Hx   . Hyperlipidemia Neg Hx   . Alcohol abuse Mother   . Bipolar disorder Maternal Aunt      Current outpatient prescriptions:  .  aspirin EC 81 MG tablet, Take by mouth., Disp: , Rfl:  .  clonazePAM (KLONOPIN) 0.5 MG tablet, Take 1 tablet (0.5 mg total) by mouth 2 (two) times daily as needed for anxiety., Disp: 60 tablet, Rfl: 1 .  famotidine (PEPCID) 20 MG tablet, One at bedtime, Disp: 30 tablet, Rfl: 11 .  meclizine (ANTIVERT) 25 MG tablet, Take 1 tablet (25 mg total) by mouth 2 (two) times daily., Disp: 30 tablet, Rfl: 0 .  nebivolol (BYSTOLIC) 2.5 MG tablet, Take 1 tablet (2.5 mg total) by mouth daily., Disp: 30 tablet, Rfl: 1 .  sertraline (ZOLOFT) 100 MG tablet, Take 1 tablet (100 mg total) by mouth daily., Disp: 30 tablet, Rfl: 2 .  SUMAtriptan (IMITREX) 100 MG tablet, TAKE 1 TABLET BY MOUTH EVERY 2 HOURS AS NEEDED FOR MIGRAINE. DO NOT EXCEED MORE THAN 2 TABLETS PER DAY., Disp: 10 tablet, Rfl: 5 .  SYNTHROID 88 MCG tablet, TAKE 1 TABLET DAILY, Disp: 90 tablet, Rfl: 0 .  SYNTHROID 88 MCG tablet, Take 1 tablet (88 mcg total) by mouth daily., Disp: 90 tablet, Rfl: 0 .  THALOMID 50 MG capsule, , Disp: , Rfl:   Allergies  Allergen Reactions  . Codeine Itching  . Fish Allergy     Mahi Mahi  . Iohexol Hives  . Pindolol     Avoid non selective beta blockers due to dyspnea  . Iodinated Diagnostic Agents Rash    Has received since rash developed without pre-meds without development of rash.       Review of Systems Constitutional: -fever, -chills, -sweats, -unexpected weight change, -decreased appetite, -fatigue Allergy: -sneezing, -itching,  -congestion Dermatology: -changing moles, --rash, -lumps ENT: -runny nose, -ear pain, -sore throat, -hoarseness, -sinus pain, -teeth pain, - ringing in ears, -hearing loss, -nosebleeds Cardiology: -chest pain, -palpitations, -swelling, -difficulty breathing when lying flat, -waking up short of breath Respiratory: -cough, -shortness of breath, -difficulty breathing with exercise or exertion, -wheezing, -coughing up blood Gastroenterology: -abdominal pain, -nausea, -vomiting, -diarrhea, -constipation, -blood in stool, -changes in bowel movement, -difficulty swallowing or eating Hematology: -bleeding, -bruising  Musculoskeletal: -joint aches, -muscle aches, -joint swelling, -back pain, -neck pain, -cramping, -changes in gait Ophthalmology: denies vision changes, eye redness, itching, discharge Urology: -burning with urination, -  difficulty urinating, -blood in urine, -urinary frequency, -urgency, -incontinence Neurology: +headache, -weakness, -tingling, -numbness, -memory loss, -falls, +dizziness Psychology: -depressed mood, -agitation, -sleep problems     Objective:   Physical Exam  BP 110/60 mmHg  Pulse 70  Temp(Src) 98 F (36.7 C)  Resp 12  Ht 5\' 8"  (1.727 m)  Wt 152 lb 9.6 oz (69.219 kg)  BMI 23.21 kg/m2  General appearance: alert, no distress, WD/WN, white female Skin: scattered macules, no worrisome lesions, patient was wearing a dress, so was not in gown, unable to fully eval skin, otherwise warm, dry, whole left arm sleeve tattoo HEENT: normocephalic, conjunctiva/corneas normal, sclerae anicteric, PERRLA, EOMi, nares patent, no discharge or erythema, pharynx normal Oral cavity: MMM, tongue normal, teeth in good repair Neck: supple, no lymphadenopathy, no thyromegaly, no masses, normal ROM, no bruits Chest: non tender, normal shape and expansion Heart: RRR, normal S1, S2, no murmurs Lungs: CTA bilaterally, no wheezes, rhonchi, or rales Abdomen: +bs, soft, RLQ surgical scars, non  tender, non distended, no masses, no hepatomegaly, no splenomegaly, no bruits Back: non tender, normal ROM, no scoliosis Musculoskeletal: upper extremities non tender, no obvious deformity, normal ROM throughout, lower extremities non tender, no obvious deformity, normal ROM throughout Extremities: no edema, no cyanosis, no clubbing Pulses: 2+ symmetric, upper and lower extremities, normal cap refill Neurological: alert, oriented x 3, CN2-12 intact, strength normal upper extremities and lower extremities, sensation normal throughout, DTRs 2+ throughout, no cerebellar signs, gait normal Psychiatric: normal affect, behavior normal, pleasant  Breast/gyn/rectal - deferred to gynecology    Assessment and Plan :    Encounter Diagnoses  Name Primary?  . Encounter for health maintenance examination in adult Yes  . Need for Tdap vaccination   . Other specified hypothyroidism   . Chronic daily headache   . Benign paroxysmal positional vertigo, bilateral   . Influenza vaccination declined   . Raynauds disease   . MVP (mitral valve prolapse)   . Moderate mitral regurgitation by prior echocardiogram   . Chest pain with low risk of acute coronary syndrome   . Heart palpitations   . Hodgkin's disease in remission   . Major depressive disorder, recurrent episode, in partial remission   . Generalized anxiety disorder   . Asthma, mild intermittent, uncomplicated   . Oral ulcer       Physical exam - discussed healthy lifestyle, diet, exercise, preventative care, vaccinations, and addressed their concerns.  Handout given. Counseled on the Tdap (tetanus, diptheria, and acellular pertussis) vaccine.  Vaccine information sheet given. Tdap vaccine given after consent obtained. She declines influenza vaccine today Hypothyroidism  - labs today headaches - begin bystolic daily, advised headache diary, recheck 3-4 wk BPPV - begin trial of Meclizine reviewed most recent cardiology notes in chart  record reviewed health history, updated health history Follow-up pending labs

## 2015-04-02 ENCOUNTER — Encounter: Payer: Self-pay | Admitting: Medical

## 2015-04-02 LAB — CBC
HEMATOCRIT: 38 % (ref 36.0–46.0)
Hemoglobin: 12.5 g/dL (ref 12.0–15.0)
MCH: 29.3 pg (ref 26.0–34.0)
MCHC: 32.9 g/dL (ref 30.0–36.0)
MCV: 89 fL (ref 78.0–100.0)
MPV: 8.3 fL — AB (ref 8.6–12.4)
Platelets: 240 10*3/uL (ref 150–400)
RBC: 4.27 MIL/uL (ref 3.87–5.11)
RDW: 14.2 % (ref 11.5–15.5)
WBC: 4.5 10*3/uL (ref 4.0–10.5)

## 2015-04-02 LAB — TSH: TSH: 2.746 u[IU]/mL (ref 0.350–4.500)

## 2015-04-02 LAB — T4, FREE: Free T4: 1.1 ng/dL (ref 0.80–1.80)

## 2015-04-12 ENCOUNTER — Telehealth (HOSPITAL_COMMUNITY): Payer: Self-pay | Admitting: *Deleted

## 2015-04-12 NOTE — Telephone Encounter (Signed)
Pt pharmacy requesting refills for pt Sertraline 100 mg QD. Called pt and informed her that her pharmacy is requesting refills for her Zolft and office need to schedule appt. Pt stated that she moved to [facility] and would like to know if Dr. Harrington Challenger was fine with her going to the [place] location. Per pt she, she would also like to know if Dr. Harrington Challenger was fine with her going to the [facility] location. Per pt she, she would also like to know if Dr. Harrington Challenger could fill her medication while she waits for an appt with the [place] location if she can transfer. Pt number is [phone]

## 2015-04-12 NOTE — Telephone Encounter (Signed)
Pt pharmacy requesting refills for pt Sertraline 100 mg QD. Called pt and informed her that her pharmacy is requesting refills for her Zolft and office need to schedule appt. Pt stated that she moved to [facility] and would like to know if Dr. Harrington Challenger was fine with her going to the [place] location. Per pt she, she would also like to know if Dr. Harrington Challenger was fine with her going to the [facility] location. Per pt she, she would also like to know if Dr. Harrington Challenger could fill her medication while she waits for an appt with the [place] location if she can transfer. Pt number is [phone]

## 2015-04-12 NOTE — Telephone Encounter (Signed)
Pt pharmacy requesting refills for pt Sertraline 100 mg QD. Called pt and informed her that her pharmacy is requesting refills for her Zolft and office need to schedule appt. Pt stated that she moved to [facility] and would like to know if Dr. Harrington Challenger was fine with her going to the [place] location. Per pt she, she would also like to know if Dr. Harrington Challenger could fill her medication while she waits for an appt with the [place] location if she can transfer. Pt number is [phone].

## 2015-04-12 NOTE — Telephone Encounter (Signed)
Yes that is fine, you may send in 2 refills on Zoloft

## 2015-04-13 ENCOUNTER — Telehealth (HOSPITAL_COMMUNITY): Payer: Self-pay | Admitting: *Deleted

## 2015-04-13 MED ORDER — SERTRALINE HCL 100 MG PO TABS: 100.0000 mg | ORAL_TABLET | Freq: Every day | ORAL | Status: DC

## 2015-04-13 NOTE — Telephone Encounter (Signed)
Spoke with pt and she showed understanding and stated she will call [place] location to sch new pt appt with them

## 2015-04-13 NOTE — Telephone Encounter (Signed)
Called pt and lmtcb to inform of what provider stated. Number provided.

## 2015-04-13 NOTE — Telephone Encounter (Signed)
Per provider to send 2 refills of pt Zolft to pharmacy. Sent refills to pt pharmacy and called pt to inform her that Dr. Harrington Challenger do not mind that she transfer to the [facility] location. lmtcb on pt voicemail and number was provided

## 2015-05-10 ENCOUNTER — Telehealth (HOSPITAL_COMMUNITY): Payer: Self-pay | Admitting: *Deleted

## 2015-05-10 ENCOUNTER — Other Ambulatory Visit (HOSPITAL_COMMUNITY): Payer: Self-pay | Admitting: Psychiatry

## 2015-05-10 MED ORDER — SERTRALINE HCL 100 MG PO TABS: 100.0000 mg | ORAL_TABLET | Freq: Every day | ORAL | Status: DC

## 2015-05-10 NOTE — Telephone Encounter (Signed)
Changed to 90 days.

## 2015-05-10 NOTE — Telephone Encounter (Signed)
Pt pharmacy requesting 90 days supply for pt Zoloft due to insurance only paying for 90 days supply. Pt medication last filled 04-13-15 with 30 tablets 2 refills. Pt pharmacy number is [phone].

## 2015-05-11 NOTE — Telephone Encounter (Signed)
Noted  

## 2015-08-14 ENCOUNTER — Other Ambulatory Visit: Payer: Self-pay | Admitting: Family Medicine

## 2015-08-16 ENCOUNTER — Other Ambulatory Visit: Payer: Self-pay | Admitting: *Deleted

## 2015-08-16 DIAGNOSIS — E079 Disorder of thyroid, unspecified: Secondary | ICD-10-CM

## 2015-08-16 MED ORDER — SYNTHROID 88 MCG PO TABS: 88.0000 ug | ORAL_TABLET | Freq: Every day | ORAL | Status: DC

## 2015-08-18 ENCOUNTER — Other Ambulatory Visit (HOSPITAL_COMMUNITY): Payer: Self-pay | Admitting: Psychiatry

## 2015-10-16 HISTORY — PX: TRANSTHORACIC ECHOCARDIOGRAM: SHX275

## 2015-10-19 ENCOUNTER — Ambulatory Visit (INDEPENDENT_AMBULATORY_CARE_PROVIDER_SITE_OTHER): Payer: Managed Care, Other (non HMO) | Admitting: Cardiology

## 2015-10-19 ENCOUNTER — Encounter: Payer: Self-pay | Admitting: Cardiology

## 2015-10-19 VITALS — BP 126/76 | HR 67 | Ht 67.0 in | Wt 160.5 lb

## 2015-10-19 DIAGNOSIS — R002 Palpitations: Secondary | ICD-10-CM

## 2015-10-19 DIAGNOSIS — I340 Nonrheumatic mitral (valve) insufficiency: Secondary | ICD-10-CM

## 2015-10-19 DIAGNOSIS — I341 Nonrheumatic mitral (valve) prolapse: Secondary | ICD-10-CM | POA: Diagnosis not present

## 2015-10-19 NOTE — Patient Instructions (Signed)
NO CHANGE IN CURRENT MEDICATIONS   Your physician has requested that you have an echocardiogram AT [facility]. Echocardiography is a painless test that uses sound waves to create images of your heart. It provides your doctor with information about the size and shape of your heart and how well your heart's chambers and valves are working. This procedure takes approximately one hour. There are no restrictions for this procedure.  Your physician wants you to follow-up in [place].  You will receive a reminder letter in the mail two months in advance. If you don't receive a letter, please call our office to schedule the follow-up appointment.   If you need a refill on your cardiac medications before your next appointment, please call your pharmacy.

## 2015-10-19 NOTE — Progress Notes (Signed)
PCP: Crisoforo Oxford, PA-C  Clinic Note: Chief Complaint  Patient presents with  . Annual Exam    No SOB no chest pain no swlling  . Mitral Valve Prolapse    With mitral regurgitation    HPI: Lisa Waters is a 35 y.o. female with a PMH below who presents today for 15 month f/u - Mod MR dx'd in setting of CP.[name] was last seen in Jan 2016 - at that time she was depressed, anxious and upset. She's had multiple different symptoms and abnormalities. She was being seen by multiple specialists. She is having blackout spells of anxiety attacks. Blood in her stool etc. She had even gone to [facility] where she had a treadmill echo cardiogram done to evaluate her mitral valve. This only showed mild to moderate MR.  Recent Hospitalizations: None  Studies Reviewed: No new studies  Interval History: Thankfully, Kenney Houseman presents today back in her normal happy mood. Apparently after seeing multiple different doctors, finally a dermatologist started her on thalidomide (noting that she is no longer trying to get pregnant) for presumed autoimmune condition and all of her symptoms seem to have cleared up. She is now with the exception of having a slight bloating sensation and may be gaining a little bit of weight, is feeling great. She is back into a normal mood and spirits. She is back working, no longer depressed or anxious. His exercising and no longer significantly dyspneic. Normal blood in the stools. No more syncope spells.   No chest pain or shortness of breath with rest or exertion. No PND, orthopnea or edema. No palpitations, lightheadedness, dizziness, weakness or syncope/near syncope. No TIA/amaurosis fugax symptoms. No melena, hematochezia, hematuria, or epstaxis. No claudication.  ROS: A comprehensive was performed. Review of Systems  Constitutional: Negative for malaise/fatigue.  HENT: Negative for congestion and nosebleeds.   Respiratory: Positive for wheezing (Still only has  some mild wheezing from allergies but nothing significant recent asthma attack). Negative for cough.   Gastrointestinal: Negative for heartburn, constipation, blood in stool and melena.  Genitourinary: Negative for hematuria.  Musculoskeletal: Negative for myalgias, joint pain and falls.  Neurological: Negative for dizziness and headaches.  Psychiatric/Behavioral: Positive for depression (Controlled with Zoloft). Negative for memory loss. The patient is nervous/anxious (Still has some nervousness and anxiety, but is well controlled. She is taking Zoloft). The patient does not have insomnia.   All other systems reviewed and are negative.   Past Medical History  Diagnosis Date  . History of hodgkin's lymphoma 2007    in remission, sees [facility], Dr. Marcell Anger; prior chemotherapy and radiation therapy; sees yearly in December  . Hypothyroidism   . Depression   . Anxiety   . GERD (gastroesophageal reflux disease)   . MVP (mitral valve prolapse) March 2012    Dr. Ellyn Hack, [facility]; a) Echo 3/'12: Mod MR; EF 60-65%; b) TEE 5/'12: Nl LV Fxn, EF 60-65%, mild, holosystolic prolapse of the medial anterior leaflet. Mod MR . c) Echo 3/'15: Moderate, holosystolic prolapse of  medial. Mod MR d) TM Stress Echo (@ [facility]) Nl Lv Fxn, Mild-Mod MR @ Res0 --> Mod MR at HR 181 bpm (7:22 min, 10.10 METS) w/p WMA  . Moderate mitral regurgitation by prior echocardiogram 09/2010    See above  . Palpitations 4/14    holter monitor, Dr. Ellyn Hack; no arrhythmia   . History of mammogram 12/2012    normal  . H/O amaurosis fugax Fall 2012    MRI of  brain/brain stem: no acute abnormality, small area of encephalomalacia left superior cerebellum that could be prior trauma, infection, or lacunar infarct  . Reactive airway disease with wheezing     PFTs March 2014: mild obstructive defect, normal volumes, normal diffusion, +response to bronchodilator, FEV1 96% predicted, FEV1/FVC 69%; Pulmonology consult 4/14, thought to be related to  non-selective beta blockers - she had these symptoms before beta blockers.  Pindolol was changed to Bystolic  . Asthma   . COPD (chronic obstructive pulmonary disease) (HCC)     COPD changes with right upper lobe scarring; vs. post radiation changes  . Migraine     associated ocular symptoms, Dr. Lethea Killings, [facility]  . Oral ulcer     Past Surgical History  Procedure Laterality Date  . Appendectomy  1993  . Breast enhancement surgery    . Rhinoplasty  2007    deviated septum  . Lymph node biopsy  2006  . Transthoracic echocardiogram  09/30/2012; 09/15/2013    LVEF 60-65%, wall motion normal, LV function normal, moderate holosystolic MVP (medial the middle scallop of the posterior leaflet), moderate regurgitation (directed posteriorly), no shunt -- stable over 2 years  . Esophagogastroduodenoscopy  04/11/13    [facility] Dr. Collene Mares  . Insertion central venous access device w/ subcutaneous port  2006  . Nm myocar perf wall motion  12/2010    persantine myoview - moderate breast attenuation (fixed mid-distal anterior defect), EF 68%, low risk scan  . Tee without cardioversion  11/2010    Nl LV Fxn, EF 60-65%, mild, holosystolic prolapse of the medial anterior leaflet. Mod MR .    Prior to Admission medications   Medication Sig Start Date End Date Taking? Authorizing Provider  sertraline (ZOLOFT) 100 MG tablet Take 1 tablet (100 mg total) by mouth daily. 05/10/15 05/09/16 Yes Cloria Spring, MD  SUMAtriptan (IMITREX) 100 MG tablet TAKE 1 TABLET BY MOUTH EVERY 2 HOURS AS NEEDED FOR MIGRAINE. DO NOT EXCEED MORE THAN 2 TABLETS PER DAY. 09/24/13  Yes Denita Lung, MD  SYNTHROID 88 MCG tablet TAKE 1 TABLET DAILY 03/11/15  Yes Camelia Eng Tysinger, PA-C  THALOMID 50 MG capsule Take 50 mg by mouth at bedtime.  12/02/14  Yes Historical Provider, MD   Allergies  Allergen Reactions  . Codeine Itching  . Fish Allergy     [place]  . Iohexol Hives  . Pindolol     Avoid non  selective beta blockers due to dyspnea  . Iodinated Diagnostic Agents Rash    Has received since rash developed without pre-meds without development of rash.   Social History   Social History  . Marital Status: Married    Spouse Name: N/A  . Number of Children: 0  . Years of Education: N/A   Occupational History  . works in [place] Topics  . Smoking status: Former Smoker -- 1.00 packs/day for 10 years    Types: Cigarettes    Quit date: 11/07/2009  . Smokeless tobacco: Never Used  . Alcohol Use: No  . Drug Use: No  . Sexual Activity:    Partners: Male    Birth Control/ Protection: None   Other Topics Concern  . 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 Skin cancer Father   . Emphysema Paternal Grandfather     was a smoker, heart problems  . Diabetes Maternal Aunt   . OCD Maternal Aunt   . Stroke Maternal Grandmother   . Cancer Paternal Grandmother     metastasis  . Heart disease Neg Hx   . Hypertension Neg Hx   . Hyperlipidemia Neg Hx   . Alcohol abuse Mother   . Bipolar disorder Maternal Aunt      Wt Readings from Last 3 Encounters:  10/19/15 160 lb 8 oz (72.802 kg)  04/02/15 152 lb 9.6 oz (69.219 kg)  12/09/14 150 lb (68.04 kg)    PHYSICAL EXAM BP 126/76 mmHg  Pulse 67  Ht 5\' 7"  (1.702 m)  Wt 160 lb 8 oz (72.802 kg)  BMI 25.13 kg/m2 General appearance: A&O X 3, No acute distress. Normal, happy mood and affect. HEENT: [place]/at, EOMI, MMM, anicteric sclera  Neck: no adenopathy, no carotid bruit, no JVD and supple, symmetrical, trachea midline  Lungs: clear to auscultation bilaterally, normal percussion bilaterally and Nonlabored, no active wheezing, good air movement  Heart: normal apical impulse, regular rate and rhythm, S1, S2 normal, no  S3 or S4, systolic murmur: holosystolic 3/6, blowing at lower left sternal border, at apex, --> radiates to axilla, no click and no rub  Abdomen: soft, non-tender; bowel sounds normal; no masses, no organomegaly  Extremities: extremities normal, atraumatic, no cyanosis or edema  Pulses: 2+ and symmetric  Neurologic: Grossly normal     Adult ECG Report  Rate: 67 ;  Rhythm: normal sinus rhythm and Normal axis, intervals and durations. High voltage in inferior and lateral leads;   Narrative Interpretation: Stable EKG   Other studies Reviewed: Additional studies/ records that were reviewed today include:  Recent Labs:  n/s   ASSESSMENT / PLAN: Problem List Items Addressed This Visit    MVP (mitral valve prolapse) (Chronic)    Prolapse with moderate MR on echo. Consider antibiotic prophylaxis for GI and procedures.      Moderate mitral regurgitation by prior echocardiogram - Primary (Chronic)    Last evaluation of her valve was in 2015. She is probably due for new evaluation at this point.  We'll check new daily echocardiogram to reassess mitral regurgitation. As long as her MR is stable and LV function is stable, no need to proceed with any further invasive evaluation No longer on any antihypertensives, and her blood pressure looks great.      Relevant Orders   EKG 12-Lead (Completed)   ECHOCARDIOGRAM COMPLETE   Heart palpitations (Chronic)    Notably improved after starting thalidomide. No longer on beta blocker. She had been on pindolol and Bystolic etc. Now not on anything. No further palpitations.      Relevant Orders   EKG 12-Lead (Completed)   ECHOCARDIOGRAM COMPLETE      Current medicines are reviewed at length with the patient today. (+/- concerns)  none The following changes have been made: none Studies Ordered:   Orders Placed This Encounter  Procedures  . EKG 12-Lead  . ECHOCARDIOGRAM COMPLETE   One-year follow-up   Leonie Man, M.D.,  M.S. Interventional Cardiologist   Pager # [phone] Phone # [phone] [address] [place], [place] [address]

## 2015-10-22 ENCOUNTER — Encounter: Payer: Self-pay | Admitting: Cardiology

## 2015-10-22 NOTE — Assessment & Plan Note (Signed)
Prolapse with moderate MR on echo. Consider antibiotic prophylaxis for GI and procedures.

## 2015-10-22 NOTE — Assessment & Plan Note (Signed)
Last evaluation of her valve was in 2015. She is probably due for new evaluation at this point.  We'll check new daily echocardiogram to reassess mitral regurgitation. As long as her MR is stable and LV function is stable, no need to proceed with any further invasive evaluation No longer on any antihypertensives, and her blood pressure looks great.

## 2015-10-22 NOTE — Assessment & Plan Note (Signed)
Notably improved after starting thalidomide. No longer on beta blocker. She had been on pindolol and Bystolic etc. Now not on anything. No further palpitations.

## 2015-11-05 ENCOUNTER — Telehealth: Payer: Self-pay | Admitting: Medical

## 2015-11-05 NOTE — Telephone Encounter (Signed)
Received signed records request to send records to [facility], [facility]. Records sent via secure PDF attached to email. [email or web address]

## 2015-11-09 ENCOUNTER — Ambulatory Visit (HOSPITAL_COMMUNITY): Payer: Managed Care, Other (non HMO) | Attending: Cardiology

## 2015-11-09 ENCOUNTER — Other Ambulatory Visit: Payer: Self-pay

## 2015-11-09 DIAGNOSIS — I341 Nonrheumatic mitral (valve) prolapse: Secondary | ICD-10-CM | POA: Insufficient documentation

## 2015-11-09 DIAGNOSIS — R002 Palpitations: Secondary | ICD-10-CM | POA: Diagnosis not present

## 2015-11-09 DIAGNOSIS — Z87891 Personal history of nicotine dependence: Secondary | ICD-10-CM | POA: Insufficient documentation

## 2015-11-09 DIAGNOSIS — Z8572 Personal history of non-Hodgkin lymphomas: Secondary | ICD-10-CM | POA: Insufficient documentation

## 2015-11-09 DIAGNOSIS — I340 Nonrheumatic mitral (valve) insufficiency: Secondary | ICD-10-CM | POA: Diagnosis not present

## 2015-11-09 DIAGNOSIS — I059 Rheumatic mitral valve disease, unspecified: Secondary | ICD-10-CM | POA: Diagnosis present

## 2015-11-09 NOTE — Progress Notes (Signed)
Quick Note:  Good news. Echocardiogram is stable. Ejection fraction is the same. Much valve prolapse persists with moderate regurgitation, this is stable over last 2 years.  We should monitor this every now and then to make sure that is not getting worse. But this is a good sign that is not changed.  Lisa Man, MD  ______

## 2015-11-11 ENCOUNTER — Telehealth: Payer: Self-pay | Admitting: *Deleted

## 2015-11-11 NOTE — Telephone Encounter (Signed)
LEFT MESSAGE- INFORMATION RELEASE TO MY CHART IN ANY QUESTION MAY CALL BACK

## 2015-11-11 NOTE — Telephone Encounter (Signed)
-----   Message from Leonie Man, MD sent at 11/09/2015  6:50 PM EDT ----- Good news. Echocardiogram is stable. Ejection fraction is the same. Much valve prolapse persists with moderate regurgitation, this is stable over last 2 years.  We should monitor this every now and then to make sure that is not getting worse. But this is a good sign that is not changed.  Leonie Man, MD

## 2015-11-13 ENCOUNTER — Other Ambulatory Visit: Payer: Self-pay | Admitting: Family Medicine

## 2015-11-24 ENCOUNTER — Telehealth: Payer: Managed Care, Other (non HMO) | Admitting: Nurse Practitioner

## 2015-11-24 DIAGNOSIS — R05 Cough: Secondary | ICD-10-CM

## 2015-11-24 DIAGNOSIS — R059 Cough, unspecified: Secondary | ICD-10-CM

## 2015-11-24 MED ORDER — AZITHROMYCIN 250 MG PO TABS: ORAL_TABLET | ORAL | Status: DC

## 2015-11-24 MED ORDER — BENZONATATE 100 MG PO CAPS: 100.0000 mg | ORAL_CAPSULE | Freq: Three times a day (TID) | ORAL | Status: DC | PRN

## 2015-11-24 NOTE — Progress Notes (Signed)

## 2016-01-25 ENCOUNTER — Telehealth: Payer: Managed Care, Other (non HMO) | Admitting: Family

## 2016-01-25 DIAGNOSIS — L237 Allergic contact dermatitis due to plants, except food: Secondary | ICD-10-CM

## 2016-01-25 MED ORDER — METHYLPREDNISOLONE 4 MG PO TBPK: ORAL_TABLET | ORAL | Status: DC

## 2016-01-25 NOTE — Progress Notes (Signed)

## 2016-02-11 ENCOUNTER — Other Ambulatory Visit: Payer: Self-pay | Admitting: Family Medicine

## 2016-03-09 ENCOUNTER — Other Ambulatory Visit (HOSPITAL_COMMUNITY): Payer: Self-pay | Admitting: Psychiatry

## 2016-03-31 ENCOUNTER — Other Ambulatory Visit (HOSPITAL_COMMUNITY): Payer: Self-pay | Admitting: Psychiatry

## 2016-05-04 ENCOUNTER — Telehealth: Payer: Managed Care, Other (non HMO) | Admitting: Physician Assistant

## 2016-05-04 DIAGNOSIS — J069 Acute upper respiratory infection, unspecified: Secondary | ICD-10-CM

## 2016-05-04 DIAGNOSIS — B9789 Other viral agents as the cause of diseases classified elsewhere: Secondary | ICD-10-CM

## 2016-05-04 NOTE — Progress Notes (Signed)
E visit for Flu like symptoms   We are sorry that you are not feeling well.  Here is how we plan to help! Based on what you have shared with me it looks like you may have a viral upper respiratory infection. Could potentially be the flu but typically accompanied with a fever > 101. Either way with symptoms > 2 days, we are outside the window where antivirals (Tamiflu) will help. Please read the instructions below for symptomatic treatment.   Influenza or "the flu" is   an infection caused by a respiratory virus. The flu virus is highly contagious and persons who did not receive their yearly flu vaccination may "catch" the flu from close contact.  We have anti-viral medications to treat the viruses that cause this infection. They are not a "cure" and only shorten the course of the infection. These prescriptions are most effective when they are given within the first 2 days of "flu" symptoms. Antiviral medication are indicated if you have a high risk of complications from the flu. You should  also consider an antiviral medication if you are in close contact with someone who is at risk. These medications can help patients avoid complications from the flu  but have side effects that you should know. Possible side effects from Tamiflu or oseltamivir include nausea, vomiting, diarrhea, dizziness, headaches, eye redness, sleep problems or other respiratory symptoms. You should not take Tamiflu if you have an allergy to oseltamivir or any to the ingredients in Tamiflu.  Based upon your symptoms and potential risk factors I recommend that you follow the flu symptoms recommendation that I have listed below.  ANYONE WHO HAS FLU SYMPTOMS SHOULD: . Stay home. The flu is highly contagious and going out or to work exposes others! . Be sure to drink plenty of fluids. Water is fine as well as fruit juices, sodas and electrolyte beverages. You may want to stay away from caffeine or alcohol. If you are nauseated, try  taking small sips of liquids. How do you know if you are getting enough fluid? Your urine should be a pale yellow or almost colorless. . Get rest. . Taking a steamy shower or using a humidifier may help nasal congestion and ease sore throat pain. Using a saline nasal spray works much the same way. . Cough drops, hard candies and sore throat lozenges may ease your cough. . Line up a caregiver. Have someone check on you regularly.   GET HELP RIGHT AWAY IF: . You cannot keep down liquids or your medications. . You become short of breath . Your fell like you are going to pass out or loose consciousness. . Your symptoms persist after you have completed your treatment plan MAKE SURE YOU   Understand these instructions.  Will watch your condition.  Will get help right away if you are not doing well or get worse.  Your e-visit answers were reviewed by a board certified advanced clinical practitioner to complete your personal care plan.  Depending on the condition, your plan could have included both over the counter or prescription medications.  If there is a problem please reply  once you have received a response from your provider.  Your safety is important to [place].  If you have drug allergies check your prescription carefully.    You can use MyChart to ask questions about today's visit, request a non-urgent call back, or ask for a work or school excuse for 24 hours related to this e-Visit. If it  has been greater than 24 hours you will need to follow up with your provider, or enter a new e-Visit to address those concerns.  You will get an e-mail in the next two days asking about your experience.  I hope that your e-visit has been valuable and will speed your recovery. Thank you for using e-visits.

## 2016-05-06 ENCOUNTER — Other Ambulatory Visit: Payer: Self-pay | Admitting: Medical

## 2016-05-06 ENCOUNTER — Other Ambulatory Visit (HOSPITAL_COMMUNITY): Payer: Self-pay | Admitting: Psychiatry

## 2016-05-08 ENCOUNTER — Other Ambulatory Visit: Payer: Self-pay | Admitting: Medical

## 2016-05-08 ENCOUNTER — Telehealth: Payer: Self-pay | Admitting: Medical

## 2016-05-08 MED ORDER — SYNTHROID 88 MCG PO TABS: ORAL_TABLET | ORAL | 0 refills | Status: DC

## 2016-05-08 MED ORDER — SERTRALINE HCL 100 MG PO TABS: 100.0000 mg | ORAL_TABLET | Freq: Every day | ORAL | 0 refills | Status: DC

## 2016-05-08 NOTE — Telephone Encounter (Signed)
ALERT PT DID MAKE A CPE APPT. Pt scheduled a cpe for Nov. Please fill pt's synthroid and zoloft. Pt uses [facility] and can be reached at [phone].

## 2016-05-10 ENCOUNTER — Telehealth: Payer: Managed Care, Other (non HMO) | Admitting: Nurse Practitioner

## 2016-05-10 DIAGNOSIS — R112 Nausea with vomiting, unspecified: Secondary | ICD-10-CM

## 2016-05-10 MED ORDER — ONDANSETRON 4 MG PO TBDP: 4.0000 mg | ORAL_TABLET | Freq: Three times a day (TID) | ORAL | 0 refills | Status: DC | PRN

## 2016-05-10 NOTE — Progress Notes (Signed)

## 2016-05-11 ENCOUNTER — Ambulatory Visit (INDEPENDENT_AMBULATORY_CARE_PROVIDER_SITE_OTHER): Payer: Managed Care, Other (non HMO) | Admitting: Medical

## 2016-05-11 ENCOUNTER — Encounter: Payer: Self-pay | Admitting: Medical

## 2016-05-11 VITALS — BP 140/88 | HR 100 | Temp 98.3°F | Resp 16 | Ht 67.0 in | Wt 178.2 lb

## 2016-05-11 DIAGNOSIS — E038 Other specified hypothyroidism: Secondary | ICD-10-CM | POA: Diagnosis not present

## 2016-05-11 DIAGNOSIS — R109 Unspecified abdominal pain: Secondary | ICD-10-CM

## 2016-05-11 DIAGNOSIS — R110 Nausea: Secondary | ICD-10-CM | POA: Diagnosis not present

## 2016-05-11 LAB — POCT URINALYSIS DIPSTICK
Bilirubin, UA: NEGATIVE
Blood, UA: NEGATIVE
Glucose, UA: NEGATIVE
KETONES UA: NEGATIVE
LEUKOCYTES UA: NEGATIVE
Nitrite, UA: NEGATIVE
PROTEIN UA: NEGATIVE
Spec Grav, UA: 1.01
Urobilinogen, UA: NEGATIVE
pH, UA: 7

## 2016-05-11 LAB — CBC WITH DIFFERENTIAL/PLATELET
BASOS ABS: 57 {cells}/uL (ref 0–200)
Basophils Relative: 1 %
EOS ABS: 342 {cells}/uL (ref 15–500)
Eosinophils Relative: 6 %
HEMATOCRIT: 38 % (ref 35.0–45.0)
HEMOGLOBIN: 12.6 g/dL (ref 11.7–15.5)
LYMPHS ABS: 1767 {cells}/uL (ref 850–3900)
Lymphocytes Relative: 31 %
MCH: 30.6 pg (ref 27.0–33.0)
MCHC: 33.2 g/dL (ref 32.0–36.0)
MCV: 92.2 fL (ref 80.0–100.0)
MONOS PCT: 7 %
MPV: 8.4 fL (ref 7.5–12.5)
Monocytes Absolute: 399 cells/uL (ref 200–950)
NEUTROS ABS: 3135 {cells}/uL (ref 1500–7800)
Neutrophils Relative %: 55 %
Platelets: 289 10*3/uL (ref 140–400)
RBC: 4.12 MIL/uL (ref 3.80–5.10)
RDW: 13.5 % (ref 11.0–15.0)
WBC: 5.7 10*3/uL (ref 4.0–10.5)

## 2016-05-11 LAB — BASIC METABOLIC PANEL
BUN: 9 mg/dL (ref 7–25)
CHLORIDE: 107 mmol/L (ref 98–110)
CO2: 25 mmol/L (ref 20–31)
CREATININE: 0.86 mg/dL (ref 0.50–1.10)
Calcium: 9.2 mg/dL (ref 8.6–10.2)
Glucose, Bld: 90 mg/dL (ref 65–99)
Potassium: 4.2 mmol/L (ref 3.5–5.3)
Sodium: 139 mmol/L (ref 135–146)

## 2016-05-11 LAB — HEPATIC FUNCTION PANEL
ALK PHOS: 45 U/L (ref 33–115)
ALT: 8 U/L (ref 6–29)
AST: 16 U/L (ref 10–30)
Albumin: 4 g/dL (ref 3.6–5.1)
BILIRUBIN DIRECT: 0.1 mg/dL (ref <=0.2)
BILIRUBIN INDIRECT: 0.4 mg/dL (ref 0.2–1.2)
Total Bilirubin: 0.5 mg/dL (ref 0.2–1.2)
Total Protein: 6.8 g/dL (ref 6.1–8.1)

## 2016-05-11 LAB — TSH: TSH: 2.43 mIU/L

## 2016-05-11 LAB — LIPASE: Lipase: 15 U/L (ref 7–60)

## 2016-05-11 LAB — T4, FREE: Free T4: 1.4 ng/dL (ref 0.8–1.8)

## 2016-05-11 MED ORDER — HYDROCODONE-ACETAMINOPHEN 5-325 MG PO TABS: 1.0000 | ORAL_TABLET | Freq: Four times a day (QID) | ORAL | 0 refills | Status: DC | PRN

## 2016-05-11 NOTE — Patient Instructions (Signed)
Encounter Diagnoses  Name Primary?  . Abdominal pain, unspecified abdominal location Yes  . Nausea   . Other specified hypothyroidism    Recommendations:  I suspect the symptoms could represent gastritis or pancreatitis.   Until we call with labs, only use clear fluids, no solid food  Preferably no solid food today or tomorrow  You can use Zofran you have at home, every 6 hours as needed for the nausea  You can use Omeprazole 40mg  you have at home, twice daily the next several days  NO ALCOHOL the next several days, and in general limit alcohol to 1 drink or less daily, no more than 2 on a given weekend day  You can use the medication prescribed today for pain  If much worse in the next 2-3 days, call or recheck  Rest  We will call with labs as soon as we get results, hopefully this afternoon    Acute Pancreatitis Acute pancreatitis is a disease in which the pancreas becomes suddenly inflamed. The pancreas is a large gland located behind your stomach. The pancreas produces enzymes that help digest food. The pancreas also releases the hormones glucagon and insulin that help regulate blood sugar. Damage to the pancreas occurs when the digestive enzymes from the pancreas are activated and begin attacking the pancreas before being released into the intestine. Most acute attacks last a couple of days and can cause serious complications. Some people become dehydrated and develop low blood pressure. In severe cases, bleeding into the pancreas can lead to shock and can be life-threatening. The lungs, heart, and kidneys may fail. CAUSES  Pancreatitis can happen to anyone. In some cases, the cause is unknown. Most cases are caused by:  Alcohol abuse.  Gallstones. Other less common causes are:  Certain medicines.  Exposure to certain chemicals.  Infection.  Damage caused by an accident (trauma).  Abdominal surgery. SYMPTOMS   Pain in the upper abdomen that may radiate to the  back.  Tenderness and swelling of the abdomen.  Nausea and vomiting. DIAGNOSIS  Your caregiver will perform a physical exam. Blood and stool tests may be done to confirm the diagnosis. Imaging tests may also be done, such as X-rays, CT scans, or an ultrasound of the abdomen. TREATMENT  Treatment usually requires a stay in the hospital. Treatment may include:  Pain medicine.  Fluid replacement through an intravenous line (IV).  Placing a tube in the stomach to remove stomach contents and control vomiting.  Not eating for 3 or 4 days. This gives your pancreas a rest, because enzymes are not being produced that can cause further damage.  Antibiotic medicines if your condition is caused by an infection.  Surgery of the pancreas or gallbladder. HOME CARE INSTRUCTIONS   Follow the diet advised by your caregiver. This may involve avoiding alcohol and decreasing the amount of fat in your diet.  Eat smaller, more frequent meals. This reduces the amount of digestive juices the pancreas produces.  Drink enough fluids to keep your urine clear or pale yellow.  Only take over-the-counter or prescription medicines as directed by your caregiver.  Avoid drinking alcohol if it caused your condition.  Do not smoke.  Get plenty of rest.  Check your blood sugar at home as directed by your caregiver.  Keep all follow-up appointments as directed by your caregiver. SEEK MEDICAL CARE IF:   You do not recover as quickly as expected.  You develop new or worsening symptoms.  You have persistent pain,  weakness, or nausea.  You recover and then have another episode of pain. SEEK IMMEDIATE MEDICAL CARE IF:   You are unable to eat or keep fluids down.  Your pain becomes severe.  You have a fever or persistent symptoms for more than 2 to 3 days.  You have a fever and your symptoms suddenly get worse.  Your skin or the white part of your eyes turn yellow (jaundice).  You develop  vomiting.  You feel dizzy, or you faint.  Your blood sugar is high (over 300 mg/dL). MAKE SURE YOU:   Understand these instructions.  Will watch your condition.  Will get help right away if you are not doing well or get worse.   This information is not intended to replace advice given to you by your health care provider. Make sure you discuss any questions you have with your health care provider.   Document Released: 07/03/2005 Document Revised: 01/02/2012 Document Reviewed: 10/12/2011 Elsevier Interactive Patient Education 2016 [employer]     Gastritis, Adult Gastritis is soreness and swelling (inflammation) of the lining of the stomach. Gastritis can develop as a sudden onset (acute) or long-term (chronic) condition. If gastritis is not treated, it can lead to stomach bleeding and ulcers. CAUSES  Gastritis occurs when the stomach lining is weak or damaged. Digestive juices from the stomach then inflame the weakened stomach lining. The stomach lining may be weak or damaged due to viral or bacterial infections. One common bacterial infection is the Helicobacter pylori infection. Gastritis can also result from excessive alcohol consumption, taking certain medicines, or having too much acid in the stomach.  SYMPTOMS  In some cases, there are no symptoms. When symptoms are present, they may include:  Pain or a burning sensation in the upper abdomen.  Nausea.  Vomiting.  An uncomfortable feeling of fullness after eating. DIAGNOSIS  Your caregiver may suspect you have gastritis based on your symptoms and a physical exam. To determine the cause of your gastritis, your caregiver may perform the following:  Blood or stool tests to check for the H pylori bacterium.  Gastroscopy. A thin, flexible tube (endoscope) is passed down the esophagus and into the stomach. The endoscope has a light and camera on the end. Your caregiver uses the endoscope to view the inside of the  stomach.  Taking a tissue sample (biopsy) from the stomach to examine under a microscope. TREATMENT  Depending on the cause of your gastritis, medicines may be prescribed. If you have a bacterial infection, such as an H pylori infection, antibiotics may be given. If your gastritis is caused by too much acid in the stomach, H2 blockers or antacids may be given. Your caregiver may recommend that you stop taking aspirin, ibuprofen, or other nonsteroidal anti-inflammatory drugs (NSAIDs). HOME CARE INSTRUCTIONS  Only take over-the-counter or prescription medicines as directed by your caregiver.  If you were given antibiotic medicines, take them as directed. Finish them even if you start to feel better.  Drink enough fluids to keep your urine clear or pale yellow.  Avoid foods and drinks that make your symptoms worse, such as:  Caffeine or alcoholic drinks.  Chocolate.  Peppermint or mint flavorings.  Garlic and onions.  Spicy foods.  Citrus fruits, such as oranges, lemons, or limes.  Tomato-based foods such as sauce, chili, salsa, and pizza.  Fried and fatty foods.  Eat small, frequent meals instead of large meals. SEEK IMMEDIATE MEDICAL CARE IF:   You have black or dark  red stools.  You vomit blood or material that looks like coffee grounds.  You are unable to keep fluids down.  Your abdominal pain gets worse.  You have a fever.  You do not feel better after 1 week.  You have any other questions or concerns. MAKE SURE YOU:  Understand these instructions.  Will watch your condition.  Will get help right away if you are not doing well or get worse.   This information is not intended to replace advice given to you by your health care provider. Make sure you discuss any questions you have with your health care provider.   Document Released: 06/27/2001 Document Revised: 01/02/2012 Document Reviewed: 08/16/2011 Elsevier Interactive Patient Education [employer].

## 2016-05-11 NOTE — Progress Notes (Signed)
Subjective: Chief Complaint  Patient presents with  . Abdominal Pain    dull aching pain on left side that started over the weekend.    Here for abdominal pain.   She notes several day hx/o dull achy pain left sided upper abdominal pain, hurts to breath deep too. Pain has moved some to the middle.     Has hx/o GERD and ulcers in mouth and stomach in remote past that cleared up with thalomide.     Drinks alcohol occasionally.  Last alcohol this past Sunday, probably 2 mixed drinks and 4 beers throughout the day this past Sunday.  Drinks a good amount on the weekends.  No hx/o pancreatitis, but can't recall.  Has nausea, but no vomiting.  No recent diarrhea.  No unusual stool colors.  Has daily normal BM.   Had normal BM yesterday.  Hasn't had BM today.   No blood in stool.   No urinary c/o, no fever.  Has some back pain in lower middle.  The back pain may or may not be related to the back pain.  Has been eating a lot of soup, but no particular acid or spicy foods of late.  Just came off period in last 2 days, but no pelvic pain.   No other aggravating or relieving factors. No other complaint.   Past Medical History:  Diagnosis Date  . Anxiety   . Asthma   . COPD (chronic obstructive pulmonary disease) (HCC)    COPD changes with right upper lobe scarring; vs. post radiation changes  . Depression   . GERD (gastroesophageal reflux disease)   . H/O amaurosis fugax Fall 2012   MRI of brain/brain stem: no acute abnormality, small area of encephalomalacia left superior cerebellum that could be prior trauma, infection, or lacunar infarct  . History of Hodgkin's lymphoma 2007   in remission, sees [facility], Dr. Marcell Anger; prior chemotherapy and radiation therapy; sees yearly in December  . History of mammogram 12/2012   normal  . Hypothyroidism   . Migraine    associated ocular symptoms, Dr. Lethea Killings, [facility]  . Moderate mitral regurgitation by prior echocardiogram 09/2010   See above  . MVP  (mitral valve prolapse) March 2012   Dr. Ellyn Hack, [facility]; a) Echo 3/'12: Mod MR; EF 60-65%; b) TEE 5/'12: Nl LV Fxn, EF 60-65%, mild, holosystolic prolapse of the medial anterior leaflet. Mod MR . c) Echo 3/'15: Moderate, holosystolic prolapse of  medial. Mod MR d) TM Stress Echo (@ [facility]) Nl Lv Fxn, Mild-Mod MR @ Res0 --> Mod MR at HR 181 bpm (7:22 min, 10.10 METS) w/p WMA  . Oral ulcer   . Palpitations 4/14   holter monitor, Dr. Ellyn Hack; no arrhythmia   . Reactive airway disease with wheezing    PFTs March 2014: mild obstructive defect, normal volumes, normal diffusion, +response to bronchodilator, FEV1 96% predicted, FEV1/FVC 69%; Pulmonology consult 4/14, thought to be related to non-selective beta blockers - she had these symptoms before beta blockers.  Pindolol was changed to Bystolic   Current Outpatient Prescriptions on File Prior to Visit  Medication Sig Dispense Refill  . ondansetron (ZOFRAN ODT) 4 MG disintegrating tablet Take 1 tablet (4 mg total) by mouth every 8 (eight) hours as needed for nausea or vomiting. 20 tablet 0  . sertraline (ZOLOFT) 100 MG tablet Take 1 tablet (100 mg total) by mouth daily. 90 tablet 0  . SYNTHROID 88 MCG tablet TAKE 1 TABLET (88 MCG TOTAL) BY MOUTH DAILY. [place]  tablet 0  . THALOMID 50 MG capsule Take 50 mg by mouth at bedtime.     . SUMAtriptan (IMITREX) 100 MG tablet TAKE 1 TABLET BY MOUTH EVERY 2 HOURS AS NEEDED FOR MIGRAINE. DO NOT EXCEED MORE THAN 2 TABLETS PER DAY. (Patient not taking: Reported on 05/11/2016) 10 tablet 5   No current facility-administered medications on file prior to visit.     Past Surgical History:  Procedure Laterality Date  . APPENDECTOMY  1993  . BREAST ENHANCEMENT SURGERY    . ESOPHAGOGASTRODUODENOSCOPY  04/11/13   [facility] Dr. Collene Mares  . INSERTION CENTRAL VENOUS ACCESS DEVICE W/ SUBCUTANEOUS PORT  2006  . LYMPH NODE BIOPSY  2006  . NM MYOCAR PERF WALL MOTION  12/2010   persantine myoview - moderate breast  attenuation (fixed mid-distal anterior defect), EF 68%, low risk scan  . RHINOPLASTY  2007   deviated septum  . TEE WITHOUT CARDIOVERSION  11/2010   Nl LV Fxn, EF 60-65%, mild, holosystolic prolapse of the medial anterior leaflet. Mod MR .   Marland Kitchen TRANSTHORACIC ECHOCARDIOGRAM  09/30/2012; 09/15/2013   LVEF 60-65%, wall motion normal, LV function normal, moderate holosystolic MVP (medial the middle scallop of the posterior leaflet), moderate regurgitation (directed posteriorly), no shunt -- stable over 2 years   ROS as in subjective   Objective: BP 140/88 (BP Location: Left Arm, Patient Position: Sitting, Cuff Size: Normal)   Pulse 100   Temp 98.3 F (36.8 C) (Tympanic)   Resp 16   Ht 5\' 7"  (1.702 m)   Wt 178 lb 3.2 oz (80.8 kg)   LMP 05/08/2016   BMI 27.91 kg/m   General appearance: alert, no distress, WD/WN, appears in some pain Oral cavity: MMM, no lesions Neck: supple, no lymphadenopathy, no thyromegaly, no masses Heart: RRR, normal S1, S2, no murmurs Lungs: CTA bilaterally, no wheezes, rhonchi, or rales Abdomen: +bs, soft, RLQ surgical scar, tender left side in general, worse left upper abdomen tenderness standing, some suprapubic and central abdominal tenderness, otherwise non tender, non distended, no masses, no hepatomegaly, no splenomegaly Back: nontender Pulses: 2+ symmetric, upper and lower extremities, normal cap refill Ext:no edema CN2-12 intact, no asterixis, nonfocal exam    Assessment: Encounter Diagnoses  Name Primary?  . Abdominal pain, unspecified abdominal location Yes  . Nausea   . Other specified hypothyroidism      Plan: abdominal pain, nausea- UA reviewed.  discussed possible causes.  I suspect gastritis vs pancreatitis.   She had significant alcohol consumption Sunday 4 days ago with 2+mixed drinks and 4 beers.   She has omeprazole and Zofran at home she will begin.  advised clear fluids only, no solid food intake today and tomorrow if possible.   Medication below for pain prn.  F/u pending labs.  Hypothyroidism - labs today since we are drawing labs for other.  Been on same dose without c/o in general.  Lisa Waters was seen today for abdominal pain.  Diagnoses and all orders for this visit:  Abdominal pain, unspecified abdominal location -     POCT Urinalysis Dipstick -     Basic metabolic panel -     CBC with Differential/Platelet -     TSH -     T4, free -     Hepatic function panel -     Lipase  Nausea -     Basic metabolic panel -     CBC with Differential/Platelet -     TSH -  T4, free -     Hepatic function panel -     Lipase  Other specified hypothyroidism  Other orders -     HYDROcodone-acetaminophen (NORCO) 5-325 MG tablet; Take 1 tablet by mouth every 6 (six) hours as needed for moderate pain.

## 2016-05-30 ENCOUNTER — Ambulatory Visit (INDEPENDENT_AMBULATORY_CARE_PROVIDER_SITE_OTHER): Payer: Managed Care, Other (non HMO) | Admitting: Medical

## 2016-05-30 ENCOUNTER — Encounter: Payer: Self-pay | Admitting: Medical

## 2016-05-30 VITALS — BP 120/70 | HR 72 | Ht 66.0 in | Wt 174.2 lb

## 2016-05-30 DIAGNOSIS — I7300 Raynaud's syndrome without gangrene: Secondary | ICD-10-CM | POA: Diagnosis not present

## 2016-05-30 DIAGNOSIS — J4520 Mild intermittent asthma, uncomplicated: Secondary | ICD-10-CM | POA: Diagnosis not present

## 2016-05-30 DIAGNOSIS — F411 Generalized anxiety disorder: Secondary | ICD-10-CM

## 2016-05-30 DIAGNOSIS — I340 Nonrheumatic mitral (valve) insufficiency: Secondary | ICD-10-CM | POA: Diagnosis not present

## 2016-05-30 DIAGNOSIS — E038 Other specified hypothyroidism: Secondary | ICD-10-CM

## 2016-05-30 DIAGNOSIS — K121 Other forms of stomatitis: Secondary | ICD-10-CM | POA: Diagnosis not present

## 2016-05-30 DIAGNOSIS — Z0000 Encounter for general adult medical examination without abnormal findings: Secondary | ICD-10-CM | POA: Diagnosis not present

## 2016-05-30 DIAGNOSIS — I341 Nonrheumatic mitral (valve) prolapse: Secondary | ICD-10-CM

## 2016-05-30 DIAGNOSIS — Z2821 Immunization not carried out because of patient refusal: Secondary | ICD-10-CM

## 2016-05-30 DIAGNOSIS — C8190 Hodgkin lymphoma, unspecified, unspecified site: Secondary | ICD-10-CM | POA: Diagnosis not present

## 2016-05-30 DIAGNOSIS — F3341 Major depressive disorder, recurrent, in partial remission: Secondary | ICD-10-CM

## 2016-05-30 LAB — POCT URINALYSIS DIPSTICK
BILIRUBIN UA: NEGATIVE
Blood, UA: NEGATIVE
Glucose, UA: NEGATIVE
KETONES UA: NEGATIVE
LEUKOCYTES UA: NEGATIVE
Nitrite, UA: NEGATIVE
PH UA: 6.5
Protein, UA: NEGATIVE
SPEC GRAV UA: 1.02
Urobilinogen, UA: NEGATIVE

## 2016-05-30 MED ORDER — SERTRALINE HCL 100 MG PO TABS: 100.0000 mg | ORAL_TABLET | Freq: Every day | ORAL | 3 refills | Status: DC

## 2016-05-30 MED ORDER — SUMATRIPTAN SUCCINATE 100 MG PO TABS: ORAL_TABLET | ORAL | 5 refills | Status: DC

## 2016-05-30 MED ORDER — SYNTHROID 88 MCG PO TABS: ORAL_TABLET | ORAL | 3 refills | Status: DC

## 2016-05-30 NOTE — Progress Notes (Signed)
Subjective: Chief Complaint  Patient presents with  . physical    phyical , no pap, no other concerns   Medical team: Dr. Ellyn Hack, cardiology Dr. Sharol Roussel, dermatology/immunology  Oncology at [facility], 2 different specialist Sees Breast Cancer as well at [facility] in collaboration with the other 2 oncologist Gynecology/fertility specialist, Dr. Sharlene Motts eye doctor and dentist Glade Lloyd, DAVID Audelia Acton, PA-C here for primary care  Concerns None, doing fine.   Is followed by cardiology for MVP which is stable per recent Echo Is followed by oncology at [facility] for hx/o hodkins disease, prior radiation, gets yearly mammogram Sees Dr. Linna Hoff for gyn/fertility.  Up to date on pap smear Chart hx/o asthma/COPD but no recent problems with asthma.  hans 't had to use inhaler in long time.  Gets a little wheezy from time to time.  No recent PFTs.  Former smoker  Past Medical History:  Diagnosis Date  . Anxiety   . Asthma   . Depression   . GERD (gastroesophageal reflux disease)   . H/O amaurosis fugax Fall 2012   MRI of brain/brain stem: no acute abnormality, small area of encephalomalacia left superior cerebellum that could be prior trauma, infection, or lacunar infarct  . History of Hodgkin's lymphoma 2007   in remission, sees [facility], Dr. Marcell Anger; prior chemotherapy and radiation therapy; sees yearly in December  . History of mammogram 12/2012   normal  . Hypothyroidism   . Migraine    associated ocular symptoms, Dr. Lethea Killings, [facility]  . Moderate mitral regurgitation by prior echocardiogram 09/2010   See above  . MVP (mitral valve prolapse) March 2012   Dr. Ellyn Hack, [facility]; a) Echo 3/'12: Mod MR; EF 60-65%; b) TEE 5/'12: Nl LV Fxn, EF 60-65%, mild, holosystolic prolapse of the medial anterior leaflet. Mod MR . c) Echo 3/'15: Moderate, holosystolic prolapse of  medial. Mod MR d) TM Stress Echo (@ [facility]) Nl Lv Fxn, Mild-Mod MR @ Res0 --> Mod MR at HR 181 bpm (7:22 min, 10.10  METS) w/p WMA  . Oral ulcer   . Palpitations 4/14   holter monitor, Dr. Ellyn Hack; no arrhythmia   . Reactive airway disease with wheezing    PFTs March 2014: mild obstructive defect, normal volumes, normal diffusion, +response to bronchodilator, FEV1 96% predicted, FEV1/FVC 69%; Pulmonology consult 4/14, thought to be related to non-selective beta blockers - she had these symptoms before beta blockers.  Pindolol was changed to Bystolic    Past Surgical History:  Procedure Laterality Date  . APPENDECTOMY  1993  . BREAST ENHANCEMENT SURGERY    . COLONOSCOPY     Dr. Collene Mares  . ESOPHAGOGASTRODUODENOSCOPY  04/11/13   [facility] Dr. Collene Mares  . INSERTION CENTRAL VENOUS ACCESS DEVICE W/ SUBCUTANEOUS PORT  2006  . LYMPH NODE BIOPSY  2006  . NM MYOCAR PERF WALL MOTION  12/2010   persantine myoview - moderate breast attenuation (fixed mid-distal anterior defect), EF 68%, low risk scan  . RHINOPLASTY  2007   deviated septum  . TEE WITHOUT CARDIOVERSION  11/2010   Nl LV Fxn, EF 60-65%, mild, holosystolic prolapse of the medial anterior leaflet. Mod MR .   Marland Kitchen TRANSTHORACIC ECHOCARDIOGRAM  09/30/2012; 09/15/2013   LVEF 60-65%, wall motion normal, LV function normal, moderate holosystolic MVP (medial the middle scallop of the posterior leaflet), moderate regurgitation (directed posteriorly), no shunt -- stable over 2 years    Social History   Social History  . Marital status: Married  Spouse name: N/A  . Number of children: 0  . Years of education: N/A   Occupational History  . works in [employer]  . Smoking status: Former Smoker    Packs/day: 1.00    Years: 10.00    Types: Cigarettes    Quit date: 11/07/2009  . Smokeless tobacco: Never Used  . Alcohol use 0.6 - 1.2 oz/week    1 - 2 Cans of beer per week  . Drug use: No  . Sexual activity: Yes    Partners: Male    Birth control/ protection: None   Other Topics Concern  . Not on file    Social History Narrative   Married, 0 children, has failed fertility treatment prior. 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   Formerly was Office manager at [employer], exercise - active in the business,  Architect.  05/2016    Family History  Problem Relation Age of Onset  . Skin cancer Father   . Cancer Paternal Grandmother     metastasis  . Alcohol abuse Mother   . Emphysema Paternal Grandfather     was a smoker, heart problems  . Diabetes Maternal Aunt   . OCD Maternal Aunt   . Stroke Maternal Grandmother   . Bipolar disorder Maternal Aunt   . Heart disease Neg Hx   . Hypertension Neg Hx   . Hyperlipidemia Neg Hx      Current Outpatient Prescriptions:  .  sertraline (ZOLOFT) 100 MG tablet, Take 1 tablet (100 mg total) by mouth daily., Disp: 90 tablet, Rfl: 3 .  SUMAtriptan (IMITREX) 100 MG tablet, TAKE 1 TABLET BY MOUTH EVERY 2 HOURS AS NEEDED FOR MIGRAINE. DO NOT EXCEED MORE THAN 2 TABLETS PER DAY., Disp: 10 tablet, Rfl: 5 .  SYNTHROID 88 MCG tablet, TAKE 1 TABLET (88 MCG TOTAL) BY MOUTH DAILY., Disp: 90 tablet, Rfl: 3  Allergies  Allergen Reactions  . Codeine Itching  . Fish Allergy     Mahi Mahi  . Iohexol Hives  . Pindolol     Avoid non selective beta blockers due to dyspnea  . Iodinated Diagnostic Agents Rash    Has received since rash developed without pre-meds without development of rash.    Review of Systems Constitutional: -fever, -chills, -sweats, -unexpected weight change, -decreased appetite, -fatigue Allergy: -sneezing, -itching, -congestion Dermatology: -changing moles, --rash, -lumps ENT: -runny nose, -ear pain, -sore throat, -hoarseness, -sinus pain, -teeth pain, - ringing in ears, -hearing loss, -nosebleeds Cardiology: -chest pain, -palpitations, -swelling, -difficulty breathing when lying flat, -waking up short of breath Respiratory: -cough, -shortness of breath, -difficulty breathing with exercise or exertion,  -wheezing, -coughing up blood Gastroenterology: -abdominal pain, -nausea, -vomiting, -diarrhea, -constipation, -blood in stool, -changes in bowel movement, -difficulty swallowing or eating Hematology: -bleeding, -bruising  Musculoskeletal: -joint aches, -muscle aches, -joint swelling, -back pain, -neck pain, -cramping, -changes in gait Ophthalmology: denies vision changes, eye redness, itching, discharge Urology: -burning with urination, -difficulty urinating, -blood in urine, -urinary frequency, -urgency, -incontinence Neurology: -headache, -weakness, -tingling, -numbness, -memory loss, -falls, -dizziness Psychology: -depressed mood, -agitation, -sleep problems     Objective:   Physical Exam  BP 120/70   Pulse 72   Ht 5\' 6"  (1.676 m)   Wt 174 lb 3.2 oz (79 kg)   LMP 05/08/2016   SpO2 98%   BMI 28.12 kg/m   General appearance: alert, no distress, WD/WN, white female Skin: scattered macules, no  worrisome lesions, otherwise warm, dry, whole left arm sleeve tattoo, tattoo low back HEENT: normocephalic, conjunctiva/corneas normal, sclerae anicteric, PERRLA, EOMi, nares patent, no discharge or erythema, pharynx normal Oral cavity: MMM, tongue normal, teeth in good repair Neck: supple, no lymphadenopathy, no thyromegaly, no masses, normal ROM, no bruits Chest: non tender, normal shape and expansion Heart: RRR, normal S1, S2, no murmurs Lungs: CTA bilaterally, no wheezes, rhonchi, or rales Abdomen: +bs, soft, RLQ surgical scars, non tender, non distended, no masses, no hepatomegaly, no splenomegaly, no bruits Back: non tender, normal ROM, no scoliosis Musculoskeletal: upper extremities non tender, no obvious deformity, normal ROM throughout, lower extremities non tender, no obvious deformity, normal ROM throughout Extremities: no edema, no cyanosis, no clubbing Pulses: 2+ symmetric, upper and lower extremities, normal cap refill Neurological: alert, oriented x 3, CN2-12 intact, strength  normal upper extremities and lower extremities, sensation normal throughout, DTRs 2+ throughout, no cerebellar signs, gait normal Psychiatric: normal affect, behavior normal, pleasant  Breast/gyn/rectal - deferred to gynecology   Assessment and Plan :    Encounter Diagnoses  Name Primary?  . Routine general medical examination at a health care facility Yes  . Raynaud's phenomenon without gangrene   . MVP (mitral valve prolapse)   . Moderate mitral regurgitation by prior echocardiogram   . Mild intermittent asthma without complication   . Oral ulcer   . Other specified hypothyroidism   . Hodgkin's disease in remission ([place])   . Recurrent major depressive disorder, in partial remission ([place])   . Generalized anxiety disorder   . Influenza vaccination declined     Physical exam - discussed healthy lifestyle, diet, exercise, preventative care, vaccinations, and addressed their concerns.   See dentist, eye doctor and gyn yearly Raynaud's - consider procardia or other medication to help with symptoms.   Will check with cardiology (Dr. Ellyn Hack) on their recommendations too. MVP - reviewed recent Echo, stable Asthma - PFTs done today, mild restriction likely due to prior therapies for hodgkin's.   No current asthma symptoms of concern.    depression - doing fine on current medication She declines influenza vaccine today Hypothyroidism  - reviewed recent labs, c/t same medication Reviewed recent oncology notes in [place] most recent cardiology notes in chart record Reviewed health history, updated health history Will get copy of last colonoscopy, Dr. Collene Mares Follow-up yearly for physical  Lisa Waters was seen today for physical.  Diagnoses and all orders for this visit:  Routine general medical examination at a health care facility -     Urinalysis Dipstick -     Cancel: Spirometry with graph; Future  Raynaud's phenomenon without gangrene  MVP (mitral valve  prolapse)  Moderate mitral regurgitation by prior echocardiogram  Mild intermittent asthma without complication -     Spirometry with graph  Oral ulcer  Other specified hypothyroidism  Hodgkin's disease in remission ([place])  Recurrent major depressive disorder, in partial remission ([place])  Generalized anxiety disorder  Influenza vaccination declined  Other orders -     SYNTHROID 88 MCG tablet; TAKE 1 TABLET (88 MCG TOTAL) BY MOUTH DAILY. -     sertraline (ZOLOFT) 100 MG tablet; Take 1 tablet (100 mg total) by mouth daily. -     SUMAtriptan (IMITREX) 100 MG tablet; TAKE 1 TABLET BY MOUTH EVERY 2 HOURS AS NEEDED FOR MIGRAINE. DO NOT EXCEED MORE THAN 2 TABLETS PER DAY.

## 2016-06-15 ENCOUNTER — Telehealth: Payer: Self-pay | Admitting: Medical

## 2016-06-15 ENCOUNTER — Other Ambulatory Visit: Payer: Self-pay | Admitting: Medical

## 2016-06-15 MED ORDER — NIFEDIPINE ER OSMOTIC RELEASE 30 MG PO TB24: 30.0000 mg | ORAL_TABLET | Freq: Every day | ORAL | 1 refills | Status: DC

## 2016-06-15 NOTE — Telephone Encounter (Signed)
Have her give procardia a try, once daily BP medication but in her case for Raynauds.  Lets see if this helps.  Dr. Ellyn Hack was ok with this, but sorry I didn't get back to her sooner.  F/u in 3-4wk.   If dizzy, lightheaded, then stop the [facility] and let me know in the meantime.

## 2016-06-16 NOTE — Telephone Encounter (Signed)
LMTCB. /RLB 

## 2016-06-16 NOTE — Telephone Encounter (Signed)
Pt called back to  Gave her the info.

## 2016-06-30 ENCOUNTER — Telehealth: Payer: Self-pay

## 2016-06-30 NOTE — Telephone Encounter (Signed)
Colonoscopy report request placed in your folder for review.

## 2016-07-03 ENCOUNTER — Encounter: Payer: Self-pay | Admitting: Medical

## 2016-07-04 ENCOUNTER — Encounter: Payer: Self-pay | Admitting: Medical

## 2016-07-05 ENCOUNTER — Telehealth: Payer: Managed Care, Other (non HMO) | Admitting: Nurse Practitioner

## 2016-07-05 DIAGNOSIS — R05 Cough: Secondary | ICD-10-CM

## 2016-07-05 DIAGNOSIS — R059 Cough, unspecified: Secondary | ICD-10-CM

## 2016-07-05 MED ORDER — BENZONATATE 100 MG PO CAPS: 100.0000 mg | ORAL_CAPSULE | Freq: Three times a day (TID) | ORAL | 0 refills | Status: DC | PRN

## 2016-07-05 MED ORDER — AZITHROMYCIN 250 MG PO TABS: ORAL_TABLET | ORAL | 0 refills | Status: DC

## 2016-07-05 NOTE — Progress Notes (Signed)

## 2016-07-18 ENCOUNTER — Other Ambulatory Visit: Payer: Self-pay | Admitting: Medical

## 2016-07-18 ENCOUNTER — Other Ambulatory Visit: Payer: Self-pay

## 2016-07-18 ENCOUNTER — Telehealth: Payer: Self-pay

## 2016-07-18 MED ORDER — NIFEDIPINE ER OSMOTIC RELEASE 30 MG PO TB24: 30.0000 mg | ORAL_TABLET | Freq: Every day | ORAL | 3 refills | Status: DC

## 2016-07-18 NOTE — Telephone Encounter (Signed)
With the refill request I assume its helping the fingertips/raynaud's?  Is she having any problems with dizziness or feeling lightheaded?  I sent refills

## 2016-07-18 NOTE — Telephone Encounter (Signed)
Can pt have a refill on this 

## 2016-07-18 NOTE — Telephone Encounter (Signed)
Called andl/m for pt to call us back. 

## 2016-07-18 NOTE — Telephone Encounter (Signed)
Request rcvd for nifedipine refill to [facility] [address] Lisa Waters December

## 2016-07-20 ENCOUNTER — Other Ambulatory Visit: Payer: Self-pay | Admitting: Medical

## 2016-07-20 NOTE — Telephone Encounter (Signed)
Ok, if she got any benefit on the fingers, we can even try a lower dose or different medication.   Let me know.  I hate she felt lightheaded.

## 2016-07-20 NOTE — Telephone Encounter (Signed)
Spoke with pt she said that it might help cutting it in half  Or her trying something else. Which ever you think is best.

## 2016-07-20 NOTE — Telephone Encounter (Signed)
We would have to change to generic nifedipine instead of Nifedipine XL.  This would mean having to take it BID or TID though.   If she is willing to try this BID-TID, then I'll send the lowest dose.  Let me know

## 2016-07-20 NOTE — Telephone Encounter (Signed)
Spoke pt she doesn't need a refill on this med, this was sent in error , yes it made her lighthead  And she didn't light the way it made her feel.

## 2016-07-24 NOTE — Telephone Encounter (Signed)
She is willing to do it what mg?

## 2016-07-25 ENCOUNTER — Other Ambulatory Visit: Payer: Self-pay | Admitting: Medical

## 2016-07-25 ENCOUNTER — Telehealth: Payer: Managed Care, Other (non HMO) | Admitting: Family

## 2016-07-25 DIAGNOSIS — R059 Cough, unspecified: Secondary | ICD-10-CM

## 2016-07-25 DIAGNOSIS — J209 Acute bronchitis, unspecified: Secondary | ICD-10-CM

## 2016-07-25 DIAGNOSIS — R05 Cough: Secondary | ICD-10-CM

## 2016-07-25 MED ORDER — NIFEDIPINE 10 MG PO CAPS: 10.0000 mg | ORAL_CAPSULE | Freq: Two times a day (BID) | ORAL | 0 refills | Status: DC

## 2016-07-25 MED ORDER — PREDNISONE 10 MG (21) PO TBPK: 10.0000 mg | ORAL_TABLET | Freq: Every day | ORAL | 0 refills | Status: DC

## 2016-07-25 NOTE — Progress Notes (Signed)
We are sorry that you are not feeling well.  Here is how we plan to help!  Based on what you have shared with me it looks like you have upper respiratory tract inflammation that has resulted in a significant cough.  Inflammation and infection in the upper respiratory tract is commonly called bronchitis and has four common causes:  Allergies, Viral Infections, Acid Reflux and Bacterial Infections.  Allergies, viruses and acid reflux are treated by controlling symptoms or eliminating the cause. An example might be a cough caused by taking certain blood pressure medications. You stop the cough by changing the medication. Another example might be a cough caused by acid reflux. Controlling the reflux helps control the cough.  Based on your presentation I believe you most likely have A cough due to a virus.  This is called viral bronchitis and is best treated by rest, plenty of fluids and control of the cough.  You may use Ibuprofen or Tylenol as directed to help your symptoms.     In addition you may use A non-prescription cough medication called Robitussin DAC. Take 2 teaspoons every 8 hours or Delsym: take 2 teaspoons every 12 hours.  Sterapred 10 mg dosepak has been sent to the pharmacy.  USE OF BRONCHODILATOR ("RESCUE") INHALERS: There is a risk from using your bronchodilator too frequently.  The risk is that over-reliance on a medication which only relaxes the muscles surrounding the breathing tubes can reduce the effectiveness of medications prescribed to reduce swelling and congestion of the tubes themselves.  Although you feel brief relief from the bronchodilator inhaler, your asthma may actually be worsening with the tubes becoming more swollen and filled with mucus.  This can delay other crucial treatments, such as oral steroid medications. If you need to use a bronchodilator inhaler daily, several times per day, you should discuss this with your provider.  There are probably better treatments that  could be used to keep your asthma under control.     HOME CARE . Only take medications as instructed by your medical team. . Complete the entire course of an antibiotic. . Drink plenty of fluids and get plenty of rest. . Avoid close contacts especially the very young and the elderly . Cover your mouth if you cough or cough into your sleeve. . Always remember to wash your hands . A steam or ultrasonic humidifier can help congestion.   GET HELP RIGHT AWAY IF: . You develop worsening fever. . You become short of breath . You cough up blood. . Your symptoms persist after you have completed your treatment plan MAKE SURE YOU   Understand these instructions.  Will watch your condition.  Will get help right away if you are not doing well or get worse.  Your e-visit answers were reviewed by a board certified advanced clinical practitioner to complete your personal care plan.  Depending on the condition, your plan could have included both over the counter or prescription medications. If there is a problem please reply  once you have received a response from your provider. Your safety is important to [place].  If you have drug allergies check your prescription carefully.    You can use MyChart to ask questions about today's visit, request a non-urgent call back, or ask for a work or school excuse for 24 hours related to this e-Visit. If it has been greater than 24 hours you will need to follow up with your provider, or enter a new e-Visit to address those  concerns. You will get an e-mail in the next two days asking about your experience.  I hope that your e-visit has been valuable and will speed your recovery. Thank you for using e-visits.

## 2016-07-25 NOTE — Telephone Encounter (Signed)
Lets use a trial of generic Nifedipine capsules 10mg  BID.   Don't stop this abruptly.   Let me know within 2 WEEKS if she is tolerating this.  Have her call back to let us know!  (this is for Raynaud's, not hypertension)

## 2016-07-26 NOTE — Telephone Encounter (Signed)
Called l/m for pt call us back  

## 2016-07-27 NOTE — Telephone Encounter (Signed)
Pt was notified of this 

## 2016-08-07 ENCOUNTER — Other Ambulatory Visit: Payer: Self-pay | Admitting: Medical

## 2016-09-08 DIAGNOSIS — N6320 Unspecified lump in the left breast, unspecified quadrant: Secondary | ICD-10-CM | POA: Insufficient documentation

## 2016-09-20 ENCOUNTER — Other Ambulatory Visit: Payer: Self-pay | Admitting: Medical

## 2016-09-20 ENCOUNTER — Telehealth: Payer: Self-pay | Admitting: Medical

## 2016-09-20 MED ORDER — ALPRAZOLAM 0.5 MG PO TABS: 0.5000 mg | ORAL_TABLET | Freq: Every evening | ORAL | 0 refills | Status: DC | PRN

## 2016-09-20 NOTE — Telephone Encounter (Signed)
Pt called and is having a biopsy Friday at [facility] and they told her to call and request something for aniexty and some pain medicine after, states they wanted her primary care dr to prescribe it to her . [id number] [place], [place] - [address]. AT [address] pt can be reached at [phone]

## 2016-09-20 NOTE — Telephone Encounter (Signed)
Called pt to let her know.

## 2016-09-20 NOTE — Telephone Encounter (Signed)
Called and spoke with on lamor 's office about her needing pain meds after her biopsy , a message was put up to physician requesting pain meds after her biopsy on Friday. Also let her know that  generally  The physician whom is doing the biopsy would be the one  Give rx for pain meds,

## 2016-09-20 NOTE — Telephone Encounter (Signed)
forwarding

## 2016-09-20 NOTE — Telephone Encounter (Signed)
That sounds a little unusual.  I can call in short term anxiety medication to calm her nerves, but they have to determine what to give her for pain if needed.    Please have Tarsha call out Xanax.

## 2016-09-22 LAB — HM MAMMOGRAPHY

## 2016-09-25 ENCOUNTER — Other Ambulatory Visit: Payer: Self-pay | Admitting: Medical

## 2016-09-27 ENCOUNTER — Telehealth: Payer: Self-pay | Admitting: Medical

## 2016-09-27 NOTE — Telephone Encounter (Signed)
I received refill request on Xanax.   This was given recently short term for a procedure she was having.   Did she request this?   Find out the concern.

## 2016-09-27 NOTE — Telephone Encounter (Signed)
Called and lm for pt

## 2016-11-09 ENCOUNTER — Ambulatory Visit (INDEPENDENT_AMBULATORY_CARE_PROVIDER_SITE_OTHER): Payer: BLUE CROSS/BLUE SHIELD | Admitting: Cardiology

## 2016-11-09 ENCOUNTER — Encounter: Payer: Self-pay | Admitting: Cardiology

## 2016-11-09 VITALS — BP 108/73 | HR 76 | Ht 67.0 in | Wt 172.2 lb

## 2016-11-09 DIAGNOSIS — F3341 Major depressive disorder, recurrent, in partial remission: Secondary | ICD-10-CM | POA: Diagnosis not present

## 2016-11-09 DIAGNOSIS — I341 Nonrheumatic mitral (valve) prolapse: Secondary | ICD-10-CM | POA: Diagnosis not present

## 2016-11-09 DIAGNOSIS — I340 Nonrheumatic mitral (valve) insufficiency: Secondary | ICD-10-CM

## 2016-11-09 NOTE — Progress Notes (Signed)
PCP: Crisoforo Oxford, PA-C  Clinic Note: Chief Complaint  Patient presents with  . Follow-up    MVP & MR  . Shortness of Breath    occasionally.    HPI: Lisa Waters is a 36 y.o. female with a PMH below who presents today for Annual follow-up with moderate mitral regurgitation.[name] was last seen in April 2017  -- Echo checked showed stable MR. Doing OK - mostly dealing with chronic pain & MDD/GAD issues Recent Hospitalizations: n/a  Studies Reviewed:   2D Echo 10/2015: EF 55-60%. Normal WM. Normal D Fxn. Bilateral MVP w/ Mod MR (very eccentric). Normal LA, RV & RA. Normal PAP.  Interval History: Lisa Waters presents today for follow-up in great spirits - smiling & joking.  She says that she is doing "great".  Only notes dyspnea if she really exerts herself.  Nothing like the level of dyspnea when I first met her. No PND, orthopnea or edema.  No chest pain or shortness of breath with rest or exertion.  Only 2 episodes of short-lived "fluttering" in her chest described as an irregular flutter lasting only a few seconds, but can take away her breath.  Like someone picks up her heart & drops it.   No associated dizziness, or syncope/near syncope.  Just startles her. Otherwise no lightheadedness, dizziness, weakness or syncope/near syncope. No TIA/amaurosis fugax symptoms.  No claudication.  ROS: A comprehensive was performed. Review of Systems  Constitutional: Negative for malaise/fatigue.  HENT: Negative for nosebleeds.   Respiratory: Positive for cough (occasional - with allergies) and wheezing (worse with allergies).   Gastrointestinal: Negative for blood in stool, heartburn and melena.  Genitourinary: Negative for hematuria.  Musculoskeletal: Positive for joint pain (knees).  Neurological: Negative for dizziness, focal weakness and loss of consciousness.  Endo/Heme/Allergies: Positive for environmental allergies.  Psychiatric/Behavioral: Negative for depression  (well controlled). The patient is nervous/anxious (well controlled GAD). The patient does not have insomnia.   All other systems reviewed and are negative.  I have reviewed and (if needed) updated the patient's problem list, medications, allergies, past medical and surgical history, social and family history.  Past Medical History:  Diagnosis Date  . Anxiety   . Asthma   . Depression   . GERD (gastroesophageal reflux disease)   . H/O amaurosis fugax Fall 2012   MRI of brain/brain stem: no acute abnormality, small area of encephalomalacia left superior cerebellum that could be prior trauma, infection, or lacunar infarct  . History of Hodgkin's lymphoma 2007   in remission, sees [facility], Dr. Marcell Anger; prior chemotherapy and radiation therapy; sees yearly in December  . History of mammogram 12/2012   normal  . Hypothyroidism   . Migraine    associated ocular symptoms, Dr. Lethea Killings, [facility]  . Moderate mitral regurgitation by prior echocardiogram 09/2010   10/2015: EF 55-60%. Normal WM. Normal D Fxn. Bilateral MVP w/ Mod MR (very eccentric). Normal LA, RV & RA. Normal PAP.  Marland Kitchen MVP (mitral valve prolapse) March 2012   Dr. Ellyn Hack, [facility]; a) Echo 3/'12: Mod MR; EF 60-65%; b) TEE 5/'12: Nl LV Fxn, EF 60-65%, mild, holosystolic prolapse of the medial anterior leaflet. Mod MR . c) Echo 3/'15: Moderate, holosystolic prolapse of  medial. Mod MR d) TM Stress Echo (@ [facility]) Nl Lv Fxn, Mild-Mod MR @ Res0 --> Mod MR at HR 181 bpm (7:22 min, 10.10 METS) w/p WMA  . Oral ulcer   . Palpitations 4/14   holter monitor, Dr. Ellyn Hack;  no arrhythmia   . Reactive airway disease with wheezing    PFTs March 2014: mild obstructive defect, normal volumes, normal diffusion, +response to bronchodilator, FEV1 96% predicted, FEV1/FVC 69%; Pulmonology consult 4/14, thought to be related to non-selective beta blockers - she had these symptoms before beta blockers.  Pindolol was changed to Bystolic    Past Surgical History:    Procedure Laterality Date  . APPENDECTOMY  1993  . BREAST ENHANCEMENT SURGERY    . COLONOSCOPY     Dr. Collene Mares  . ESOPHAGOGASTRODUODENOSCOPY  04/11/13   [facility] Dr. Collene Mares  . INSERTION CENTRAL VENOUS ACCESS DEVICE W/ SUBCUTANEOUS PORT  2006  . LYMPH NODE BIOPSY  2006  . NM MYOCAR PERF WALL MOTION  12/2010   persantine myoview - moderate breast attenuation (fixed mid-distal anterior defect), EF 68%, low risk scan  . RHINOPLASTY  2007   deviated septum  . TEE WITHOUT CARDIOVERSION  11/2010   Nl LV Fxn, EF 60-65%, mild, holosystolic prolapse of the medial anterior leaflet. Mod MR .   Marland Kitchen TRANSTHORACIC ECHOCARDIOGRAM  3/'14; 3/'15   LVEF 60-65%, wall motion normal, LV function normal, moderate holosystolic MVP (medial the middle scallop of the posterior leaflet), moderate regurgitation (directed posteriorly), no shunt -- stable over 2 years  . TRANSTHORACIC ECHOCARDIOGRAM  10/2015   EF 55-60%. Normal WM. Normal D Fxn. Bilateral MVP w/ Mod MR (very eccentric). Normal LA, RV & RA. Normal PAP.    Current Meds  Medication Sig  . sertraline (ZOLOFT) 100 MG tablet Take 1 tablet (100 mg total) by mouth daily.  . SUMAtriptan (IMITREX) 100 MG tablet TAKE 1 TABLET BY MOUTH EVERY 2 HOURS AS NEEDED FOR MIGRAINE. DO NOT EXCEED MORE THAN 2 TABLETS PER DAY.  . SYNTHROID 88 MCG tablet TAKE 1 TABLET EVERY DAY    Allergies  Allergen Reactions  . Codeine Itching  . Fish Allergy     [place]  . Iohexol Hives  . Pindolol     Avoid non selective beta blockers due to dyspnea  . Iodinated Diagnostic Agents Rash    Has received since rash developed without pre-meds without development of rash.    Social History   Social History  . Marital status: Married    Spouse name: N/A  . Number of children: 0  . Years of education: N/A   Occupational History  . works in [place] Topics  . Smoking status: Former Smoker    Packs/day: 1.00    Years: 10.00     Types: Cigarettes    Quit date: 11/07/2009  . Smokeless tobacco: Never Used  . Alcohol use 0.6 - 1.2 oz/week    1 - 2 Cans of beer per week  . Drug use: No  . Sexual activity: Yes    Partners: Male    Birth control/ protection: None   Other Topics Concern  . None   Social History Narrative   Married, 0 children, has failed fertility treatment & has given up attempts at IVF as well as adoption. 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   Formerly was Office manager at [employer], exercise - active in the business,  Architect.  05/2016    family history includes Alcohol abuse in her mother; Bipolar disorder in her maternal aunt; Cancer in her paternal grandmother; Diabetes in her maternal aunt; Emphysema in her paternal grandfather; OCD in her maternal aunt; Skin cancer in her  father; Stroke in her maternal grandmother.  Wt Readings from Last 3 Encounters:  11/09/16 172 lb 3.2 oz (78.1 kg)  05/30/16 174 lb 3.2 oz (79 kg)  05/11/16 178 lb 3.2 oz (80.8 kg)    PHYSICAL EXAM BP 108/73   Pulse 76   Ht '5\' 7"'$  (1.702 m)   Wt 172 lb 3.2 oz (78.1 kg)   BMI 26.97 kg/m  General appearance: alert, cooperative, appears stated age, no distress and well nourished / well groomed. Healthy appearing.  In great spirits with pleasant mood & affect. Neck: no adenopathy, no carotid bruit and no JVD Lungs: clear to auscultation bilaterally, normal percussion bilaterally and non-labored Heart: regular rate and rhythm, S1 & S2 normal. No, click, rub or gallop; non-displaced PMI.  Blowing 3/6 HSM loudest @ apex (but heard throughout) & radiates to the back & axilla). Abdomen: soft, non-tender; bowel sounds normal; no masses,  no organomegaly; no HJR  Extremities: extremities normal, atraumatic, no cyanosis, or edema  Pulses: 2+ and symmetric;  Neurologic: Mental status: Alert, oriented, thought content appropriate    Adult ECG Report  Rate: 76 ;  Rhythm: normal sinus rhythm and ?  Left Post FSB (suspect abnormal limb lead positioning) ; normal intervals & durations  Narrative Interpretation: otherwise stable   Other studies Reviewed: Additional studies/ records that were reviewed today include:  Recent Labs:  Followed by PCP    ASSESSMENT / PLAN: Problem List Items Addressed This Visit    Major depression, recurrent ([place])    In great spirits today - stable.      Moderate mitral regurgitation by prior echocardiogram - Primary (Chronic)    Stable MR by echo in 10/2015 compared to 2015. Only notes DOE with significant exertion.  EF still preserved - if EF drops below 50%, will need to refer for MVR. BP well controlled - no antihypertensives necessary.  Continue to encourage exercise & activity.  Plan: recheck Echo Next year prior to f/u (2019)      Relevant Orders   EKG 12-Lead (Completed)   ECHOCARDIOGRAM COMPLETE   MVP (mitral valve prolapse) (Chronic)    Bileaflet MVP with Moderate MR by Echo last year with no real change in exam.  Reminded her of SBP prophylaxis for GI procedures & invasive oral procedures.      Relevant Orders   EKG 12-Lead (Completed)   ECHOCARDIOGRAM COMPLETE      Current medicines are reviewed at length with the patient today. (+/- concerns) n/a The following changes have been made: n/a  Patient Instructions  SCHEDULE [place]  in April 2019 Your physician has requested that you have an echocardiogram. Echocardiography is a painless test that uses sound waves to create images of your heart. It provides your doctor with information about the size and shape of your heart and how well your heart's chambers and valves are working. This procedure takes approximately one hour. There are no restrictions for this procedure.    No other changes with medications    Your physician wants you to follow-up in 12 months with DR Surafel Hilleary.You will receive a reminder letter in the mail two months in advance. If you don't  receive a letter, please call our office to schedule the follow-up appointment.  If you need a refill on your cardiac medications before your next appointment, please call your pharmacy.    Studies Ordered:   Orders Placed This Encounter  Procedures  . EKG 12-Lead  . ECHOCARDIOGRAM COMPLETE  Lorilyn Laitinen, M.D., M.S. Interventional Cardiologist   Pager # [phone] Phone # [phone] [address] [place], [place] [address] 

## 2016-11-09 NOTE — Patient Instructions (Signed)
SCHEDULE [place]  in April 2019 Your physician has requested that you have an echocardiogram. Echocardiography is a painless test that uses sound waves to create images of your heart. It provides your doctor with information about the size and shape of your heart and how well your heart's chambers and valves are working. This procedure takes approximately one hour. There are no restrictions for this procedure.    No other changes with medications    Your physician wants you to follow-up in 12 months with DR HARDING.You will receive a reminder letter in the mail two months in advance. If you don't receive a letter, please call our office to schedule the follow-up appointment.  If you need a refill on your cardiac medications before your next appointment, please call your pharmacy.

## 2016-11-10 ENCOUNTER — Encounter: Payer: Self-pay | Admitting: Cardiology

## 2016-11-10 NOTE — Assessment & Plan Note (Signed)
In great spirits today - stable.

## 2016-11-10 NOTE — Assessment & Plan Note (Signed)
Bileaflet MVP with Moderate MR by Echo last year with no real change in exam.  Reminded her of SBP prophylaxis for GI procedures & invasive oral procedures.

## 2016-11-10 NOTE — Assessment & Plan Note (Signed)
Stable MR by echo in 10/2015 compared to 2015. Only notes DOE with significant exertion.  EF still preserved - if EF drops below 50%, will need to refer for MVR. BP well controlled - no antihypertensives necessary.  Continue to encourage exercise & activity.  Plan: recheck Echo Next year prior to f/u (2019)

## 2016-11-14 ENCOUNTER — Other Ambulatory Visit: Payer: Self-pay | Admitting: Medical

## 2016-11-24 ENCOUNTER — Telehealth: Payer: Managed Care, Other (non HMO) | Admitting: Family

## 2016-11-24 DIAGNOSIS — T7840XS Allergy, unspecified, sequela: Secondary | ICD-10-CM

## 2016-11-24 MED ORDER — PREDNISONE 5 MG PO TABS: 5.0000 mg | ORAL_TABLET | ORAL | 0 refills | Status: DC

## 2016-11-24 MED ORDER — ALBUTEROL SULFATE HFA 108 (90 BASE) MCG/ACT IN AERS: 2.0000 | INHALATION_SPRAY | Freq: Four times a day (QID) | RESPIRATORY_TRACT | 2 refills | Status: DC | PRN

## 2016-11-24 MED ORDER — BENZONATATE 100 MG PO CAPS: 100.0000 mg | ORAL_CAPSULE | Freq: Three times a day (TID) | ORAL | 0 refills | Status: DC | PRN

## 2016-11-24 NOTE — Progress Notes (Signed)
E visit for Allergic Rhinitis We are sorry that you are not feeling well.  Her is how we plan to help!  Based on what you have shared with me it looks like you have Allergic Rhinitis.  Rhinitis is when a reaction occurs that causes nasal congestion, runny nose, sneezing, and itching.  Most types of rhinitis are caused by an inflammation and are associated with symptoms in the eyes ears or throat. There are several types of rhinitis.  The most common are acute rhinitis, which is usually caused by a viral illness, allergic or seasonal rhinitis, and nonallergic or year-round rhinitis.  Nasal allergies occur certain times of the year.  Allergic rhinitis is caused when allergens in the air trigger the release of histamine in the body.  Histamine causes itching, swelling, and fluid to build up in the fragile linings of the nasal passages, sinuses and eyelids.  An itchy nose and clear discharge are common.  I recommend the following over the counter treatments: Xyzal 5 mg take 1 tablet daily  I also would recommend a nasal spray: Flonase 2 sprays into each nostril once daily  You may also benefit from eye drops such as: Visine 1-2 drops each eye twice daily as needed   I have also prescribed an Albuterol inhaler, take 2 puffs every 6 hours for shortness of breath. Also, Tessalon Perles 100mg , take 1-2 capsules every 8 hours as needed for cough.  Optional: I have prescribed a steroid dose pack taper (6 day taper). I would say if your cough does not improve in the next 24h, you may use the steroid dose pack to help with inflammation in your lungs. There are some trade-offs to this as some people get restless and do not sleep well. However, it would help greatly if the other meds are not enough in 24 hours.   HOME CARE:   You can use an over-the-counter saline nasal spray as needed  Avoid areas where there is heavy dust, mites, or molds  Stay indoors on windy days during the pollen season  Keep  windows closed in home, at least in bedroom; use air conditioner.  Use high-efficiency house air filter  Keep windows closed in car, turn AC on re-circulate  Avoid playing out with dog during pollen season  GET HELP RIGHT AWAY IF:   If your symptoms do not improve within 10 days  You become short of breath  You develop yellow or green discharge from your nose for over 3 days  You have coughing fits  MAKE SURE YOU:   Understand these instructions  Will watch your condition  Will get help right away if you are not doing well or get worse  Thank you for choosing an e-visit. Your e-visit answers were reviewed by a board certified advanced clinical practitioner to complete your personal care plan. Depending upon the condition, your plan could have included both over the counter or prescription medications. Please review your pharmacy choice. Be sure that the pharmacy you have chosen is open so that you can pick up your prescription now.  If there is a problem you may message your provider in [place] to have the prescription routed to another pharmacy. Your safety is important to [place]. If you have drug allergies check your prescription carefully.  For the next 24 hours, you can use MyChart to ask questions about today's visit, request a non-urgent call back, or ask for a work or school excuse from your e-visit provider. You will  get an email in the next two days asking about your experience. I hope that your e-visit has been valuable and will speed your recovery.

## 2016-11-27 ENCOUNTER — Telehealth: Payer: Managed Care, Other (non HMO) | Admitting: Family

## 2016-11-27 DIAGNOSIS — B9689 Other specified bacterial agents as the cause of diseases classified elsewhere: Secondary | ICD-10-CM

## 2016-11-27 DIAGNOSIS — J028 Acute pharyngitis due to other specified organisms: Secondary | ICD-10-CM

## 2016-11-27 DIAGNOSIS — T7840XS Allergy, unspecified, sequela: Secondary | ICD-10-CM

## 2016-11-27 MED ORDER — PREDNISONE 5 MG PO TABS: 5.0000 mg | ORAL_TABLET | ORAL | 0 refills | Status: DC

## 2016-11-27 MED ORDER — AZITHROMYCIN 250 MG PO TABS: ORAL_TABLET | ORAL | 0 refills | Status: DC

## 2016-11-27 MED ORDER — BENZONATATE 100 MG PO CAPS: 100.0000 mg | ORAL_CAPSULE | Freq: Three times a day (TID) | ORAL | 0 refills | Status: DC | PRN

## 2016-11-27 NOTE — Progress Notes (Signed)
We are sorry that you are not feeling well.  Here is how we plan to help!  Based on what you have shared with me it looks like you have upper respiratory tract inflammation that has resulted in a significant cough.  Inflammation and infection in the upper respiratory tract is commonly called bronchitis and has four common causes:  Allergies, Viral Infections, Acid Reflux and Bacterial Infections.  Allergies, viruses and acid reflux are treated by controlling symptoms or eliminating the cause. An example might be a cough caused by taking certain blood pressure medications. You stop the cough by changing the medication. Another example might be a cough caused by acid reflux. Controlling the reflux helps control the cough.  Based on your presentation I believe you most likely have A cough due to bacteria.  When patients have a fever and a productive cough with a change in color or increased sputum production, we are concerned about bacterial bronchitis.  If left untreated it can progress to pneumonia.  If your symptoms do not improve with your treatment plan it is important that you contact your provider.   I have prescribed Azithromyin 250 mg: two tables now and then one tablet daily for 4 additonal days    In addition you may use A non-prescription cough medication called Mucinex DM: take 2 tablets every 12 hours. and A prescription cough medication called Tessalon Perles 100mg . You may take 1-2 capsules every 8 hours as needed for your cough.  Sterapred 5 mg dosepak   Thank you for the details in the comments. Sorry you aren't improving. May be bacterial. See treatment above.   USE OF BRONCHODILATOR ("RESCUE") INHALERS: There is a risk from using your bronchodilator too frequently.  The risk is that over-reliance on a medication which only relaxes the muscles surrounding the breathing tubes can reduce the effectiveness of medications prescribed to reduce swelling and congestion of the tubes themselves.   Although you feel brief relief from the bronchodilator inhaler, your asthma may actually be worsening with the tubes becoming more swollen and filled with mucus.  This can delay other crucial treatments, such as oral steroid medications. If you need to use a bronchodilator inhaler daily, several times per day, you should discuss this with your provider.  There are probably better treatments that could be used to keep your asthma under control.     HOME CARE . Only take medications as instructed by your medical team. . Complete the entire course of an antibiotic. . Drink plenty of fluids and get plenty of rest. . Avoid close contacts especially the very young and the elderly . Cover your mouth if you cough or cough into your sleeve. . Always remember to wash your hands . A steam or ultrasonic humidifier can help congestion.   GET HELP RIGHT AWAY IF: . You develop worsening fever. . You become short of breath . You cough up blood. . Your symptoms persist after you have completed your treatment plan MAKE SURE YOU   Understand these instructions.  Will watch your condition.  Will get help right away if you are not doing well or get worse.  Your e-visit answers were reviewed by a board certified advanced clinical practitioner to complete your personal care plan.  Depending on the condition, your plan could have included both over the counter or prescription medications. If there is a problem please reply  once you have received a response from your provider. Your safety is important to [place].  If you have drug allergies check your prescription carefully.    You can use MyChart to ask questions about today's visit, request a non-urgent call back, or ask for a work or school excuse for 24 hours related to this e-Visit. If it has been greater than 24 hours you will need to follow up with your provider, or enter a new e-Visit to address those concerns. You will get an e-mail in the next two days  asking about your experience.  I hope that your e-visit has been valuable and will speed your recovery. Thank you for using e-visits.

## 2016-12-20 DIAGNOSIS — N6310 Unspecified lump in the right breast, unspecified quadrant: Secondary | ICD-10-CM | POA: Diagnosis not present

## 2016-12-20 DIAGNOSIS — C8190 Hodgkin lymphoma, unspecified, unspecified site: Secondary | ICD-10-CM | POA: Diagnosis not present

## 2016-12-20 DIAGNOSIS — Z923 Personal history of irradiation: Secondary | ICD-10-CM | POA: Diagnosis not present

## 2016-12-26 DIAGNOSIS — N6313 Unspecified lump in the right breast, lower outer quadrant: Secondary | ICD-10-CM | POA: Diagnosis not present

## 2016-12-26 DIAGNOSIS — N63 Unspecified lump in unspecified breast: Secondary | ICD-10-CM | POA: Diagnosis not present

## 2017-01-24 ENCOUNTER — Ambulatory Visit (INDEPENDENT_AMBULATORY_CARE_PROVIDER_SITE_OTHER): Payer: BLUE CROSS/BLUE SHIELD | Admitting: Medical

## 2017-01-24 VITALS — BP 134/70 | HR 96 | Wt 171.8 lb

## 2017-01-24 DIAGNOSIS — M549 Dorsalgia, unspecified: Secondary | ICD-10-CM | POA: Diagnosis not present

## 2017-01-24 DIAGNOSIS — M542 Cervicalgia: Secondary | ICD-10-CM | POA: Diagnosis not present

## 2017-01-24 DIAGNOSIS — M62838 Other muscle spasm: Secondary | ICD-10-CM | POA: Diagnosis not present

## 2017-01-24 DIAGNOSIS — R29898 Other symptoms and signs involving the musculoskeletal system: Secondary | ICD-10-CM | POA: Diagnosis not present

## 2017-01-24 MED ORDER — CYCLOBENZAPRINE HCL 10 MG PO TABS: ORAL_TABLET | ORAL | 0 refills | Status: DC

## 2017-01-24 NOTE — Progress Notes (Signed)
Subjective Chief Complaint  Patient presents with  . Neck Pain    neck pain x 3 weeks   Here for neck pain x 3 weeks.   Has been more stiff from not moving neck.  Denies injury, trauma, or fall.   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   No arm pain.  Has some pain in right upper back/shoulder blade area.  Feels like she has lost some strength in the right arm.  No numbness or tingling in arms.   Using some OTC aleve for symptoms.  Has had some shoulder pain prior, shoulder surgery on right in past, but no prior neck issues.   No other aggravating or relieving factors. No other complaint.   Past Medical History:  Diagnosis Date  . Anxiety   . Asthma   . Depression   . GERD (gastroesophageal reflux disease)   . H/O amaurosis fugax Fall 2012   MRI of brain/brain stem: no acute abnormality, small area of encephalomalacia left superior cerebellum that could be prior trauma, infection, or lacunar infarct  . History of Hodgkin's lymphoma 2007   in remission, sees [facility], Dr. Marcell Anger; prior chemotherapy and radiation therapy; sees yearly in December  . History of mammogram 12/2012   normal  . Hypothyroidism   . Migraine    associated ocular symptoms, Dr. Lethea Killings, [facility]  . Moderate mitral regurgitation by prior echocardiogram 09/2010   10/2015: EF 55-60%. Normal WM. Normal D Fxn. Bilateral MVP w/ Mod MR (very eccentric). Normal LA, RV & RA. Normal PAP.  Marland Kitchen MVP (mitral valve prolapse) March 2012   Dr. Ellyn Hack, [facility]; a) Echo 3/'12: Mod MR; EF 60-65%; b) TEE 5/'12: Nl LV Fxn, EF 60-65%, mild, holosystolic prolapse of the medial anterior leaflet. Mod MR . c) Echo 3/'15: Moderate, holosystolic prolapse of  medial. Mod MR d) TM Stress Echo (@ [facility]) Nl Lv Fxn, Mild-Mod MR @ Res0 --> Mod MR at HR 181 bpm (7:22 min, 10.10 METS) w/p WMA  . Oral ulcer   . Palpitations 4/14   holter monitor, Dr. Ellyn Hack; no arrhythmia   . Reactive airway disease with wheezing    PFTs March  2014: mild obstructive defect, normal volumes, normal diffusion, +response to bronchodilator, FEV1 96% predicted, FEV1/FVC 69%; Pulmonology consult 4/14, thought to be related to non-selective beta blockers - she had these symptoms before beta blockers.  Pindolol was changed to Bystolic   Current Outpatient Prescriptions on File Prior to Visit  Medication Sig Dispense Refill  . albuterol (PROVENTIL HFA;VENTOLIN HFA) 108 (90 Base) MCG/ACT inhaler Inhale 2 puffs into the lungs every 6 (six) hours as needed for wheezing or shortness of breath. 1 Inhaler 2  . sertraline (ZOLOFT) 100 MG tablet Take 1 tablet (100 mg total) by mouth daily. 90 tablet 3  . SUMAtriptan (IMITREX) 100 MG tablet TAKE 1 TABLET BY MOUTH EVERY 2 HOURS AS NEEDED FOR MIGRAINE. DO NOT EXCEED MORE THAN 2 TABLETS PER DAY. 10 tablet 5  . SYNTHROID 88 MCG tablet TAKE 1 TABLET BY MOUTH EVERY DAY 90 tablet 0   No current facility-administered medications on file prior to visit.     Past Surgical History:  Procedure Laterality Date  . APPENDECTOMY  1993  . BREAST ENHANCEMENT SURGERY    . COLONOSCOPY     Dr. Collene Mares  . ESOPHAGOGASTRODUODENOSCOPY  04/11/13   [facility] Dr. Collene Mares  . INSERTION CENTRAL VENOUS ACCESS DEVICE W/ SUBCUTANEOUS PORT  2006  . LYMPH  NODE BIOPSY  2006  . NM MYOCAR PERF WALL MOTION  12/2010   persantine myoview - moderate breast attenuation (fixed mid-distal anterior defect), EF 68%, low risk scan  . RHINOPLASTY  2007   deviated septum  . TEE WITHOUT CARDIOVERSION  11/2010   Nl LV Fxn, EF 60-65%, mild, holosystolic prolapse of the medial anterior leaflet. Mod MR .   Marland Kitchen TRANSTHORACIC ECHOCARDIOGRAM  3/'14; 3/'15   LVEF 60-65%, wall motion normal, LV function normal, moderate holosystolic MVP (medial the middle scallop of the posterior leaflet), moderate regurgitation (directed posteriorly), no shunt -- stable over 2 years  . TRANSTHORACIC ECHOCARDIOGRAM  10/2015   EF 55-60%. Normal WM. Normal D Fxn.  Bilateral MVP w/ Mod MR (very eccentric). Normal LA, RV & RA. Normal PAP.   ROS as in subjective   Objective: BP 134/70   Pulse 96   Wt 171 lb 12.8 oz (77.9 kg)   SpO2 97%   BMI 26.91 kg/m   Gen: wd, wn, nad Neck: soft, but decreased neck extension and decreased left and right rotation.  No thyromegaly,no mass, no lymphadenopathy Mild tenderness midline neck and upper T spine to palpation and percussion Bilat arms nontender, normal ROM, no deformity, no swelling, tattoos down left lower arm Arms neurovascularly intact Ext: no edema    Assessment: Encounter Diagnoses  Name Primary?  . Neck muscle spasm Yes  . Neck pain   . Upper back pain   . Decreased range of motion of neck     Plan: Discussed possible causes, but likely torticollis vs radicular neck issue.  Advised heat 20 min BID, massage therapy, can use OTC Aleve BID the next week, flexeril as below for the next 3-4 days.   Discussed ROM and stretching.  If not much improved by Monday, then pursue neck and T spine xray.  Jean was seen today for neck pain.  Diagnoses and all orders for this visit:  Neck muscle spasm  Neck pain  Upper back pain  Decreased range of motion of neck  Other orders -     cyclobenzaprine (FLEXERIL) 10 MG tablet; 1/2-1 tablet po QHS or up to BID

## 2017-01-25 ENCOUNTER — Telehealth: Payer: Self-pay | Admitting: Medical

## 2017-01-25 ENCOUNTER — Ambulatory Visit
Admission: RE | Admit: 2017-01-25 | Discharge: 2017-01-25 | Disposition: A | Discharge status: Home / Self Care | Payer: BLUE CROSS/BLUE SHIELD | Source: Ambulatory Visit | Attending: Medical | Admitting: Medical

## 2017-01-25 ENCOUNTER — Other Ambulatory Visit: Payer: Self-pay | Admitting: Medical

## 2017-01-25 DIAGNOSIS — M546 Pain in thoracic spine: Secondary | ICD-10-CM | POA: Diagnosis not present

## 2017-01-25 DIAGNOSIS — M62838 Other muscle spasm: Secondary | ICD-10-CM

## 2017-01-25 DIAGNOSIS — M549 Dorsalgia, unspecified: Secondary | ICD-10-CM

## 2017-01-25 DIAGNOSIS — M542 Cervicalgia: Secondary | ICD-10-CM

## 2017-01-25 NOTE — Telephone Encounter (Signed)
Called and informed pt that she could go ahead and go get the x-ray and [facility] per shane tysinger

## 2017-01-25 NOTE — Telephone Encounter (Signed)
Pt can go have a x-ray per shane

## 2017-01-25 NOTE — Telephone Encounter (Signed)
Pt called back and states that she still has had no refief of the pain in her neck, she has taking 2 flexeril she wants to know if she can go ahead and have the x-ray for it she is going out of town tomorrow,,she also has tried the [employer] but noting is working,  She dont want to be is a lot of pain the weekend, she can be reached at [phone]

## 2017-01-25 NOTE — Telephone Encounter (Signed)
Have her go for C and T spine xrays, [facility]

## 2017-02-09 ENCOUNTER — Other Ambulatory Visit: Payer: Self-pay | Admitting: Medical

## 2017-04-24 ENCOUNTER — Telehealth: Payer: BLUE CROSS/BLUE SHIELD | Admitting: Family

## 2017-04-24 DIAGNOSIS — L255 Unspecified contact dermatitis due to plants, except food: Secondary | ICD-10-CM

## 2017-04-24 MED ORDER — PREDNISONE 20 MG PO TABS: 20.0000 mg | ORAL_TABLET | Freq: Every day | ORAL | 0 refills | Status: DC

## 2017-04-24 NOTE — Progress Notes (Signed)
E Visit for Rash  We are sorry that you are not feeling well. Here is how we plan to help!    Prednisone 20 mg daily for 6 days    HOME CARE:   Take cool showers and avoid direct sunlight.  Apply cool compress or wet dressings.  Take a bath in an oatmeal bath.  Sprinkle content of one Aveeno packet under running faucet with comfortably warm water.  Bathe for 15-20 minutes, 1-2 times daily.  Pat dry with a towel. Do not rub the rash.  Use hydrocortisone cream.  Take an antihistamine like Benadryl for widespread rashes that itch.  The adult dose of Benadryl is 25-50 mg by mouth 4 times daily.  Caution:  This type of medication may cause sleepiness.  Do not drink alcohol, drive, or operate dangerous machinery while taking antihistamines.  Do not take these medications if you have prostate enlargement.  Read package instructions thoroughly on all medications that you take.  GET HELP RIGHT AWAY IF:   Symptoms don't go away after treatment.  Severe itching that persists.  If you rash spreads or swells.  If you rash begins to smell.  If it blisters and opens or develops a yellow-brown crust.  You develop a fever.  You have a sore throat.  You become short of breath.  MAKE SURE YOU:  Understand these instructions. Will watch your condition. Will get help right away if you are not doing well or get worse.  Thank you for choosing an e-visit. Your e-visit answers were reviewed by a board certified advanced clinical practitioner to complete your personal care plan. Depending upon the condition, your plan could have included both over the counter or prescription medications. Please review your pharmacy choice. Be sure that the pharmacy you have chosen is open so that you can pick up your prescription now.  If there is a problem you may message your provider in [place] to have the prescription routed to another pharmacy. Your safety is important to [place]. If you have drug  allergies check your prescription carefully.  For the next 24 hours, you can use MyChart to ask questions about today's visit, request a non-urgent call back, or ask for a work or school excuse from your e-visit provider. You will get an email in the next two days asking about your experience. I hope that your e-visit has been valuable and will speed your recovery.

## 2017-05-13 ENCOUNTER — Other Ambulatory Visit: Payer: Self-pay | Admitting: Medical

## 2017-05-14 NOTE — Telephone Encounter (Signed)
Called and l/m for pt to come in to have a med check, will give her 30 days.

## 2017-05-17 ENCOUNTER — Telehealth: Payer: BLUE CROSS/BLUE SHIELD | Admitting: Family

## 2017-05-17 DIAGNOSIS — J019 Acute sinusitis, unspecified: Secondary | ICD-10-CM

## 2017-05-17 MED ORDER — FLUTICASONE PROPIONATE 50 MCG/ACT NA SUSP: 2.0000 | Freq: Two times a day (BID) | NASAL | 6 refills | Status: DC

## 2017-05-17 NOTE — Progress Notes (Signed)
Thank you for the details you included in the comment boxes. Those details are very helpful in determining the best course of treatment for you and help [place] to provide the best care.  We are sorry that you are not feeling well.  Here is how we plan to help!  Based on what you have shared with me it looks like you have sinusitis.  Sinusitis is inflammation and infection in the sinus cavities of the head.  Based on your presentation I believe you most likely have Acute Viral Sinusitis.This is an infection most likely caused by a virus. There is not specific treatment for viral sinusitis other than to help you with the symptoms until the infection runs its course.  You may use an oral decongestant such as Mucinex D or if you have glaucoma or high blood pressure use plain Mucinex. Saline nasal spray help and can safely be used as often as needed for congestion, I have prescribed: Fluticasone nasal spray two sprays in each nostril twice a day     Some authorities believe that zinc sprays or the use of Echinacea may shorten the course of your symptoms.  Sinus infections are not as easily transmitted as other respiratory infection, however we still recommend that you avoid close contact with loved ones, especially the very young and elderly.  Remember to wash your hands thoroughly throughout the day as this is the number one way to prevent the spread of infection!  Home Care:  Only take medications as instructed by your medical team.  Complete the entire course of an antibiotic.  Do not take these medications with alcohol.  A steam or ultrasonic humidifier can help congestion.  You can place a towel over your head and breathe in the steam from hot water coming from a faucet.  Avoid close contacts especially the very young and the elderly.  Cover your mouth when you cough or sneeze.  Always remember to wash your hands.  Get Help Right Away If:  You develop worsening fever or sinus pain.  You  develop a severe head ache or visual changes.  Your symptoms persist after you have completed your treatment plan.  Make sure you  Understand these instructions.  Will watch your condition.  Will get help right away if you are not doing well or get worse.  Your e-visit answers were reviewed by a board certified advanced clinical practitioner to complete your personal care plan.  Depending on the condition, your plan could have included both over the counter or prescription medications.  If there is a problem please reply  once you have received a response from your provider.  Your safety is important to [place].  If you have drug allergies check your prescription carefully.    You can use MyChart to ask questions about today's visit, request a non-urgent call back, or ask for a work or school excuse for 24 hours related to this e-Visit. If it has been greater than 24 hours you will need to follow up with your provider, or enter a new e-Visit to address those concerns.  You will get an e-mail in the next two days asking about your experience.  I hope that your e-visit has been valuable and will speed your recovery. Thank you for using e-visits.

## 2017-05-18 ENCOUNTER — Other Ambulatory Visit: Payer: Self-pay | Admitting: Medical

## 2017-05-28 ENCOUNTER — Telehealth: Payer: BLUE CROSS/BLUE SHIELD | Admitting: Family

## 2017-05-28 DIAGNOSIS — B9689 Other specified bacterial agents as the cause of diseases classified elsewhere: Secondary | ICD-10-CM

## 2017-05-28 DIAGNOSIS — J028 Acute pharyngitis due to other specified organisms: Secondary | ICD-10-CM

## 2017-05-28 MED ORDER — PREDNISONE 5 MG PO TABS: 5.0000 mg | ORAL_TABLET | ORAL | 0 refills | Status: DC

## 2017-05-28 MED ORDER — BENZONATATE 100 MG PO CAPS: 100.0000 mg | ORAL_CAPSULE | Freq: Three times a day (TID) | ORAL | 0 refills | Status: DC | PRN

## 2017-05-28 MED ORDER — AZITHROMYCIN 250 MG PO TABS: ORAL_TABLET | ORAL | 0 refills | Status: DC

## 2017-05-28 NOTE — Progress Notes (Signed)
Thank you for the details you included in the comment boxes. Those details are very helpful in determining the best course of treatment for you and help [place] to provide the best care. This is not consistent with flu given how long it has been. We will treat this as a bacterial infection with antibiotics. I am concerned about the high fever. Please continue to treat this with tylenol per the label. If it persists over 101.4 and not responding to Tylenol, you should be seen face-to-face.   We are sorry that you are not feeling well.  Here is how we plan to help!  Based on your presentation I believe you most likely have A cough due to bacteria.  When patients have a fever and a productive cough with a change in color or increased sputum production, we are concerned about bacterial bronchitis.  If left untreated it can progress to pneumonia.  If your symptoms do not improve with your treatment plan it is important that you contact your provider.   I have prescribed Azithromyin 250 mg: two tablets now and then one tablet daily for 4 additonal days    In addition you may use A non-prescription cough medication called Mucinex DM: take 2 tablets every 12 hours. and A prescription cough medication called Tessalon Perles 100mg . You may take 1-2 capsules every 8 hours as needed for your cough.  Sterapred 5 mg dosepak  From your responses in the eVisit questionnaire you describe inflammation in the upper respiratory tract which is causing a significant cough.  This is commonly called Bronchitis and has four common causes:    Allergies  Viral Infections  Acid Reflux  Bacterial Infection Allergies, viruses and acid reflux are treated by controlling symptoms or eliminating the cause. An example might be a cough caused by taking certain blood pressure medications. You stop the cough by changing the medication. Another example might be a cough caused by acid reflux. Controlling the reflux helps control the  cough.  USE OF BRONCHODILATOR ("RESCUE") INHALERS: There is a risk from using your bronchodilator too frequently.  The risk is that over-reliance on a medication which only relaxes the muscles surrounding the breathing tubes can reduce the effectiveness of medications prescribed to reduce swelling and congestion of the tubes themselves.  Although you feel brief relief from the bronchodilator inhaler, your asthma may actually be worsening with the tubes becoming more swollen and filled with mucus.  This can delay other crucial treatments, such as oral steroid medications. If you need to use a bronchodilator inhaler daily, several times per day, you should discuss this with your provider.  There are probably better treatments that could be used to keep your asthma under control.     HOME CARE . Only take medications as instructed by your medical team. . Complete the entire course of an antibiotic. . Drink plenty of fluids and get plenty of rest. . Avoid close contacts especially the very young and the elderly . Cover your mouth if you cough or cough into your sleeve. . Always remember to wash your hands . A steam or ultrasonic humidifier can help congestion.   GET HELP RIGHT AWAY IF: . You develop worsening fever. . You become short of breath . You cough up blood. . Your symptoms persist after you have completed your treatment plan MAKE SURE YOU   Understand these instructions.  Will watch your condition.  Will get help right away if you are not doing well or get  worse.  Your e-visit answers were reviewed by a board certified advanced clinical practitioner to complete your personal care plan.  Depending on the condition, your plan could have included both over the counter or prescription medications. If there is a problem please reply  once you have received a response from your provider. Your safety is important to [place].  If you have drug allergies check your prescription carefully.     You can use MyChart to ask questions about today's visit, request a non-urgent call back, or ask for a work or school excuse for 24 hours related to this e-Visit. If it has been greater than 24 hours you will need to follow up with your provider, or enter a new e-Visit to address those concerns. You will get an e-mail in the next two days asking about your experience.  I hope that your e-visit has been valuable and will speed your recovery. Thank you for using e-visits.

## 2017-06-10 ENCOUNTER — Other Ambulatory Visit: Payer: Self-pay | Admitting: Medical

## 2017-06-11 ENCOUNTER — Telehealth: Payer: Self-pay | Admitting: Medical

## 2017-06-11 NOTE — Telephone Encounter (Signed)
Fax refill request  Synthroid 88 mcg # 90  Did leave message for pt to call and schedule appointment  She is due for cpe

## 2017-06-11 NOTE — Telephone Encounter (Signed)
Can pt have a refill on meds  

## 2017-06-12 ENCOUNTER — Other Ambulatory Visit: Payer: Self-pay

## 2017-06-12 MED ORDER — SYNTHROID 88 MCG PO TABS: 88.0000 ug | ORAL_TABLET | Freq: Every day | ORAL | 0 refills | Status: DC

## 2017-06-12 NOTE — Telephone Encounter (Signed)
Sent in refill to pharmacy and made an appt for cpe

## 2017-07-03 DIAGNOSIS — Z9189 Other specified personal risk factors, not elsewhere classified: Secondary | ICD-10-CM | POA: Diagnosis not present

## 2017-07-03 LAB — HM MAMMOGRAPHY

## 2017-07-11 ENCOUNTER — Other Ambulatory Visit: Payer: Self-pay | Admitting: Medical

## 2017-07-17 DIAGNOSIS — E785 Hyperlipidemia, unspecified: Secondary | ICD-10-CM

## 2017-07-17 HISTORY — DX: Hyperlipidemia, unspecified: E78.5

## 2017-07-20 DIAGNOSIS — Z1231 Encounter for screening mammogram for malignant neoplasm of breast: Secondary | ICD-10-CM | POA: Diagnosis not present

## 2017-07-26 ENCOUNTER — Ambulatory Visit: Payer: BLUE CROSS/BLUE SHIELD | Admitting: Medical

## 2017-07-26 ENCOUNTER — Encounter: Payer: Self-pay | Admitting: Medical

## 2017-07-26 VITALS — BP 130/80 | HR 81 | Ht 66.0 in | Wt 168.0 lb

## 2017-07-26 DIAGNOSIS — R002 Palpitations: Secondary | ICD-10-CM | POA: Diagnosis not present

## 2017-07-26 DIAGNOSIS — J4520 Mild intermittent asthma, uncomplicated: Secondary | ICD-10-CM

## 2017-07-26 DIAGNOSIS — K121 Other forms of stomatitis: Secondary | ICD-10-CM

## 2017-07-26 DIAGNOSIS — I7300 Raynaud's syndrome without gangrene: Secondary | ICD-10-CM

## 2017-07-26 DIAGNOSIS — E038 Other specified hypothyroidism: Secondary | ICD-10-CM

## 2017-07-26 DIAGNOSIS — I341 Nonrheumatic mitral (valve) prolapse: Secondary | ICD-10-CM

## 2017-07-26 DIAGNOSIS — F411 Generalized anxiety disorder: Secondary | ICD-10-CM

## 2017-07-26 DIAGNOSIS — Z7189 Other specified counseling: Secondary | ICD-10-CM | POA: Diagnosis not present

## 2017-07-26 DIAGNOSIS — Z0000 Encounter for general adult medical examination without abnormal findings: Secondary | ICD-10-CM

## 2017-07-26 DIAGNOSIS — I340 Nonrheumatic mitral (valve) insufficiency: Secondary | ICD-10-CM

## 2017-07-26 DIAGNOSIS — Z7185 Encounter for immunization safety counseling: Secondary | ICD-10-CM

## 2017-07-26 DIAGNOSIS — C8190 Hodgkin lymphoma, unspecified, unspecified site: Secondary | ICD-10-CM

## 2017-07-26 DIAGNOSIS — C819A Hodgkin lymphoma, unspecified, in remission: Secondary | ICD-10-CM

## 2017-07-26 DIAGNOSIS — Z2821 Immunization not carried out because of patient refusal: Secondary | ICD-10-CM

## 2017-07-26 NOTE — Progress Notes (Signed)
Subjective:   HPI  Lisa Waters is a 37 y.o. female who presents for physical Chief Complaint  Patient presents with  . Annual Exam    physical , fasting for labs, no pap , no other concerns     Medical care team includes: Josha Weekley, Leward Quan here for primary care Dentist Eye doctor Dr. Glenetta Hew, cardiology Dr. Natividad Brood, general surgery regarding breast biopsy Dr. Huel Cote, oncology at [facility] (yearly) Dr. Juanita Craver, GI Dr. Saundra Shelling, dermatology at [place] Dr. Kerin Perna, gynecology and fertility   Concerns: Her only recent concern has been palpitations.  They have been a little frequent of late, at times feels a little dizzy.  Denies syncope, collapse, nausea or sweats.  She declines vaccines in general after we discussed them  She sees several specialists including at [facility] in here locally.  She recently had Pap smear and mammogram.  Her last cardiology visit was 2017 with echocardiogram at that time.  Reviewed their medical, surgical, family, social, medication, and allergy history and updated chart as appropriate.  Past Medical History:  Diagnosis Date  . Anxiety   . Asthma    mild intermittent as of 07/2017  . Depression   . GERD (gastroesophageal reflux disease)   . H/O amaurosis fugax Fall 2012   MRI of brain/brain stem: no acute abnormality, small area of encephalomalacia left superior cerebellum that could be prior trauma, infection, or lacunar infarct  . History of Hodgkin's lymphoma 2007   in remission, sees [facility], prior with Dr. Marcell Anger; prior chemotherapy and radiation therapy; sees yearly in December  . Hypothyroidism   . Migraine    associated ocular symptoms, Dr. Lethea Killings, [facility]  . Moderate mitral regurgitation by prior echocardiogram 09/2010   10/2015: EF 55-60%. Normal WM. Normal D Fxn. Bilateral MVP w/ Mod MR (very eccentric). Normal LA, RV & RA. Normal PAP.  Marland Kitchen MVP (mitral valve prolapse) 09/2010    Dr. Ellyn Hack, [facility]; a) Echo 3/'12: Mod MR; EF 60-65%; b) TEE 5/'12: Nl LV Fxn, EF 60-65%, mild, holosystolic prolapse of the medial anterior leaflet. Mod MR . c) Echo 3/'15: Moderate, holosystolic prolapse of  medial. Mod MR d) TM Stress Echo (@ [facility]) Nl Lv Fxn, Mild-Mod MR @ Res0 --> Mod MR at HR 181 bpm (7:22 min, 10.10 METS) w/p WMA  . Oral ulcer   . Palpitations 4/14   holter monitor, Dr. Ellyn Hack; no arrhythmia     Past Surgical History:  Procedure Laterality Date  . APPENDECTOMY  1993  . BREAST ENHANCEMENT SURGERY    . COLONOSCOPY  07/2014   Dr. Collene Mares  . ESOPHAGOGASTRODUODENOSCOPY  04/11/13   [facility] Dr. Collene Mares  . INSERTION CENTRAL VENOUS ACCESS DEVICE W/ SUBCUTANEOUS PORT  2006  . LYMPH NODE BIOPSY  2006  . NM MYOCAR PERF WALL MOTION  12/2010   persantine myoview - moderate breast attenuation (fixed mid-distal anterior defect), EF 68%, low risk scan  . RHINOPLASTY  2007   deviated septum  . TEE WITHOUT CARDIOVERSION  11/2010   Nl LV Fxn, EF 60-65%, mild, holosystolic prolapse of the medial anterior leaflet. Mod MR .   Marland Kitchen TRANSTHORACIC ECHOCARDIOGRAM  3/'14; 3/'15   LVEF 60-65%, wall motion normal, LV function normal, moderate holosystolic MVP (medial the middle scallop of the posterior leaflet), moderate regurgitation (directed posteriorly), no shunt -- stable over 2 years  . TRANSTHORACIC ECHOCARDIOGRAM  10/2015   EF 55-60%. Normal WM. Normal D Fxn. Bilateral MVP w/  Mod MR (very eccentric). Normal LA, RV & RA. Normal PAP.    Social History   Socioeconomic History  . Marital status: Married    Spouse name: Not on file  . Number of children: 0  . Years of education: Not on file  . Highest education level: Not on file  Social Needs  . Financial resource strain: Not on file  . Food insecurity - worry: Not on file  . Food insecurity - inability: Not on file  . Transportation needs - medical: Not on file  . Transportation needs - non-medical: Not on file   Occupational History  . Occupation: works in Engineer, production: [employer]  Tobacco Use  . Smoking status: Former Smoker    Packs/day: 1.00    Years: 10.00    Pack years: 10.00    Types: Cigarettes    Last attempt to quit: 11/07/2009    Years since quitting: 7.7  . Smokeless tobacco: Never Used  Substance and Sexual Activity  . Alcohol use: Yes    Alcohol/week: 0.6 - 1.2 oz    Types: 1 - 2 Cans of beer per week    Comment: social, not weekly  . Drug use: No  . Sexual activity: Yes    Partners: Male    Birth control/protection: None  Other Topics Concern  . Not on file  Social History Narrative   Married, 0 children, has failed fertility treatment & has given up attempts at IVF as well as adoption. 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   Formerly was Office manager at [employer], exercise - active in the business,  Architect.  07/2017    Family History  Problem Relation Age of Onset  . Skin cancer Father   . Cancer Paternal Grandmother        metastasis  . Alcohol abuse Mother   . Emphysema Paternal Grandfather        was a smoker, heart problems  . Diabetes Maternal Aunt   . OCD Maternal Aunt   . Bipolar disorder Maternal Aunt   . Stroke Maternal Grandmother   . Cancer Maternal Aunt        skin  . Heart disease Neg Hx   . Hypertension Neg Hx   . Hyperlipidemia Neg Hx      Current Outpatient Medications:  .  sertraline (ZOLOFT) 100 MG tablet, TAKE 1 TABLET (100 MG TOTAL) BY MOUTH DAILY., Disp: 90 tablet, Rfl: 0 .  SYNTHROID 88 MCG tablet, TAKE 1 TABLET BY MOUTH EVERY DAY, Disp: 30 tablet, Rfl: 0 .  fluticasone (FLONASE) 50 MCG/ACT nasal spray, Place 2 sprays into both nostrils 2 (two) times daily., Disp: 16 g, Rfl: 6  Allergies  Allergen Reactions  . Codeine Itching  . Fish Allergy     Mahi Mahi  . Iohexol Hives  . Pindolol     Avoid non selective beta blockers due to dyspnea  . Iodinated Diagnostic Agents Rash    Has  received since rash developed without pre-meds without development of rash.    Review of Systems Constitutional: -fever, -chills, -sweats, -unexpected weight change, -decreased appetite, -fatigue Allergy: -sneezing, -itching, -congestion Dermatology: -changing moles, --rash, -lumps ENT: -runny nose, -ear pain, -sore throat, -hoarseness, -sinus pain, -teeth pain, - ringing in ears, -hearing loss, -nosebleeds Cardiology: -chest pain, +palpitations, -swelling, -difficulty breathing when lying flat, -waking up short of breath Respiratory: -cough, -shortness of breath, -difficulty breathing with exercise or exertion, -wheezing, -  coughing up blood Gastroenterology: -abdominal pain, -nausea, -vomiting, -diarrhea, -constipation, -blood in stool, -changes in bowel movement, -difficulty swallowing or eating Hematology: -bleeding, -bruising  Musculoskeletal: -joint aches, -muscle aches, -joint swelling, -back pain, -neck pain, -cramping, -changes in gait Ophthalmology: denies vision changes, eye redness, itching, discharge Urology: -burning with urination, -difficulty urinating, -blood in urine, -urinary frequency, -urgency, -incontinence Neurology: -headache, -weakness, -tingling, -numbness, -memory loss, -falls, -dizziness Psychology: -depressed mood, -agitation, -sleep problems Breast/gyn: -breast tenderness, -discharge, -lumps, -vaginal discharge,- irregular periods, -heavy periods    Objective:   BP 130/80   Pulse 81   Ht 5\' 6"  (1.676 m)   Wt 168 lb (76.2 kg)   SpO2 98%   BMI 27.12 kg/m   General appearance: alert, no distress, WD/WN, Caucasian female Skin: tattoo low back, scattered macules throughout, pink/purplish coloration of hands c/w raynauds, cool fingertips HEENT: normocephalic, conjunctiva/corneas normal, sclerae anicteric, PERRLA, EOMi, nares patent, no discharge or erythema, pharynx normal Oral cavity: MMM, tongue normal, teeth in good repair Neck: supple, no lymphadenopathy,  no thyromegaly, no masses, normal ROM, no bruits Chest: non tender, normal shape and expansion Heart: 2/6 brief murmur heart beast in left sternal border, otherwise RRR, normal S1, S2 Lungs: CTA bilaterally, no wheezes, rhonchi, or rales Abdomen: +bs, soft, non tender, non distended, no masses, no hepatomegaly, no splenomegaly, no bruits Back: non tender, normal ROM, no scoliosis Musculoskeletal: upper extremities non tender, no obvious deformity, normal ROM throughout, lower extremities non tender, no obvious deformity, normal ROM throughout Extremities: no edema, no cyanosis, no clubbing Pulses: 2+ symmetric, upper and lower extremities, normal cap refill Neurological: alert, oriented x 3, CN2-12 intact, strength normal upper extremities and lower extremities, sensation normal throughout, DTRs 2+ throughout, no cerebellar signs, gait normal Psychiatric: normal affect, behavior normal, pleasant  Breast/gyn/rectal - deferred to gynecology   Adult ECG Report  Indication: palpitations  Rate: 82 bpm  Rhythm: normal sinus rhythm  QRS Axis: 75 degrees  PR Interval: 138ms  QRS Duration: 36ms  QTc: 442ms  Conduction Disturbances: none  Other Abnormalities: none  Patient's cardiac risk factors are: MVP.  EKG comparison: 10/2016  Narrative Interpretation: normal EKG     Assessment and Plan :    Encounter Diagnoses  Name Primary?  . Encounter for health maintenance examination in adult Yes  . Palpitation   . Moderate mitral regurgitation by prior echocardiogram   . MVP (mitral valve prolapse)   . Raynaud's disease without gangrene   . Mild intermittent asthma without complication   . Other specified hypothyroidism   . Oral ulcer   . Generalized anxiety disorder   . Hodgkin's disease in remission ([place])   . Influenza vaccination declined   . Vaccine counseling     Physical exam - discussed and counseled on healthy lifestyle, diet, exercise, preventative care, vaccinations, sick and  well care, proper use of emergency dept and after hours care, and addressed their concerns.    Health screening: See your eye doctor yearly for routine vision care. See your dentist yearly for routine dental care including hygiene visits twice yearly. See your gynecologist yearly for routine gynecological care.  Cancer screening She sees gynecology, just had Pap smear a few months ago She also just recently had a breast mammogram and has a breast MRI, note in a few months for surveillance I reviewed her prior EGD and colonoscopy results  Vaccinations: Counseled on the following vaccines:  Influenza, pneumococcal, particularly in light of her asthma history, albeit mild, and MVP.  She declines however  Acute issues discussed: Palpitations-EKG reviewed.  Labs today.  Separate significant chronic issues discussed: Hypothyroidism-labs today  Raynaud's phenomenon- tried Nifedipine, but had some issues with this but cannot recall what the side effect was. Declines any other vasodilator or other medication at this time  Asthma-mild intermittent.  She reports only flareups mild with illness.  Declines albuterol refill  MVP, mitral valve regurgitation-review 2017 echocardiogram.  Advise she follow-up with Dr. Ellyn Hack, likely due for repeat  Follow-up with her specialist as usual  Anxiety, history of depression-doing well, no major concerns.  Overall she seems to be doing well and probably the best I have seen her  Jaliana was seen today for annual exam.  Diagnoses and all orders for this visit:  Encounter for health maintenance examination in adult -     EKG 12-Lead -     Lipid panel -     Comprehensive metabolic panel -     TSH -     CBC -     VITAMIN D 25 Hydroxy (Vit-D Deficiency, Fractures) -     T4, free  Palpitation -     EKG 12-Lead  Moderate mitral regurgitation by prior echocardiogram  MVP (mitral valve prolapse)  Raynaud's disease without gangrene  Mild  intermittent asthma without complication  Other specified hypothyroidism -     TSH -     T4, free  Oral ulcer  Generalized anxiety disorder  Hodgkin's disease in remission ([place])  Influenza vaccination declined  Vaccine counseling   Follow-up pending labs, yearly for physical

## 2017-07-27 ENCOUNTER — Other Ambulatory Visit: Payer: Self-pay | Admitting: Medical

## 2017-07-27 DIAGNOSIS — E559 Vitamin D deficiency, unspecified: Secondary | ICD-10-CM

## 2017-07-27 LAB — COMPREHENSIVE METABOLIC PANEL
AG RATIO: 1.6 (calc) (ref 1.0–2.5)
ALBUMIN MSPROF: 4.6 g/dL (ref 3.6–5.1)
ALKALINE PHOSPHATASE (APISO): 46 U/L (ref 33–115)
ALT: 9 U/L (ref 6–29)
AST: 21 U/L (ref 10–30)
BUN: 10 mg/dL (ref 7–25)
CHLORIDE: 100 mmol/L (ref 98–110)
CO2: 24 mmol/L (ref 20–32)
Calcium: 9.6 mg/dL (ref 8.6–10.2)
Creat: 0.71 mg/dL (ref 0.50–1.10)
GLUCOSE: 76 mg/dL (ref 65–99)
Globulin: 2.8 g/dL (calc) (ref 1.9–3.7)
Potassium: 4.4 mmol/L (ref 3.5–5.3)
Sodium: 136 mmol/L (ref 135–146)
TOTAL PROTEIN: 7.4 g/dL (ref 6.1–8.1)
Total Bilirubin: 0.6 mg/dL (ref 0.2–1.2)

## 2017-07-27 LAB — LIPID PANEL
CHOL/HDL RATIO: 2.5 (calc) (ref <5.0)
CHOLESTEROL: 285 mg/dL — AB (ref <200)
HDL: 116 mg/dL (ref >50)
LDL CHOLESTEROL (CALC): 154 mg/dL — AB
NON-HDL CHOLESTEROL (CALC): 169 mg/dL — AB (ref <130)
Triglycerides: 59 mg/dL (ref <150)

## 2017-07-27 LAB — CBC
HEMATOCRIT: 38.6 % (ref 35.0–45.0)
Hemoglobin: 13.2 g/dL (ref 11.7–15.5)
MCH: 30.8 pg (ref 27.0–33.0)
MCHC: 34.2 g/dL (ref 32.0–36.0)
MCV: 90 fL (ref 80.0–100.0)
MPV: 8.8 fL (ref 7.5–12.5)
Platelets: 306 10*3/uL (ref 140–400)
RBC: 4.29 10*6/uL (ref 3.80–5.10)
RDW: 12.5 % (ref 11.0–15.0)
WBC: 4.8 10*3/uL (ref 3.8–10.8)

## 2017-07-27 LAB — T4, FREE: FREE T4: 1.2 ng/dL (ref 0.8–1.8)

## 2017-07-27 LAB — VITAMIN D 25 HYDROXY (VIT D DEFICIENCY, FRACTURES): VIT D 25 HYDROXY: 8 ng/mL — AB (ref 30–100)

## 2017-07-27 LAB — TSH: TSH: 3.44 m[IU]/L

## 2017-07-27 MED ORDER — SYNTHROID 88 MCG PO TABS: 88.0000 ug | ORAL_TABLET | Freq: Every day | ORAL | 3 refills | Status: DC

## 2017-07-27 MED ORDER — VITAMIN D (ERGOCALCIFEROL) 1.25 MG (50000 UNIT) PO CAPS: 50000.0000 [IU] | ORAL_CAPSULE | ORAL | 3 refills | Status: DC

## 2017-07-27 MED ORDER — SERTRALINE HCL 100 MG PO TABS: 100.0000 mg | ORAL_TABLET | Freq: Every day | ORAL | 3 refills | Status: DC

## 2017-08-14 ENCOUNTER — Telehealth: Payer: BLUE CROSS/BLUE SHIELD | Admitting: Family

## 2017-08-14 DIAGNOSIS — R05 Cough: Secondary | ICD-10-CM

## 2017-08-14 DIAGNOSIS — R059 Cough, unspecified: Secondary | ICD-10-CM

## 2017-08-14 MED ORDER — PREDNISONE 10 MG (21) PO TBPK: ORAL_TABLET | ORAL | 0 refills | Status: DC

## 2017-08-14 NOTE — Progress Notes (Signed)

## 2017-08-22 ENCOUNTER — Ambulatory Visit: Payer: BLUE CROSS/BLUE SHIELD | Admitting: Medical

## 2017-08-22 VITALS — BP 112/68 | HR 78 | Temp 98.2°F | Resp 16 | Wt 173.6 lb

## 2017-08-22 DIAGNOSIS — R0600 Dyspnea, unspecified: Secondary | ICD-10-CM | POA: Diagnosis not present

## 2017-08-22 DIAGNOSIS — R05 Cough: Secondary | ICD-10-CM | POA: Diagnosis not present

## 2017-08-22 DIAGNOSIS — R5383 Other fatigue: Secondary | ICD-10-CM

## 2017-08-22 DIAGNOSIS — R059 Cough, unspecified: Secondary | ICD-10-CM

## 2017-08-22 MED ORDER — AZITHROMYCIN 250 MG PO TABS: ORAL_TABLET | ORAL | 0 refills | Status: DC

## 2017-08-22 MED ORDER — ALBUTEROL SULFATE HFA 108 (90 BASE) MCG/ACT IN AERS: 2.0000 | INHALATION_SPRAY | Freq: Four times a day (QID) | RESPIRATORY_TRACT | 0 refills | Status: DC | PRN

## 2017-08-22 NOTE — Progress Notes (Signed)
Subjective: Chief Complaint  Patient presents with  . coughing, tightness in chest    coughing, thightness in chest  on the right side, congestion x 3 weeks    Here for cough, illness.  Started 3 weeks ago with  cough, headache, not feeling well.  Now has deep cough, pain in right upper back, mild headache, but no chills, no body aches, no sore throat.  Has had some ear itching.  No fever, no sick contacts.   She had done an E-visit and prednisone and Tessalon Perles were sent out.   She took these and she tried her inhaler which may be expired or empty.   No real improvement so far.   No other aggravating or relieving factors. No other complaint.  Past Medical History:  Diagnosis Date  . Anxiety   . Asthma    mild intermittent as of 07/2017  . Depression   . GERD (gastroesophageal reflux disease)   . H/O amaurosis fugax Fall 2012   MRI of brain/brain stem: no acute abnormality, small area of encephalomalacia left superior cerebellum that could be prior trauma, infection, or lacunar infarct  . History of Hodgkin's lymphoma 2007   in remission, sees [facility], prior with Dr. Marcell Anger; prior chemotherapy and radiation therapy; sees yearly in December  . Hypothyroidism   . Migraine    associated ocular symptoms, Dr. Lethea Killings, [facility]  . Moderate mitral regurgitation by prior echocardiogram 09/2010   10/2015: EF 55-60%. Normal WM. Normal D Fxn. Bilateral MVP w/ Mod MR (very eccentric). Normal LA, RV & RA. Normal PAP.  Marland Kitchen MVP (mitral valve prolapse) 09/2010   Dr. Ellyn Hack, [facility]; a) Echo 3/'12: Mod MR; EF 60-65%; b) TEE 5/'12: Nl LV Fxn, EF 60-65%, mild, holosystolic prolapse of the medial anterior leaflet. Mod MR . c) Echo 3/'15: Moderate, holosystolic prolapse of  medial. Mod MR d) TM Stress Echo (@ [facility]) Nl Lv Fxn, Mild-Mod MR @ Res0 --> Mod MR at HR 181 bpm (7:22 min, 10.10 METS) w/p WMA  . Oral ulcer   . Palpitations 4/14   holter monitor, Dr. Ellyn Hack; no arrhythmia    Current Outpatient  Medications on File Prior to Visit  Medication Sig Dispense Refill  . sertraline (ZOLOFT) 100 MG tablet Take 1 tablet (100 mg total) by mouth daily. 90 tablet 3  . SYNTHROID 88 MCG tablet Take 1 tablet (88 mcg total) by mouth daily. 90 tablet 3  . Vitamin D, Ergocalciferol, (DRISDOL) 50000 units CAPS capsule Take 1 capsule (50,000 Units total) by mouth every 7 (seven) days. 12 capsule 3  . fluticasone (FLONASE) 50 MCG/ACT nasal spray Place 2 sprays into both nostrils 2 (two) times daily. 16 g 6  . predniSONE (STERAPRED UNI-PAK 21 TAB) 10 MG (21) TBPK tablet As directed (Patient not taking: Reported on 08/22/2017) 21 tablet 0   No current facility-administered medications on file prior to visit.    ROS as in subjective   Objective: BP 112/68   Pulse 78   Temp 98.2 F (36.8 C)   Resp 16   Wt 173 lb 9.6 oz (78.7 kg)   SpO2 98%   BMI 28.02 kg/m   General appearance: alert, no distress, WD/WN, mildly ill appearing HEENT: normocephalic, sclerae anicteric, conjunctiva pink and moist, TMs pearly, nares patent, no discharge or erythema, pharynx normal, tonsils unremarkable Oral cavity: MMM, no lesions Neck: supple, no lymphadenopathy, no thyromegaly, no masses Heart: RRR, normal S1, S2, no murmurs Lungs: CTA bilaterally, no wheezes, rhonchi, or  rales Pulses: 2+ symmetric     Assessment: Encounter Diagnoses  Name Primary?  . Cough Yes  . Dyspnea, unspecified type   . Other fatigue     Plan: Discussed differential.  I suspect lingering respiratory tract infection.   Medications below, rest, hydrate well.  If not resolved or much better within 3 - 4 days, call back and we will either recheck or send for chest xray.  Johnnie was seen today for coughing, tightness in chest.  Diagnoses and all orders for this visit:  Cough  Dyspnea, unspecified type  Other fatigue  Other orders -     azithromycin (ZITHROMAX) 250 MG tablet; 2 tablets day 1, then 1 tablet days 2-4 -     albuterol  (PROVENTIL HFA;VENTOLIN HFA) 108 (90 Base) MCG/ACT inhaler; Inhale 2 puffs into the lungs every 6 (six) hours as needed for wheezing or shortness of breath.

## 2017-08-23 DIAGNOSIS — C8190 Hodgkin lymphoma, unspecified, unspecified site: Secondary | ICD-10-CM | POA: Diagnosis not present

## 2017-08-23 DIAGNOSIS — I341 Nonrheumatic mitral (valve) prolapse: Secondary | ICD-10-CM | POA: Diagnosis not present

## 2017-08-23 DIAGNOSIS — R232 Flushing: Secondary | ICD-10-CM | POA: Diagnosis not present

## 2017-08-23 DIAGNOSIS — N6314 Unspecified lump in the right breast, lower inner quadrant: Secondary | ICD-10-CM | POA: Diagnosis not present

## 2017-08-23 DIAGNOSIS — Z8571 Personal history of Hodgkin lymphoma: Secondary | ICD-10-CM | POA: Diagnosis not present

## 2017-08-23 DIAGNOSIS — E039 Hypothyroidism, unspecified: Secondary | ICD-10-CM | POA: Diagnosis not present

## 2017-11-09 ENCOUNTER — Other Ambulatory Visit: Payer: Self-pay

## 2017-11-09 ENCOUNTER — Ambulatory Visit (HOSPITAL_COMMUNITY): Payer: BLUE CROSS/BLUE SHIELD | Attending: Cardiology

## 2017-11-09 DIAGNOSIS — I341 Nonrheumatic mitral (valve) prolapse: Secondary | ICD-10-CM | POA: Diagnosis not present

## 2017-11-09 DIAGNOSIS — J449 Chronic obstructive pulmonary disease, unspecified: Secondary | ICD-10-CM | POA: Diagnosis not present

## 2017-11-09 DIAGNOSIS — Z87891 Personal history of nicotine dependence: Secondary | ICD-10-CM | POA: Diagnosis not present

## 2017-11-09 DIAGNOSIS — I340 Nonrheumatic mitral (valve) insufficiency: Secondary | ICD-10-CM | POA: Insufficient documentation

## 2017-11-09 DIAGNOSIS — Z9882 Breast implant status: Secondary | ICD-10-CM | POA: Diagnosis not present

## 2017-11-09 DIAGNOSIS — Z8571 Personal history of Hodgkin lymphoma: Secondary | ICD-10-CM | POA: Insufficient documentation

## 2017-11-12 ENCOUNTER — Ambulatory Visit: Payer: BLUE CROSS/BLUE SHIELD | Admitting: Cardiology

## 2017-11-12 DIAGNOSIS — R0989 Other specified symptoms and signs involving the circulatory and respiratory systems: Secondary | ICD-10-CM

## 2017-11-13 ENCOUNTER — Encounter: Payer: Self-pay | Admitting: *Deleted

## 2017-11-13 DIAGNOSIS — Z01419 Encounter for gynecological examination (general) (routine) without abnormal findings: Secondary | ICD-10-CM | POA: Diagnosis not present

## 2017-11-13 DIAGNOSIS — Z1151 Encounter for screening for human papillomavirus (HPV): Secondary | ICD-10-CM | POA: Diagnosis not present

## 2017-11-13 DIAGNOSIS — Z6828 Body mass index (BMI) 28.0-28.9, adult: Secondary | ICD-10-CM | POA: Diagnosis not present

## 2017-11-22 ENCOUNTER — Telehealth: Payer: BLUE CROSS/BLUE SHIELD | Admitting: Family Medicine

## 2017-11-22 DIAGNOSIS — R6889 Other general symptoms and signs: Secondary | ICD-10-CM

## 2017-11-22 MED ORDER — OSELTAMIVIR PHOSPHATE 75 MG PO CAPS: 75.0000 mg | ORAL_CAPSULE | Freq: Two times a day (BID) | ORAL | 0 refills | Status: AC

## 2017-11-22 NOTE — Progress Notes (Signed)
E visit for Flu like symptoms   We are sorry that you are not feeling well.  Here is how we plan to help! Based on what you have shared with me it looks like you may have a respiratory virus that may be influenza.  I have prescribed medication for this and condition and if this medication is not effective please follow up in person if symptoms are not improved in the next 48 hours.  Influenza or "the flu" is   an infection caused by a respiratory virus. The flu virus is highly contagious and persons who did not receive their yearly flu vaccination may "catch" the flu from close contact.  We have anti-viral medications to treat the viruses that cause this infection. They are not a "cure" and only shorten the course of the infection. These prescriptions are most effective when they are given within the first 2 days of "flu" symptoms. Antiviral medication are indicated if you have a high risk of complications from the flu. You should  also consider an antiviral medication if you are in close contact with someone who is at risk. These medications can help patients avoid complications from the flu  but have side effects that you should know. Possible side effects from Tamiflu or oseltamivir include nausea, vomiting, diarrhea, dizziness, headaches, eye redness, sleep problems or other respiratory symptoms. You should not take Tamiflu if you have an allergy to oseltamivir or any to the ingredients in Tamiflu.  Based upon your symptoms and potential risk factors I have prescribed Oseltamivir (Tamiflu).  It has been sent to your designated pharmacy.  You will take one 75 mg capsule orally twice a day for the next 5 days.  ANYONE WHO HAS FLU SYMPTOMS SHOULD: . Stay home. The flu is highly contagious and going out or to work exposes others! . Be sure to drink plenty of fluids. Water is fine as well as fruit juices, sodas and electrolyte beverages. You may want to stay away from caffeine or alcohol. If you are  nauseated, try taking small sips of liquids. How do you know if you are getting enough fluid? Your urine should be a pale yellow or almost colorless. . Get rest. . Taking a steamy shower or using a humidifier may help nasal congestion and ease sore throat pain. Using a saline nasal spray works much the same way. . Cough drops, hard candies and sore throat lozenges may ease your cough. . Line up a caregiver. Have someone check on you regularly.   GET HELP RIGHT AWAY IF: . You cannot keep down liquids or your medications. . You become short of breath . Your fell like you are going to pass out or loose consciousness. . Your symptoms persist after you have completed your treatment plan MAKE SURE YOU   Understand these instructions.  Will watch your condition.  Will get help right away if you are not doing well or get worse.  Your e-visit answers were reviewed by a board certified advanced clinical practitioner to complete your personal care plan.  Depending on the condition, your plan could have included both over the counter or prescription medications.  If there is a problem please reply  once you have received a response from your provider.  Your safety is important to [place].  If you have drug allergies check your prescription carefully.    You can use MyChart to ask questions about today's visit, request a non-urgent call back, or ask for a work or  school excuse for 24 hours related to this e-Visit. If it has been greater than 24 hours you will need to follow up with your provider, or enter a new e-Visit to address those concerns.  You will get an e-mail in the next two days asking about your experience.  I hope that your e-visit has been valuable and will speed your recovery. Thank you for using e-visits.

## 2017-12-06 DIAGNOSIS — D259 Leiomyoma of uterus, unspecified: Secondary | ICD-10-CM | POA: Diagnosis not present

## 2017-12-06 DIAGNOSIS — N946 Dysmenorrhea, unspecified: Secondary | ICD-10-CM | POA: Diagnosis not present

## 2017-12-06 DIAGNOSIS — N920 Excessive and frequent menstruation with regular cycle: Secondary | ICD-10-CM | POA: Diagnosis not present

## 2017-12-06 DIAGNOSIS — R9389 Abnormal findings on diagnostic imaging of other specified body structures: Secondary | ICD-10-CM | POA: Diagnosis not present

## 2017-12-07 ENCOUNTER — Encounter: Payer: Self-pay | Admitting: Medical

## 2017-12-07 ENCOUNTER — Ambulatory Visit: Payer: BLUE CROSS/BLUE SHIELD | Admitting: Medical

## 2017-12-07 VITALS — BP 130/88 | HR 88 | Temp 98.0°F | Ht 66.0 in | Wt 175.2 lb

## 2017-12-07 DIAGNOSIS — J0100 Acute maxillary sinusitis, unspecified: Secondary | ICD-10-CM | POA: Insufficient documentation

## 2017-12-07 DIAGNOSIS — J029 Acute pharyngitis, unspecified: Secondary | ICD-10-CM | POA: Insufficient documentation

## 2017-12-07 LAB — POCT RAPID STREP A (OFFICE): Rapid Strep A Screen: NEGATIVE

## 2017-12-07 MED ORDER — AMOXICILLIN 875 MG PO TABS: 875.0000 mg | ORAL_TABLET | Freq: Two times a day (BID) | ORAL | 0 refills | Status: DC

## 2017-12-07 NOTE — Progress Notes (Signed)
Subjective: Lisa Waters is a 37 y.o. female who presents for evaluation of sore throat. Here for 2-week history of sore throat.  She did have some allergy symptoms a few weeks ago with runny nose and congestion but that can have improved.  Currently she has a bad sore throat, scratchy throat, hurts to yawn, some ear pressure.  But no cough no nausea or vomiting no fever no shortness of breath.  No sick contacts.  Using Mucinex without improvement.  Non-smoker. No other aggravating or relieving factors.  No other c/o.   The following portions of the patient's history were reviewed and updated as appropriate: allergies, current medications, past medical history, past social history, past surgical history and problem list.  Past Medical History:  Diagnosis Date  . Anxiety   . Asthma    mild intermittent as of 07/2017  . Depression   . GERD (gastroesophageal reflux disease)   . H/O amaurosis fugax Fall 2012   MRI of brain/brain stem: no acute abnormality, small area of encephalomalacia left superior cerebellum that could be prior trauma, infection, or lacunar infarct  . History of Hodgkin's lymphoma 2007   in remission, sees [facility], prior with Dr. Marcell Anger; prior chemotherapy and radiation therapy; sees yearly in December  . Hypothyroidism   . Migraine    associated ocular symptoms, Dr. Lethea Killings, [facility]  . Moderate mitral regurgitation by prior echocardiogram 09/2010   10/2015: EF 55-60%. Normal WM. Normal D Fxn. Bilateral MVP w/ Mod MR (very eccentric). Normal LA, RV & RA. Normal PAP.  Marland Kitchen MVP (mitral valve prolapse) 09/2010   Dr. Ellyn Hack, [facility]; a) Echo 3/'12: Mod MR; EF 60-65%; b) TEE 5/'12: Nl LV Fxn, EF 60-65%, mild, holosystolic prolapse of the medial anterior leaflet. Mod MR . c) Echo 3/'15: Moderate, holosystolic prolapse of  medial. Mod MR d) TM Stress Echo (@ [facility]) Nl Lv Fxn, Mild-Mod MR @ Res0 --> Mod MR at HR 181 bpm (7:22 min, 10.10 METS) w/p WMA  . Oral ulcer   . Palpitations  4/14   holter monitor, Dr. Ellyn Hack; no arrhythmia    Current Outpatient Medications on File Prior to Visit  Medication Sig Dispense Refill  . albuterol (PROVENTIL HFA;VENTOLIN HFA) 108 (90 Base) MCG/ACT inhaler Inhale 2 puffs into the lungs every 6 (six) hours as needed for wheezing or shortness of breath. 1 Inhaler 0  . sertraline (ZOLOFT) 100 MG tablet Take 1 tablet (100 mg total) by mouth daily. 90 tablet 3  . SYNTHROID 88 MCG tablet Take 1 tablet (88 mcg total) by mouth daily. 90 tablet 3  . Vitamin D, Ergocalciferol, (DRISDOL) 50000 units CAPS capsule Take 1 capsule (50,000 Units total) by mouth every 7 (seven) days. 12 capsule 3  . fluticasone (FLONASE) 50 MCG/ACT nasal spray Place 2 sprays into both nostrils 2 (two) times daily. 16 g 6   No current facility-administered medications on file prior to visit.     ROS as in subjective    Objective: BP 130/88   Pulse 88   Temp 98 F (36.7 C) (Oral)   Ht 5\' 6"  (1.676 m)   Wt 175 lb 3.2 oz (79.5 kg)   SpO2 99%   BMI 28.28 kg/m   General appearance: no distress, WD/WN, mildly ill-appearing HEENT: normocephalic, conjunctiva/corneas normal, sclerae anicteric, nares patent,mucoid discharge , + erythema, pharynx with mild erythema and mild post nasal drainage  Oral cavity: MMM, no lesions  Neck: supple, no lymphadenopathy, no thyromegaly Lungs: CTA bilaterally, no wheezes, rhonchi,  or rales    Laboratory Strep test done. Results:negative.     Assessment: Encounter Diagnoses  Name Primary?  . Sore throat Yes  . Acute non-recurrent maxillary sinusitis      Plan: Exam and symptoms suggestive of sinusitis and postnasal drainage.  Begin amoxicillin, rest, hydrate well, can use over-the-counter Zyrtec or Benadryl for the next several nights.  Call or return if worse or not improving  [name] was seen today for sore throat.  Diagnoses and all orders for this visit:  Sore throat -     Rapid Strep A  Acute non-recurrent  maxillary sinusitis  Other orders -     amoxicillin (AMOXIL) 875 MG tablet; Take 1 tablet (875 mg total) by mouth 2 (two) times daily.

## 2018-01-08 DIAGNOSIS — N921 Excessive and frequent menstruation with irregular cycle: Secondary | ICD-10-CM | POA: Diagnosis not present

## 2018-01-08 DIAGNOSIS — R9389 Abnormal findings on diagnostic imaging of other specified body structures: Secondary | ICD-10-CM | POA: Diagnosis not present

## 2018-01-14 ENCOUNTER — Other Ambulatory Visit: Payer: Self-pay | Admitting: Obstetrics and Gynecology

## 2018-01-18 NOTE — Patient Instructions (Addendum)
Your procedure is scheduled on: Tuesday, July 16  Enter through the [employer] of [facility] at: [place] up the phone at the desk and dial [phone].  Call this number if you have problems the morning of surgery: [phone].  Remember: Do NOT eat food after midnight Monday  Do NOT drink clear liquids (including water) after: 7:30 am Tuesday, day of surgery  Take these medicines the morning of surgery with a SIP OF WATER: synthroid  Bring albuterol inhaler with you on day of surgery.  Stop herbal medications, vitamin supplements, Ibuprofen/NSAIDS at this time.  Do NOT wear jewelry (body piercing), metal hair clips/bobby pins, make-up, or nail polish. Do NOT wear lotions, powders, or perfumes.  You may wear deoderant. Do NOT shave for 48 hours prior to surgery. Do NOT bring valuables to the hospital.  Have a responsible adult drive you home and stay with you for 24 hours after your procedure.  Home with Husband Thurmond Butts cell [phone]

## 2018-01-23 ENCOUNTER — Encounter (HOSPITAL_COMMUNITY): Payer: Self-pay

## 2018-01-23 ENCOUNTER — Encounter (HOSPITAL_COMMUNITY)
Admission: RE | Admit: 2018-01-23 | Discharge: 2018-01-23 | Disposition: A | Discharge status: Home / Self Care | Payer: BLUE CROSS/BLUE SHIELD | Source: Ambulatory Visit | Attending: Obstetrics and Gynecology | Admitting: Obstetrics and Gynecology

## 2018-01-23 ENCOUNTER — Other Ambulatory Visit: Payer: Self-pay

## 2018-01-23 DIAGNOSIS — Z01812 Encounter for preprocedural laboratory examination: Secondary | ICD-10-CM | POA: Insufficient documentation

## 2018-01-23 HISTORY — DX: Hyperlipidemia, unspecified: E78.5

## 2018-01-23 HISTORY — DX: Female infertility, unspecified: N97.9

## 2018-01-23 LAB — CBC
HCT: 36.9 % (ref 36.0–46.0)
Hemoglobin: 12.4 g/dL (ref 12.0–15.0)
MCH: 31.2 pg (ref 26.0–34.0)
MCHC: 33.6 g/dL (ref 30.0–36.0)
MCV: 92.9 fL (ref 78.0–100.0)
PLATELETS: 193 10*3/uL (ref 150–400)
RBC: 3.97 MIL/uL (ref 3.87–5.11)
RDW: 13.5 % (ref 11.5–15.5)
WBC: 6.2 10*3/uL (ref 4.0–10.5)

## 2018-01-29 ENCOUNTER — Ambulatory Visit (HOSPITAL_COMMUNITY)
Admission: AD | Admit: 2018-01-29 | Discharge: 2018-01-29 | Disposition: A | Discharge status: Home / Self Care | Payer: BLUE CROSS/BLUE SHIELD | Source: Ambulatory Visit | Attending: Obstetrics and Gynecology | Admitting: Obstetrics and Gynecology

## 2018-01-29 ENCOUNTER — Ambulatory Visit (HOSPITAL_COMMUNITY): Payer: BLUE CROSS/BLUE SHIELD | Admitting: Certified Registered Nurse Anesthetist

## 2018-01-29 ENCOUNTER — Encounter (HOSPITAL_COMMUNITY)
Admission: AD | Disposition: A | Discharge status: Home / Self Care | Payer: Self-pay | Source: Ambulatory Visit | Attending: Obstetrics and Gynecology

## 2018-01-29 ENCOUNTER — Other Ambulatory Visit: Payer: Self-pay

## 2018-01-29 ENCOUNTER — Encounter (HOSPITAL_COMMUNITY): Payer: Self-pay | Admitting: *Deleted

## 2018-01-29 DIAGNOSIS — I739 Peripheral vascular disease, unspecified: Secondary | ICD-10-CM | POA: Diagnosis not present

## 2018-01-29 DIAGNOSIS — E039 Hypothyroidism, unspecified: Secondary | ICD-10-CM | POA: Diagnosis not present

## 2018-01-29 DIAGNOSIS — Z87891 Personal history of nicotine dependence: Secondary | ICD-10-CM | POA: Insufficient documentation

## 2018-01-29 DIAGNOSIS — N840 Polyp of corpus uteri: Secondary | ICD-10-CM | POA: Insufficient documentation

## 2018-01-29 DIAGNOSIS — K219 Gastro-esophageal reflux disease without esophagitis: Secondary | ICD-10-CM | POA: Diagnosis not present

## 2018-01-29 DIAGNOSIS — J45909 Unspecified asthma, uncomplicated: Secondary | ICD-10-CM | POA: Insufficient documentation

## 2018-01-29 DIAGNOSIS — N921 Excessive and frequent menstruation with irregular cycle: Secondary | ICD-10-CM | POA: Diagnosis not present

## 2018-01-29 DIAGNOSIS — Z79899 Other long term (current) drug therapy: Secondary | ICD-10-CM | POA: Diagnosis not present

## 2018-01-29 DIAGNOSIS — F329 Major depressive disorder, single episode, unspecified: Secondary | ICD-10-CM | POA: Diagnosis not present

## 2018-01-29 DIAGNOSIS — F419 Anxiety disorder, unspecified: Secondary | ICD-10-CM | POA: Insufficient documentation

## 2018-01-29 DIAGNOSIS — N920 Excessive and frequent menstruation with regular cycle: Secondary | ICD-10-CM | POA: Insufficient documentation

## 2018-01-29 HISTORY — PX: DILATATION & CURETTAGE/HYSTEROSCOPY WITH MYOSURE: SHX6511

## 2018-01-29 LAB — HCG, SERUM, QUALITATIVE: Preg, Serum: NEGATIVE

## 2018-01-29 SURGERY — DILATATION & CURETTAGE/HYSTEROSCOPY WITH MYOSURE
Anesthesia: General | Site: Vagina

## 2018-01-29 MED ORDER — SCOPOLAMINE 1 MG/3DAYS TD PT72
MEDICATED_PATCH | TRANSDERMAL | Status: AC
  Administered 2018-01-29: 1.5 mg via TRANSDERMAL
  Filled 2018-01-29: qty 1

## 2018-01-29 MED ORDER — KETOROLAC TROMETHAMINE 30 MG/ML IJ SOLN
INTRAMUSCULAR | Status: AC
  Filled 2018-01-29: qty 1

## 2018-01-29 MED ORDER — LIDOCAINE HCL (CARDIAC) PF 100 MG/5ML IV SOSY
PREFILLED_SYRINGE | INTRAVENOUS | Status: DC | PRN
  Administered 2018-01-29: 50 mg via INTRAVENOUS

## 2018-01-29 MED ORDER — SCOPOLAMINE 1 MG/3DAYS TD PT72
1.0000 | MEDICATED_PATCH | Freq: Once | TRANSDERMAL | Status: DC
  Administered 2018-01-29: 1.5 mg via TRANSDERMAL

## 2018-01-29 MED ORDER — DEXAMETHASONE SODIUM PHOSPHATE 4 MG/ML IJ SOLN
INTRAMUSCULAR | Status: AC
  Filled 2018-01-29: qty 1

## 2018-01-29 MED ORDER — FENTANYL CITRATE (PF) 100 MCG/2ML IJ SOLN: 25.0000 ug | INTRAMUSCULAR | Status: DC | PRN

## 2018-01-29 MED ORDER — MIDAZOLAM HCL 2 MG/2ML IJ SOLN
INTRAMUSCULAR | Status: AC
  Filled 2018-01-29: qty 2

## 2018-01-29 MED ORDER — LACTATED RINGERS IV SOLN
INTRAVENOUS | Status: DC
  Administered 2018-01-29 (×2): via INTRAVENOUS

## 2018-01-29 MED ORDER — FENTANYL CITRATE (PF) 100 MCG/2ML IJ SOLN
INTRAMUSCULAR | Status: AC
  Filled 2018-01-29: qty 2

## 2018-01-29 MED ORDER — MIDAZOLAM HCL 2 MG/2ML IJ SOLN
INTRAMUSCULAR | Status: DC | PRN
  Administered 2018-01-29: 2 mg via INTRAVENOUS
  Administered 2018-01-29 (×2): 1 mg via INTRAVENOUS
  Administered 2018-01-29: 2 mg via INTRAVENOUS

## 2018-01-29 MED ORDER — PROPOFOL 10 MG/ML IV BOLUS
INTRAVENOUS | Status: DC | PRN
  Administered 2018-01-29: 200 mg via INTRAVENOUS

## 2018-01-29 MED ORDER — ONDANSETRON HCL 4 MG/2ML IJ SOLN
INTRAMUSCULAR | Status: DC | PRN
  Administered 2018-01-29: 4 mg via INTRAVENOUS

## 2018-01-29 MED ORDER — LIDOCAINE HCL (CARDIAC) PF 100 MG/5ML IV SOSY
PREFILLED_SYRINGE | INTRAVENOUS | Status: AC
  Filled 2018-01-29: qty 5

## 2018-01-29 MED ORDER — SODIUM CHLORIDE 0.9 % IR SOLN
Status: DC | PRN
  Administered 2018-01-29: 3000 mL

## 2018-01-29 MED ORDER — DEXAMETHASONE SODIUM PHOSPHATE 10 MG/ML IJ SOLN
INTRAMUSCULAR | Status: DC | PRN
  Administered 2018-01-29: 4 mg via INTRAVENOUS

## 2018-01-29 MED ORDER — ONDANSETRON HCL 4 MG/2ML IJ SOLN: 4.0000 mg | Freq: Four times a day (QID) | INTRAMUSCULAR | Status: DC | PRN

## 2018-01-29 MED ORDER — OXYCODONE-ACETAMINOPHEN 5-325 MG PO TABS: 1.0000 | ORAL_TABLET | Freq: Four times a day (QID) | ORAL | 0 refills | Status: DC | PRN

## 2018-01-29 MED ORDER — ONDANSETRON HCL 4 MG/2ML IJ SOLN
INTRAMUSCULAR | Status: AC
  Filled 2018-01-29: qty 2

## 2018-01-29 MED ORDER — OXYCODONE HCL 5 MG PO TABS
ORAL_TABLET | ORAL | Status: AC
  Filled 2018-01-29: qty 1

## 2018-01-29 MED ORDER — IBUPROFEN 800 MG PO TABS: 800.0000 mg | ORAL_TABLET | Freq: Three times a day (TID) | ORAL | 0 refills | Status: DC | PRN

## 2018-01-29 MED ORDER — OXYCODONE HCL 5 MG/5ML PO SOLN: 5.0000 mg | Freq: Once | ORAL | Status: DC | PRN

## 2018-01-29 MED ORDER — PROPOFOL 10 MG/ML IV BOLUS
INTRAVENOUS | Status: AC
  Filled 2018-01-29: qty 20

## 2018-01-29 MED ORDER — KETOROLAC TROMETHAMINE 30 MG/ML IJ SOLN
INTRAMUSCULAR | Status: DC | PRN
  Administered 2018-01-29: 30 mg via INTRAVENOUS

## 2018-01-29 MED ORDER — OXYCODONE HCL 5 MG PO TABS: 5.0000 mg | ORAL_TABLET | Freq: Once | ORAL | Status: DC | PRN

## 2018-01-29 MED ORDER — FENTANYL CITRATE (PF) 100 MCG/2ML IJ SOLN
INTRAMUSCULAR | Status: DC | PRN
  Administered 2018-01-29 (×2): 50 ug via INTRAVENOUS
  Administered 2018-01-29: 100 ug via INTRAVENOUS

## 2018-01-29 SURGICAL SUPPLY — 17 items
CANISTER SUCT 3000ML PPV (MISCELLANEOUS) ×2 IMPLANT
CATH ROBINSON RED A/P 16FR (CATHETERS) ×2 IMPLANT
DEVICE MYOSURE LITE (MISCELLANEOUS) IMPLANT
DEVICE MYOSURE REACH (MISCELLANEOUS) IMPLANT
FILTER ARTHROSCOPY CONVERTOR (FILTER) ×2 IMPLANT
GLOVE BIOGEL PI IND STRL 6.5 (GLOVE) ×1 IMPLANT
GLOVE BIOGEL PI IND STRL 7.0 (GLOVE) ×1 IMPLANT
GLOVE BIOGEL PI INDICATOR 6.5 (GLOVE) ×1
GLOVE BIOGEL PI INDICATOR 7.0 (GLOVE) ×1
GLOVE ECLIPSE 6.5 STRL STRAW (GLOVE) ×2 IMPLANT
GOWN STRL REUS W/TWL LRG LVL3 (GOWN DISPOSABLE) ×4 IMPLANT
PACK VAGINAL MINOR WOMEN LF (CUSTOM PROCEDURE TRAY) ×2 IMPLANT
PAD OB MATERNITY 4.3X12.25 (PERSONAL CARE ITEMS) ×2 IMPLANT
SEAL ROD LENS SCOPE MYOSURE (ABLATOR) ×2 IMPLANT
TOWEL OR 17X24 6PK STRL BLUE (TOWEL DISPOSABLE) ×4 IMPLANT
TUBING AQUILEX INFLOW (TUBING) ×2 IMPLANT
TUBING AQUILEX OUTFLOW (TUBING) ×2 IMPLANT

## 2018-01-29 NOTE — Transfer of Care (Signed)
Immediate Anesthesia Transfer of Care Note  Patient: Lisa Waters  Procedure(s) Performed: DILATATION & CURETTAGE/HYSTEROSCOPY WITH MYOSURE POLYPECTOMY (N/A Vagina )  Patient Location: PACU  Anesthesia Type:General  Level of Consciousness: awake, alert  and oriented  Airway & Oxygen Therapy: Patient Spontanous Breathing and Patient connected to nasal cannula oxygen  Post-op Assessment: Report given to RN and Post -op Vital signs reviewed and stable  Post vital signs: Reviewed and stable  Last Vitals:  Vitals Value Taken Time  BP 125/86 01/29/2018  2:45 PM  Temp    Pulse 107 01/29/2018  2:48 PM  Resp 19 01/29/2018  2:48 PM  SpO2 100 % 01/29/2018  2:48 PM  Vitals shown include unvalidated device data.  Last Pain:  Vitals:   01/29/18 1212  TempSrc: Oral  PainSc: 0-No pain      Patients Stated Pain Goal: 3 (33/35/45 6256)  Complications: No apparent anesthesia complications

## 2018-01-29 NOTE — Anesthesia Procedure Notes (Signed)
Procedure Name: LMA Insertion Date/Time: 01/29/2018 1:59 PM Performed by: Bufford Spikes, CRNA Pre-anesthesia Checklist: Patient identified, Emergency Drugs available, Suction available and Patient being monitored Patient Re-evaluated:Patient Re-evaluated prior to induction Oxygen Delivery Method: Circle system utilized Preoxygenation: Pre-oxygenation with 100% oxygen Induction Type: IV induction Ventilation: Mask ventilation without difficulty LMA: LMA inserted LMA Size: 4.0 Number of attempts: 1 Airway Equipment and Method: Bite block Placement Confirmation: positive ETCO2 Tube secured with: Tape Dental Injury: Teeth and Oropharynx as per pre-operative assessment

## 2018-01-29 NOTE — Discharge Instructions (Signed)
DISCHARGE INSTRUCTIONS: HYSTEROSCOPY / ENDOMETRIAL ABLATION The following instructions have been prepared to help you care for yourself upon your return home.  May Remove Scop patch on or before Friday Morning (7/19)  May take Ibuprofen after 10 PM today  May take stool softner while taking narcotic pain medication to prevent constipation.  Drink plenty of water.  Personal hygiene:  Use sanitary pads for vaginal drainage, not tampons.  Shower the day after your procedure.  NO tub baths, pools or Jacuzzis for 2-3 weeks.  Wipe front to back after using the bathroom.  Activity and limitations:  Do NOT drive or operate any equipment for 24 hours. The effects of anesthesia are still present and drowsiness may result.  Do NOT rest in bed all day.  Walking is encouraged.  Walk up and down stairs slowly.  You may resume your normal activity in one to two days or as indicated by your physician. Sexual activity: NO intercourse for at least 2 weeks after the procedure, or as indicated by your Doctor.  Diet: Eat a light meal as desired this evening. You may resume your usual diet tomorrow.  Return to Work: You may resume your work activities in one to two days or as indicated by Marine scientist.  What to expect after your surgery: Expect to have vaginal bleeding/discharge for 2-3 days and spotting for up to 10 days. It is not unusual to have soreness for up to 1-2 weeks. You may have a slight burning sensation when you urinate for the first day. Mild cramps may continue for a couple of days. You may have a regular period in 2-6 weeks.  Call your doctor for any of the following:  Excessive vaginal bleeding or clotting, saturating and changing one pad every hour.  Inability to urinate 6 hours after discharge from hospital.  Pain not relieved by pain medication.  Fever of 100.4 F or greater.  Unusual vaginal discharge or odor.   Post Anesthesia Home Care  Instructions  Activity: Get plenty of rest for the remainder of the day. A responsible individual must stay with you for 24 hours following the procedure.  For the next 24 hours, DO NOT: -Drive a car -Paediatric nurse -Drink alcoholic beverages -Take any medication unless instructed by your physician -Make any legal decisions or sign important papers.  Meals: Start with liquid foods such as gelatin or soup. Progress to regular foods as tolerated. Avoid greasy, spicy, heavy foods. If nausea and/or vomiting occur, drink only clear liquids until the nausea and/or vomiting subsides. Call your physician if vomiting continues.  Special Instructions/Symptoms: Your throat may feel dry or sore from the anesthesia or the breathing tube placed in your throat during surgery. If this causes discomfort, gargle with warm salt water. The discomfort should disappear within 24 hours.  If you had a scopolamine patch placed behind your ear for the management of post- operative nausea and/or vomiting:  1. The medication in the patch is effective for 72 hours, after which it should be removed.  Wrap patch in a tissue and discard in the trash. Wash hands thoroughly with soap and water. 2. You may remove the patch earlier than 72 hours if you experience unpleasant side effects which may include dry mouth, dizziness or visual disturbances. 3. Avoid touching the patch. Wash your hands with soap and water after contact with the patch.

## 2018-01-29 NOTE — Op Note (Signed)
Preoperative diagnosis: Menometrorrhagia, endometrial polyp  Postop diagnosis: as above.  Procedure: Hysteroscopic polypectomy, D&C Anesthesia General via LMA  Surgeon: Tiana Loft, MD Assistant: none IV fluids : 11069ml Estimated blood loss : 75ml Urine output: straight catheter preop  : 32PQ Complications none  Condition stable  Disposition PACU  Specimen:endometrial polyp with endometrial curettings   Procedure  Indication: Menometrorhagia. Office SIS noted endometrial polyp about 2cm. Patient was counseled on risks/ complications including infection, bleeding, damage to internal organs, she understood and agrees, gave informed written consent.  Patient was brought to the operating room with IV running. Time out was carried out.  She underwent general anesthesia via LMA without complications. She was given dorsolithotomy position. Parts were prepped and draped in standard fashion. Bladder was catheterized once. Bimanual exam revealed uterus to be retroverted and normal size. Speculum was placed and cervix was grasped with single-tooth tenaculum.  The uterus was sounded to 8 cm. Cervical os was dilated to 15 [place] Pratt dilator. Hysteroscope was introduced in the uterine cavity under vision.   Findings: Endometrial thickening suspicious for poylp noted at right aspect of fundus.   Hysteroscopic polypectomy was performed with myosure and  Specimen sent to path. Cavity appeared regular contour and Hysteroscope was removed. Endometrial curettage was performed with myosure. All tissue sent to path.  Fluid deficit 600 cc - some fluid noted on floor and on towel.  All counts are correct x2. No complications. Patient was made supine dorsal anesthesia and brought to the recovery room in stable condition.  Patient will be discharged home today. Discharge meds ibuprofen and percocet. Follow up in 2 weeks in office. Warning signs of infection and excessive bleeding reviewed.   Tiana Loft,  MD

## 2018-01-29 NOTE — Anesthesia Preprocedure Evaluation (Signed)
Anesthesia Evaluation  Patient identified by MRN, date of birth, ID band Patient awake    Reviewed: Allergy & Precautions, H&P , NPO status , Patient's Chart, lab work & pertinent test results  Airway Mallampati: II   Neck ROM: full    Dental   Pulmonary asthma , former smoker,    breath sounds clear to auscultation       Cardiovascular + Peripheral Vascular Disease  + Valvular Problems/Murmurs MR and MVP  Rhythm:regular Rate:Normal  Moderate MR   Neuro/Psych PSYCHIATRIC DISORDERS Anxiety Depression    GI/Hepatic GERD  ,  Endo/Other  Hypothyroidism   Renal/GU      Musculoskeletal   Abdominal   Peds  Hematology   Anesthesia Other Findings   Reproductive/Obstetrics                             Anesthesia Physical Anesthesia Plan  ASA: II  Anesthesia Plan: General   Post-op Pain Management:    Induction: Intravenous  PONV Risk Score and Plan: 3 and Ondansetron, Dexamethasone, Midazolam and Treatment may vary due to age or medical condition  Airway Management Planned: LMA  Additional Equipment:   Intra-op Plan:   Post-operative Plan:   Informed Consent: I have reviewed the patients History and Physical, chart, labs and discussed the procedure including the risks, benefits and alternatives for the proposed anesthesia with the patient or authorized representative who has indicated his/her understanding and acceptance.     Plan Discussed with: CRNA, Anesthesiologist and Surgeon  Anesthesia Plan Comments:         Anesthesia Quick Evaluation

## 2018-01-29 NOTE — H&P (Signed)
Lisa Waters is an 37 y.o. female G1P0010 here with h/o menometrorrhagia and suspected endometrial polyp noted on SIS.  2 small 1 and 2 cm fibroids also noted on u/s.  Patient here for D&C, h/s and polypectomy.  Pertinent Gynecological History: H/o EAB x1 age 5 Bc: none, h/o infertility  Menstrual History:  Patient's last menstrual period was 01/27/2018 (approximate).    Past Medical History:  Diagnosis Date  . Anxiety   . Asthma    mild intermittent as of 07/2017 - just when patient gets sick  . Depression   . Female infertility   . GERD (gastroesophageal reflux disease)    Hx - no current problems, no meds  . H/O amaurosis fugax Fall 2012   Resolved, no current problems, Hx-MRI of brain/brain stem: no acute abnormality, small area of encephalomalacia left superior cerebellum that could be prior trauma, infection, or lacunar infarct  . History of Hodgkin's lymphoma 2007   in remission, sees [facility], prior with Dr. Elwanda Brooklyn; prior chemotherapy and radiation therapy; sees yearly in December, no current problems  . Hyperlipidemia 07/2017   diet control, no meds  . Hypothyroidism   . Moderate mitral regurgitation by prior echocardiogram 09/2010   10/2015: EF 55-60%. Normal WM. Normal D Fxn. Bilateral MVP w/ Mod MR (very eccentric). Normal LA, RV & RA. Normal PAP.  Marland Kitchen MVP (mitral valve prolapse) 09/2010   Dr. Ellyn Hack, [facility]; a) Echo 3/'12: Mod MR; EF 60-65%; b) TEE 5/'12: Nl LV Fxn, EF 60-65%, mild, holosystolic prolapse of the medial anterior leaflet. Mod MR . c) Echo 3/'15: Moderate, holosystolic prolapse of  medial. Mod MR d) TM Stress Echo (@ [facility]) Nl Lv Fxn, Mild-Mod MR @ Res0 --> Mod MR at HR 181 bpm (7:22 min, 10.10 METS) w/p WMA  . Oral ulcer   . Palpitations 10/2012   holter monitor, Dr. Ellyn Hack; no arrhythmia, no current problems    Past Surgical History:  Procedure Laterality Date  . APPENDECTOMY  1993  . BREAST ENHANCEMENT SURGERY  2002  . COLONOSCOPY  07/2014   Dr.  Collene Mares - normal  . ESOPHAGOGASTRODUODENOSCOPY  04/11/13   [facility] Dr. Collene Mares  . FOOT SURGERY Bilateral    on toes  . INSERTION CENTRAL VENOUS ACCESS DEVICE W/ SUBCUTANEOUS PORT  2006  . LYMPH NODE BIOPSY  2006   under right arm  . NM MYOCAR PERF WALL MOTION  12/2010   persantine myoview - moderate breast attenuation (fixed mid-distal anterior defect), EF 68%, low risk scan  . RHINOPLASTY  2007   deviated septum  . TEE WITHOUT CARDIOVERSION  11/2010   Nl LV Fxn, EF 60-65%, mild, holosystolic prolapse of the medial anterior leaflet. Mod MR .   Marland Kitchen TRANSTHORACIC ECHOCARDIOGRAM  3/'14; 3/'15   LVEF 60-65%, wall motion normal, LV function normal, moderate holosystolic MVP (medial the middle scallop of the posterior leaflet), moderate regurgitation (directed posteriorly), no shunt -- stable over 2 years  . TRANSTHORACIC ECHOCARDIOGRAM  10/2015   EF 55-60%. Normal WM. Normal D Fxn. Bilateral MVP w/ Mod MR (very eccentric). Normal LA, RV & RA. Normal PAP.  . WISDOM TOOTH EXTRACTION      Family History  Problem Relation Age of Onset  . Skin cancer Father   . Cancer Paternal Grandmother        metastasis  . Alcohol abuse Mother   . Emphysema Paternal Grandfather        was a smoker, heart problems  . Diabetes Maternal Aunt   .  OCD Maternal Aunt   . Bipolar disorder Maternal Aunt   . Stroke Maternal Grandmother   . Cancer Maternal Aunt        skin  . Heart disease Neg Hx   . Hypertension Neg Hx   . Hyperlipidemia Neg Hx     Social History:  reports that she quit smoking about 8 years ago. Her smoking use included cigarettes. She has a 10.00 pack-year smoking history. She has never used smokeless tobacco. She reports that she drinks about 0.6 - 1.2 oz of alcohol per week. She reports that she does not use drugs.  Allergies:  Allergies  Allergen Reactions  . Codeine Itching  . Fish Allergy Itching    [place]  . Iohexol Hives  . Pindolol     Avoid non selective beta  blockers due to dyspnea  . Iodinated Diagnostic Agents Rash    Has received since rash developed without pre-meds without development of rash.    Medications Prior to Admission  Medication Sig Dispense Refill Last Dose  . sertraline (ZOLOFT) 100 MG tablet Take 1 tablet (100 mg total) by mouth daily. (Patient taking differently: Take 100 mg by mouth every evening. ) 90 tablet 3 01/28/2018 at Unknown time  . SYNTHROID 88 MCG tablet Take 1 tablet (88 mcg total) by mouth daily. 90 tablet 3 01/29/2018 at 0800  . Vitamin D, Ergocalciferol, (DRISDOL) 50000 units CAPS capsule Take 1 capsule (50,000 Units total) by mouth every 7 (seven) days. 12 capsule 3 Past Month at Unknown time  . albuterol (PROVENTIL HFA;VENTOLIN HFA) 108 (90 Base) MCG/ACT inhaler Inhale 2 puffs into the lungs every 6 (six) hours as needed for wheezing or shortness of breath. 1 Inhaler 0 More than a month at Unknown time  . amoxicillin (AMOXIL) 875 MG tablet Take 1 tablet (875 mg total) by mouth 2 (two) times daily. (Patient not taking: Reported on 01/21/2018) 20 tablet 0 Completed Course at Unknown time    ROS Denies SOB/chest pain/ HA/ vision changes/ LE pain or swelling/ etc.  Blood pressure 124/90, pulse 92, temperature 98.4 F (36.9 C), temperature source Oral, resp. rate 18, last menstrual period 01/27/2018, SpO2 99 %. Physical Exam A&O x 3 HEENT - grossly wnl Lungs : ctab CV: rrr Abdo : soft, nt, gravid Extr : no edema, nt bilat LE Pelvic : deferred   Results for orders placed or performed during the hospital encounter of 01/29/18 (from the past 24 hour(s))  hCG, serum, qualitative     Status: None   Collection Time: 01/29/18 12:00 PM  Result Value Ref Range   Preg, Serum NEGATIVE NEGATIVE   CBC Latest Ref Rng & Units 01/23/2018 07/26/2017 05/11/2016  WBC 4.0 - 10.5 K/uL 6.2 4.8 5.7  Hemoglobin 12.0 - 15.0 g/dL 12.4 13.2 12.6  Hematocrit 36.0 - 46.0 % 36.9 38.6 38.0  Platelets 150 - 400 K/uL 193 306 289     No  results found.  Assessment/Plan: 37 y/o G1P0010 with menometrorrhagia and suspected endometrial polyp, thickened endometrium 1. Proceed  To OR; risks reviewed including (but not limited to) bleeding, infection, injury to bowel, bladder, nerves, blood vessels, risk of anesthesia, risk of further surgery, risk of blood clot.  Consent signed  Charyl Bigger 01/29/2018, 1:24 PM

## 2018-01-31 NOTE — Anesthesia Postprocedure Evaluation (Signed)
Anesthesia Post Note  Patient: Lisa Waters  Procedure(s) Performed: DILATATION & CURETTAGE/HYSTEROSCOPY WITH MYOSURE POLYPECTOMY (N/A Vagina )     Patient location during evaluation: PACU Anesthesia Type: General Level of consciousness: awake and alert Pain management: pain level controlled Vital Signs Assessment: post-procedure vital signs reviewed and stable Respiratory status: spontaneous breathing, nonlabored ventilation, respiratory function stable and patient connected to nasal cannula oxygen Cardiovascular status: blood pressure returned to baseline and stable Postop Assessment: no apparent nausea or vomiting Anesthetic complications: no    Last Vitals:  Vitals:   01/29/18 1515 01/29/18 1548  BP: 121/80 125/77  Pulse: 93 86  Resp: 12 16  Temp:    SpO2: 97% 100%    Last Pain:  Vitals:   01/29/18 1500  TempSrc:   PainSc: [place]

## 2018-02-01 ENCOUNTER — Encounter (HOSPITAL_COMMUNITY): Payer: Self-pay | Admitting: Obstetrics and Gynecology

## 2018-02-12 DIAGNOSIS — R1032 Left lower quadrant pain: Secondary | ICD-10-CM | POA: Diagnosis not present

## 2018-02-12 DIAGNOSIS — N840 Polyp of corpus uteri: Secondary | ICD-10-CM | POA: Diagnosis not present

## 2018-03-11 DIAGNOSIS — R102 Pelvic and perineal pain: Secondary | ICD-10-CM | POA: Diagnosis not present

## 2018-03-11 DIAGNOSIS — Z01419 Encounter for gynecological examination (general) (routine) without abnormal findings: Secondary | ICD-10-CM | POA: Diagnosis not present

## 2018-03-11 DIAGNOSIS — Z1151 Encounter for screening for human papillomavirus (HPV): Secondary | ICD-10-CM | POA: Diagnosis not present

## 2018-03-11 DIAGNOSIS — Z124 Encounter for screening for malignant neoplasm of cervix: Secondary | ICD-10-CM | POA: Diagnosis not present

## 2018-03-27 DIAGNOSIS — Z79899 Other long term (current) drug therapy: Secondary | ICD-10-CM | POA: Diagnosis not present

## 2018-03-27 DIAGNOSIS — K120 Recurrent oral aphthae: Secondary | ICD-10-CM | POA: Diagnosis not present

## 2018-03-27 DIAGNOSIS — N9489 Other specified conditions associated with female genital organs and menstrual cycle: Secondary | ICD-10-CM | POA: Diagnosis not present

## 2018-04-04 ENCOUNTER — Ambulatory Visit: Payer: BLUE CROSS/BLUE SHIELD | Admitting: Medical

## 2018-04-05 ENCOUNTER — Ambulatory Visit: Payer: BLUE CROSS/BLUE SHIELD | Admitting: Medical

## 2018-05-02 DIAGNOSIS — N9489 Other specified conditions associated with female genital organs and menstrual cycle: Secondary | ICD-10-CM | POA: Diagnosis not present

## 2018-05-02 DIAGNOSIS — Z5181 Encounter for therapeutic drug level monitoring: Secondary | ICD-10-CM | POA: Diagnosis not present

## 2018-05-02 DIAGNOSIS — K120 Recurrent oral aphthae: Secondary | ICD-10-CM | POA: Diagnosis not present

## 2018-05-02 DIAGNOSIS — Z79899 Other long term (current) drug therapy: Secondary | ICD-10-CM | POA: Diagnosis not present

## 2018-05-13 ENCOUNTER — Telehealth: Payer: Self-pay | Admitting: Medical

## 2018-05-13 ENCOUNTER — Encounter: Payer: Self-pay | Admitting: Medical

## 2018-05-13 ENCOUNTER — Ambulatory Visit: Payer: BLUE CROSS/BLUE SHIELD | Admitting: Medical

## 2018-05-13 VITALS — BP 120/74 | HR 85 | Temp 98.1°F | Resp 16 | Ht 66.0 in | Wt 180.6 lb

## 2018-05-13 DIAGNOSIS — Z79899 Other long term (current) drug therapy: Secondary | ICD-10-CM

## 2018-05-13 DIAGNOSIS — Z2821 Immunization not carried out because of patient refusal: Secondary | ICD-10-CM | POA: Diagnosis not present

## 2018-05-13 DIAGNOSIS — K120 Recurrent oral aphthae: Secondary | ICD-10-CM

## 2018-05-13 LAB — POCT URINE PREGNANCY: PREG TEST UR: NEGATIVE

## 2018-05-13 NOTE — Telephone Encounter (Signed)
Fax her urine pregnancy "negative" result TODAY to the following:  Dr. Sharol Roussel Fax [phone]

## 2018-05-13 NOTE — Progress Notes (Signed)
Subjective: Chief Complaint  Patient presents with  . pregancy test    pregancy test need it faxed to [phone]   She has a history of painful debilitating ulcers in the mouth.  She was ultimately referred to Dr. Sharol Roussel at [facility] who is a specialist in this area.  She has been using Thalomid with good response.  However this requires routine lab surveillance including pregnancy test every 28 days.  It is closer for her to come here and have the pregnancy test done, rather than drive all the way [facility].  She has no other complaint.   Objective: BP 120/74   Pulse 85   Temp 98.1 F (36.7 C) (Oral)   Resp 16   Ht 5\' 6"  (1.676 m)   Wt 180 lb 9.6 oz (81.9 kg)   SpO2 98%   BMI 29.15 kg/m   General: Well-developed, well-nourished, no acute distress Otherwise not examined    Assessment: Encounter Diagnoses  Name Primary?  . Aphthous ulcer Yes  . High risk medication use   . Influenza vaccination declined     Plan: Urine pregnancy lab today.  She will need the urine pregnancy lab results sent STAT same day when she comes in for these visits once a month.  She lives a lot closer to our office than having to drive to [facility] so she may come in here every 28 days for urine pregnancy test for surveillance with lab results to be sent same day to the clinic in [place], [place].  Of note she declines flu shot today.  Urine pregnancy results to be faxed to the doctor below Dr. Sharol Roussel,  Fax: [phone]  Keeli was seen today for pregancy test.  Diagnoses and all orders for this visit:  Aphthous ulcer  High risk medication use -     POCT urine pregnancy  Influenza vaccination declined

## 2018-05-13 NOTE — Telephone Encounter (Signed)
Done

## 2018-05-20 ENCOUNTER — Ambulatory Visit (INDEPENDENT_AMBULATORY_CARE_PROVIDER_SITE_OTHER): Payer: BLUE CROSS/BLUE SHIELD | Admitting: Medical

## 2018-05-20 DIAGNOSIS — Z79899 Other long term (current) drug therapy: Secondary | ICD-10-CM

## 2018-05-20 LAB — POCT URINE PREGNANCY: Preg Test, Ur: NEGATIVE

## 2018-05-27 ENCOUNTER — Ambulatory Visit (INDEPENDENT_AMBULATORY_CARE_PROVIDER_SITE_OTHER): Payer: BLUE CROSS/BLUE SHIELD

## 2018-05-27 DIAGNOSIS — Z79899 Other long term (current) drug therapy: Secondary | ICD-10-CM

## 2018-05-27 LAB — POCT URINE PREGNANCY: PREG TEST UR: NEGATIVE

## 2018-06-04 ENCOUNTER — Other Ambulatory Visit: Payer: Self-pay | Admitting: Medical

## 2018-06-04 NOTE — Telephone Encounter (Signed)
Is this ok to refill?  

## 2018-06-04 NOTE — Telephone Encounter (Signed)
Refill the vitamin D, but the next time she comes in for the urine pregnancy test, go ahead and draw blood for vitamin D level

## 2018-06-05 DIAGNOSIS — K120 Recurrent oral aphthae: Secondary | ICD-10-CM | POA: Diagnosis not present

## 2018-06-07 ENCOUNTER — Telehealth: Payer: BLUE CROSS/BLUE SHIELD | Admitting: Family

## 2018-06-07 DIAGNOSIS — J029 Acute pharyngitis, unspecified: Secondary | ICD-10-CM

## 2018-06-07 MED ORDER — ALBUTEROL SULFATE HFA 108 (90 BASE) MCG/ACT IN AERS: 2.0000 | INHALATION_SPRAY | Freq: Four times a day (QID) | RESPIRATORY_TRACT | 2 refills | Status: DC | PRN

## 2018-06-07 MED ORDER — BENZONATATE 100 MG PO CAPS: 100.0000 mg | ORAL_CAPSULE | Freq: Three times a day (TID) | ORAL | 0 refills | Status: DC | PRN

## 2018-06-07 NOTE — Progress Notes (Signed)
Thank you for the details you included in the comment boxes. Those details are very helpful in determining the best course of treatment for you and help [place] to provide the best care. Inhaler refill also sent.   We are sorry that you are not feeling well.  Here is how we plan to help!  Based on your presentation I believe you most likely have A cough due to a virus.  This is called viral bronchitis and is best treated by rest, plenty of fluids and control of the cough.  You may use Ibuprofen or Tylenol as directed to help your symptoms.     In addition you may use A non-prescription cough medication called Mucinex DM: take 2 tablets every 12 hours. and A prescription cough medication called Tessalon Perles 100mg . You may take 1-2 capsules every 8 hours as needed for your cough.   From your responses in the eVisit questionnaire you describe inflammation in the upper respiratory tract which is causing a significant cough.  This is commonly called Bronchitis and has four common causes:    Allergies  Viral Infections  Acid Reflux  Bacterial Infection Allergies, viruses and acid reflux are treated by controlling symptoms or eliminating the cause. An example might be a cough caused by taking certain blood pressure medications. You stop the cough by changing the medication. Another example might be a cough caused by acid reflux. Controlling the reflux helps control the cough.  USE OF BRONCHODILATOR ("RESCUE") INHALERS: There is a risk from using your bronchodilator too frequently.  The risk is that over-reliance on a medication which only relaxes the muscles surrounding the breathing tubes can reduce the effectiveness of medications prescribed to reduce swelling and congestion of the tubes themselves.  Although you feel brief relief from the bronchodilator inhaler, your asthma may actually be worsening with the tubes becoming more swollen and filled with mucus.  This can delay other crucial treatments,  such as oral steroid medications. If you need to use a bronchodilator inhaler daily, several times per day, you should discuss this with your provider.  There are probably better treatments that could be used to keep your asthma under control.     HOME CARE . Only take medications as instructed by your medical team. . Complete the entire course of an antibiotic. . Drink plenty of fluids and get plenty of rest. . Avoid close contacts especially the very young and the elderly . Cover your mouth if you cough or cough into your sleeve. . Always remember to wash your hands . A steam or ultrasonic humidifier can help congestion.   GET HELP RIGHT AWAY IF: . You develop worsening fever. . You become short of breath . You cough up blood. . Your symptoms persist after you have completed your treatment plan MAKE SURE YOU   Understand these instructions.  Will watch your condition.  Will get help right away if you are not doing well or get worse.  Your e-visit answers were reviewed by a board certified advanced clinical practitioner to complete your personal care plan.  Depending on the condition, your plan could have included both over the counter or prescription medications. If there is a problem please reply  once you have received a response from your provider. Your safety is important to [place].  If you have drug allergies check your prescription carefully.    You can use MyChart to ask questions about today's visit, request a non-urgent call back, or ask for a  work or school excuse for 24 hours related to this e-Visit. If it has been greater than 24 hours you will need to follow up with your provider, or enter a new e-Visit to address those concerns. You will get an e-mail in the next two days asking about your experience.  I hope that your e-visit has been valuable and will speed your recovery. Thank you for using e-visits.   

## 2018-06-10 ENCOUNTER — Ambulatory Visit: Payer: BLUE CROSS/BLUE SHIELD | Admitting: Medical

## 2018-06-10 ENCOUNTER — Encounter: Payer: Self-pay | Admitting: Medical

## 2018-06-10 VITALS — BP 130/80 | HR 84 | Temp 98.1°F | Resp 16 | Ht 66.0 in | Wt 184.2 lb

## 2018-06-10 DIAGNOSIS — J329 Chronic sinusitis, unspecified: Secondary | ICD-10-CM | POA: Diagnosis not present

## 2018-06-10 DIAGNOSIS — N9489 Other specified conditions associated with female genital organs and menstrual cycle: Secondary | ICD-10-CM | POA: Diagnosis not present

## 2018-06-10 DIAGNOSIS — Z5181 Encounter for therapeutic drug level monitoring: Secondary | ICD-10-CM | POA: Diagnosis not present

## 2018-06-10 DIAGNOSIS — Z79899 Other long term (current) drug therapy: Secondary | ICD-10-CM | POA: Diagnosis not present

## 2018-06-10 DIAGNOSIS — J40 Bronchitis, not specified as acute or chronic: Secondary | ICD-10-CM

## 2018-06-10 DIAGNOSIS — L403 Pustulosis palmaris et plantaris: Secondary | ICD-10-CM | POA: Diagnosis not present

## 2018-06-10 DIAGNOSIS — K120 Recurrent oral aphthae: Secondary | ICD-10-CM | POA: Diagnosis not present

## 2018-06-10 MED ORDER — PROMETHAZINE-DM 6.25-15 MG/5ML PO SYRP: 5.0000 mL | ORAL_SOLUTION | Freq: Four times a day (QID) | ORAL | 0 refills | Status: DC | PRN

## 2018-06-10 MED ORDER — CEFUROXIME AXETIL 500 MG PO TABS: 500.0000 mg | ORAL_TABLET | Freq: Two times a day (BID) | ORAL | 0 refills | Status: DC

## 2018-06-10 NOTE — Progress Notes (Signed)
Subjective: Chief Complaint  Patient presents with  . cold    cough, congestion, sore throat, runny nose X 1 week   Here for cold symptoms, worsening x 1 week.  Was out of town in [place], got sick.   Did E-visit.  Was prescribed inhaler and cough medication.  Is having hard time catching breath, coughing up green mucous, annoying cough, runny nose.  No fever.  Some nausea and vomiting due to phlegm stuck in throat.  Using inhaler last few days.  Has ear pressure, sinus pressure.   Has had some body aches and chills.   No other aggravating or relieving factors. No other complaint.   Past Medical History:  Diagnosis Date  . Anxiety   . Asthma    mild intermittent as of 07/2017 - just when patient gets sick  . Depression   . Female infertility   . GERD (gastroesophageal reflux disease)    Hx - no current problems, no meds  . H/O amaurosis fugax Fall 2012   Resolved, no current problems, Hx-MRI of brain/brain stem: no acute abnormality, small area of encephalomalacia left superior cerebellum that could be prior trauma, infection, or lacunar infarct  . History of Hodgkin's lymphoma 2007   in remission, sees [facility], prior with Dr. Elwanda [place]; prior chemotherapy and radiation therapy; sees yearly in December, no current problems  . Hyperlipidemia 07/2017   diet control, no meds  . Hypothyroidism   . Moderate mitral regurgitation by prior echocardiogram 09/2010   10/2015: EF 55-60%. Normal WM. Normal D Fxn. Bilateral MVP w/ Mod MR (very eccentric). Normal LA, RV & RA. Normal PAP.  Marland Kitchen MVP (mitral valve prolapse) 09/2010   Dr. Ellyn Hack, [facility]; a) Echo 3/'12: Mod MR; EF 60-65%; b) TEE 5/'12: Nl LV Fxn, EF 60-65%, mild, holosystolic prolapse of the medial anterior leaflet. Mod MR . c) Echo 3/'15: Moderate, holosystolic prolapse of  medial. Mod MR d) TM Stress Echo (@ [facility]) Nl Lv Fxn, Mild-Mod MR @ Res0 --> Mod MR at HR 181 bpm (7:22 min, 10.10 METS) w/p WMA  . Oral ulcer   . Palpitations 10/2012   holter monitor, Dr. Ellyn Hack; no arrhythmia, no current problems   Current Outpatient Medications on File Prior to Visit  Medication Sig Dispense Refill  . albuterol (PROVENTIL HFA;VENTOLIN HFA) 108 (90 Base) MCG/ACT inhaler Inhale 2 puffs into the lungs every 6 (six) hours as needed for wheezing or shortness of breath. 1 Inhaler 2  . benzonatate (TESSALON PERLES) 100 MG capsule Take 1-2 capsules (100-200 mg total) by mouth every 8 (eight) hours as needed for cough. 30 capsule 0  . sertraline (ZOLOFT) 100 MG tablet Take 1 tablet (100 mg total) by mouth daily. (Patient taking differently: Take 100 mg by mouth every evening. ) 90 tablet 3  . SYNTHROID 88 MCG tablet Take 1 tablet (88 mcg total) by mouth daily. 90 tablet 3  . thalidomide (THALOMID) 50 MG capsule Take 50 mg by mouth daily.    . Vitamin D, Ergocalciferol, (DRISDOL) 1.25 MG (50000 UT) CAPS capsule TAKE 1 CAPSULE (50,000 UNITS TOTAL) BY MOUTH EVERY 7 DAYS 12 capsule 0  . oxyCODONE-acetaminophen (PERCOCET) 5-325 MG tablet Take 1-2 tablets by mouth every 6 (six) hours as needed for severe pain. 10 tablet 0   No current facility-administered medications on file prior to visit.    ROS as in subjective   Objective: BP 130/80   Pulse 84   Temp 98.1 F (36.7 C) (Oral)   Resp  16   Ht 5\' 6"  (1.676 m)   Wt 184 lb 3.2 oz (83.6 kg)   LMP 06/03/2018 (Exact Date)   SpO2 98%   BMI 29.73 kg/m   General appearance: Alert, WD/WN, no distress, ill appearing                             Skin: warm, no rash, no diaphoresis                           Head: mild sinus tenderness                            Eyes: conjunctiva normal, corneas clear, PERRLA                            Ears: flat TMs, external ear canals normal                          Nose: septum midline, turbinates swollen, with erythema and mucoid discharge             Mouth/throat: MMM, tongue normal, mild pharyngeal erythema                           Neck: supple, no adenopathy,  no thyromegaly, non tender                          Heart: RRR, normal S1, S2, no murmurs                         Lungs: +bronchial breath sounds, +scattered rhonchi, no wheezes, no rales                Extremities: no edema, non tender        Assessment: Encounter Diagnosis  Name Primary?  . Sinobronchitis Yes     Plan: Discussed recommendations and plan below  Patient Instructions  Recommendations:  Rest  Hydrate well with water  You can use the Promethazine DM for cough to see if this helps better than the Tessalon Perles  Begin Ceftin antibiotic twice daily for 10 days  Continue Albuterol inhaler 2 puffs every 6 hours as needed for wheezing ,shortness of breath and cough spells  If not improving in the next 3-5  days, call back.  Lisa Waters was seen today for cold.  Diagnoses and all orders for this visit:  Sinobronchitis  Other orders -     cefUROXime (CEFTIN) 500 MG tablet; Take 1 tablet (500 mg total) by mouth 2 (two) times daily with a meal. -     promethazine-dextromethorphan (PROMETHAZINE-DM) 6.25-15 MG/5ML syrup; Take 5 mLs by mouth 4 (four) times daily as needed for cough.

## 2018-06-10 NOTE — Patient Instructions (Signed)
Recommendations:  Rest  Hydrate well with water  You can use the Promethazine DM for cough to see if this helps better than the Tessalon Perles  Begin Ceftin antibiotic twice daily for 10 days  Continue Albuterol inhaler 2 puffs every 6 hours as needed for wheezing ,shortness of breath and cough spells  If not improving in the next 3-5  days, call back.

## 2018-07-02 ENCOUNTER — Telehealth: Payer: BLUE CROSS/BLUE SHIELD | Admitting: Physician Assistant

## 2018-07-02 DIAGNOSIS — K920 Hematemesis: Secondary | ICD-10-CM

## 2018-07-02 NOTE — Progress Notes (Signed)
Based on what you shared with me it looks like you have a serious condition that should be evaluated in a face to face office visit.  NOTE: If you entered your credit card information for this eVisit, you will not be charged. You may see a "hold" on your card for the $30 but that hold will drop off and you will not have a charge processed.  If you are having a true medical emergency please call 911.  If you need an urgent face to face visit, [facility] [place] has four urgent care centers for your convenience.  If you need care fast and have a high deductible or no insurance consider:   [email or web address] to reserve your spot online an avoid wait times  [facility] [address], [address] [place], [place] [address] 8 am to 8 pm Monday-Friday 10 am to 4 pm Saturday-Sunday *Across the street from [employer]  [facility]  [address] [place], [address] 8 am to 5 pm Monday-Friday * In the [facility]   The following sites will take your  insurance:  . [facility]  [phone] Get Driving Directions Find a Provider at this Location  [address] [place], [place] [address] . 10 am to 8 pm Monday-Friday . 12 pm to 8 pm Saturday-Sunday   . [facility]  [phone] Get Driving Directions Find a Provider at this Location  [address], [address] [place], [place] [address] . 8 am to 8 pm Monday-Friday . 9 am to 6 pm Saturday . 11 am to 6 pm Sunday   . [facility] [place]  [phone] Get Driving Directions  [address] [place], [place] [address] . 8 am to 8 pm Monday-Friday . 8 am to 4 pm Saturday-Sunday   Your e-visit answers were reviewed by a board certified advanced clinical practitioner to complete your personal care plan.  Thank you for using e-Visits.  

## 2018-07-08 DIAGNOSIS — N9489 Other specified conditions associated with female genital organs and menstrual cycle: Secondary | ICD-10-CM | POA: Diagnosis not present

## 2018-07-08 DIAGNOSIS — L403 Pustulosis palmaris et plantaris: Secondary | ICD-10-CM | POA: Diagnosis not present

## 2018-07-08 DIAGNOSIS — K120 Recurrent oral aphthae: Secondary | ICD-10-CM | POA: Diagnosis not present

## 2018-07-08 DIAGNOSIS — Z79899 Other long term (current) drug therapy: Secondary | ICD-10-CM | POA: Diagnosis not present

## 2018-07-24 DIAGNOSIS — N6489 Other specified disorders of breast: Secondary | ICD-10-CM | POA: Diagnosis not present

## 2018-07-24 DIAGNOSIS — R928 Other abnormal and inconclusive findings on diagnostic imaging of breast: Secondary | ICD-10-CM | POA: Diagnosis not present

## 2018-07-24 DIAGNOSIS — Z9189 Other specified personal risk factors, not elsewhere classified: Secondary | ICD-10-CM | POA: Diagnosis not present

## 2018-07-24 DIAGNOSIS — Z1231 Encounter for screening mammogram for malignant neoplasm of breast: Secondary | ICD-10-CM | POA: Diagnosis not present

## 2018-07-24 LAB — HM MAMMOGRAPHY

## 2018-08-04 ENCOUNTER — Other Ambulatory Visit: Payer: Self-pay | Admitting: Medical

## 2018-08-05 NOTE — Telephone Encounter (Signed)
Is this ok to refill?  

## 2018-08-09 ENCOUNTER — Other Ambulatory Visit: Payer: Self-pay | Admitting: Medical

## 2018-08-09 NOTE — Telephone Encounter (Signed)
Is this ok to refill?  

## 2018-08-20 DIAGNOSIS — E039 Hypothyroidism, unspecified: Secondary | ICD-10-CM | POA: Diagnosis not present

## 2018-08-20 DIAGNOSIS — C8190 Hodgkin lymphoma, unspecified, unspecified site: Secondary | ICD-10-CM | POA: Diagnosis not present

## 2018-08-20 DIAGNOSIS — Z08 Encounter for follow-up examination after completed treatment for malignant neoplasm: Secondary | ICD-10-CM | POA: Diagnosis not present

## 2018-08-20 DIAGNOSIS — Z8571 Personal history of Hodgkin lymphoma: Secondary | ICD-10-CM | POA: Diagnosis not present

## 2018-08-28 DIAGNOSIS — Z9189 Other specified personal risk factors, not elsewhere classified: Secondary | ICD-10-CM | POA: Diagnosis not present

## 2018-09-05 DIAGNOSIS — M25511 Pain in right shoulder: Secondary | ICD-10-CM | POA: Diagnosis not present

## 2018-09-25 DIAGNOSIS — Z9189 Other specified personal risk factors, not elsewhere classified: Secondary | ICD-10-CM | POA: Diagnosis not present

## 2018-09-28 ENCOUNTER — Other Ambulatory Visit: Payer: Self-pay | Admitting: Medical

## 2018-09-30 NOTE — Telephone Encounter (Signed)
Is this ok to refill?  

## 2018-11-05 ENCOUNTER — Other Ambulatory Visit: Payer: Self-pay | Admitting: Medical

## 2018-11-05 NOTE — Telephone Encounter (Signed)
Is this ok to refill?  

## 2018-11-06 ENCOUNTER — Other Ambulatory Visit: Payer: Self-pay | Admitting: Medical

## 2018-11-06 NOTE — Telephone Encounter (Signed)
Is this ok to refill?  

## 2018-11-07 NOTE — Telephone Encounter (Signed)
Set up med check, particularly thyroid.  Last thyroid lab 07/2017

## 2018-11-12 ENCOUNTER — Encounter: Payer: Self-pay | Admitting: Medical

## 2018-11-12 ENCOUNTER — Other Ambulatory Visit: Payer: Self-pay

## 2018-11-12 ENCOUNTER — Ambulatory Visit: Payer: BLUE CROSS/BLUE SHIELD | Admitting: Medical

## 2018-11-12 VITALS — Ht 66.0 in | Wt 175.0 lb

## 2018-11-12 DIAGNOSIS — J4520 Mild intermittent asthma, uncomplicated: Secondary | ICD-10-CM | POA: Diagnosis not present

## 2018-11-12 DIAGNOSIS — C8190 Hodgkin lymphoma, unspecified, unspecified site: Secondary | ICD-10-CM | POA: Diagnosis not present

## 2018-11-12 DIAGNOSIS — E559 Vitamin D deficiency, unspecified: Secondary | ICD-10-CM

## 2018-11-12 DIAGNOSIS — E038 Other specified hypothyroidism: Secondary | ICD-10-CM | POA: Diagnosis not present

## 2018-11-12 DIAGNOSIS — K121 Other forms of stomatitis: Secondary | ICD-10-CM

## 2018-11-12 DIAGNOSIS — Z79899 Other long term (current) drug therapy: Secondary | ICD-10-CM

## 2018-11-12 DIAGNOSIS — F325 Major depressive disorder, single episode, in full remission: Secondary | ICD-10-CM | POA: Insufficient documentation

## 2018-11-12 MED ORDER — SYNTHROID 88 MCG PO TABS: 88.0000 ug | ORAL_TABLET | Freq: Every day | ORAL | 0 refills | Status: DC

## 2018-11-12 NOTE — Patient Instructions (Signed)
Synthroid name brand thyroid medication is what we prefer due to inconsistencies with generic levothyroxine regarding symptoms and effectiveness of the medication.   One cheaper option for getting name brand Synthroid is a Development worker, community order pharmacy called [facility] in [place]. The savings are substantial.     I recommend calling [facility] to find out what your copay would be with them.  [facility]throid Direct program through [facility] in [place], [place]  Phone [phone]  Fax: [phone] Or mail prescription to    [facility]  [place], [place]   In the past, [facility] has at times been cheaper for Synthroid.  You can also price compare with [facility] and [facility] in [place]  [phone]

## 2018-11-12 NOTE — Progress Notes (Signed)
Subjective:     Patient ID: Lisa Waters, female   DOB: 21-Jun-1981, 38 y.o.   MRN: [id number]  This visit type was conducted due to national recommendations for restrictions regarding the COVID-19 Pandemic (e.g. social distancing) in an effort to limit this patient's exposure and mitigate transmission in our community.  Due to their co-morbid illnesses, this patient is at least at moderate risk for complications without adequate follow up.  This format is felt to be most appropriate for this patient at this time.    Documentation for virtual audio and video telecommunications through Zoom encounter:  The patient was located at home. The provider was located in the office. The patient did consent to this visit and is aware of possible charges through their insurance for this visit.  The other persons participating in this telemedicine service were none. Time spent on call was 18 minutes and in review of previous records >20 minutes total.  This virtual service is not related to other E/M service within previous 7 days.   HPI Chief Complaint  Patient presents with  . med check    med check    Med team: Dentist Eye doctor Dr. Glenetta Waters, cardiology Dr. Natividad Waters, general surgery regarding breast biopsy Dr. Huel Waters, [facility]ology at [facility] ([facility]) Dr. Juanita Waters, GI Dr. Saundra Waters, dermatology at [facility] Dr. Kerin Waters, gynecology and fertility , Lisa Eng, PA-C here for primary care  Virtual visit today for med check  Depression, anxiety - taking zoloft 100mg .  Doing well with this, no depressed mood at the moment, feeling happy most the time.  Work is going okay.  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  Asthma -no recent problems.  Has albuterol for as needed use  Hypothyroidism -compliant with Synthroid 27mcg name brand without complaint.  The co-pay is over $100  though  Oral ulcers -sees specialist for this, still taking tholamid but had a problem get this medicine this last refill.  Is doing home pregnancy tests and sending a picture of the negative result to her specialist.   Vitamin D deficiency-taking Vit D 50000 weekly, eats fish regularly   Past Medical History:  Diagnosis Date  . Anxiety   . Asthma    mild intermittent as of 07/2017 - just when patient gets sick  . Depression   . Female infertility   . GERD (gastroesophageal reflux disease)    Hx - no current problems, no meds  . H/O amaurosis fugax Fall 2012   Resolved, no current problems, Hx-MRI of brain/brain stem: no acute abnormality, small area of encephalomalacia left superior cerebellum that could be prior trauma, infection, or lacunar infarct  . History of Hodgkin's lymphoma 2007   in remission, sees [facility], prior with Dr. Elwanda Waters; prior chemotherapy and radiation therapy; sees yearly in December, no current problems  . Hyperlipidemia 07/2017   diet control, no meds  . Hypothyroidism   . Moderate mitral regurgitation by prior echocardiogram 09/2010   10/2015: EF 55-60%. Normal WM. Normal D Fxn. Bilateral MVP w/ Mod MR (very eccentric). Normal LA, RV & RA. Normal PAP.  Marland Kitchen MVP (mitral valve prolapse) 09/2010   Dr. Ellyn Waters, [facility]; a) Echo 3/'12: Mod MR; EF 60-65%; b) TEE 5/'12: Nl LV Fxn, EF 60-65%, mild, holosystolic prolapse of the medial anterior leaflet. Mod MR . c) Echo 3/'15: Moderate, holosystolic prolapse of  medial. Mod MR d) TM  Stress Echo (@ [facility]) Nl Lv Fxn, Mild-Mod MR @ Res0 --> Mod MR at HR 181 bpm (7:22 min, 10.10 METS) w/p WMA  . Oral ulcer   . Palpitations 10/2012   holter monitor, Dr. Ellyn Waters; no arrhythmia, no current problems   Current Outpatient Medications on File Prior to Visit  Medication Sig Dispense Refill  . albuterol (PROVENTIL HFA;VENTOLIN HFA) 108 (90 Base) MCG/ACT inhaler Inhale 2 puffs into the lungs every 6 (six) hours as needed for wheezing or  shortness of breath. 1 Inhaler 2  . sertraline (ZOLOFT) 100 MG tablet TAKE 1 TABLET (100 MG TOTAL) BY MOUTH EVERY EVENING. 90 tablet 0  . thalidomide (THALOMID) 50 MG capsule Take 50 mg by mouth daily.    . Vitamin D, Ergocalciferol, (DRISDOL) 1.25 MG (50000 UT) CAPS capsule TAKE 1 CAPSULE (50,000 UNITS TOTAL) BY MOUTH EVERY 7 DAYS 12 capsule 0  . oxyCODONE-acetaminophen (PERCOCET) 5-325 MG tablet Take 1-2 tablets by mouth every 6 (six) hours as needed for severe pain. 10 tablet 0   No current facility-administered medications on file prior to visit.      Review of Systems As in subjective    Objective:   Physical Exam Due to coronavirus pandemic stay at home measures, patient visit was virtual and they were not examined in person.   Gen: wd,wn, nad Answers questions appropriately     Assessment:     Encounter Diagnoses  Name Primary?  . Other specified hypothyroidism Yes  . Oral ulcer   . Mild intermittent asthma without complication   . Hodgkin's disease in remission ([place])   . High risk medication use   . Vitamin D deficiency   . Depression, major, in remission ([place])        Plan:     We discussed her medications, chronic issues.  Overall seem to be doing well at the moment.  She is past due for routine labs on thyroid and vitamin D.  She will plan to come in either in May or June for complete physical and fasting labs.  We will need to check thyroid and vitamin D labs at that time.  Depression in remission-continue Zoloft  Continue routine follow-up with specialist regarding history of Hodgkin's disease, chronic oral ulcers on high risk medication, doing self pregnancy test at home monthly and corresponding with her specialist  Asthma- has an inhaler on file, no recent problems  Nakkia was seen today for med check.  Diagnoses and all orders for this visit:  Other specified hypothyroidism  Oral ulcer  Mild intermittent asthma without complication  Hodgkin's  disease in remission ([place])  High risk medication use  Vitamin D deficiency  Depression, major, in remission ([place])  Other orders -     SYNTHROID 88 MCG tablet; Take 1 tablet (88 mcg total) by mouth daily.

## 2018-11-12 NOTE — Progress Notes (Signed)
done

## 2018-11-27 ENCOUNTER — Encounter: Payer: Self-pay | Admitting: Medical

## 2019-01-29 ENCOUNTER — Other Ambulatory Visit: Payer: Self-pay | Admitting: Medical

## 2019-01-29 NOTE — Telephone Encounter (Signed)
[facility] is requesting to fill pt zoloft. Please advise K[facility] 

## 2019-02-04 DIAGNOSIS — N9489 Other specified conditions associated with female genital organs and menstrual cycle: Secondary | ICD-10-CM | POA: Diagnosis not present

## 2019-02-04 DIAGNOSIS — Z5181 Encounter for therapeutic drug level monitoring: Secondary | ICD-10-CM | POA: Diagnosis not present

## 2019-02-04 DIAGNOSIS — K120 Recurrent oral aphthae: Secondary | ICD-10-CM | POA: Diagnosis not present

## 2019-02-04 DIAGNOSIS — Z79899 Other long term (current) drug therapy: Secondary | ICD-10-CM | POA: Diagnosis not present

## 2019-02-11 ENCOUNTER — Encounter: Payer: Self-pay | Admitting: Family Medicine

## 2019-02-11 ENCOUNTER — Other Ambulatory Visit: Payer: Self-pay

## 2019-02-11 ENCOUNTER — Ambulatory Visit: Payer: BLUE CROSS/BLUE SHIELD | Admitting: Family Medicine

## 2019-02-11 VITALS — Ht 67.0 in | Wt 184.0 lb

## 2019-02-11 DIAGNOSIS — J069 Acute upper respiratory infection, unspecified: Secondary | ICD-10-CM

## 2019-02-11 NOTE — Progress Notes (Signed)
   Subjective:    Patient ID: Lisa Waters, female    DOB: 10/04/1980, 38 y.o.   MRN: [id number]  HPI Documentation for virtual telephone encounter. Documentation for virtual audio and video telecommunications through [place] encounter: The patient was located at home. The provider was located in the office. The patient did consent to this visit and is aware of possible charges through their insurance for this visit. The other persons participating in this telemedicine service were none. Time spent on call was 5 minutes and in review of previous records >10 minutes total. This virtual service is not related to other E/M service within previous 7 days She has a 1 day history of cough, sore throat, rhinorrhea, slight fatigue and slight shortness of breath.  She states the symptoms usually occur when she gets a cold.  No fever.  No exposure to anybody that she knows of but has had COVID.  Review of Systems     Objective:   Physical Exam Alert and in no distress and appears quite normal.       Assessment & Plan:   Encounter Diagnosis  Name Primary?  . Viral upper respiratory tract infection Yes  I explained that this is probably a viral URI since it is very similar symptoms that she has had in the past.  Recommend conservative care for this however if the cough, congestion gets worse or she develops a fever she is to call and I will order a [place] test.  She was comfortable with that

## 2019-02-19 ENCOUNTER — Telehealth: Payer: Self-pay | Admitting: Cardiology

## 2019-02-19 ENCOUNTER — Other Ambulatory Visit: Payer: Self-pay

## 2019-02-19 ENCOUNTER — Ambulatory Visit: Payer: BC Managed Care – PPO | Admitting: Medical

## 2019-02-19 ENCOUNTER — Encounter: Payer: Self-pay | Admitting: Medical

## 2019-02-19 VITALS — BP 158/98 | Wt 185.0 lb

## 2019-02-19 DIAGNOSIS — Z20828 Contact with and (suspected) exposure to other viral communicable diseases: Secondary | ICD-10-CM | POA: Diagnosis not present

## 2019-02-19 DIAGNOSIS — Z5329 Procedure and treatment not carried out because of patient's decision for other reasons: Secondary | ICD-10-CM

## 2019-02-19 DIAGNOSIS — I10 Essential (primary) hypertension: Secondary | ICD-10-CM | POA: Diagnosis not present

## 2019-02-19 DIAGNOSIS — Z91199 Patient's noncompliance with other medical treatment and regimen due to unspecified reason: Secondary | ICD-10-CM

## 2019-02-19 DIAGNOSIS — J069 Acute upper respiratory infection, unspecified: Secondary | ICD-10-CM | POA: Diagnosis not present

## 2019-02-19 NOTE — Telephone Encounter (Signed)
Returned call to pt she states that her BP has been running high today it is 158/98 HR 86 she has not been seen since 2018. She has appt today with her PCP she will also discuss her BP issue with him appt made with [facility] 02-26-2019.

## 2019-02-19 NOTE — Telephone Encounter (Signed)
  Pt c/o BP issue: STAT if pt c/o blurred vision, one-sided weakness or slurred speech  1. What are your last 5 BP readings? 181/101  2. Are you having any other symptoms (ex. Dizziness, headache, blurred vision, passed out)? headache  3. What is your BP issue? Patient went to dentist on 02/17/19 and her blood pressure was running high, also c/o headache. She feels like it has been running high for a while. She has also felt bad and very fatigued. Patient made appt for earliest which was 04/23/19.

## 2019-02-19 NOTE — Progress Notes (Signed)
Note: CMA called patient to do triage questionnaire prior to appointment for virtual consult.  It took several phone calls to get her on the phone for triage.  When it came time for the appointment I called numerous times.  I ended up leaving a message as I could not get in touch with her by phone.  I tried several times later in the day but still no answer

## 2019-02-24 DIAGNOSIS — C8190 Hodgkin lymphoma, unspecified, unspecified site: Secondary | ICD-10-CM | POA: Diagnosis not present

## 2019-02-24 DIAGNOSIS — Z923 Personal history of irradiation: Secondary | ICD-10-CM | POA: Diagnosis not present

## 2019-02-24 DIAGNOSIS — Z9189 Other specified personal risk factors, not elsewhere classified: Secondary | ICD-10-CM | POA: Diagnosis not present

## 2019-02-25 NOTE — Progress Notes (Signed)
error 

## 2019-02-26 ENCOUNTER — Encounter: Payer: Self-pay | Admitting: Cardiology

## 2019-02-26 ENCOUNTER — Other Ambulatory Visit: Payer: Self-pay

## 2019-02-26 ENCOUNTER — Ambulatory Visit: Payer: BC Managed Care – PPO | Admitting: Cardiology

## 2019-02-26 ENCOUNTER — Telehealth: Payer: Self-pay | Admitting: *Deleted

## 2019-02-26 VITALS — BP 137/81 | HR 89 | Ht 67.0 in | Wt 188.6 lb

## 2019-02-26 DIAGNOSIS — E038 Other specified hypothyroidism: Secondary | ICD-10-CM

## 2019-02-26 DIAGNOSIS — C8190 Hodgkin lymphoma, unspecified, unspecified site: Secondary | ICD-10-CM | POA: Diagnosis not present

## 2019-02-26 DIAGNOSIS — Z923 Personal history of irradiation: Secondary | ICD-10-CM | POA: Insufficient documentation

## 2019-02-26 DIAGNOSIS — Z9189 Other specified personal risk factors, not elsewhere classified: Secondary | ICD-10-CM | POA: Insufficient documentation

## 2019-02-26 DIAGNOSIS — R000 Tachycardia, unspecified: Secondary | ICD-10-CM | POA: Insufficient documentation

## 2019-02-26 DIAGNOSIS — I340 Nonrheumatic mitral (valve) insufficiency: Secondary | ICD-10-CM

## 2019-02-26 DIAGNOSIS — J45909 Unspecified asthma, uncomplicated: Secondary | ICD-10-CM | POA: Insufficient documentation

## 2019-02-26 DIAGNOSIS — Z79899 Other long term (current) drug therapy: Secondary | ICD-10-CM

## 2019-02-26 LAB — BASIC METABOLIC PANEL
BUN/Creatinine Ratio: 15 (ref 9–23)
BUN: 9 mg/dL (ref 6–20)
CO2: 22 mmol/L (ref 20–29)
Calcium: 9.8 mg/dL (ref 8.7–10.2)
Chloride: 92 mmol/L — ABNORMAL LOW (ref 96–106)
Creatinine, Ser: 0.62 mg/dL (ref 0.57–1.00)
GFR calc Af Amer: 131 mL/min/{1.73_m2} (ref >59)
GFR calc non Af Amer: 114 mL/min/{1.73_m2} (ref >59)
Glucose: 83 mg/dL (ref 65–99)
Potassium: 4.4 mmol/L (ref 3.5–5.2)
Sodium: 135 mmol/L (ref 134–144)

## 2019-02-26 LAB — CBC
Hematocrit: 38.8 % (ref 34.0–46.6)
Hemoglobin: 13.4 g/dL (ref 11.1–15.9)
MCH: 31.5 pg (ref 26.6–33.0)
MCHC: 34.5 g/dL (ref 31.5–35.7)
MCV: 91 fL (ref 79–97)
Platelets: 255 10*3/uL (ref 150–450)
RBC: 4.25 x10E6/uL (ref 3.77–5.28)
RDW: 11.9 % (ref 11.7–15.4)
WBC: 4.4 10*3/uL (ref 3.4–10.8)

## 2019-02-26 LAB — TSH: TSH: 7.42 u[IU]/mL — ABNORMAL HIGH (ref 0.450–4.500)

## 2019-02-26 MED ORDER — METOPROLOL TARTRATE 25 MG PO TABS: 12.5000 mg | ORAL_TABLET | Freq: Two times a day (BID) | ORAL | 6 refills | Status: DC

## 2019-02-26 NOTE — Progress Notes (Signed)
Cardiology Office Note:    Date:  02/26/2019   ID:  Lisa Waters, DOB 21-May-1981, MRN [id number]  PCP:  Carlena Hurl, PA-C  Cardiologist:  [name] Electrophysiologist:  None   Referring MD: Carlena Hurl, PA-C   CC: Tachycardia, DOE  History of Present Illness:    Lisa Waters is a 38 y.o. female with a hx of mitral valve prolapse with moderate MR.  Her most recent echo in April 2019 showed no change from her prior echoes.  She is seen in the office today with complaints of dyspnea on exertion and tachycardia for a week.  The patient says at times her heart rate at home is up to 120.  She had been doing some exercise but up until a week ago she had to stop because of shortness of breath with exercise and tachycardia.  When she initially came into the office her heart rate was 89, when I examined her her heart rate was up to 110 and regular.  Her EKG shows normal sinus rhythm.  She also tells me that she has lost about 10 pounds in the last month, she is "trying to watch what I eat".  She denies any over-the-counter medications or excessive caffeine use. She denies any new unusual stressors.   Past Medical History:  Diagnosis Date  . Anxiety   . Asthma    mild intermittent as of 07/2017 - just when patient gets sick  . Depression   . Female infertility   . GERD (gastroesophageal reflux disease)    Hx - no current problems, no meds  . H/O amaurosis fugax Fall 2012   Resolved, no current problems, Hx-MRI of brain/brain stem: no acute abnormality, small area of encephalomalacia left superior cerebellum that could be prior trauma, infection, or lacunar infarct  . History of Hodgkin's lymphoma 2007   in remission, sees [facility], prior with Dr. Elwanda Brooklyn; prior chemotherapy and radiation therapy; sees yearly in December, no current problems  . Hyperlipidemia 07/2017   diet control, no meds  . Hypothyroidism   . Moderate mitral regurgitation by prior echocardiogram 09/2010   10/2015: EF 55-60%. Normal WM. Normal D Fxn. Bilateral MVP w/ Mod MR (very eccentric). Normal LA, RV & RA. Normal PAP.  Marland Kitchen MVP (mitral valve prolapse) 09/2010   Dr. Ellyn Hack, [facility]; a) Echo 3/'12: Mod MR; EF 60-65%; b) TEE 5/'12: Nl LV Fxn, EF 60-65%, mild, holosystolic prolapse of the medial anterior leaflet. Mod MR . c) Echo 3/'15: Moderate, holosystolic prolapse of  medial. Mod MR d) TM Stress Echo (@ [facility]) Nl Lv Fxn, Mild-Mod MR @ Res0 --> Mod MR at HR 181 bpm (7:22 min, 10.10 METS) w/p WMA  . Oral ulcer   . Palpitations 10/2012   holter monitor, Dr. Ellyn Hack; no arrhythmia, no current problems    Past Surgical History:  Procedure Laterality Date  . APPENDECTOMY  1993  . BREAST ENHANCEMENT SURGERY  2002  . COLONOSCOPY  07/2014   Dr. Collene Mares - normal  . DILATATION & CURETTAGE/HYSTEROSCOPY WITH MYOSURE N/A 01/29/2018   Procedure: DILATATION & CURETTAGE/HYSTEROSCOPY WITH MYOSURE POLYPECTOMY;  Surgeon: Charyl Bigger, MD;  Location: [facility];  Service: Gynecology;  Laterality: N/A;  . ESOPHAGOGASTRODUODENOSCOPY  04/11/13   [facility] Dr. Collene Mares  . FOOT SURGERY Bilateral    on toes  . INSERTION CENTRAL VENOUS ACCESS DEVICE W/ SUBCUTANEOUS PORT  2006  . LYMPH NODE BIOPSY  2006   under right arm  . NM MYOCAR PERF  WALL MOTION  12/2010   persantine myoview - moderate breast attenuation (fixed mid-distal anterior defect), EF 68%, low risk scan  . RHINOPLASTY  2007   deviated septum  . TEE WITHOUT CARDIOVERSION  11/2010   Nl LV Fxn, EF 60-65%, mild, holosystolic prolapse of the medial anterior leaflet. Mod MR .   Marland Kitchen TRANSTHORACIC ECHOCARDIOGRAM  3/'14; 3/'15   LVEF 60-65%, wall motion normal, LV function normal, moderate holosystolic MVP (medial the middle scallop of the posterior leaflet), moderate regurgitation (directed posteriorly), no shunt -- stable over 2 years  . TRANSTHORACIC ECHOCARDIOGRAM  10/2015   EF 55-60%. Normal WM. Normal D Fxn. Bilateral MVP w/ Mod MR (very eccentric).  Normal LA, RV & RA. Normal PAP.  . WISDOM TOOTH EXTRACTION      Current Medications: Current Meds  Medication Sig  . albuterol (PROVENTIL HFA;VENTOLIN HFA) 108 (90 Base) MCG/ACT inhaler Inhale 2 puffs into the lungs every 6 (six) hours as needed for wheezing or shortness of breath.  . sertraline (ZOLOFT) 100 MG tablet TAKE 1 TABLET (100 MG TOTAL) BY MOUTH EVERY EVENING.  . SYNTHROID 88 MCG tablet Take 1 tablet (88 mcg total) by mouth daily.  . thalidomide (THALOMID) 50 MG capsule Take 50 mg by mouth daily.     Allergies:   Codeine, Fish allergy, Iohexol, Pindolol, and Iodinated diagnostic agents   Social History   Socioeconomic History  . Marital status: Married    Spouse name: Not on file  . Number of children: 0  . Years of education: Not on file  . Highest education level: Not on file  Occupational History  . Occupation: works in Engineer, production: [place]  . Financial resource strain: Not on file  . Food insecurity    Worry: Not on file    Inability: Not on file  . Transportation needs    Medical: Not on file    Non-medical: Not on file  Tobacco Use  . Smoking status: Former Smoker    Packs/day: 1.00    Years: 10.00    Pack years: 10.00    Types: Cigarettes    Quit date: 11/07/2009    Years since quitting: 9.3  . Smokeless tobacco: Never Used  Substance and Sexual Activity  . Alcohol use: Yes    Alcohol/week: 1.0 - 2.0 standard drinks    Types: 1 - 2 Cans of beer per week    Comment: social Beer/Liquor  . Drug use: No  . Sexual activity: Yes    Partners: Male    Birth control/protection: None  Lifestyle  . Physical activity    Days per week: Not on file    Minutes per session: Not on file  . Stress: Not on file  Relationships  . Social Herbalist on phone: Not on file    Gets together: Not on file    Attends religious service: Not on file    Active member of club or organization: Not on file    Attends meetings of clubs  or organizations: Not on file    Relationship status: Not on file  Other Topics Concern  . Not on file  Social History Narrative   Married, 0 children, has failed fertility treatment & has given up attempts at IVF as well as adoption. 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 There is no history of Heart disease, Hypertension, or Hyperlipidemia.  ROS:   Please see the history of present illness.     All other systems reviewed and are negative.  EKGs/Labs/Other Studies Reviewed:    The following studies were reviewed today: Echo April 2019  EKG:  EKG is  ordered today.  The ekg ordered today demonstrates NSR 89  Recent Labs: No results found for requested labs within last 8760 hours.  Recent Lipid Panel    Component Value Date/Time   CHOL 285 (H) 07/26/2017 1128   TRIG 59 07/26/2017 1128   HDL 116 07/26/2017 1128   CHOLHDL 2.5 07/26/2017 1128   VLDL 11 04/01/2015 0001   LDLCALC 154 (H) 07/26/2017 1128    Physical Exam:    VS:  BP 137/81   Pulse 89   Ht 5\' 7"  (1.702 m)   Wt 188 lb 9.6 oz (85.5 kg)   LMP 02/10/2019 (Exact Date)   BMI 29.54 kg/m     Wt Readings from Last 3 Encounters:  02/26/19 188 lb 9.6 oz (85.5 kg)  02/19/19 185 lb (83.9 kg)  02/11/19 184 lb (83.5 kg)     GEN:  Well nourished, well developed Caucasian female in no acute distress HEENT: Normal NECK: No JVD; No carotid bruits LYMPHATICS: No lymphadenopathy CARDIAC: RRR, increased rate-110-no murmurs, rubs, gallops RESPIRATORY:  Clear to  auscultation without rales, wheezing or rhonchi  ABDOMEN: Soft, non-tender, non-distended MUSCULOSKELETAL:  No edema; No deformity  SKIN: Warm and dry NEUROLOGIC:  Alert and oriented x 3 PSYCHIATRIC:  Normal affect   ASSESSMENT:    Tachycardia This appears to be recent onset and associated with DOE and fatigue. She has had a 10 lb weight los.  Check TSH, free T4, BMP, CBC. I added low dose metoprolol as well.   Other specified hypothyroidism On Synthroid  Moderate mitral regurgitation by prior echocardiogram Moderate MR with MVP- no change on echo April 2019  Hodgkin's disease in remission [facility]) Chemo and radiation in 2007- followed at [facility]:    Check TSH, free T4, CBC, BMP, and 7 day ZIO.  I added Metoprolol 12.5 mg BID.  F/U in two weeks.   Medication Adjustments/Labs and Tests Ordered: Current medicines are reviewed at length with the patient today.  Concerns regarding medicines are outlined above.  Orders Placed This Encounter  Procedures  . Basic Metabolic Panel (BMET)  . CBC  . TSH  . LONG TERM MONITOR (3-14 DAYS)  . EKG 12-Lead   Meds ordered this encounter  Medications  . metoprolol tartrate (LOPRESSOR) 25 MG tablet    Sig: Take 0.5 tablets (12.5 mg total) by mouth 2 (two) times daily.    Dispense:  30 tablet    Refill:  6    Patient Instructions  Medication Instructions:  START- Metoprolol 12.5 mg by mouth twice a day  If you need a refill on your cardiac medications before your next appointment, please call your pharmacy.  Labwork: CBC, BMP and TSH HERE IN OUR OFFICE AT [facility]  You will NOT need to fast   Take the provided lab slips with you to the lab for  your blood draw.   When you have your labs (blood work) drawn today and your tests are completely normal, you will receive your results only by MyChart Message (if you have MyChart) -OR-  A paper copy in the mail.  If you have any lab test that is abnormal or we need to change your treatment, we  will call you to review these results.  Testing/Procedures: Your physician has recommended that you wear a 7 days Zio Patch monitor. Holter monitors are medical devices that record the heart's electrical activity. Doctors most often use these monitors to diagnose arrhythmias. Arrhythmias are problems with the speed or rhythm of the heartbeat. The monitor is a small, portable device. You can wear one while you do your normal daily activities. This is usually used to diagnose what is causing palpitations/syncope (passing out).   Follow-Up: . Your physician recommends that you schedule a follow-up appointment in: 2 Weeks with Jory Sims   At [facility], you and your health needs are our priority.  As part of our continuing mission to provide you with exceptional heart care, we have created designated Provider Care Teams.  These Care Teams include your primary Cardiologist (physician) and Advanced Practice Providers (APPs -  Physician Assistants and Nurse Practitioners) who all work together to provide you with the care you need, when you need it.  Thank you for choosing [facility] at [facility] [employer], Kerin Ransom, [place]  02/26/2019 9:44 AM    [place]

## 2019-02-26 NOTE — Patient Instructions (Addendum)
Medication Instructions:  START- Metoprolol 12.5 mg by mouth twice a day  If you need a refill on your cardiac medications before your next appointment, please call your pharmacy.  Labwork: CBC, BMP and TSH HERE IN OUR OFFICE AT [facility]  You will NOT need to fast   Take the provided lab slips with you to the lab for your blood draw.   When you have your labs (blood work) drawn today and your tests are completely normal, you will receive your results only by MyChart Message (if you have MyChart) -OR-  A paper copy in the mail.  If you have any lab test that is abnormal or we need to change your treatment, we will call you to review these results.  Testing/Procedures: Your physician has recommended that you wear a 7 days Zio Patch monitor. Holter monitors are medical devices that record the heart's electrical activity. Doctors most often use these monitors to diagnose arrhythmias. Arrhythmias are problems with the speed or rhythm of the heartbeat. The monitor is a small, portable device. You can wear one while you do your normal daily activities. This is usually used to diagnose what is causing palpitations/syncope (passing out).   Follow-Up: . Your physician recommends that you schedule a follow-up appointment in: 2 Weeks with Jory Sims   At [facility], you and your health needs are our priority.  As part of our continuing mission to provide you with exceptional heart care, we have created designated Provider Care Teams.  These Care Teams include your primary Cardiologist (physician) and Advanced Practice Providers (APPs -  Physician Assistants and Nurse Practitioners) who all work together to provide you with the care you need, when you need it.  Thank you for choosing [facility] at [facility]!!

## 2019-02-26 NOTE — Assessment & Plan Note (Signed)
Moderate MR with MVP- no change on echo April 2019

## 2019-02-26 NOTE — Assessment & Plan Note (Signed)
On Synthroid

## 2019-02-26 NOTE — Assessment & Plan Note (Signed)
This appears to be recent onset and associated with DOE and fatigue. She has had a 10 lb weight los.  Check TSH, free T4, BMP, CBC. I added low dose metoprolol as well.

## 2019-02-26 NOTE — Telephone Encounter (Signed)
7 day ZIO XT long term holter monitor to be mailed to patients home.  Instructions reviewed briefly as they are included in the monitor kit.

## 2019-02-26 NOTE — Assessment & Plan Note (Signed)
Chemo and radiation in 2007- followed at [facility]

## 2019-03-01 ENCOUNTER — Ambulatory Visit (INDEPENDENT_AMBULATORY_CARE_PROVIDER_SITE_OTHER): Payer: BC Managed Care – PPO

## 2019-03-01 DIAGNOSIS — R0600 Dyspnea, unspecified: Secondary | ICD-10-CM

## 2019-03-01 DIAGNOSIS — R000 Tachycardia, unspecified: Secondary | ICD-10-CM | POA: Diagnosis not present

## 2019-03-01 DIAGNOSIS — R0609 Other forms of dyspnea: Secondary | ICD-10-CM

## 2019-03-03 ENCOUNTER — Ambulatory Visit: Payer: BC Managed Care – PPO | Admitting: Medical

## 2019-03-03 ENCOUNTER — Other Ambulatory Visit: Payer: Self-pay

## 2019-03-03 ENCOUNTER — Encounter: Payer: Self-pay | Admitting: Medical

## 2019-03-03 VITALS — BP 120/72 | HR 106 | Temp 98.6°F | Resp 16 | Ht 66.0 in | Wt 189.0 lb

## 2019-03-03 DIAGNOSIS — R002 Palpitations: Secondary | ICD-10-CM

## 2019-03-03 DIAGNOSIS — G479 Sleep disorder, unspecified: Secondary | ICD-10-CM | POA: Insufficient documentation

## 2019-03-03 DIAGNOSIS — E038 Other specified hypothyroidism: Secondary | ICD-10-CM | POA: Diagnosis not present

## 2019-03-03 NOTE — Progress Notes (Signed)
Subjective: Chief Complaint  Patient presents with  . heart rate    heart rate beating fast, went to cardiology and her thyroid was elevated    Here today to discuss palpitations, fast heart rate.  She sees cardiology and just saw them recently.  She was put on metoprolol which she just recently started.  She also had a thyroid lab done at cardiology that was abnormal, but blood count and basic metabolic lab was normal.   She is compliant with levothyroxine 88 mcg daily on empty stomach without other medications.  No change in the look of a tablet from the pharmacy for levothyroxine.  She denies any problems with breathing or asthma currently.  She notes that she does not always sleep well, does not awaken rested.  No daytime somnolence no witnessed apnea no snoring that she knows of.  However her husband is on CPAP machine and he sleeps really well , so she is not sure that he would know whether she has apnea or not.  She had a sleep study years ago that was reportedly normal  In the last 2 weeks her heart rate is ranging from 115-128 at rest.  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  She hired a another employee to help with the organization and keeping up with all his moving parts but this person is not up to speed yet  No other aggravating or relieving factors. No other complaint.  Past Medical History:  Diagnosis Date  . Anxiety   . Asthma    mild intermittent as of 07/2017 - just when patient gets sick  . Depression   . Female infertility   . GERD (gastroesophageal reflux disease)    Hx - no current problems, no meds  . H/O amaurosis fugax Fall 2012   Resolved, no current problems, Hx-MRI of brain/brain stem: no acute abnormality, small area of encephalomalacia left superior cerebellum that could be prior  trauma, infection, or lacunar infarct  . History of Hodgkin's lymphoma 2007   in remission, sees [facility], prior with Dr. Elwanda Brooklyn; prior chemotherapy and radiation therapy; sees yearly in December, no current problems  . Hyperlipidemia 07/2017   diet control, no meds  . Hypothyroidism   . Moderate mitral regurgitation by prior echocardiogram 09/2010   10/2015: EF 55-60%. Normal WM. Normal D Fxn. Bilateral MVP w/ Mod MR (very eccentric). Normal LA, RV & RA. Normal PAP.  Marland Kitchen MVP (mitral valve prolapse) 09/2010   Dr. Ellyn Hack, [facility]; a) Echo 3/'12: Mod MR; EF 60-65%; b) TEE 5/'12: Nl LV Fxn, EF 60-65%, mild, holosystolic prolapse of the medial anterior leaflet. Mod MR . c) Echo 3/'15: Moderate, holosystolic prolapse of  medial. Mod MR d) TM Stress Echo (@ [facility]) Nl Lv Fxn, Mild-Mod MR @ Res0 --> Mod MR at HR 181 bpm (7:22 min, 10.10 METS) w/p WMA  . Oral ulcer   . Palpitations 10/2012   holter monitor, Dr. Ellyn Hack; no arrhythmia, no current problems   Current Outpatient Medications on File Prior to Visit  Medication Sig Dispense Refill  . albuterol (PROVENTIL HFA;VENTOLIN HFA) 108 (90 Base) MCG/ACT inhaler Inhale 2 puffs into the lungs every 6 (six) hours as needed for wheezing or shortness of breath. 1 Inhaler 2  . metoprolol tartrate (LOPRESSOR) 25 MG tablet Take 0.5 tablets (12.5  mg total) by mouth 2 (two) times daily. 30 tablet 6  . sertraline (ZOLOFT) 100 MG tablet TAKE 1 TABLET (100 MG TOTAL) BY MOUTH EVERY EVENING. 90 tablet 0  . SYNTHROID 88 MCG tablet Take 1 tablet (88 mcg total) by mouth daily. 90 tablet 0  . thalidomide (THALOMID) 50 MG capsule Take 50 mg by mouth daily.     No current facility-administered medications on file prior to visit.    ROS as in subjective    Objective: BP 120/72   Pulse (!) 106   Temp 98.6 F (37 C) (Oral)   Resp 16   Ht 5\' 6"  (1.676 m)   Wt 189 lb (85.7 kg)   LMP 02/10/2019 (Exact Date)   SpO2 98%   BMI 30.51 kg/m    Wt Readings from Last 3  Encounters:  03/03/19 189 lb (85.7 kg)  02/26/19 188 lb 9.6 oz (85.5 kg)  02/19/19 185 lb (83.9 kg)    General appearance: alert, no distress, WD/WN,  Neck: supple, no lymphadenopathy, no thyromegaly, no masses Heart: RRR, normal S1, S2, no murmurs Lungs: CTA bilaterally, no wheezes, rhonchi, or rales Ext: no edema Pulses: 2+ symmetric, upper and lower extremities, normal cap refill No calve tenderness, abnormality or asymmetry     Assessment: Encounter Diagnoses  Name Primary?  . Other specified hypothyroidism Yes  . Palpitation   . Sleep disturbance      Plan: We discussed possible causes of palpitations.  She sees cardiology and turns into a Holter monitor in the next week.  We discussed the benefit of name brand Synthroid versus levothyroxine and the narrow therapeutic range for thyroid medication.  Her recent TSH was elevated but CBC and basic metabolic panel was normal.  Labs as below today.  We discussed importance of good sleep hygiene and consistent bedtime routine.  We discussed the possibility of sleep study.  We discussed stress and anxiety, and ways to deal with stress and organization.  She just started the beta-blocker recently so we have to give this more time to work  Camron was seen today for heart rate.  Diagnoses and all orders for this visit:  Other specified hypothyroidism -     T4, free -     T3  Palpitation  Sleep disturbance

## 2019-03-04 ENCOUNTER — Other Ambulatory Visit: Payer: Self-pay | Admitting: Medical

## 2019-03-04 LAB — T3: T3, Total: 80 ng/dL (ref 71–180)

## 2019-03-04 LAB — T4, FREE: Free T4: 1.24 ng/dL (ref 0.82–1.77)

## 2019-03-04 MED ORDER — SYNTHROID 88 MCG PO TABS: 88.0000 ug | ORAL_TABLET | Freq: Every day | ORAL | 3 refills | Status: DC

## 2019-03-04 NOTE — Telephone Encounter (Signed)
Is this ok to refill?  

## 2019-03-17 NOTE — Progress Notes (Signed)
Cardiology Office Note   Date:  03/19/2019   ID:  Lisa Waters, DOB June 09, 1981, MRN [id number]  PCP:  Carlena Hurl, PA-C  Cardiologist:   CC: Tachycardia    History of Present Illness: Lisa Waters is a 38 y.o. female who presents for ongoing assessment and management of mitral valve disease with mitral valve prolapse by history and moderate MR.  Other history includes chronic dyspnea on exertion and tachycardia.  When last seen in the office by Kerin Ransom, PA, the patient was scheduled for TSH, free T4, BMP, CBC, and low-dose metoprolol was added.  A 7-day ZIO monitor was planned. Labs were normal. Still waiting on Zio monitor.  She is here for follow up.   Zio monitor results are not available at this time. She continues to be tachycardiac and is now hypertensive today. She states she feels jittery. She is sleeping okay and not having issues with DOE, dizziness, or fatigue. She is compliant with metoprolol.   Past Medical History:  Diagnosis Date  . Anxiety   . Asthma    mild intermittent as of 07/2017 - just when patient gets sick  . Depression   . Female infertility   . GERD (gastroesophageal reflux disease)    Hx - no current problems, no meds  . H/O amaurosis fugax Fall 2012   Resolved, no current problems, Hx-MRI of brain/brain stem: no acute abnormality, small area of encephalomalacia left superior cerebellum that could be prior trauma, infection, or lacunar infarct  . History of Hodgkin's lymphoma 2007   in remission, sees [facility], prior with Dr. Elwanda Brooklyn; prior chemotherapy and radiation therapy; sees yearly in December, no current problems  . Hyperlipidemia 07/2017   diet control, no meds  . Hypothyroidism   . Moderate mitral regurgitation by prior echocardiogram 09/2010   10/2015: EF 55-60%. Normal WM. Normal D Fxn. Bilateral MVP w/ Mod MR (very eccentric). Normal LA, RV & RA. Normal PAP.  Marland Kitchen MVP (mitral valve prolapse) 09/2010   Dr. Ellyn Hack, [facility]; a) Echo 3/'12: Mod  MR; EF 60-65%; b) TEE 5/'12: Nl LV Fxn, EF 60-65%, mild, holosystolic prolapse of the medial anterior leaflet. Mod MR . c) Echo 3/'15: Moderate, holosystolic prolapse of  medial. Mod MR d) TM Stress Echo (@ [facility]) Nl Lv Fxn, Mild-Mod MR @ Res0 --> Mod MR at HR 181 bpm (7:22 min, 10.10 METS) w/p WMA  . Oral ulcer   . Palpitations 10/2012   holter monitor, Dr. Ellyn Hack; no arrhythmia, no current problems    Past Surgical History:  Procedure Laterality Date  . APPENDECTOMY  1993  . BREAST ENHANCEMENT SURGERY  2002  . COLONOSCOPY  07/2014   Dr. Collene Mares - normal  . DILATATION & CURETTAGE/HYSTEROSCOPY WITH MYOSURE N/A 01/29/2018   Procedure: DILATATION & CURETTAGE/HYSTEROSCOPY WITH MYOSURE POLYPECTOMY;  Surgeon: Charyl Bigger, MD;  Location: [facility];  Service: Gynecology;  Laterality: N/A;  . ESOPHAGOGASTRODUODENOSCOPY  04/11/13   [facility] Dr. Collene Mares  . FOOT SURGERY Bilateral    on toes  . INSERTION CENTRAL VENOUS ACCESS DEVICE W/ SUBCUTANEOUS PORT  2006  . LYMPH NODE BIOPSY  2006   under right arm  . NM MYOCAR PERF WALL MOTION  12/2010   persantine myoview - moderate breast attenuation (fixed mid-distal anterior defect), EF 68%, low risk scan  . RHINOPLASTY  2007   deviated septum  . TEE WITHOUT CARDIOVERSION  11/2010   Nl LV Fxn, EF 60-65%, mild, holosystolic prolapse of the medial anterior leaflet.  Mod MR .   Marland Kitchen TRANSTHORACIC ECHOCARDIOGRAM  3/'14; 3/'15   LVEF 60-65%, wall motion normal, LV function normal, moderate holosystolic MVP (medial the middle scallop of the posterior leaflet), moderate regurgitation (directed posteriorly), no shunt -- stable over 2 years  . TRANSTHORACIC ECHOCARDIOGRAM  10/2015   EF 55-60%. Normal WM. Normal D Fxn. Bilateral MVP w/ Mod MR (very eccentric). Normal LA, RV & RA. Normal PAP.  . WISDOM TOOTH EXTRACTION       Current Outpatient Medications  Medication Sig Dispense Refill  . albuterol (PROVENTIL HFA;VENTOLIN HFA) 108 (90 Base) MCG/ACT  inhaler Inhale 2 puffs into the lungs every 6 (six) hours as needed for wheezing or shortness of breath. 1 Inhaler 2  . metoprolol tartrate (LOPRESSOR) 25 MG tablet Take 1 tablet (25 mg total) by mouth 2 (two) times daily. 180 tablet 3  . sertraline (ZOLOFT) 100 MG tablet TAKE 1 TABLET (100 MG TOTAL) BY MOUTH EVERY EVENING. 90 tablet 0  . SYNTHROID 88 MCG tablet Take 1 tablet (88 mcg total) by mouth daily. 90 tablet 3  . thalidomide (THALOMID) 50 MG capsule Take 50 mg by mouth daily.    . Vitamin D, Ergocalciferol, (DRISDOL) 1.25 MG (50000 UT) CAPS capsule TAKE 1 CAPSULE BY MOUTH ONE TIME PER WEEK 12 capsule 0   No current facility-administered medications for this visit.     Allergies:   Codeine, Fish allergy, Iohexol, Pindolol, and Iodinated diagnostic agents    Social History:  The patient  reports that she quit smoking about 9 years ago. Her smoking use included cigarettes. She has a 10.00 pack-year smoking history. She has never used smokeless tobacco. She reports current alcohol use of about 1.0 - 2.0 standard drinks of alcohol per week. She reports that she does not use drugs.   Family History:  The patient's family history includes Alcohol abuse in her mother; Bipolar disorder in her maternal aunt; Cancer in her maternal aunt and paternal grandmother; Diabetes in her maternal aunt; Emphysema in her paternal grandfather; OCD in her maternal aunt; Skin cancer in her father; Stroke in her maternal grandmother.    ROS: All other systems are reviewed and negative. Unless otherwise mentioned in H&P    PHYSICAL EXAM: VS:  BP (!) 166/92 (BP Location: Left Arm, Patient Position: Sitting, Cuff Size: Normal)   Pulse 92   Temp 99.2 F (37.3 C)   Ht 5\' 6"  (1.676 m)   Wt 188 lb (85.3 kg)   BMI 30.34 kg/m  , BMI Body mass index is 30.34 kg/m. GEN: Well nourished, well developed, in no acute distress HEENT: normal Neck: no JVD, carotid bruits, or masses Cardiac: RRR;tachycardia,  no  murmurs, rubs, or gallops,no edema  Respiratory:  Clear to auscultation bilaterally, normal work of breathing GI: soft, nontender, nondistended, + BS MS: no deformity or atrophy Skin: warm and dry, no rash Neuro:  Strength and sensation are intact Psych: euthymic mood, full affect, some tremors noted.    EKG: Sinus tachycardia rate o 92 bpm, LAE.   Recent Labs: 02/26/2019: BUN 9; Creatinine, Ser 0.62; Hemoglobin 13.4; Platelets 255; Potassium 4.4; Sodium 135; TSH 7.420    Lipid Panel    Component Value Date/Time   CHOL 285 (H) 07/26/2017 1128   TRIG 59 07/26/2017 1128   HDL 116 07/26/2017 1128   CHOLHDL 2.5 07/26/2017 1128   VLDL 11 04/01/2015 0001   LDLCALC 154 (H) 07/26/2017 1128      Wt Readings from Last 3 Encounters:  03/19/19 188 lb (85.3 kg)  03/03/19 189 lb (85.7 kg)  02/26/19 188 lb 9.6 oz (85.5 kg)      Other studies Reviewed: Echocardiogram 05/01/219 Left ventricle: The cavity size was normal. Wall thickness was   normal. Systolic function was normal. The estimated ejection   fraction was in the range of 55% to 60%. Wall motion was normal;   there were no regional wall motion abnormalities. Left   ventricular diastolic function parameters were normal. - Mitral valve: Mild bileaflet mitral valve prolapse. There was   moderate, eccentric regurgitation.  ASSESSMENT AND PLAN:  1.  Tachycardia: Heart rate remains elevated despite taking metoprolol 12.5 mg twice daily, I will go up to 25 mg twice daily.  Cardiac monitor has not yet been downloaded to evaluate her average heart rate and rhythm.  If stenosis is available we will be able to ascertain better how to provide treatment for her heart rate.  Echocardiogram completed last year did not reveal any diastolic dysfunction valvular abnormalities or LVH.  2.  Hypertension: Blood pressure is elevated today in the office.  I rechecked it and found it to be 148/78.  Still not optimal.  Hopefully going up on  metoprolol to 25 mg twice daily will be helpful.  May consider additional medications if necessary.  Will await monitor.  3.  Hypothyroidism: Most recent TSH was elevated on labs however T3 and T4 were normal.  Being followed by PCP on Synthroid.  Doubt this is contributing to her tachycardia.  4.  Depression and anxiety: Remains on Zoloft 100 mg daily.  To be followed by primary care.  May need referral to psychiatry if not well controlled.  Leave to discretion of the PCP.   Current medicines are reviewed at length with the patient today.    Labs/ tests ordered today include: None.  Phill Myron. West Pugh, ANP, AACC   03/19/2019 10:02 AM    [place] [address] Office [phone] Fax [phone]

## 2019-03-19 ENCOUNTER — Other Ambulatory Visit: Payer: Self-pay

## 2019-03-19 ENCOUNTER — Ambulatory Visit: Payer: BC Managed Care – PPO | Admitting: Adult Health

## 2019-03-19 ENCOUNTER — Encounter: Payer: Self-pay | Admitting: Adult Health

## 2019-03-19 VITALS — BP 166/92 | HR 92 | Temp 99.2°F | Ht 66.0 in | Wt 188.0 lb

## 2019-03-19 DIAGNOSIS — I10 Essential (primary) hypertension: Secondary | ICD-10-CM

## 2019-03-19 DIAGNOSIS — R0602 Shortness of breath: Secondary | ICD-10-CM | POA: Diagnosis not present

## 2019-03-19 DIAGNOSIS — F329 Major depressive disorder, single episode, unspecified: Secondary | ICD-10-CM

## 2019-03-19 DIAGNOSIS — R000 Tachycardia, unspecified: Secondary | ICD-10-CM | POA: Diagnosis not present

## 2019-03-19 DIAGNOSIS — Z79899 Other long term (current) drug therapy: Secondary | ICD-10-CM

## 2019-03-19 DIAGNOSIS — F32A Depression, unspecified: Secondary | ICD-10-CM

## 2019-03-19 DIAGNOSIS — F419 Anxiety disorder, unspecified: Secondary | ICD-10-CM | POA: Diagnosis not present

## 2019-03-19 DIAGNOSIS — R079 Chest pain, unspecified: Secondary | ICD-10-CM | POA: Diagnosis not present

## 2019-03-19 MED ORDER — METOPROLOL TARTRATE 25 MG PO TABS: 25.0000 mg | ORAL_TABLET | Freq: Two times a day (BID) | ORAL | 3 refills | Status: DC

## 2019-03-19 NOTE — Patient Instructions (Signed)
Medication Instructions:  INCREASE- Metoprolol 25 mg by mouth twice a day  If you need a refill on your cardiac medications before your next appointment, please call your pharmacy.  Labwork: None Ordered   Testing/Procedures: None Ordered  Follow-Up: . Your physician recommends that you schedule a follow-up appointment in: September 23rd @ 9:45 am   At [facility], you and your health needs are our priority.  As part of our continuing mission to provide you with exceptional heart care, we have created designated Provider Care Teams.  These Care Teams include your primary Cardiologist (physician) and Advanced Practice Providers (APPs -  Physician Assistants and Nurse Practitioners) who all work together to provide you with the care you need, when you need it.  Thank you for choosing [facility] at [facility]!!

## 2019-03-20 ENCOUNTER — Other Ambulatory Visit: Payer: Self-pay | Admitting: Cardiology

## 2019-03-20 DIAGNOSIS — R000 Tachycardia, unspecified: Secondary | ICD-10-CM | POA: Diagnosis not present

## 2019-03-20 DIAGNOSIS — R0609 Other forms of dyspnea: Secondary | ICD-10-CM

## 2019-03-20 DIAGNOSIS — R0789 Other chest pain: Secondary | ICD-10-CM | POA: Diagnosis not present

## 2019-03-20 DIAGNOSIS — R9431 Abnormal electrocardiogram [ECG] [EKG]: Secondary | ICD-10-CM | POA: Diagnosis not present

## 2019-03-20 DIAGNOSIS — R0600 Dyspnea, unspecified: Secondary | ICD-10-CM

## 2019-03-20 DIAGNOSIS — E782 Mixed hyperlipidemia: Secondary | ICD-10-CM | POA: Diagnosis not present

## 2019-03-22 DIAGNOSIS — Z20828 Contact with and (suspected) exposure to other viral communicable diseases: Secondary | ICD-10-CM | POA: Diagnosis not present

## 2019-03-22 DIAGNOSIS — Z01812 Encounter for preprocedural laboratory examination: Secondary | ICD-10-CM | POA: Diagnosis not present

## 2019-03-26 DIAGNOSIS — R9431 Abnormal electrocardiogram [ECG] [EKG]: Secondary | ICD-10-CM | POA: Diagnosis not present

## 2019-03-26 DIAGNOSIS — R0789 Other chest pain: Secondary | ICD-10-CM | POA: Diagnosis not present

## 2019-03-26 DIAGNOSIS — E782 Mixed hyperlipidemia: Secondary | ICD-10-CM | POA: Diagnosis not present

## 2019-03-31 DIAGNOSIS — R9431 Abnormal electrocardiogram [ECG] [EKG]: Secondary | ICD-10-CM | POA: Diagnosis not present

## 2019-03-31 DIAGNOSIS — G4733 Obstructive sleep apnea (adult) (pediatric): Secondary | ICD-10-CM | POA: Diagnosis not present

## 2019-03-31 DIAGNOSIS — R0789 Other chest pain: Secondary | ICD-10-CM | POA: Diagnosis not present

## 2019-03-31 DIAGNOSIS — E782 Mixed hyperlipidemia: Secondary | ICD-10-CM | POA: Diagnosis not present

## 2019-04-08 NOTE — Progress Notes (Signed)
Cardiology Office Note   Date:  04/08/2019   ID:  Lisa Waters, DOB 12/26/1980, MRN [id number]  PCP:  Lisa Hurl, PA-C  Cardiologist:  Dr. Ellyn Waters  No chief complaint on file.    History of Present Illness: Lisa Waters is a 38 y.o. female who presents for ongoing assessment and management of mitral valve disease and mitral valve prolapse by history, with moderate MR.  Other history includes tachycardia, chronic dyspnea on exertion.  A 7-day ZIO monitor was planned.  On last office visit dated 03/19/2019 the patient's metoprolol dose was increased from 12.5 mg twice daily to 25 mg twice daily, as she continued to have rapid heart rhythm.  The patient's blood pressure was also elevated at 148/78.  It was helped with increased dose of metoprolol with help with heart rate and blood pressure control.  Cardiac monitor average heart rate was 96 bpm with a heart rate elevation 171 bpm upon review of the report.  There is no evidence of SVT, arrhythmias, or pauses.  She comes today with continued complaints of racing heart rate.  She is not tolerating metoprolol and she states it makes her feel dizzy and more fatigued.  She is having elevated heart rates as above.  She denies any chest pain or shortness of breath associated.  She requests that we change her medication away from the metoprolol.   Past Medical History:  Diagnosis Date  . Anxiety   . Asthma    mild intermittent as of 07/2017 - just when patient gets sick  . Depression   . Female infertility   . GERD (gastroesophageal reflux disease)    Hx - no current problems, no meds  . H/O amaurosis fugax Fall 2012   Resolved, no current problems, Hx-MRI of brain/brain stem: no acute abnormality, small area of encephalomalacia left superior cerebellum that could be prior trauma, infection, or lacunar infarct  . History of Hodgkin's lymphoma 2007   in remission, sees Lisa Waters, prior with Lisa Waters; prior chemotherapy and radiation  therapy; sees yearly in December, no current problems  . Hyperlipidemia 07/2017   diet control, no meds  . Hypothyroidism   . Moderate mitral regurgitation by prior echocardiogram 09/2010   10/2015: EF 55-60%. Normal WM. Normal D Fxn. Bilateral MVP w/ Mod MR (very eccentric). Normal LA, RV & RA. Normal PAP.  Marland Kitchen MVP (mitral valve prolapse) 09/2010   Dr. Ellyn Waters, [facility]; a) Echo 3/'12: Mod MR; EF 60-65%; b) TEE 5/'12: Nl LV Fxn, EF 60-65%, mild, holosystolic prolapse of the medial anterior leaflet. Mod MR . c) Echo 3/'15: Moderate, holosystolic prolapse of  medial. Mod MR d) TM Stress Echo (@ Lisa Waters) Nl Lv Fxn, Mild-Mod MR @ Res0 --> Mod MR at HR 181 bpm (7:22 min, 10.10 METS) w/p WMA  . Oral ulcer   . Palpitations 10/2012   holter monitor, Dr. Ellyn Waters; no arrhythmia, no current problems    Past Surgical History:  Procedure Laterality Date  . APPENDECTOMY  1993  . BREAST ENHANCEMENT SURGERY  2002  . COLONOSCOPY  07/2014   Lisa Waters - normal  . DILATATION & CURETTAGE/HYSTEROSCOPY WITH MYOSURE N/A 01/29/2018   Procedure: DILATATION & CURETTAGE/HYSTEROSCOPY WITH MYOSURE POLYPECTOMY;  Surgeon: Lisa Bigger, MD;  Location: [facility];  Service: Gynecology;  Laterality: N/A;  . ESOPHAGOGASTRODUODENOSCOPY  04/11/13   Lisa Waters Lisa Waters  . FOOT SURGERY Bilateral    on toes  . INSERTION CENTRAL VENOUS ACCESS DEVICE W/ SUBCUTANEOUS PORT  2006  .  LYMPH NODE BIOPSY  2006   under right arm  . NM MYOCAR PERF WALL MOTION  12/2010   persantine myoview - moderate breast attenuation (fixed mid-distal anterior defect), EF 68%, low risk scan  . RHINOPLASTY  2007   deviated septum  . TEE WITHOUT CARDIOVERSION  11/2010   Nl LV Fxn, EF 60-65%, mild, holosystolic prolapse of the medial anterior leaflet. Mod MR .   Marland Kitchen TRANSTHORACIC ECHOCARDIOGRAM  3/'14; 3/'15   LVEF 60-65%, wall motion normal, LV function normal, moderate holosystolic MVP (medial the middle scallop of the posterior leaflet), moderate  regurgitation (directed posteriorly), no shunt -- stable over 2 years  . TRANSTHORACIC ECHOCARDIOGRAM  10/2015   EF 55-60%. Normal WM. Normal D Fxn. Bilateral MVP w/ Mod MR (very eccentric). Normal LA, RV & RA. Normal PAP.  . WISDOM TOOTH EXTRACTION       Current Outpatient Medications  Medication Sig Dispense Refill  . albuterol (PROVENTIL HFA;VENTOLIN HFA) 108 (90 Base) MCG/ACT inhaler Inhale 2 puffs into the lungs every 6 (six) hours as needed for wheezing or shortness of breath. 1 Inhaler 2  . metoprolol tartrate (LOPRESSOR) 25 MG tablet Take 1 tablet (25 mg total) by mouth 2 (two) times daily. 180 tablet 3  . sertraline (ZOLOFT) 100 MG tablet TAKE 1 TABLET (100 MG TOTAL) BY MOUTH EVERY EVENING. 90 tablet 0  . SYNTHROID 88 MCG tablet Take 1 tablet (88 mcg total) by mouth daily. 90 tablet 3  . thalidomide (THALOMID) 50 MG capsule Take 50 mg by mouth daily.    . Vitamin D, Ergocalciferol, (DRISDOL) 1.25 MG (50000 UT) CAPS capsule TAKE 1 CAPSULE BY MOUTH ONE TIME PER WEEK 12 capsule 0   No current facility-administered medications for this visit.     Allergies:   Codeine, Fish allergy, Iohexol, Pindolol, and Iodinated diagnostic agents    Social History:  The patient  reports that she quit smoking about 9 years ago. Her smoking use included cigarettes. She has a 10.00 pack-year smoking history. She has never used smokeless tobacco. She reports current alcohol use of about 1.0 - 2.0 standard drinks of alcohol per week. She reports that she does not use drugs.   Family History:  The patient's family history includes Alcohol abuse in her mother; Bipolar disorder in her maternal aunt; Cancer in her maternal aunt and paternal grandmother; Diabetes in her maternal aunt; Emphysema in her paternal grandfather; OCD in her maternal aunt; Skin cancer in her father; Stroke in her maternal grandmother.    ROS: All other systems are reviewed and negative. Unless otherwise mentioned in H&P     PHYSICAL EXAM: VS:  There were no vitals taken for this visit. , BMI There is no height or weight on file to calculate BMI. GEN: Well nourished, well developed, in no acute distress HEENT: normal Neck: no JVD, carotid bruits, or masses Cardiac:RRR; tachycardic, no murmurs, rubs, or gallops,no edema  Respiratory:  Clear to auscultation bilaterally, normal work of breathing GI: soft, nontender, nondistended, + BS MS: no deformity or atrophy Skin: warm and dry, no rash Neuro:  Strength and sensation are intact Psych: euthymic mood, full affect   EKG: Sinus tachycardia, heart rate of 96 bpm.  Recent Labs: 02/26/2019: BUN 9; Creatinine, Ser 0.62; Hemoglobin 13.4; Platelets 255; Potassium 4.4; Sodium 135; TSH 7.420    Lipid Panel    Component Value Date/Time   CHOL 285 (H) 07/26/2017 1128   TRIG 59 07/26/2017 1128   HDL 116 07/26/2017 1128  CHOLHDL 2.5 07/26/2017 1128   VLDL 11 04/01/2015 0001   LDLCALC 154 (H) 07/26/2017 1128      Wt Readings from Last 3 Encounters:  03/19/19 188 lb (85.3 kg)  03/03/19 189 lb (85.7 kg)  02/26/19 188 lb 9.6 oz (85.5 kg)      Other studies Reviewed: Cardiac Monitor Patient had a min HR of 70 bpm, max HR of 171 bpm, and avg HR of 96 bpm. Predominant underlying rhythm was Sinus Rhythm. Slight P wave orphology changes were noted. Isolated SVEs were rare (<1.0%), SVE Couplets were rare (<1.0%), and no SVE Triplets were present. Isolated VEs were rare (<1.0%), and no VE Couplets or VE Triplets were present.  Echocardiogram 4/26/20219 Left ventricle: The cavity size was normal. Wall thickness was normal. Systolic function was normal. The estimated ejection fraction was in the range of 55% to 60%. Wall motion was normal; there were no regional wall motion abnormalities. Left ventricular diastolic function parameters were normal. - Mitral valve: Mild bileaflet mitral valve prolapse. There was moderate, eccentric regurgitation.   ASSESSMENT AND PLAN:  1.  Frequent palpitations with racing heart rate: Review of ZIO monitor did confirm that she is having an average heart rate in the high 90s, with episodes of rapid heart rate although rare and inconsistent.    She has not tolerated metoprolol as this is making her feel dizzy and more fatigued.  I will change her to diltiazem 120 mg p.o. daily which will assist in heart rate control and blood pressure control.  It may be possible that her albuterol inhaler is contributing to heart rate.  Can consider changing to Xopenex.  She will follow-up with PCP concerning this.  2.  Hypertension: Blood pressure is elevated today.  Starting diltiazem should help with both.  I have let her know that she may have some lower extremity puffiness on the calcium channel blocker.  She verbalizes understanding.  3.  Hypothyroidism: Being followed by PCP on Synthroid.  Last TSH was within normal limits at 7.420 on 02/26/2019.  Current medicines are reviewed at length with the patient today.    Labs/ tests ordered today include: None Phill Myron. West Pugh, ANP, AACC   04/08/2019 7:45 AM    [facility] [place] Office [phone] Fax [phone]

## 2019-04-09 ENCOUNTER — Encounter: Payer: Self-pay | Admitting: Adult Health

## 2019-04-09 ENCOUNTER — Ambulatory Visit: Payer: BC Managed Care – PPO | Admitting: Adult Health

## 2019-04-09 ENCOUNTER — Other Ambulatory Visit: Payer: Self-pay

## 2019-04-09 VITALS — BP 152/98 | HR 101 | Temp 99.8°F | Ht 66.0 in | Wt 188.0 lb

## 2019-04-09 DIAGNOSIS — I341 Nonrheumatic mitral (valve) prolapse: Secondary | ICD-10-CM | POA: Diagnosis not present

## 2019-04-09 DIAGNOSIS — I10 Essential (primary) hypertension: Secondary | ICD-10-CM | POA: Diagnosis not present

## 2019-04-09 DIAGNOSIS — S62669A Nondisplaced fracture of distal phalanx of unspecified finger, initial encounter for closed fracture: Secondary | ICD-10-CM | POA: Diagnosis not present

## 2019-04-09 DIAGNOSIS — R000 Tachycardia, unspecified: Secondary | ICD-10-CM | POA: Diagnosis not present

## 2019-04-09 MED ORDER — DILTIAZEM HCL ER COATED BEADS 120 MG PO CP24: 120.0000 mg | ORAL_CAPSULE | Freq: Every day | ORAL | 6 refills | Status: DC

## 2019-04-09 NOTE — Patient Instructions (Signed)
Medication Instructions:  STOP- Metoprolol START- Diltiazem 120 mg by mouth daily  If you need a refill on your cardiac medications before your next appointment, please call your pharmacy.  Labwork: None Ordered   Testing/Procedures: None Ordered  Follow-Up: . Your physician recommends that you schedule a follow-up appointment in: Keep follow up appointment with [name]   At [facility], you and your health needs are our priority.  As part of our continuing mission to provide you with exceptional heart care, we have created designated Provider Care Teams.  These Care Teams include your primary Cardiologist (physician) and Advanced Practice Providers (APPs -  Physician Assistants and Nurse Practitioners) who all work together to provide you with the care you need, when you need it.  Thank you for choosing [facility] at [facility]!!

## 2019-04-17 DIAGNOSIS — S62634A Displaced fracture of distal phalanx of right ring finger, initial encounter for closed fracture: Secondary | ICD-10-CM | POA: Diagnosis not present

## 2019-04-17 DIAGNOSIS — S63521A Sprain of radiocarpal joint of right wrist, initial encounter: Secondary | ICD-10-CM | POA: Diagnosis not present

## 2019-04-17 DIAGNOSIS — S62639A Displaced fracture of distal phalanx of unspecified finger, initial encounter for closed fracture: Secondary | ICD-10-CM | POA: Insufficient documentation

## 2019-04-17 DIAGNOSIS — S63501A Unspecified sprain of right wrist, initial encounter: Secondary | ICD-10-CM | POA: Insufficient documentation

## 2019-04-23 ENCOUNTER — Ambulatory Visit: Payer: BC Managed Care – PPO | Admitting: Cardiology

## 2019-04-23 ENCOUNTER — Encounter: Payer: Self-pay | Admitting: Cardiology

## 2019-04-23 ENCOUNTER — Other Ambulatory Visit: Payer: Self-pay

## 2019-04-23 VITALS — BP 107/70 | HR 85 | Ht 67.0 in | Wt 185.0 lb

## 2019-04-23 DIAGNOSIS — R002 Palpitations: Secondary | ICD-10-CM | POA: Diagnosis not present

## 2019-04-23 DIAGNOSIS — Z9189 Other specified personal risk factors, not elsewhere classified: Secondary | ICD-10-CM | POA: Diagnosis not present

## 2019-04-23 DIAGNOSIS — I340 Nonrheumatic mitral (valve) insufficiency: Secondary | ICD-10-CM

## 2019-04-23 DIAGNOSIS — Z923 Personal history of irradiation: Secondary | ICD-10-CM | POA: Diagnosis not present

## 2019-04-23 DIAGNOSIS — I341 Nonrheumatic mitral (valve) prolapse: Secondary | ICD-10-CM | POA: Diagnosis not present

## 2019-04-23 DIAGNOSIS — F411 Generalized anxiety disorder: Secondary | ICD-10-CM | POA: Diagnosis not present

## 2019-04-23 DIAGNOSIS — C8190 Hodgkin lymphoma, unspecified, unspecified site: Secondary | ICD-10-CM | POA: Diagnosis not present

## 2019-04-23 DIAGNOSIS — J4520 Mild intermittent asthma, uncomplicated: Secondary | ICD-10-CM

## 2019-04-23 NOTE — Progress Notes (Deleted)
.   PCP: Carlena Hurl, PA-C  Clinic Note: No chief complaint on file.   HPI: Lisa Waters is a 38 y.o. female with a cardiac history notable for mitral prolapse with moderate mitral vegetation and palpitations below who presents today for 2-week follow-up  I have not seen Lisa Waters since April 2018.  Lisa Waters was last seen on September 23 by Jory Sims, NP in follow-up from a visit on September 2 with complaints of palpitations.  She wore a 7-day ZIO patch monitor.  Metoprolol dose was increased 25 mg twice daily for both rapid heart rates and elevated blood pressure.  She continued to note racing heart rate did not feel well with metoprolol.. ->  Converted to diltiazem.  Recent Hospitalizations:   None  Studies Personally Reviewed - (if available, images/films reviewed: From Epic Chart or Care Everywhere)  2D Echo April 2019: EF 55 to 60%.  No R WMA.  Mild bileaflet prolapse.  Moderate MR with normal pulmonary vein flow.  (Stable from 2017).  ZIO Patch monitor: Minimum heart rate 70 bpm.  Maximum 171 bpm with an average of 96 bpm.  Otherwise normal monitor.  No arrhythmias.  No SVT or PAT.  Minimal/rare PVCs/PACs.  Interval History: ***   No chest pain or shortness of breath with rest or exertion. No PND, orthopnea or edema. No palpitations, lightheadedness, dizziness, weakness or syncope/near syncope. No TIA/amaurosis fugax symptoms. No melena, hematochezia, hematuria, or epstaxis. No claudication.  ROS: A comprehensive was performed. ROS   I have reviewed and (if needed) personally updated the patient's problem list, medications, allergies, past medical and surgical history, social and family history.   Past Medical History:  Diagnosis Date   Anxiety    Asthma    mild intermittent as of 07/2017 - just when patient gets sick   Depression    Female infertility    GERD (gastroesophageal reflux disease)    Hx - no current problems, no meds   H/O  amaurosis fugax Fall 2012   Resolved, no current problems, Hx-MRI of brain/brain stem: no acute abnormality, small area of encephalomalacia left superior cerebellum that could be prior trauma, infection, or lacunar infarct   History of Hodgkin's lymphoma 2007   in remission, sees [facility], prior with Dr. Elwanda Brooklyn; prior chemotherapy and radiation therapy; sees yearly in December, no current problems   Hyperlipidemia 07/2017   diet control, no meds   Hypothyroidism    Moderate mitral regurgitation by prior echocardiogram 09/2010   10/2015: EF 55-60%. Normal WM. Normal D Fxn. Bilateral MVP w/ Mod MR (very eccentric). Normal LA, RV & RA. Normal PAP.   MVP (mitral valve prolapse) 09/2010   Dr. Ellyn Hack, [facility]; a) Echo 3/'12: Mod MR; EF 60-65%; b) TEE 5/'12: Nl LV Fxn, EF 60-65%, mild, holosystolic prolapse of the medial anterior leaflet. Mod MR . c) Echo 3/'15: Moderate, holosystolic prolapse of  medial. Mod MR d) TM Stress Echo (@ [facility]) Nl Lv Fxn, Mild-Mod MR @ Res0 --> Mod MR at HR 181 bpm (7:22 min, 10.10 METS) w/p WMA   Oral ulcer    Palpitations 10/2012   holter monitor, Dr. Ellyn Hack; no arrhythmia, no current problems    Past Surgical History:  Procedure Laterality Date   [place]  2002   COLONOSCOPY  07/2014   Dr. Collene Mares - normal   [place] N/A 01/29/2018   Procedure: DILATATION & CURETTAGE/HYSTEROSCOPY WITH MYOSURE POLYPECTOMY;  Surgeon: Charyl Bigger, MD;  Location: [facility];  Service: Gynecology;  Laterality: N/A;   ESOPHAGOGASTRODUODENOSCOPY  04/11/13   [facility] Dr. Collene Mares   FOOT SURGERY Bilateral    on toes   INSERTION CENTRAL VENOUS ACCESS DEVICE W/ SUBCUTANEOUS PORT  2006   LYMPH NODE BIOPSY  2006   under right arm   NM MYOCAR PERF WALL MOTION  12/2010   persantine myoview - moderate breast attenuation (fixed mid-distal anterior defect), EF 68%, low risk scan    RHINOPLASTY  2007   deviated septum   TEE WITHOUT CARDIOVERSION  11/2010   Nl LV Fxn, EF 60-65%, mild, holosystolic prolapse of the medial anterior leaflet. Mod MR .    TRANSTHORACIC ECHOCARDIOGRAM  3/'14; 3/'15   LVEF 60-65%, wall motion normal, LV function normal, moderate holosystolic MVP (medial the middle scallop of the posterior leaflet), moderate regurgitation (directed posteriorly), no shunt -- stable over 2 years   TRANSTHORACIC ECHOCARDIOGRAM  10/2015   EF 55-60%. Normal WM. Normal D Fxn. Bilateral MVP w/ Mod MR (very eccentric). Normal LA, RV & RA. Normal PAP.   WISDOM TOOTH EXTRACTION      No outpatient medications have been marked as taking for the 04/23/19 encounter (Appointment) with Leonie Man, MD.    Allergies  Allergen Reactions   Codeine Itching   Fish Allergy Itching    Mahi Mahi   Iohexol Hives   Pindolol     Avoid non selective beta blockers due to dyspnea   Iodinated Diagnostic Agents Rash    Has received since rash developed without pre-meds without development of rash.    Social History   Tobacco Use   Smoking status: Former Smoker    Packs/day: 1.00    Years: 10.00    Pack years: 10.00    Types: Cigarettes    Quit date: 11/07/2009    Years since quitting: 9.4   Smokeless tobacco: Never Used  Substance Use Topics   Alcohol use: Yes    Alcohol/week: 1.0 - 2.0 standard drinks    Types: 1 - 2 Cans of beer per week    Comment: social Beer/Liquor   Drug use: No   Social History   Social History Narrative   Married, 0 children, has failed fertility treatment & has given up attempts at IVF as well as adoption. 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  Wt Readings from Last 3 Encounters:  04/09/19 188 lb (85.3 kg)  03/19/19 188 lb (85.3 kg)  03/03/19 189 lb (85.7 kg)    PHYSICAL EXAM There were no vitals taken for this visit. Physical Exam   Adult ECG Report  Rate: *** ;  Rhythm: {rhythm:17366};   Narrative Interpretation: ***   Other studies Reviewed: Additional studies/ records that were reviewed today include:  Recent Labs:  ***     ASSESSMENT / PLAN: Problem List Items Addressed This Visit    Moderate mitral regurgitation by prior echocardiogram - Primary (Chronic)    Stable moderate MR on echo.  Continue to follow symptoms but otherwise will  check follow-up echo in April 2021      MVP (mitral valve prolapse) (Chronic)    Initially thought to be relatively significant, now has been read as mild bileaflet prolapse.  Current guidelines do not recommend SBP prophylaxis.      Palpitation (Chronic)    Has been changed to Bystolic long ago, now no longer on beta-blocker.  Did not tolerate metoprolol.  Switch to diltiazem 120 mg last visit -consider titrating further.          I spent a total of ***minutes with the patient and chart review. >  50% of the time was spent in direct patient consultation.   Current medicines are reviewed at length with the patient today.  (+/- concerns) *** The following changes have been made:  ***  There are no Patient Instructions on file for this visit.   Studies Ordered:   No orders of the defined types were placed in this encounter.     Glenetta Hew, M.D., M.S. Interventional Cardiologist   Pager # [phone] Phone # [phone] [address] [place], [place] [address]   Thank you for choosing Heartcare at [facility]!!

## 2019-04-23 NOTE — Patient Instructions (Addendum)
Medication Instructions:  CONTINUE ALL MEDICATIONS  CONTINUE DILTIAZEM  WATCH FOR INCREASE HEART RATE - IF BECOMES MORE FREQUENT CONTACT OFFICE    If you need a refill on your cardiac medications before your next appointment, please call your pharmacy.   Lab work:  NOT NEEDED  Testing/Procedures: WILL BE SCHEDULE IN April 2021-  AT [place] [address] Your physician has requested that you have an echocardiogram. Echocardiography is a painless test that uses sound waves to create images of your heart. It provides your doctor with information about the size and shape of your heart and how well your heart's chambers and valves are working. This procedure takes approximately one hour. There are no restrictions for this procedure.    Follow-Up: At [facility], you and your health needs are our priority.  As part of our continuing mission to provide you with exceptional heart care, we have created designated Provider Care Teams.  These Care Teams include your primary Cardiologist (physician) and Advanced Practice Providers (APPs -  Physician Assistants and Nurse Practitioners) who all work together to provide you with the care you need, when you need it. . You will need a follow up appointment in 12 months  OCT 2021 WITH DR [place].  Please call our office 2 months in advance to schedule this appointment.  You may see Glenetta Hew, MD or one of the following Advanced Practice Providers on your designated Care Team:   . Rosaria Ferries, PA-C . Jory Sims, DNP, ANP- 6 months April ( VIRTUAL)  Any Other Special Instructions Will Be Listed Below.

## 2019-04-23 NOTE — Assessment & Plan Note (Signed)
Has been changed to Bystolic long ago, now no longer on beta-blocker.  Did not tolerate metoprolol.  Switch to diltiazem 120 mg last visit -consider titrating further.

## 2019-04-23 NOTE — Assessment & Plan Note (Signed)
Stable moderate MR on echo.  Continue to follow symptoms but otherwise will check follow-up echo in April 2021

## 2019-04-23 NOTE — Assessment & Plan Note (Signed)
Initially thought to be relatively significant, now has been read as mild bileaflet prolapse.  Current guidelines do not recommend SBP prophylaxis.

## 2019-04-28 ENCOUNTER — Encounter: Payer: Self-pay | Admitting: Cardiology

## 2019-04-28 NOTE — Assessment & Plan Note (Signed)
Seems to have stabilized out with Zoloft.

## 2019-04-28 NOTE — Assessment & Plan Note (Signed)
Ok to use inhalers (Albuterol - may do better with Xopenex).  This was the actual try source of her SOB & chest discomfert.

## 2019-04-28 NOTE — Progress Notes (Signed)
PCP: Carlena Hurl, PA-C  Clinic Note: Chief Complaint  Patient presents with  . 2 Week Follow-up    After being seen by NP  . Palpitations  . Mitral Valve Prolapse    With MR     HPI:   Lisa Waters is a 38 y.o. female with a cardiac history notable for moderate mitral regurgitation with mitral prolapse and recurrent palpitations who presents here for 2-week follow-up after monitor  I have not seen Lisa Waters since April 2018.  Lisa Waters was last seen on September 23 by Jory Sims, NP in follow-up from a visit on September 2 with complaints of palpitations.  She wore a 7-day ZIO patch monitor.  Metoprolol dose was increased 25 mg twice daily for both rapid heart rates and elevated blood pressure.  She continued to note racing heart rate did not feel well with metoprolol.. ->  Converted to diltiazem.  Recent Hospitalizations: None  Reviewed  CV studies:   The following studies were reviewed today: (if available, images/films reviewed: From Epic Chart or Care Everywhere)  2D Echo April 2019: EF 55 to 60%.  No R WMA.  Mild bileaflet prolapse.  Moderate MR with normal pulmonary vein flow.  (Stable froReviewed  CV studies: m 2017).  ZIO Patch monitor September 2020: Minimum heart rate 70 bpm.  Maximum 171 bpm with an average of 96 bpm.  Otherwise normal monitor.  No arrhythmias.  No SVT or PAT.  Minimal/rare PVCs/PACs.  Interval History   Lisa Waters returns today very happy with the fact that she is feeling much better on diltiazem compared to beta-blocker.  Energy level is notably improved.  She is not feeling lethargic and dragging.  Her blood pressures have not been as low. She has had some cold and allergy type symptoms and was curious about using an inhaler and how that would affect her palpitations.  I indicated it probably would be okay.  Actually though she had been doing pretty well until this most recent episode and now that she is on diltiazem is feeling much better.   There is discussion that she does think about having prophylactic radical ostectomy to avoid recurrence of breast cancer.  This would be essentially a preop evaluation.  She is not all that active, but is able to easily do at least 8 METS without difficulty.  Cardiovascular review of symptoms: : no chest pain or dyspnea on exertion positive for - irregular heartbeat, palpitations and But significant improved on diltiazem negative for - edema, orthopnea, paroxysmal nocturnal dyspnea, shortness of breath or Syncope/near syncope, TIA/amaurosis fugax, claudication.  The patient does not have symptoms concerning for COVID-19 infection (fever, chills, cough, or new shortness of breath).  The patient is practicing social distancing.  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  She wears a mask.  They did go grocery shopping and have gone to some restaurants but are very careful.  REVIEWED OF SYSTEMS   ROS: A comprehensive was performed. Review of Systems  Constitutional: Negative for malaise/fatigue (Energy level is much better on diltiazem compared to metoprolol.) and weight loss.  HENT: Negative for congestion.   Respiratory: Negative for cough, sputum production and wheezing.   Gastrointestinal: Negative for blood in stool, diarrhea, heartburn and melena.  Genitourinary: Negative for dysuria and hematuria.  Musculoskeletal: Negative for falls and joint pain.  Neurological: Positive for dizziness (Off and on but not routine). Negative for focal weakness.  Psychiatric/Behavioral: Negative  for depression (Stable on Zoloft). The patient is not nervous/anxious (But stable).   All other systems reviewed and are negative.  I have reviewed and (if needed) personally updated the patient's problem list, medications, allergies, past medical and surgical history, social and family history.   PAST MEDICAL HISTORY   Past Medical History:  Diagnosis Date  .  Anxiety   . Asthma    mild intermittent as of 07/2017 - just when patient gets sick  . Depression   . Female infertility   . GERD (gastroesophageal reflux disease)    Hx - no current problems, no meds  . H/O amaurosis fugax Fall 2012   Resolved, no current problems, Hx-MRI of brain/brain stem: no acute abnormality, small area of encephalomalacia left superior cerebellum that could be prior trauma, infection, or lacunar infarct  . History of Hodgkin's lymphoma 2007   in remission, sees [facility], prior with Dr. Elwanda Brooklyn; prior chemotherapy and radiation therapy; sees yearly in December, no current problems  . Hyperlipidemia 07/2017   diet control, no meds  . Hypothyroidism   . Moderate mitral regurgitation by prior echocardiogram 09/2010   10/2015: EF 55-60%. Normal WM. Normal D Fxn. Bilateral MVP w/ Mod MR (very eccentric). Normal LA, RV & RA. Normal PAP.  [place] MVP (mitral valve prolapse) 09/2010   Dr. Ellyn Hack, [facility]; a) Echo 3/'12: Mod MR; EF 60-65%; b) TEE 5/'12: Nl LV Fxn, EF 60-65%, mild, holosystolic prolapse of the medial anterior leaflet. Mod MR . c) Echo 3/'15: Moderate, holosystolic prolapse of  medial. Mod MR d) TM Stress Echo (@ [facility]) Nl Lv Fxn, Mild-Mod MR @ Res0 --> Mod MR at HR 181 bpm (7:22 min, 10.10 METS) w/p WMA  . Oral ulcer   . Palpitations 10/2012   holter monitor, Dr. Ellyn Hack; no arrhythmia, no current problems   PAST SURGICAL HISTORY   Past Surgical History:  Procedure Laterality Date  . APPENDECTOMY  1993  . BREAST ENHANCEMENT SURGERY  2002  . COLONOSCOPY  07/2014   Dr. Collene Mares - normal  . DILATATION & CURETTAGE/HYSTEROSCOPY WITH MYOSURE N/A 01/29/2018   Procedure: DILATATION & CURETTAGE/HYSTEROSCOPY WITH MYOSURE POLYPECTOMY;  Surgeon: Charyl Bigger, MD;  Location: [facility];  Service: Gynecology;  Laterality: N/A;  . ESOPHAGOGASTRODUODENOSCOPY  04/11/13   [facility] Dr. Collene Mares  . FOOT SURGERY Bilateral    on toes  . INSERTION CENTRAL VENOUS ACCESS  DEVICE W/ SUBCUTANEOUS PORT  2006  . LYMPH NODE BIOPSY  2006   under right arm  . NM MYOCAR PERF WALL MOTION  12/2010   persantine myoview - moderate breast attenuation (fixed mid-distal anterior defect), EF 68%, low risk scan  . RHINOPLASTY  2007   deviated septum  . TEE WITHOUT CARDIOVERSION  11/2010   Nl LV Fxn, EF 60-65%, mild, holosystolic prolapse of the medial anterior leaflet. Mod MR .   Marland Kitchen TRANSTHORACIC ECHOCARDIOGRAM  3/'14; 3/'15   LVEF 60-65%, wall motion normal, LV function normal, moderate holosystolic MVP (medial the middle scallop of the posterior leaflet), moderate regurgitation (directed posteriorly), no shunt -- stable over 2 years  . TRANSTHORACIC ECHOCARDIOGRAM  10/2015   EF 55-60%. Normal WM. Normal D Fxn. Bilateral MVP w/ Mod MR (very eccentric). Normal LA, RV & RA. Normal PAP.  . WISDOM TOOTH EXTRACTION      MEDICATIONS/ ALLERGIES   Current Meds  Medication Sig  . albuterol (PROVENTIL HFA;VENTOLIN HFA) 108 (90 Base) MCG/ACT inhaler Inhale 2 puffs into the lungs every 6 (six) hours  as needed for wheezing or shortness of breath.  . diltiazem (CARDIZEM CD) 120 MG 24 hr capsule Take 1 capsule (120 mg total) by mouth daily.  . sertraline (ZOLOFT) 100 MG tablet TAKE 1 TABLET (100 MG TOTAL) BY MOUTH EVERY EVENING.  . SYNTHROID 88 MCG tablet Take 1 tablet (88 mcg total) by mouth daily.  . thalidomide (THALOMID) 50 MG capsule Take 50 mg by mouth daily.  . Vitamin D, Ergocalciferol, (DRISDOL) 1.25 MG (50000 UT) CAPS capsule TAKE 1 CAPSULE BY MOUTH ONE TIME PER WEEK    Allergies  Allergen Reactions  . Codeine Itching  . Fish Allergy Itching    Mahi Mahi  . Iohexol Hives  . Pindolol     Avoid non selective beta blockers due to dyspnea  . Iodinated Diagnostic Agents Rash    Has received since rash developed without pre-meds without development of rash.    SOCIAL HISTORY/FAMILY HISTORY   Social History   Tobacco Use  . Smoking status: Former Smoker    Packs/day:  1.00    Years: 10.00    Pack years: 10.00    Types: Cigarettes    Quit date: 11/07/2009    Years since quitting: 9.4  . Smokeless tobacco: Never Used  Substance Use Topics  . Alcohol use: Yes    Alcohol/week: 1.0 - 2.0 standard drinks    Types: 1 - 2 Cans of beer per week    Comment: social Beer/Liquor  . Drug use: No   Social History   Social History Narrative   Married, 0 children, has failed fertility treatment & has given up attempts at IVF as well as adoption. 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  OBJCTIVE   Wt Readings from Last 3 Encounters:  04/23/19 185 lb (83.9 kg)  04/09/19 188 lb (85.3 kg)  03/19/19 188 lb (85.3 kg)   Physical Exam: BP 107/70   Pulse 85   Ht _0  (1.702 m)   Wt 185 lb (83.9 kg)   SpO2 97%   BMI 28.98 kg/m  Physical Exam  Constitutional: She is oriented to person, place, and time. She appears well-developed and well-nourished. No distress.  She has gained consistent amount of weight since I first met her, but still looks healthy.  HENT:  Head: Normocephalic and atraumatic.  Neck: Normal range of motion. Neck supple. No hepatojugular reflux and no JVD present. Carotid bruit is not present.  Cardiovascular: Normal rate, regular rhythm, intact distal pulses and normal pulses.  No extrasystoles are present. PMI is not displaced. Exam reveals no gallop and no friction rub.  Murmur (Unable to hear midsystolic click, but I do hear 3-7/1 HSM at apex) heard. Pulmonary/Chest: Effort normal and breath sounds normal. No  respiratory distress. She has no wheezes. She has no rales.  Abdominal: Soft. Bowel sounds are normal. She exhibits no distension. There is no abdominal tenderness. There is no rebound.  Musculoskeletal: Normal range of motion.        General: No edema (Maybe trivial).  Neurological: She is alert and oriented to person, place,  and time.  Psychiatric: She has a normal mood and affect. Her behavior is normal. Judgment and thought content normal.  Vitals reviewed.    Adult ECG Report Not checked  Recent Labs: Recent labs not available  ASSESSMENT/PLAN   Problem List Items Addressed This Visit    Moderate mitral regurgitation by prior echocardiogram - Primary (Chronic)    Stable moderate MR on echo.  Continue to follow symptoms but otherwise will check follow-up echo in April 2021      Relevant Orders   ECHOCARDIOGRAM COMPLETE   MVP (mitral valve prolapse) (Chronic)    Initially thought to be relatively significant, now has been read as mild bileaflet prolapse.  Current guidelines do not recommend SBP prophylaxis.      Relevant Orders   ECHOCARDIOGRAM COMPLETE   Palpitation (Chronic)    Has been changed to Bystolic long ago, now no longer on beta-blocker.  Did not tolerate metoprolol.  Switch to diltiazem 120 mg last visit -consider titrating further.      Relevant Orders   ECHOCARDIOGRAM COMPLETE   RAD (reactive airway disease) (Chronic)    Ok to use inhalers (Albuterol - may do better with Xopenex).  This was the actual try source of her SOB & chest discomfert.      Generalized anxiety disorder    Seems to have stabilized out with Zoloft.         COVID-19 Education: The signs and symptoms of COVID-19 were discussed with the patient and how to seek care for testing (follow up with PCP or arrange E-visit).   The importance of social distancing was discussed today.  I spent a total of 55mnutes with the patient and chart review. >  50% of the time was spent in direct  patient consultation.  Additional time spent with chart review (studies, outside notes, etc): 6 Total Time: 23 min   Current medicines are reviewed at length with the patient today.  (+/- concerns) none   Patient Instructions / Medication Changes & Studies & Tests Ordered   Patient Instructions  Medication Instructions:  CONTINUE ALL MEDICATIONS  CONTINUE DILTIAZEM  WATCH FOR INCREASE HEART RATE - IF BECOMES MORE FREQUENT CONTACT OFFICE    If you need a refill on your cardiac medications before your next appointment, please call your pharmacy.   Lab work:  NOT NEEDED  Testing/Procedures: WILL BE SCHEDULE IN April 2021-  AT [id number] Your physician has requested that you have an echocardiogram. Echocardiography is a painless test that uses sound waves to create images of your heart. It provides your doctor with information about the size and shape of your heart and how well your heart's chambers and valves are working. This procedure takes approximately one hour. There are no restrictions for this procedure.    Follow-Up: [facility] you and your health needs are our priority.  As part of our continuing mission to provide you with exceptional heart care, we have created designated Provider Care Teams.  These Care Teams include your primary Cardiologist (physician) and Advanced Practice Providers (APPs -  Physician Assistants and Nurse Practitioners) who all work together to provide you with the care you need, when you need it. . You will need a follow up appointment in 12 months  OCT 2021 WITH DR [place]  Please call our office 2 months in advance to schedule this appointment.  You may see DGlenetta Hew MD or one of the following Advanced Practice Providers on your  designated Care Team:   . Rosaria Ferries, PA-C . Jory Sims, DNP, ANP- 6 months April ( VIRTUAL)  Any Other Special Instructions Will Be Listed Below.   Studies Ordered:    Orders Placed This Encounter  Procedures  . ECHOCARDIOGRAM COMPLETE      Glenetta Hew, M.D., M.S. Interventional Cardiologist   Pager # [phone] Phone # [phone] [address] [place], [place] [address]   Thank you for choosing Heartcare at [facility]!!

## 2019-05-02 ENCOUNTER — Other Ambulatory Visit: Payer: Self-pay | Admitting: Medical

## 2019-05-05 ENCOUNTER — Encounter: Payer: Self-pay | Admitting: Medical

## 2019-05-05 ENCOUNTER — Ambulatory Visit (INDEPENDENT_AMBULATORY_CARE_PROVIDER_SITE_OTHER): Payer: BC Managed Care – PPO | Admitting: Medical

## 2019-05-05 ENCOUNTER — Other Ambulatory Visit: Payer: Self-pay

## 2019-05-05 VITALS — BP 140/88 | HR 115 | Temp 98.7°F | Ht 67.0 in | Wt 180.8 lb

## 2019-05-05 DIAGNOSIS — F410 Panic disorder [episodic paroxysmal anxiety] without agoraphobia: Secondary | ICD-10-CM | POA: Diagnosis not present

## 2019-05-05 DIAGNOSIS — F411 Generalized anxiety disorder: Secondary | ICD-10-CM | POA: Diagnosis not present

## 2019-05-05 MED ORDER — SERTRALINE HCL 100 MG PO TABS: 100.0000 mg | ORAL_TABLET | Freq: Every evening | ORAL | 0 refills | Status: DC

## 2019-05-05 MED ORDER — CLONAZEPAM 0.5 MG PO TABS: 0.5000 mg | ORAL_TABLET | Freq: Two times a day (BID) | ORAL | 0 refills | Status: DC | PRN

## 2019-05-05 NOTE — Patient Instructions (Signed)
Ways to deal with stress and anxiety.  I recommend regular exercise such as 30 minutes or more most days of the week such as walking running and bicycling  I recommend taking some time to meditate or pray daily to help slow racing thoughts.  I recommend working on relaxation techniques such as deep breathing exercises in a comfortable position relaxing your body.  There are free Apps on the smart phone for this for example  Consider getting a massage  Journal or use diary to express your ideas on paper to cope with anxiety and stress  Work on time management, use a calendar or plan out things to avoid stressing about things. Find ways to utilize your time to include exercise and personal "me" time.  Some people use aromatherapy such as lavender to relax  Some people use herbal teas to help calm their mood  Spend some time with animals or your pet if you have one  Consider seeing a counselor to help deal with anxiety and work on specific techniques     Anxiety and Panic Attacks Your caregiver has informed you that you are having an anxiety or panic attack. There may be many forms of this. Most of the time these attacks come suddenly and without warning. They come at any time of day, including periods of sleep, and at any time of life. They may be strong and unexplained. Although panic attacks are very scary, they are physically harmless. Sometimes the cause of your anxiety is not known. Anxiety is a protective mechanism of the body in its fight or flight mechanism. Most of these perceived danger situations are actually nonphysical situations (such as anxiety over losing a job). CAUSES  The causes of an anxiety or panic attack are many. Panic attacks may occur in otherwise healthy people given a certain set of circumstances. There may be a genetic cause for panic attacks. Some medications may also have anxiety as a side effect. SYMPTOMS  Some of the most common feelings are:  Intense  terror.   Dizziness, feeling faint.   Hot and cold flashes.   Fear of going crazy.   Feelings that nothing is real.   Sweating.   Shaking.   Chest pain or a fast heartbeat (palpitations).   Smothering, choking sensations.   Feelings of impending doom and that death is near.   Tingling of extremities, this may be from over-breathing.   Altered reality (derealization).   Being detached from yourself (depersonalization).  Several symptoms can be present to make up anxiety or panic attacks. DIAGNOSIS  The evaluation by your caregiver will depend on the type of symptoms you are experiencing. The diagnosis of anxiety or panic attack is made when no physical illness can be determined to be a cause of the symptoms. TREATMENT  Treatment to prevent anxiety and panic attacks may include:  Avoidance of circumstances that cause anxiety.   Reassurance and relaxation.   Regular exercise.   Relaxation therapies, such as yoga.   Psychotherapy with a psychiatrist or therapist.   Avoidance of caffeine, alcohol and illegal drugs.   Prescribed medication.  SEEK IMMEDIATE MEDICAL CARE IF:   You experience panic attack symptoms that are different than your usual symptoms.   You have any worsening or concerning symptoms.  Document Released: 07/03/2005 Document Revised: 03/15/2011 Document Reviewed: 11/04/2009 [facility] Patient Information 2012 [place].     RESOURCES in [place], [place] (NON- psychiatrist offices)  [place] [facility]  [address], [place], [place] [address] [phone]   [facility] [phone] [place], [place], [place] [address]   [facility] [phone]  [email or web address] [address] [place], [place], [place] [address]   Merrianne M. Clarene Reamer, therapist [phone] [address] [place], [place]  [address]   [facility] [phone] [address], [place], [place] [address]   Vic Ripper, therapist [phone] [address], [place],  [address]   [facility] [address] [place], [place],  [address]

## 2019-05-05 NOTE — Progress Notes (Signed)
Subjective: Chief Complaint  Patient presents with  . Anxiety    having a double vasectomy in 30n days    Here for anxiety.  Her husband is in the car today.  She currently continues on Zoloft regularly 100mg  daily, but is having a lot more anxiety of late.  She has ongoing stress related to work.  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  They are training others to help add some support to their staff.  However she has a much bigger stressor coming up in a month.  Early in the year after discussing some health risk with her oncologist and gynecologist, her and her husband made decision to have a double mastectomy as a prophylactic measure given her risk for breast cancer.  Although she has known about this all year with planned surgery, she just now has the realization that things are moving forward and this is about to happen.  They took their time making this decision throughout this year but the fact that everything is coming up now time for the surgery, she is having trouble coping with this reality.  She would like some medicine to help calm her nerves.  She has used medicine in the past to help with worse anxiety.  But it has been a while since she is taken medication like that.  Denies SI, HI.  She and husband are taking steps to get extra staff on board to help deal with work stress while she is out on recovery.  She does note having good support with family that lives her.  And she knows she has beaten other more significant health issues in the past, but lately this is getting to her.  Past Medical History:  Diagnosis Date  . Anxiety   . Asthma    mild intermittent as of 07/2017 - just when patient gets sick  . Depression   . Female infertility   . GERD (gastroesophageal reflux disease)    Hx - no current problems, no meds  . H/O amaurosis fugax Fall 2012   Resolved, no current problems, Hx-MRI of brain/brain stem: no acute abnormality,  small area of encephalomalacia left superior cerebellum that could be prior trauma, infection, or lacunar infarct  . History of Hodgkin's lymphoma 2007   in remission, sees [facility], prior with Dr. Elwanda Brooklyn; prior chemotherapy and radiation therapy; sees yearly in December, no current problems  . Hyperlipidemia 07/2017   diet control, no meds  . Hypothyroidism   . Moderate mitral regurgitation by prior echocardiogram 09/2010   10/2015: EF 55-60%. Normal WM. Normal D Fxn. Bilateral MVP w/ Mod MR (very eccentric). Normal LA, RV & RA. Normal PAP.  Marland Kitchen MVP (mitral valve prolapse) 09/2010   Dr. Ellyn Hack, [facility]; a) Echo 3/'12: Mod MR; EF 60-65%; b) TEE 5/'12: Nl LV Fxn, EF 60-65%, mild, holosystolic prolapse of the medial anterior leaflet. Mod MR . c) Echo 3/'15: Moderate, holosystolic prolapse of  medial. Mod MR d) TM Stress Echo (@ [facility]) Nl Lv Fxn, Mild-Mod MR @ Res0 --> Mod MR at HR 181 bpm (7:22 min, 10.10 METS) w/p WMA  . Oral ulcer   . Palpitations 10/2012   holter monitor, Dr. Ellyn Hack; no arrhythmia, no current problems   Current Outpatient Medications on File Prior to Visit  Medication Sig Dispense Refill  . albuterol (PROVENTIL HFA;VENTOLIN HFA) 108 (90 Base) MCG/ACT inhaler Inhale 2 puffs into the lungs every 6 (six) hours as needed for  wheezing or shortness of breath. 1 Inhaler 2  . diltiazem (CARDIZEM CD) 120 MG 24 hr capsule Take 1 capsule (120 mg total) by mouth daily. 30 capsule 6  . SYNTHROID 88 MCG tablet Take 1 tablet (88 mcg total) by mouth daily. 90 tablet 3  . thalidomide (THALOMID) 50 MG capsule Take 50 mg by mouth daily.    . Vitamin D, Ergocalciferol, (DRISDOL) 1.25 MG (50000 UT) CAPS capsule TAKE 1 CAPSULE BY MOUTH ONE TIME PER WEEK 12 capsule 0   No current facility-administered medications on file prior to visit.    ROS as in subjective   Objective: BP 140/88   Pulse (!) 115   Temp 98.7 F (37.1 C)   Ht 5\' 7"  (1.702 m)   Wt 180 lb 12.8 oz (82 kg)   SpO2 98%   BMI  28.32 kg/m   Gen: anxious, crying, but able to answer questions and discuss her concerns    Assessment: Encounter Diagnoses  Name Primary?  . Generalized anxiety disorder Yes  . Panic attack      Plan: We discussed her concerns.  I strongly recommended she seek out counseling at this time.  I recommended she look into taking some time off from work to help work through her concerns.  They have already taken steps to get other staff on board up to speed.  I will prescribe some short-term clonazepam to help with her symptoms.  Discussed risk and benefits of medication.  Continue Zoloft. We discussed other ways to cope with anxiety.  I asked her to call back within a day or 2 to let me know how things are going   Alcia was seen today for anxiety.  Diagnoses and all orders for this visit:  Generalized anxiety disorder  Panic attack  Other orders -     clonazePAM (KLONOPIN) 0.5 MG tablet; Take 1 tablet (0.5 mg total) by mouth 2 (two) times daily as needed for anxiety. -     sertraline (ZOLOFT) 100 MG tablet; Take 1 tablet (100 mg total) by mouth every evening.

## 2019-05-12 DIAGNOSIS — S63521A Sprain of radiocarpal joint of right wrist, initial encounter: Secondary | ICD-10-CM | POA: Diagnosis not present

## 2019-05-12 DIAGNOSIS — S62634A Displaced fracture of distal phalanx of right ring finger, initial encounter for closed fracture: Secondary | ICD-10-CM | POA: Diagnosis not present

## 2019-05-14 DIAGNOSIS — Z411 Encounter for cosmetic surgery: Secondary | ICD-10-CM | POA: Diagnosis not present

## 2019-05-14 DIAGNOSIS — R928 Other abnormal and inconclusive findings on diagnostic imaging of breast: Secondary | ICD-10-CM | POA: Diagnosis not present

## 2019-05-14 DIAGNOSIS — Z8571 Personal history of Hodgkin lymphoma: Secondary | ICD-10-CM | POA: Diagnosis not present

## 2019-05-19 ENCOUNTER — Other Ambulatory Visit: Payer: Self-pay | Admitting: Medical

## 2019-05-19 NOTE — Telephone Encounter (Signed)
Is this appropriate?  

## 2019-05-29 DIAGNOSIS — Z923 Personal history of irradiation: Secondary | ICD-10-CM | POA: Diagnosis not present

## 2019-05-29 DIAGNOSIS — Z539 Procedure and treatment not carried out, unspecified reason: Secondary | ICD-10-CM | POA: Diagnosis not present

## 2019-05-29 DIAGNOSIS — Z20828 Contact with and (suspected) exposure to other viral communicable diseases: Secondary | ICD-10-CM | POA: Diagnosis not present

## 2019-05-29 DIAGNOSIS — Z8571 Personal history of Hodgkin lymphoma: Secondary | ICD-10-CM | POA: Diagnosis not present

## 2019-05-29 DIAGNOSIS — Z01812 Encounter for preprocedural laboratory examination: Secondary | ICD-10-CM | POA: Diagnosis not present

## 2019-05-29 DIAGNOSIS — Z9189 Other specified personal risk factors, not elsewhere classified: Secondary | ICD-10-CM | POA: Diagnosis not present

## 2019-05-31 ENCOUNTER — Other Ambulatory Visit: Payer: Self-pay | Admitting: Medical

## 2019-06-02 NOTE — Telephone Encounter (Signed)
Please call her as I had been meaning to follow-up with her. I want to make sure the medication has been helping. I wanted to see whether or not she went and saw a counselor by chance? Just checking in on her

## 2019-06-02 NOTE — Telephone Encounter (Signed)
Patient stated she is doing well and the medication has been working for her. She has not seen a counselor but her surgery is coming up soon and she'll have the second surgery in December.

## 2019-07-27 ENCOUNTER — Other Ambulatory Visit: Payer: Self-pay | Admitting: Medical

## 2019-07-30 DIAGNOSIS — Z8571 Personal history of Hodgkin lymphoma: Secondary | ICD-10-CM | POA: Diagnosis not present

## 2019-07-30 DIAGNOSIS — Z923 Personal history of irradiation: Secondary | ICD-10-CM | POA: Diagnosis not present

## 2019-07-30 DIAGNOSIS — Z9189 Other specified personal risk factors, not elsewhere classified: Secondary | ICD-10-CM | POA: Diagnosis not present

## 2019-07-30 DIAGNOSIS — Z9882 Breast implant status: Secondary | ICD-10-CM | POA: Diagnosis not present

## 2019-07-30 DIAGNOSIS — Z1231 Encounter for screening mammogram for malignant neoplasm of breast: Secondary | ICD-10-CM | POA: Diagnosis not present

## 2019-08-07 DIAGNOSIS — N9489 Other specified conditions associated with female genital organs and menstrual cycle: Secondary | ICD-10-CM | POA: Diagnosis not present

## 2019-08-07 DIAGNOSIS — K120 Recurrent oral aphthae: Secondary | ICD-10-CM | POA: Diagnosis not present

## 2019-08-07 DIAGNOSIS — L403 Pustulosis palmaris et plantaris: Secondary | ICD-10-CM | POA: Diagnosis not present

## 2019-08-07 DIAGNOSIS — Z79899 Other long term (current) drug therapy: Secondary | ICD-10-CM | POA: Diagnosis not present

## 2019-08-09 ENCOUNTER — Other Ambulatory Visit: Payer: Self-pay | Admitting: Medical

## 2019-08-11 NOTE — Telephone Encounter (Signed)
[facility] is requesting to fill pt vitamin D Please advise [facility]

## 2019-09-22 DIAGNOSIS — M2241 Chondromalacia patellae, right knee: Secondary | ICD-10-CM | POA: Diagnosis not present

## 2019-09-30 ENCOUNTER — Telehealth: Payer: Self-pay | Admitting: *Deleted

## 2019-09-30 NOTE — Telephone Encounter (Signed)
A message was left, re: her follow up visit. 

## 2019-10-22 ENCOUNTER — Other Ambulatory Visit: Payer: Self-pay

## 2019-10-22 ENCOUNTER — Ambulatory Visit (HOSPITAL_COMMUNITY): Payer: BC Managed Care – PPO | Attending: Cardiology

## 2019-10-22 DIAGNOSIS — I341 Nonrheumatic mitral (valve) prolapse: Secondary | ICD-10-CM | POA: Insufficient documentation

## 2019-10-22 DIAGNOSIS — I340 Nonrheumatic mitral (valve) insufficiency: Secondary | ICD-10-CM | POA: Diagnosis not present

## 2019-10-22 DIAGNOSIS — R002 Palpitations: Secondary | ICD-10-CM | POA: Insufficient documentation

## 2019-10-22 HISTORY — PX: TRANSTHORACIC ECHOCARDIOGRAM: SHX275

## 2019-11-08 ENCOUNTER — Other Ambulatory Visit: Payer: Self-pay | Admitting: Adult Health

## 2019-11-28 DIAGNOSIS — Z923 Personal history of irradiation: Secondary | ICD-10-CM | POA: Diagnosis not present

## 2019-11-28 DIAGNOSIS — Z9189 Other specified personal risk factors, not elsewhere classified: Secondary | ICD-10-CM | POA: Diagnosis not present

## 2019-12-04 DIAGNOSIS — M2062 Acquired deformities of toe(s), unspecified, left foot: Secondary | ICD-10-CM | POA: Diagnosis not present

## 2019-12-08 ENCOUNTER — Encounter: Payer: Self-pay | Admitting: Podiatry

## 2019-12-08 ENCOUNTER — Ambulatory Visit: Payer: BC Managed Care – PPO | Admitting: Podiatry

## 2019-12-08 ENCOUNTER — Ambulatory Visit (INDEPENDENT_AMBULATORY_CARE_PROVIDER_SITE_OTHER): Payer: BC Managed Care – PPO

## 2019-12-08 ENCOUNTER — Other Ambulatory Visit: Payer: Self-pay

## 2019-12-08 DIAGNOSIS — M778 Other enthesopathies, not elsewhere classified: Secondary | ICD-10-CM | POA: Diagnosis not present

## 2019-12-08 DIAGNOSIS — G5792 Unspecified mononeuropathy of left lower limb: Secondary | ICD-10-CM

## 2019-12-08 DIAGNOSIS — M67472 Ganglion, left ankle and foot: Secondary | ICD-10-CM | POA: Diagnosis not present

## 2019-12-08 DIAGNOSIS — C269 Malignant neoplasm of ill-defined sites within the digestive system: Secondary | ICD-10-CM | POA: Insufficient documentation

## 2019-12-08 MED ORDER — METHYLPREDNISOLONE 4 MG PO TBPK
ORAL_TABLET | ORAL | 0 refills | Status: DC
Start: 2019-12-08 — End: 2019-12-08

## 2019-12-08 MED ORDER — METHYLPREDNISOLONE 4 MG PO TBPK
ORAL_TABLET | ORAL | 0 refills | Status: DC
Start: 2019-12-08 — End: 2020-08-16

## 2019-12-08 NOTE — Progress Notes (Signed)
Subjective:  Patient ID: Lisa Waters, female    DOB: 12-Dec-1980,  MRN: [id number] HPI Chief Complaint  Patient presents with  . Foot Pain    Patient presents today for left top of foot pain x 2 weeks. She says its painful to walk and swells by the end of the day.  Sharp shooting pains.  Was seen by [facility] last week and was given a post op shoe.  She has also been taking Aleve and using ice    39 y.o. female presents with the above complaint.   ROS: Denies fever chills nausea vomiting muscle aches pains calf pain back pain chest pain shortness of breath.  Past Medical History:  Diagnosis Date  . Anxiety   . Asthma    mild intermittent as of 07/2017 - just when patient gets sick  . Depression   . Female infertility   . GERD (gastroesophageal reflux disease)    Hx - no current problems, no meds  . H/O amaurosis fugax Fall 2012   Resolved, no current problems, Hx-MRI of brain/brain stem: no acute abnormality, small area of encephalomalacia left superior cerebellum that could be prior trauma, infection, or lacunar infarct  . History of Hodgkin's lymphoma 2007   in remission, sees [facility], prior with Dr. Elwanda Brooklyn; prior chemotherapy and radiation therapy; sees yearly in December, no current problems  . Hyperlipidemia 07/2017   diet control, no meds  . Hypothyroidism   . Moderate mitral regurgitation by prior echocardiogram 09/2010   10/2015: EF 55-60%. Normal WM. Normal D Fxn. Bilateral MVP w/ Mod MR (very eccentric). Normal LA, RV & RA. Normal PAP.  Marland Kitchen MVP (mitral valve prolapse) 09/2010   Dr. Ellyn Hack, [facility]; a) Echo 3/'12: Mod MR; EF 60-65%; b) TEE 5/'12: Nl LV Fxn, EF 60-65%, mild, holosystolic prolapse of the medial anterior leaflet. Mod MR . c) Echo 3/'15: Moderate, holosystolic prolapse of  medial. Mod MR d) TM Stress Echo (@ [facility]) Nl Lv Fxn, Mild-Mod MR @ Res0 --> Mod MR at HR 181 bpm (7:22 min, 10.10 METS) w/p WMA  . Oral ulcer   . Palpitations 10/2012   holter monitor,  Dr. Ellyn Hack; no arrhythmia, no current problems   Past Surgical History:  Procedure Laterality Date  . APPENDECTOMY  1993  . BREAST ENHANCEMENT SURGERY  2002  . COLONOSCOPY  07/2014   Dr. Collene Mares - normal  . DILATATION & CURETTAGE/HYSTEROSCOPY WITH MYOSURE N/A 01/29/2018   Procedure: DILATATION & CURETTAGE/HYSTEROSCOPY WITH MYOSURE POLYPECTOMY;  Surgeon: Charyl Bigger, MD;  Location: [facility];  Service: Gynecology;  Laterality: N/A;  . ESOPHAGOGASTRODUODENOSCOPY  04/11/13   [facility] Dr. Collene Mares  . FOOT SURGERY Bilateral    on toes  . INSERTION CENTRAL VENOUS ACCESS DEVICE W/ SUBCUTANEOUS PORT  2006  . LYMPH NODE BIOPSY  2006   under right arm  . NM MYOCAR PERF WALL MOTION  12/2010   persantine myoview - moderate breast attenuation (fixed mid-distal anterior defect), EF 68%, low risk scan  . RHINOPLASTY  2007   deviated septum  . TEE WITHOUT CARDIOVERSION  11/2010   Nl LV Fxn, EF 60-65%, mild, holosystolic prolapse of the medial anterior leaflet. Mod MR .   Marland Kitchen TRANSTHORACIC ECHOCARDIOGRAM  3/'14; 3/'15   LVEF 60-65%, wall motion normal, LV function normal, moderate holosystolic MVP (medial the middle scallop of the posterior leaflet), moderate regurgitation (directed posteriorly), no shunt -- stable over 2 years  . TRANSTHORACIC ECHOCARDIOGRAM  10/2015   EF 55-60%. Normal  WM. Normal D Fxn. Bilateral MVP w/ Mod MR (very eccentric). Normal LA, RV & RA. Normal PAP.  . WISDOM TOOTH EXTRACTION      Current Outpatient Medications:  Marland Kitchen  Vitamin D, Ergocalciferol, (DRISDOL) 1.25 MG (50000 UNIT) CAPS capsule, TAKE 1 CAPSULE BY MOUTH ONE TIME PER WEEK, Disp: 12 capsule, Rfl: 2 .  methylPREDNISolone (MEDROL DOSEPAK) 4 MG TBPK tablet, 6 day dose pack - take as directed, Disp: 21 tablet, Rfl: 0 .  sertraline (ZOLOFT) 100 MG tablet, TAKE 1 TABLET (100 MG TOTAL) BY MOUTH EVERY EVENING., Disp: 90 tablet, Rfl: 0 .  SYNTHROID 88 MCG tablet, Take 1 tablet (88 mcg total) by mouth daily., Disp:  90 tablet, Rfl: 3 .  thalidomide (THALOMID) 50 MG capsule, Take 50 mg by mouth daily., Disp: , Rfl:   Allergies  Allergen Reactions  . Codeine Itching  . Fish Allergy Itching    Mahi Mahi  . Iohexol Hives  . Pindolol     Avoid non selective beta blockers due to dyspnea  . Iodinated Diagnostic Agents Rash    Has received since rash developed without pre-meds without development of rash.   Review of Systems Objective:  There were no vitals filed for this visit.  General: Well developed, nourished, in no acute distress, alert and oriented x3   Dermatological: Skin is warm, dry and supple bilateral. Nails x 10 are well maintained; remaining integument appears unremarkable at this time. There are no open sores, no preulcerative lesions, no rash or signs of infection present.  Vascular: Dorsalis Pedis artery and Posterior Tibial artery pedal pulses are 2/4 bilateral with immedate capillary fill time. Pedal hair growth present. No varicosities and no lower extremity edema present bilateral.   Neruologic: Grossly intact via light touch bilateral. Vibratory intact via tuning fork bilateral. Protective threshold with Semmes Wienstein monofilament intact to all pedal sites bilateral. Patellar and Achilles deep tendon reflexes 2+ bilateral. No Babinski or clonus noted bilateral.   Musculoskeletal: No gross boney pedal deformities bilateral. No pain, crepitus, or limitation noted with foot and ankle range of motion bilateral. Muscular strength 5/5 in all groups tested bilateral.  She has pain on palpation of the first intermetatarsal space distally there is a small palpable lesion toward the medial aspect of the second met.  This is an area considerable tenderness for her.  Gait: Unassisted, Nonantalgic.    Radiographs:  Radiographs taken today do not demonstrate any type of osseous abnormalities.  Normal osseous structure is noted.  Assessment & Plan:   Assessment: Capsulitis neuritis  possible ganglion or some other type tumor in that area.  Plan: I injected the area today 20 mg Kenalog 5 mg Marcaine and then started on a Medrol Dosepak.  I would like to follow-up with her in about a month to 6 weeks if she is not improved we will do an MRI with contrast.     Lisa Waters T. [place], [place]

## 2019-12-09 DIAGNOSIS — C8190 Hodgkin lymphoma, unspecified, unspecified site: Secondary | ICD-10-CM | POA: Diagnosis not present

## 2019-12-16 ENCOUNTER — Encounter: Payer: Self-pay | Admitting: Podiatry

## 2019-12-18 NOTE — Telephone Encounter (Signed)
I spoke with patient, she stated that the injection and Medrol dose pack did not give her any relief.  She has also been icing and taking Ibuprofen with no relief.  Can we send her in Meloxicam or Diclofenac to see if that helps?  If not, then is it ok to go ahead and order MRI?  Please advise.  Thanks

## 2019-12-18 NOTE — Telephone Encounter (Signed)
Called patient and left message with receptionist to call back,  Voice mail on cell phone full.

## 2019-12-19 ENCOUNTER — Telehealth: Payer: Self-pay

## 2019-12-19 DIAGNOSIS — G5792 Unspecified mononeuropathy of left lower limb: Secondary | ICD-10-CM

## 2019-12-19 DIAGNOSIS — M67472 Ganglion, left ankle and foot: Secondary | ICD-10-CM

## 2019-12-19 NOTE — Telephone Encounter (Signed)
MRI has been approved from 12/19/2019 - 06/15/2020 Auth #  [id number] Patient has been notified and will call to schedule MRI appt to her convenience

## 2019-12-30 ENCOUNTER — Encounter: Payer: Self-pay | Admitting: Podiatry

## 2019-12-30 DIAGNOSIS — M778 Other enthesopathies, not elsewhere classified: Secondary | ICD-10-CM

## 2019-12-30 DIAGNOSIS — G5792 Unspecified mononeuropathy of left lower limb: Secondary | ICD-10-CM

## 2019-12-30 DIAGNOSIS — M67472 Ganglion, left ankle and foot: Secondary | ICD-10-CM

## 2020-01-05 ENCOUNTER — Ambulatory Visit: Payer: BC Managed Care – PPO | Admitting: Podiatry

## 2020-01-07 ENCOUNTER — Other Ambulatory Visit: Payer: Self-pay | Admitting: Medical

## 2020-01-08 ENCOUNTER — Telehealth: Payer: Self-pay | Admitting: *Deleted

## 2020-01-08 NOTE — Telephone Encounter (Signed)
[facility] states pt is scheduled with [place], but is pre-certed for [place], if MRI is to be performed at [place]:  [phone], then the pre-cert location must be changed and she needs to be contacted before 1:00pm today.

## 2020-01-08 NOTE — Telephone Encounter (Signed)
Spoke with Linna Hoff with [employer], location has been changed to [facility] hospital and order ID stays the same   Tillie Rung with preservice center has been notified via voice mail

## 2020-01-12 ENCOUNTER — Ambulatory Visit (HOSPITAL_COMMUNITY)
Admission: RE | Admit: 2020-01-12 | Discharge: 2020-01-12 | Disposition: A | Discharge status: Home / Self Care | Payer: BC Managed Care – PPO | Source: Ambulatory Visit | Attending: Podiatry | Admitting: Podiatry

## 2020-01-12 ENCOUNTER — Other Ambulatory Visit: Payer: Self-pay

## 2020-01-12 DIAGNOSIS — M67472 Ganglion, left ankle and foot: Secondary | ICD-10-CM | POA: Diagnosis not present

## 2020-01-12 DIAGNOSIS — G5792 Unspecified mononeuropathy of left lower limb: Secondary | ICD-10-CM | POA: Diagnosis not present

## 2020-01-12 DIAGNOSIS — S92325A Nondisplaced fracture of second metatarsal bone, left foot, initial encounter for closed fracture: Secondary | ICD-10-CM | POA: Diagnosis not present

## 2020-01-12 MED ORDER — GADOBUTROL 1 MMOL/ML IV SOLN
8.0000 mL | Freq: Once | INTRAVENOUS | Status: AC | PRN
  Administered 2020-01-12: 8 mL via INTRAVENOUS

## 2020-01-14 ENCOUNTER — Encounter: Payer: Self-pay | Admitting: Podiatry

## 2020-01-14 ENCOUNTER — Ambulatory Visit: Payer: BC Managed Care – PPO | Admitting: Podiatry

## 2020-01-15 NOTE — Telephone Encounter (Signed)
I informed pt of Dr. Stephenie Acres review of MRI and orders. Pt states she will pick up the cam boot in [place]. I later called pt to inform, there is no staff in the [place] office today and asked if she would like to pick up the cam boot in the [place] and she stated yes.

## 2020-01-15 NOTE — Telephone Encounter (Signed)
Pt presented to office for cam walker fitting to left foot. I fit pt and instructed on application of the cam walker and told her to wear it at all times except to sleep or shower. Pt states understanding.

## 2020-01-15 NOTE — Telephone Encounter (Signed)
-----   Message from Garrel Ridgel, [place] sent at 01/14/2020  7:24 AM EDT ----- Patient has a crack in the bone of the second metatarsal where her pain is and should be given a a short CAM boot for the next month.  Please dispense boot.

## 2020-01-17 ENCOUNTER — Other Ambulatory Visit: Payer: BC Managed Care – PPO

## 2020-01-28 ENCOUNTER — Encounter: Payer: Self-pay | Admitting: Podiatry

## 2020-01-28 ENCOUNTER — Other Ambulatory Visit: Payer: Self-pay

## 2020-01-28 ENCOUNTER — Ambulatory Visit (INDEPENDENT_AMBULATORY_CARE_PROVIDER_SITE_OTHER): Payer: BC Managed Care – PPO

## 2020-01-28 ENCOUNTER — Ambulatory Visit: Payer: BC Managed Care – PPO | Admitting: Podiatry

## 2020-01-28 DIAGNOSIS — S92325D Nondisplaced fracture of second metatarsal bone, left foot, subsequent encounter for fracture with routine healing: Secondary | ICD-10-CM | POA: Diagnosis not present

## 2020-01-28 DIAGNOSIS — Z9189 Other specified personal risk factors, not elsewhere classified: Secondary | ICD-10-CM | POA: Diagnosis not present

## 2020-01-28 NOTE — Progress Notes (Signed)
She presents today for follow-up of her stress fracture second metatarsal distal diaphysis left. States that it is better in the boot and is doing much better than it was.  Objective: Vital signs are stable she alert oriented x3 she has some tenderness on palpation of the second metatarsophalangeal joint of the left foot. New radiographs taken today definitely demonstrate a osseous bone callus.  Assessment well-healing fracture.  Plan: Follow-up with me in about 1 month

## 2020-02-05 DIAGNOSIS — N926 Irregular menstruation, unspecified: Secondary | ICD-10-CM | POA: Diagnosis not present

## 2020-02-05 DIAGNOSIS — Z6830 Body mass index (BMI) 30.0-30.9, adult: Secondary | ICD-10-CM | POA: Diagnosis not present

## 2020-02-05 DIAGNOSIS — Z1151 Encounter for screening for human papillomavirus (HPV): Secondary | ICD-10-CM | POA: Diagnosis not present

## 2020-02-05 DIAGNOSIS — N920 Excessive and frequent menstruation with regular cycle: Secondary | ICD-10-CM | POA: Diagnosis not present

## 2020-02-05 DIAGNOSIS — Z124 Encounter for screening for malignant neoplasm of cervix: Secondary | ICD-10-CM | POA: Diagnosis not present

## 2020-02-05 DIAGNOSIS — Z01419 Encounter for gynecological examination (general) (routine) without abnormal findings: Secondary | ICD-10-CM | POA: Diagnosis not present

## 2020-02-13 ENCOUNTER — Encounter: Payer: Self-pay | Admitting: Podiatry

## 2020-02-15 HISTORY — PX: MASTECTOMY: SHX3

## 2020-02-20 ENCOUNTER — Other Ambulatory Visit: Payer: Self-pay | Admitting: Medical

## 2020-02-23 ENCOUNTER — Encounter: Payer: Self-pay | Admitting: Family Medicine

## 2020-02-26 ENCOUNTER — Encounter: Payer: Self-pay | Admitting: Family Medicine

## 2020-02-26 ENCOUNTER — Other Ambulatory Visit: Payer: Self-pay

## 2020-02-26 ENCOUNTER — Telehealth (INDEPENDENT_AMBULATORY_CARE_PROVIDER_SITE_OTHER): Payer: BC Managed Care – PPO | Admitting: Family Medicine

## 2020-02-26 VITALS — BP 150/85 | Temp 98.6°F | Ht 67.0 in | Wt 180.0 lb

## 2020-02-26 DIAGNOSIS — E038 Other specified hypothyroidism: Secondary | ICD-10-CM

## 2020-02-26 DIAGNOSIS — F411 Generalized anxiety disorder: Secondary | ICD-10-CM | POA: Diagnosis not present

## 2020-02-26 DIAGNOSIS — Z923 Personal history of irradiation: Secondary | ICD-10-CM

## 2020-02-26 DIAGNOSIS — Z8719 Personal history of other diseases of the digestive system: Secondary | ICD-10-CM

## 2020-02-26 DIAGNOSIS — Z9189 Other specified personal risk factors, not elsewhere classified: Secondary | ICD-10-CM

## 2020-02-26 DIAGNOSIS — Z20822 Contact with and (suspected) exposure to covid-19: Secondary | ICD-10-CM | POA: Diagnosis not present

## 2020-02-26 MED ORDER — SERTRALINE HCL 100 MG PO TABS: 100.0000 mg | ORAL_TABLET | Freq: Every evening | ORAL | 1 refills | Status: DC

## 2020-02-26 MED ORDER — SYNTHROID 88 MCG PO TABS: 88.0000 ug | ORAL_TABLET | Freq: Every day | ORAL | 3 refills | Status: DC

## 2020-02-26 NOTE — Progress Notes (Signed)
   Subjective:    Patient ID: Lisa Waters, female    DOB: [birth year], 39 y.o.   MRN: [id number]  HPI I connected with  Callaway Hardigree on 02/26/20 by a video enabled telemedicine application and verified that I am speaking with the correct person using two identifiers. I discussed the limitations of evaluation and management by telemedicine. The patient expressed understanding and agreed to proceed.  Encounter carried out on caregility.  She is in her office and I am in my office. She is getting ready to have bilateral mastectomy to reduce her risk of cancer due to her previous mantle radiation therapy.  She continues on Synthroid.  Her most recent TSH on 722 was 1.28.  She continues on Zoloft and would like to continue on that medication for the time being.  She is taking thalidomide for treatment of aphthous ulcers which seems to be helping.  She takes weekly dosing of 50,000 units of vitamin D.  Review of Systems     Objective:   Physical Exam Alert and in no distress otherwise not examined       Assessment & Plan:  Generalized anxiety disorder - Plan: sertraline (ZOLOFT) 100 MG tablet  Other specified hypothyroidism - Plan: SYNTHROID 88 MCG tablet  H/O oral aphthous ulcers  History of radiation therapy  Increased risk of breast cancer Recommend she return here in early October for an exam also do vitamin D level and follow-up on other medical concerns.

## 2020-03-04 DIAGNOSIS — D241 Benign neoplasm of right breast: Secondary | ICD-10-CM | POA: Diagnosis not present

## 2020-03-04 DIAGNOSIS — N6481 Ptosis of breast: Secondary | ICD-10-CM | POA: Diagnosis not present

## 2020-03-04 DIAGNOSIS — G8918 Other acute postprocedural pain: Secondary | ICD-10-CM | POA: Diagnosis not present

## 2020-03-04 DIAGNOSIS — Z9189 Other specified personal risk factors, not elsewhere classified: Secondary | ICD-10-CM | POA: Diagnosis not present

## 2020-03-04 DIAGNOSIS — Z4001 Encounter for prophylactic removal of breast: Secondary | ICD-10-CM | POA: Diagnosis not present

## 2020-03-04 DIAGNOSIS — Z8571 Personal history of Hodgkin lymphoma: Secondary | ICD-10-CM | POA: Diagnosis not present

## 2020-03-04 DIAGNOSIS — Z923 Personal history of irradiation: Secondary | ICD-10-CM | POA: Diagnosis not present

## 2020-03-04 DIAGNOSIS — Z9882 Breast implant status: Secondary | ICD-10-CM | POA: Diagnosis not present

## 2020-03-04 DIAGNOSIS — D242 Benign neoplasm of left breast: Secondary | ICD-10-CM | POA: Diagnosis not present

## 2020-03-04 DIAGNOSIS — Z9013 Acquired absence of bilateral breasts and nipples: Secondary | ICD-10-CM | POA: Diagnosis not present

## 2020-03-04 DIAGNOSIS — N6021 Fibroadenosis of right breast: Secondary | ICD-10-CM | POA: Diagnosis not present

## 2020-03-04 DIAGNOSIS — Z421 Encounter for breast reconstruction following mastectomy: Secondary | ICD-10-CM | POA: Diagnosis not present

## 2020-03-04 DIAGNOSIS — N6022 Fibroadenosis of left breast: Secondary | ICD-10-CM | POA: Diagnosis not present

## 2020-03-05 DIAGNOSIS — Z9013 Acquired absence of bilateral breasts and nipples: Secondary | ICD-10-CM | POA: Diagnosis not present

## 2020-03-05 DIAGNOSIS — Z9882 Breast implant status: Secondary | ICD-10-CM | POA: Diagnosis not present

## 2020-03-05 DIAGNOSIS — N6481 Ptosis of breast: Secondary | ICD-10-CM | POA: Diagnosis not present

## 2020-03-05 DIAGNOSIS — D242 Benign neoplasm of left breast: Secondary | ICD-10-CM | POA: Diagnosis not present

## 2020-03-05 DIAGNOSIS — Z8571 Personal history of Hodgkin lymphoma: Secondary | ICD-10-CM | POA: Diagnosis not present

## 2020-03-05 DIAGNOSIS — Z4001 Encounter for prophylactic removal of breast: Secondary | ICD-10-CM | POA: Diagnosis not present

## 2020-03-05 DIAGNOSIS — Z9889 Other specified postprocedural states: Secondary | ICD-10-CM | POA: Diagnosis not present

## 2020-03-05 DIAGNOSIS — Z923 Personal history of irradiation: Secondary | ICD-10-CM | POA: Diagnosis not present

## 2020-03-05 DIAGNOSIS — Z421 Encounter for breast reconstruction following mastectomy: Secondary | ICD-10-CM | POA: Diagnosis not present

## 2020-03-05 DIAGNOSIS — Z9189 Other specified personal risk factors, not elsewhere classified: Secondary | ICD-10-CM | POA: Diagnosis not present

## 2020-03-11 DIAGNOSIS — Z20822 Contact with and (suspected) exposure to covid-19: Secondary | ICD-10-CM | POA: Diagnosis not present

## 2020-03-18 DIAGNOSIS — Z421 Encounter for breast reconstruction following mastectomy: Secondary | ICD-10-CM | POA: Diagnosis not present

## 2020-03-18 DIAGNOSIS — F329 Major depressive disorder, single episode, unspecified: Secondary | ICD-10-CM | POA: Diagnosis not present

## 2020-03-18 DIAGNOSIS — Z853 Personal history of malignant neoplasm of breast: Secondary | ICD-10-CM | POA: Diagnosis not present

## 2020-03-18 DIAGNOSIS — F419 Anxiety disorder, unspecified: Secondary | ICD-10-CM | POA: Diagnosis not present

## 2020-03-18 DIAGNOSIS — Z9013 Acquired absence of bilateral breasts and nipples: Secondary | ICD-10-CM | POA: Diagnosis not present

## 2020-03-18 DIAGNOSIS — I341 Nonrheumatic mitral (valve) prolapse: Secondary | ICD-10-CM | POA: Diagnosis not present

## 2020-03-18 DIAGNOSIS — Z8571 Personal history of Hodgkin lymphoma: Secondary | ICD-10-CM | POA: Diagnosis not present

## 2020-03-18 DIAGNOSIS — N6032 Fibrosclerosis of left breast: Secondary | ICD-10-CM | POA: Diagnosis not present

## 2020-03-29 DIAGNOSIS — Z45811 Encounter for adjustment or removal of right breast implant: Secondary | ICD-10-CM | POA: Diagnosis not present

## 2020-03-29 DIAGNOSIS — Z87891 Personal history of nicotine dependence: Secondary | ICD-10-CM | POA: Diagnosis not present

## 2020-03-29 DIAGNOSIS — Z20822 Contact with and (suspected) exposure to covid-19: Secondary | ICD-10-CM | POA: Diagnosis not present

## 2020-03-29 DIAGNOSIS — K219 Gastro-esophageal reflux disease without esophagitis: Secondary | ICD-10-CM | POA: Diagnosis not present

## 2020-03-29 DIAGNOSIS — N6489 Other specified disorders of breast: Secondary | ICD-10-CM | POA: Diagnosis not present

## 2020-03-29 DIAGNOSIS — Z9013 Acquired absence of bilateral breasts and nipples: Secondary | ICD-10-CM | POA: Diagnosis not present

## 2020-03-29 DIAGNOSIS — E039 Hypothyroidism, unspecified: Secondary | ICD-10-CM | POA: Diagnosis not present

## 2020-03-29 DIAGNOSIS — Z9011 Acquired absence of right breast and nipple: Secondary | ICD-10-CM | POA: Diagnosis not present

## 2020-03-29 DIAGNOSIS — Z9882 Breast implant status: Secondary | ICD-10-CM | POA: Diagnosis not present

## 2020-03-29 DIAGNOSIS — C8190 Hodgkin lymphoma, unspecified, unspecified site: Secondary | ICD-10-CM | POA: Diagnosis not present

## 2020-03-29 DIAGNOSIS — Z006 Encounter for examination for normal comparison and control in clinical research program: Secondary | ICD-10-CM | POA: Diagnosis not present

## 2020-03-29 DIAGNOSIS — Z85831 Personal history of malignant neoplasm of soft tissue: Secondary | ICD-10-CM | POA: Diagnosis not present

## 2020-03-29 DIAGNOSIS — Y838 Other surgical procedures as the cause of abnormal reaction of the patient, or of later complication, without mention of misadventure at the time of the procedure: Secondary | ICD-10-CM | POA: Diagnosis not present

## 2020-03-29 DIAGNOSIS — L7632 Postprocedural hematoma of skin and subcutaneous tissue following other procedure: Secondary | ICD-10-CM | POA: Diagnosis not present

## 2020-04-01 DIAGNOSIS — D259 Leiomyoma of uterus, unspecified: Secondary | ICD-10-CM | POA: Diagnosis not present

## 2020-04-01 DIAGNOSIS — N920 Excessive and frequent menstruation with regular cycle: Secondary | ICD-10-CM | POA: Diagnosis not present

## 2020-04-05 ENCOUNTER — Encounter: Payer: Self-pay | Admitting: Family Medicine

## 2020-04-05 ENCOUNTER — Other Ambulatory Visit: Payer: Self-pay

## 2020-04-05 DIAGNOSIS — E038 Other specified hypothyroidism: Secondary | ICD-10-CM

## 2020-04-05 MED ORDER — SYNTHROID 88 MCG PO TABS: 88.0000 ug | ORAL_TABLET | Freq: Every day | ORAL | 3 refills | Status: DC

## 2020-04-12 DIAGNOSIS — N644 Mastodynia: Secondary | ICD-10-CM | POA: Diagnosis not present

## 2020-04-12 DIAGNOSIS — I509 Heart failure, unspecified: Secondary | ICD-10-CM | POA: Diagnosis not present

## 2020-04-12 DIAGNOSIS — Z9013 Acquired absence of bilateral breasts and nipples: Secondary | ICD-10-CM | POA: Diagnosis not present

## 2020-04-12 DIAGNOSIS — L98499 Non-pressure chronic ulcer of skin of other sites with unspecified severity: Secondary | ICD-10-CM | POA: Diagnosis not present

## 2020-04-12 DIAGNOSIS — Z20822 Contact with and (suspected) exposure to covid-19: Secondary | ICD-10-CM | POA: Diagnosis not present

## 2020-04-12 DIAGNOSIS — E039 Hypothyroidism, unspecified: Secondary | ICD-10-CM | POA: Diagnosis not present

## 2020-04-12 DIAGNOSIS — Z45811 Encounter for adjustment or removal of right breast implant: Secondary | ICD-10-CM | POA: Diagnosis not present

## 2020-04-12 DIAGNOSIS — R000 Tachycardia, unspecified: Secondary | ICD-10-CM | POA: Diagnosis not present

## 2020-04-12 DIAGNOSIS — Y838 Other surgical procedures as the cause of abnormal reaction of the patient, or of later complication, without mention of misadventure at the time of the procedure: Secondary | ICD-10-CM | POA: Diagnosis not present

## 2020-04-12 DIAGNOSIS — Z87891 Personal history of nicotine dependence: Secondary | ICD-10-CM | POA: Diagnosis not present

## 2020-04-12 DIAGNOSIS — Z85118 Personal history of other malignant neoplasm of bronchus and lung: Secondary | ICD-10-CM | POA: Diagnosis not present

## 2020-04-12 DIAGNOSIS — Z8571 Personal history of Hodgkin lymphoma: Secondary | ICD-10-CM | POA: Diagnosis not present

## 2020-04-12 DIAGNOSIS — T8579XA Infection and inflammatory reaction due to other internal prosthetic devices, implants and grafts, initial encounter: Secondary | ICD-10-CM | POA: Diagnosis not present

## 2020-04-12 DIAGNOSIS — N61 Mastitis without abscess: Secondary | ICD-10-CM | POA: Diagnosis not present

## 2020-04-15 ENCOUNTER — Encounter: Payer: Self-pay | Admitting: Family Medicine

## 2020-04-26 ENCOUNTER — Other Ambulatory Visit: Payer: Self-pay

## 2020-04-26 ENCOUNTER — Ambulatory Visit (INDEPENDENT_AMBULATORY_CARE_PROVIDER_SITE_OTHER): Payer: BC Managed Care – PPO | Admitting: Cardiology

## 2020-04-26 VITALS — BP 150/100 | HR 92 | Ht 67.0 in | Wt 190.8 lb

## 2020-04-26 DIAGNOSIS — I340 Nonrheumatic mitral (valve) insufficiency: Secondary | ICD-10-CM | POA: Diagnosis not present

## 2020-04-26 DIAGNOSIS — I341 Nonrheumatic mitral (valve) prolapse: Secondary | ICD-10-CM

## 2020-04-26 DIAGNOSIS — I7300 Raynaud's syndrome without gangrene: Secondary | ICD-10-CM

## 2020-04-26 DIAGNOSIS — R000 Tachycardia, unspecified: Secondary | ICD-10-CM | POA: Diagnosis not present

## 2020-04-26 DIAGNOSIS — R002 Palpitations: Secondary | ICD-10-CM

## 2020-04-26 DIAGNOSIS — J4530 Mild persistent asthma, uncomplicated: Secondary | ICD-10-CM

## 2020-04-26 MED ORDER — DILTIAZEM HCL ER 120 MG PO CP24: 120.0000 mg | ORAL_CAPSULE | Freq: Every day | ORAL | 3 refills | Status: DC

## 2020-04-26 NOTE — Patient Instructions (Addendum)
Medication Instructions:     Start taking diltiazem XT 120 mg  At bedtime  *If you need a refill on your cardiac medications before your next appointment, please call your pharmacy*   Lab Work: Not needed ults.   Testing/Procedures: Not needed   Follow-Up: At [facility], you and your health needs are our priority.  As part of our continuing mission to provide you with exceptional heart care, we have created designated Provider Care Teams.  These Care Teams include your primary Cardiologist (physician) and Advanced Practice Providers (APPs -  Physician Assistants and Nurse Practitioners) who all work together to provide you with the care you need, when you need it.      Your next appointment:   6 month(s)  The format for your next appointment:   In Person  Provider:   Glenetta Hew, MD

## 2020-04-26 NOTE — Progress Notes (Signed)
Primary Care Provider: Denita Lung, MD Cardiologist: Glenetta Hew, MD Electrophysiologist: None  Clinic Note: Chief Complaint  Patient presents with   Follow-up    1 year, also postop/post hospital   Cardiac Valve Problem    Mitral valve regurgitation with mitral prolapse.  Symptoms actually seems stable.   Hypertension    Blood pressure is up quite a bit.   Tachycardia    Fast heart rate episodes.    HPI:    Lisa Waters is a 39 y.o. female with a PMH notable for MODERATE MITRAL REGURGITATION  & MVP-as well as Hodgkin's lymphoma (treated with XRT) as well as history of who presents today for annual as well as hospital follow-up.  Lisa Waters was last seen on April 23, 2019.  She was doing pretty well.  She just had a Zio patch which showed no major arrhythmias minimal PACs and PVCs with no SVT or PAT.  Just heart rates getting up to the 170s.  She was actually feeling better on diltiazem compared to beta-blocker.  She notes her energy was improved not feeling lethargic.  Was active easily doing 8 METS worth of activity without difficulty.  I did recommend ordering an echocardiogram to follow-up for mitral valve.  No heart failure symptoms noted.  Also she is very happy that her anxiety symptoms and depression seem to have stabilized out on her Zoloft.  In the interim since I saw her, she has been evaluated for breast cancer screening.  Ended up having a cardiac MRI back in February that revealed a left-sided mass.  She had been considering prophylactic mastectomy due to increased risk of breast cancer due to radiation therapy.  This was scheduled in November 2020, but canceled because of denial.  This was challenged and overturned following a breast MRI on May 14 that was actually normal..  Surgery was rescheduled for August 19.=> To plan for bilateral nipple sparing mastectomy with reconstruction.  Recent Hospitalizations: [facility]  03/05/2018 -bilateral nipple sparing  mastectomy with reconstruction by plastic surgery.  She was monitored overnight and discharged the following day.  03/18/2020: Tissue expander placement for breast reconstruction  03/30/2020: she had read to do exploratory surgery for evacuation of hematoma from the right breast with removal of right breast implant.   9/27/2021Martin Majestic to the ER breast pain  04/12/2020: I&D/revision  Reviewed  CV studies:    The following studies were reviewed today: (if available, images/films reviewed: From Epic Chart or Care Everywhere)  TTE (10/22/2019): EF 55 to 60%.  Mild LVH.  GRII DD.  Elevated LAP.  Mild LA dilation.  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  Normal aortic valve.  (Stable)  Interval History:   Lisa Waters returns here today actually doing quite well overall from a cardiac standpoint considering everything that she is going through.  She seems quite fatigued and worn out multiple different surgeries and is hoping that this final surgery where she had had drainage tubes placed within that episode and that she can then move forward toward complete breast reduction.  Thankfully, through all this really when she been noticing some high heart rates and high blood pressures because of pain more so than anything else.  Somewhere along the line her diltiazem was discontinued.  She tolerated her surgeries without any issues has not any breathing issues.  A little exertional dyspnea, but she has been somewhat deconditioned now.  Otherwise no chest pain or pressure with rest or exertion.  No PND orthopnea or  edema.  The only discomfort she has in her chest is postop discomfort and not exacerbated by exertion.  She is relatively healthy without any significant weight loss or change.  No untoward heart failure symptoms.  CV Review of Symptoms (Summary) positive for - palpitations, rapid heart rate and Normal postop chest pain and some mild dyspnea.   negative for - dyspnea on exertion, edema, orthopnea, paroxysmal  nocturnal dyspnea or shortness of breath; syncope/near syncope or TIA/amaurosis fugax, claudication  The patient does not have symptoms concerning for COVID-19 infection (fever, chills, cough, or new shortness of breath).   REVIEWED OF SYSTEMS   Review of Systems  Constitutional: Negative for malaise/fatigue (Energy actually is getting better.  She is finally recovering from her surgeries.) and weight loss.  Respiratory: Negative for cough and shortness of breath.   Cardiovascular: Positive for chest pain (Lots of postop pain).  Gastrointestinal: Negative for blood in stool, heartburn and melena.  Genitourinary: Negative for hematuria.  Musculoskeletal: Negative for falls, joint pain and myalgias.  Neurological: Negative for dizziness (Off and on, but also getting better the further out she gets from surgery.), focal weakness and weakness.  Psychiatric/Behavioral: Negative for depression (Seems to be in decent spirits despite her multiple hospitalizations.  Feels better now.) and memory loss. The patient is nervous/anxious. The patient does not have insomnia.        Overall, this seems to have totally stabilized out and she is on a stable dose of sertraline.    I have reviewed and (if needed) personally updated the patient's problem list, medications, allergies, past medical and surgical history, social and family history.   PAST MEDICAL HISTORY   Past Medical History:  Diagnosis Date   Anxiety    Asthma    mild intermittent as of 07/2017 - just when patient gets sick   Depression    Female infertility    GERD (gastroesophageal reflux disease)    Hx - no current problems, no meds   H/O amaurosis fugax Fall 2012   Resolved, no current problems, Hx-MRI of brain/brain stem: no acute abnormality, small area of encephalomalacia left superior cerebellum that could be prior trauma, infection, or lacunar infarct   History of Hodgkin's lymphoma 2007   in remission, sees [facility], prior with  Dr. Elwanda [place]; prior chemotherapy and radiation therapy; sees yearly in December, no current problems   Hyperlipidemia 07/2017   diet control, no meds   Hypothyroidism    Moderate mitral regurgitation by prior echocardiogram 09/2010   10/2015: EF 55-60%. Normal WM. Normal D Fxn. Bilateral MVP w/ Mod MR (very eccentric). Normal LA, RV & RA. Normal PAP.   MVP (mitral valve prolapse) 09/2010   Dr. Ellyn Hack, [facility]; a) Echo 3/'12: Mod MR; EF 60-65%; b) TEE 5/'12: Nl LV Fxn, EF 60-65%, mild, holosystolic prolapse of the medial anterior leaflet. Mod MR . c) Echo 3/'15: Moderate, holosystolic prolapse of  medial. Mod MR d) TM Stress Echo (@ [place]) Nl Lv Fxn, Mild-Mod MR @ Res0 --> Mod MR at HR 181 bpm (7:22 min, 10.10 METS) w/p WMA   Oral ulcer    Palpitations 10/2012   holter monitor, Dr. Ellyn Hack; no arrhythmia, no current problems    PAST SURGICAL HISTORY   Past Surgical History:  Procedure Laterality Date   [place] SURGERY  2002   COLONOSCOPY  07/2014   Dr. Collene Mares - normal   DILATATION & CURETTAGE/HYSTEROSCOPY WITH MYOSURE N/A 01/29/2018   Procedure: DILATATION &  CURETTAGE/HYSTEROSCOPY WITH MYOSURE POLYPECTOMY;  Surgeon: Charyl Bigger, MD;  Location: [facility];  Service: Gynecology;  Laterality: N/A;   ESOPHAGOGASTRODUODENOSCOPY  04/11/13   [facility] Dr. Collene Mares   FOOT SURGERY Bilateral    on toes   INSERTION CENTRAL VENOUS ACCESS DEVICE W/ SUBCUTANEOUS PORT  2006   LYMPH NODE BIOPSY  2006   under right arm   NM MYOCAR PERF WALL MOTION  12/2010   persantine myoview - moderate breast attenuation (fixed mid-distal anterior defect), EF 68%, low risk scan   RHINOPLASTY  2007   deviated septum   TEE WITHOUT CARDIOVERSION  11/2010   Nl LV Fxn, EF 60-65%, mild, holosystolic prolapse of the medial anterior leaflet. Mod MR .    TRANSTHORACIC ECHOCARDIOGRAM  3/'14; 3/'15   LVEF 60-65%, wall motion normal, LV function normal, moderate  holosystolic MVP (medial the middle scallop of the posterior leaflet), moderate regurgitation (directed posteriorly), no shunt -- stable over 2 years   TRANSTHORACIC ECHOCARDIOGRAM  10/2015   EF 55-60%. Normal WM. Normal D Fxn. Bilateral MVP w/ Mod MR (very eccentric). Normal LA, RV & RA. Normal PAP.   WISDOM TOOTH EXTRACTION       2D Echo April 2019: EF 55 to 60%.  No R WMA.  Mild bileaflet prolapse.  Moderate MR with normal pulmonary vein flow.  (Stable froReviewed  CV studies: m 2017).  ZIO Patch monitor September 2020: Minimum heart rate 70 bpm.  Maximum 171 bpm with an average of 96 bpm.  Otherwise normal monitor.  No arrhythmias.  No SVT or PAT.  Minimal/rare PVCs/PACs.  Immunization History  Administered Date(s) Administered   Influenza Split 05/20/2012   Influenza,inj,Quad PF,6+ Mos 04/07/2013   PPD Test 03/04/2013   Tdap 04/01/2015    MEDICATIONS/ALLERGIES   Current Meds  Medication Sig   sertraline (ZOLOFT) 100 MG tablet Take 1 tablet (100 mg total) by mouth every evening.   SYNTHROID 88 MCG tablet Take 1 tablet (88 mcg total) by mouth daily.   thalidomide (THALOMID) 50 MG capsule Take 50 mg by mouth daily.   Vitamin D, Ergocalciferol, (DRISDOL) 1.25 MG (50000 UNIT) CAPS capsule TAKE 1 CAPSULE BY MOUTH ONE TIME PER WEEK    Allergies  Allergen Reactions   Codeine Itching   Fish Allergy Itching    Mahi Mahi   Iohexol Hives   Pindolol     Avoid non selective beta blockers due to dyspnea   Iodinated Diagnostic Agents Rash    Has received since rash developed without pre-meds without development of rash.    SOCIAL HISTORY/FAMILY HISTORY   Reviewed in Epic:  Pertinent findings: No new changes.  OBJCTIVE -PE, EKG, labs   Wt Readings from Last 3 Encounters:  04/28/20 187 lb (84.8 kg)  04/26/20 190 lb 12.8 oz (86.5 kg)  02/26/20 180 lb (81.6 kg)    Physical Exam: BP (!) 150/100    Pulse 92    Ht 5\' 7"  (1.702 m)    Wt 190 lb 12.8 oz (86.5 kg)     SpO2 96%    BMI 29.88 kg/m  Physical Exam Vitals reviewed.  Constitutional:      General: She is not in acute distress.    Appearance: Normal appearance. She is not ill-appearing or toxic-appearing.     Comments: Is now most borderline obese.  But seems relatively healthy for someone who is just had surgery with multiple complications.  HENT:     Head: Normocephalic and atraumatic.  Neck:  Vascular: No carotid bruit or JVD.  Cardiovascular:     Rate and Rhythm: Normal rate and regular rhythm. Occasional extrasystoles are present.    Chest Wall: PMI is not displaced.     Pulses: Normal pulses.     Heart sounds: Murmur heard. High-pitched blowing holosystolic murmur is present with a grade of 2/6 at the apex.  No friction rub. No gallop.   Pulmonary:     Effort: Pulmonary effort is normal. No respiratory distress.     Breath sounds: Normal breath sounds.  Chest:     Chest wall: Tenderness (Postop tenderness on both breasts.) present.  Musculoskeletal:        General: No swelling. Normal range of motion.     Cervical back: Normal range of motion.  Neurological:     General: No focal deficit present.     Mental Status: She is alert and oriented to person, place, and time.  Psychiatric:        Mood and Affect: Mood normal.        Behavior: Behavior normal.        Thought Content: Thought content normal.        Judgment: Judgment normal.      Adult ECG Report  Rate: 92;  Rhythm: normal sinus rhythm and Otherwise normal axis, intervals and durations.;   Narrative Interpretation: Stable EKG.  Recent Labs: I do not have access labs since 2019 Lab Results  Component Value Date   CHOL 285 (H) 07/26/2017   HDL 116 07/26/2017   LDLCALC 154 (H) 07/26/2017   TRIG 59 07/26/2017   CHOLHDL 2.5 07/26/2017   Lab Results  Component Value Date   CREATININE 0.62 02/26/2019   BUN 9 02/26/2019   NA 135 02/26/2019   K 4.4 02/26/2019   CL 92 (L) 02/26/2019   CO2 22 02/26/2019    Lab Results  Component Value Date   TSH 7.420 (H) 02/26/2019    ASSESSMENT/PLAN    Problem List Items Addressed This Visit    Moderate mitral regurgitation by prior echocardiogram (Chronic)    Continues to be stable.  Was last checked in April of this year.  We can probably wait at least 2 years to reassess.  Obviously if heart failure symptoms were to recur, we would need to recheck.      Relevant Medications   diltiazem (DILACOR XR) 120 MG 24 hr capsule   Other Relevant Orders   EKG 12-Lead (Completed)   MVP (mitral valve prolapse) (Chronic)    Prolapse seems to be better on recurrent echoes.  I would suspect that it may get worse if blood pressure is increased, therefore maintaining stable blood pressure is totally necessary.      Relevant Medications   diltiazem (DILACOR XR) 120 MG 24 hr capsule   Palpitation (Chronic)    She still has episodes of palpitations but they seem to be doing pretty well.  She has not had significant symptoms in a while.  However she is no longer on diltiazem.  We had switched her from beta-blocker and she did well with it.  Somehow postop would not restart it.  Her blood pressure is quite high so we can clearly treat.  I think pressures are high because of pain but we do have room to put her back on low-dose calcium blocker.  Plan start low-dose diltiazem 120 mg XT.      Relevant Orders   EKG 12-Lead (Completed)   RAD (reactive airway disease) (  Chronic)    Trying to avoid beta-blockers because of this.  Therefore we will restart diltiazem.      Raynauds disease    She does have spasm and tachycardia.  She is supposed be on diltiazem which I will restart at 120 mg daily.      Relevant Medications   diltiazem (DILACOR XR) 120 MG 24 hr capsule   Tachycardia - Primary    She does have a pretty high resting heart rate.  Needs to be back on her diltiazem.  We will restart 120 mg daily.      Relevant Orders   EKG 12-Lead (Completed)       COVID-19 Education: The signs and symptoms of COVID-19 were discussed with the patient and how to seek care for testing (follow up with PCP or arrange E-visit).   The importance of social distancing and COVID-19 vaccination was discussed today.  The patient is practicing social distancing & Masking.   I spent a total of 29 minutes with the patient spent in direct patient consultation.  Additional time spent with chart review  / charting (studies, outside notes, etc): 8 Total Time: 37 min   Current medicines are reviewed at length with the patient today.  (+/- concerns) n/a  This visit occurred during the SARS-CoV-2 public health emergency.  Safety protocols were in place, including screening questions prior to the visit, additional usage of staff PPE, and extensive cleaning of exam room while observing appropriate contact time as indicated for disinfecting solutions.  Notice: This dictation was prepared with Dragon dictation along with smaller phrase technology. Any transcriptional errors that result from this process are unintentional and may not be corrected upon review.  Patient Instructions / Medication Changes & Studies & Tests Ordered   Patient Instructions  Medication Instructions:     Start taking diltiazem XT 120 mg  At bedtime  *If you need a refill on your cardiac medications before your next appointment, please call your pharmacy*   Lab Work: Not needed ults.   Testing/Procedures: Not needed   Follow-Up: At [facility], you and your health needs are our priority.  As part of our continuing mission to provide you with exceptional heart care, we have created designated Provider Care Teams.  These Care Teams include your primary Cardiologist (physician) and Advanced Practice Providers (APPs -  Physician Assistants and Nurse Practitioners) who all work together to provide you with the care you need, when you need it.      Your next appointment:   6  month(s)  The format for your next appointment:   In Person  Provider:   Glenetta Hew, MD    Plans to see her back in 6 months in order to reassess her when she has not having pain and issues with her recent surgery.  Studies Ordered:   Orders Placed This Encounter  Procedures   EKG 12-Lead     Glenetta Hew, M.D., M.S. Interventional Cardiologist   Pager # [phone] Phone # [phone] [address] [place], [place] [address]   Thank you for choosing Heartcare at [facility]!!

## 2020-04-27 ENCOUNTER — Encounter: Payer: Self-pay | Admitting: Family Medicine

## 2020-04-27 NOTE — Telephone Encounter (Signed)
Called pharmacy and they state that diltiazem was filled 8-2 #90 so they put the rx on hold until she is due for refill.  Tried to call pt cell went directly to VM-left message to call to discuss. Tried to call wk #-pt is not working today.  Email sent

## 2020-04-28 ENCOUNTER — Telehealth (INDEPENDENT_AMBULATORY_CARE_PROVIDER_SITE_OTHER): Payer: BC Managed Care – PPO | Admitting: Family Medicine

## 2020-04-28 ENCOUNTER — Other Ambulatory Visit: Payer: Self-pay

## 2020-04-28 ENCOUNTER — Encounter: Payer: Self-pay | Admitting: Family Medicine

## 2020-04-28 VITALS — BP 150/100 | Temp 98.6°F | Wt 187.0 lb

## 2020-04-28 DIAGNOSIS — Z923 Personal history of irradiation: Secondary | ICD-10-CM | POA: Diagnosis not present

## 2020-04-28 DIAGNOSIS — Z9013 Acquired absence of bilateral breasts and nipples: Secondary | ICD-10-CM

## 2020-04-28 DIAGNOSIS — F411 Generalized anxiety disorder: Secondary | ICD-10-CM

## 2020-04-28 MED ORDER — ALPRAZOLAM 0.25 MG PO TABS: 0.2500 mg | ORAL_TABLET | Freq: Two times a day (BID) | ORAL | 0 refills | Status: DC | PRN

## 2020-04-28 NOTE — Progress Notes (Signed)
   Subjective:    Patient ID: Lisa Waters, female    DOB: [birth year], 39 y.o.   MRN: [id number]  HPI I connected with  Bell Carbo on 04/28/20 by a video enabled telemedicine application and verified that I am speaking with the correct person using two identifiers.  Caregility used I am in my office and she was at home. I discussed the limitations of evaluation and management by telemedicine. The patient expressed understanding and agreed to proceed. She has had bilateral mastectomy because of increased risk of breast cancer.  She also has a previous history of Hodgkin's lymphoma.  The left breast reconstruction is going quite well however she has had a great deal of difficulty with the right breast including having an infection.  This has her quite distraught.  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  She does have a good support system.  She cannot stay focused, is crying and feels quite anxious.   Review of Systems     Objective:   Physical Exam  Alert and in no distress but slightly tearful.     Assessment & Plan:  Generalized anxiety disorder - Plan: ALPRAZolam (XANAX) 0.25 MG tablet  History of radiation therapy  H/O bilateral mastectomy I will give her Xanax to help with this.  She will keep in touch with me on whether it works and, how long.  Hopefully this will help quiet things down until the problems she is having with her breast reconstruction is finished.

## 2020-05-04 ENCOUNTER — Encounter: Payer: Self-pay | Admitting: Family Medicine

## 2020-05-05 ENCOUNTER — Encounter: Payer: Self-pay | Admitting: Family Medicine

## 2020-05-05 DIAGNOSIS — Z9012 Acquired absence of left breast and nipple: Secondary | ICD-10-CM | POA: Diagnosis not present

## 2020-05-05 MED ORDER — LORAZEPAM 0.5 MG PO TABS: 0.5000 mg | ORAL_TABLET | Freq: Two times a day (BID) | ORAL | 1 refills | Status: DC | PRN

## 2020-05-06 ENCOUNTER — Encounter: Payer: Self-pay | Admitting: Cardiology

## 2020-05-06 NOTE — Assessment & Plan Note (Signed)
Continues to be stable.  Was last checked in April of this year.  We can probably wait at least 2 years to reassess.  Obviously if heart failure symptoms were to recur, we would need to recheck.

## 2020-05-06 NOTE — Assessment & Plan Note (Signed)
She still has episodes of palpitations but they seem to be doing pretty well.  She has not had significant symptoms in a while.  However she is no longer on diltiazem.  We had switched her from beta-blocker and she did well with it.  Somehow postop would not restart it.  Her blood pressure is quite high so we can clearly treat.  I think pressures are high because of pain but we do have room to put her back on low-dose calcium blocker.  Plan start low-dose diltiazem 120 mg XT.

## 2020-05-06 NOTE — Assessment & Plan Note (Signed)
Prolapse seems to be better on recurrent echoes.  I would suspect that it may get worse if blood pressure is increased, therefore maintaining stable blood pressure is totally necessary.

## 2020-05-06 NOTE — Assessment & Plan Note (Signed)
She does have spasm and tachycardia.  She is supposed be on diltiazem which I will restart at 120 mg daily.

## 2020-05-06 NOTE — Assessment & Plan Note (Signed)
She does have a pretty high resting heart rate.  Needs to be back on her diltiazem.  We will restart 120 mg daily.

## 2020-05-06 NOTE — Assessment & Plan Note (Signed)
Trying to avoid beta-blockers because of this.  Therefore we will restart diltiazem.

## 2020-05-09 ENCOUNTER — Other Ambulatory Visit: Payer: Self-pay | Admitting: Medical

## 2020-05-14 ENCOUNTER — Encounter: Payer: Self-pay | Admitting: Family Medicine

## 2020-05-17 ENCOUNTER — Encounter: Payer: Self-pay | Admitting: Family Medicine

## 2020-05-25 ENCOUNTER — Other Ambulatory Visit: Payer: Self-pay | Admitting: Medical

## 2020-05-25 NOTE — Telephone Encounter (Signed)
Please advise of vitamin d . [place]

## 2020-06-09 ENCOUNTER — Telehealth: Payer: BC Managed Care – PPO | Admitting: Nurse Practitioner

## 2020-06-09 DIAGNOSIS — R059 Cough, unspecified: Secondary | ICD-10-CM | POA: Diagnosis not present

## 2020-06-09 NOTE — Progress Notes (Signed)

## 2020-06-16 DIAGNOSIS — Z9189 Other specified personal risk factors, not elsewhere classified: Secondary | ICD-10-CM | POA: Diagnosis not present

## 2020-06-16 DIAGNOSIS — Z9013 Acquired absence of bilateral breasts and nipples: Secondary | ICD-10-CM | POA: Diagnosis not present

## 2020-06-30 DIAGNOSIS — Z923 Personal history of irradiation: Secondary | ICD-10-CM | POA: Diagnosis not present

## 2020-06-30 DIAGNOSIS — I7300 Raynaud's syndrome without gangrene: Secondary | ICD-10-CM | POA: Diagnosis not present

## 2020-06-30 DIAGNOSIS — Z9221 Personal history of antineoplastic chemotherapy: Secondary | ICD-10-CM | POA: Diagnosis not present

## 2020-06-30 DIAGNOSIS — Z20822 Contact with and (suspected) exposure to covid-19: Secondary | ICD-10-CM | POA: Diagnosis not present

## 2020-06-30 DIAGNOSIS — Z1501 Genetic susceptibility to malignant neoplasm of breast: Secondary | ICD-10-CM | POA: Diagnosis not present

## 2020-06-30 DIAGNOSIS — Z8571 Personal history of Hodgkin lymphoma: Secondary | ICD-10-CM | POA: Diagnosis not present

## 2020-06-30 DIAGNOSIS — F32A Depression, unspecified: Secondary | ICD-10-CM | POA: Diagnosis not present

## 2020-06-30 DIAGNOSIS — J45909 Unspecified asthma, uncomplicated: Secondary | ICD-10-CM | POA: Diagnosis not present

## 2020-06-30 DIAGNOSIS — K219 Gastro-esophageal reflux disease without esophagitis: Secondary | ICD-10-CM | POA: Diagnosis not present

## 2020-06-30 DIAGNOSIS — Z87891 Personal history of nicotine dependence: Secondary | ICD-10-CM | POA: Diagnosis not present

## 2020-06-30 DIAGNOSIS — E039 Hypothyroidism, unspecified: Secondary | ICD-10-CM | POA: Diagnosis not present

## 2020-06-30 DIAGNOSIS — Z79899 Other long term (current) drug therapy: Secondary | ICD-10-CM | POA: Diagnosis not present

## 2020-06-30 DIAGNOSIS — F419 Anxiety disorder, unspecified: Secondary | ICD-10-CM | POA: Diagnosis not present

## 2020-06-30 DIAGNOSIS — Z421 Encounter for breast reconstruction following mastectomy: Secondary | ICD-10-CM | POA: Diagnosis not present

## 2020-06-30 DIAGNOSIS — Z9013 Acquired absence of bilateral breasts and nipples: Secondary | ICD-10-CM | POA: Diagnosis not present

## 2020-06-30 DIAGNOSIS — I209 Angina pectoris, unspecified: Secondary | ICD-10-CM | POA: Diagnosis not present

## 2020-06-30 DIAGNOSIS — Z01812 Encounter for preprocedural laboratory examination: Secondary | ICD-10-CM | POA: Diagnosis not present

## 2020-06-30 DIAGNOSIS — I340 Nonrheumatic mitral (valve) insufficiency: Secondary | ICD-10-CM | POA: Diagnosis not present

## 2020-07-01 DIAGNOSIS — K219 Gastro-esophageal reflux disease without esophagitis: Secondary | ICD-10-CM | POA: Diagnosis not present

## 2020-07-01 DIAGNOSIS — R000 Tachycardia, unspecified: Secondary | ICD-10-CM | POA: Diagnosis not present

## 2020-07-01 DIAGNOSIS — Z8571 Personal history of Hodgkin lymphoma: Secondary | ICD-10-CM | POA: Diagnosis not present

## 2020-07-01 DIAGNOSIS — Z923 Personal history of irradiation: Secondary | ICD-10-CM | POA: Diagnosis not present

## 2020-07-01 DIAGNOSIS — F32A Depression, unspecified: Secondary | ICD-10-CM | POA: Diagnosis not present

## 2020-07-01 DIAGNOSIS — I209 Angina pectoris, unspecified: Secondary | ICD-10-CM | POA: Diagnosis not present

## 2020-07-01 DIAGNOSIS — I340 Nonrheumatic mitral (valve) insufficiency: Secondary | ICD-10-CM | POA: Diagnosis not present

## 2020-07-01 DIAGNOSIS — E039 Hypothyroidism, unspecified: Secondary | ICD-10-CM | POA: Diagnosis not present

## 2020-07-01 DIAGNOSIS — Z79899 Other long term (current) drug therapy: Secondary | ICD-10-CM | POA: Diagnosis not present

## 2020-07-01 DIAGNOSIS — Z421 Encounter for breast reconstruction following mastectomy: Secondary | ICD-10-CM | POA: Diagnosis not present

## 2020-07-01 DIAGNOSIS — Z20822 Contact with and (suspected) exposure to covid-19: Secondary | ICD-10-CM | POA: Diagnosis not present

## 2020-07-01 DIAGNOSIS — F419 Anxiety disorder, unspecified: Secondary | ICD-10-CM | POA: Diagnosis not present

## 2020-07-01 DIAGNOSIS — Z9221 Personal history of antineoplastic chemotherapy: Secondary | ICD-10-CM | POA: Diagnosis not present

## 2020-07-01 DIAGNOSIS — Z9013 Acquired absence of bilateral breasts and nipples: Secondary | ICD-10-CM | POA: Diagnosis not present

## 2020-07-01 DIAGNOSIS — I7300 Raynaud's syndrome without gangrene: Secondary | ICD-10-CM | POA: Diagnosis not present

## 2020-07-01 DIAGNOSIS — Z87891 Personal history of nicotine dependence: Secondary | ICD-10-CM | POA: Diagnosis not present

## 2020-07-01 DIAGNOSIS — J45909 Unspecified asthma, uncomplicated: Secondary | ICD-10-CM | POA: Diagnosis not present

## 2020-08-03 ENCOUNTER — Telehealth: Payer: BC Managed Care – PPO | Admitting: Nurse Practitioner

## 2020-08-03 DIAGNOSIS — J029 Acute pharyngitis, unspecified: Secondary | ICD-10-CM

## 2020-08-03 DIAGNOSIS — Z20822 Contact with and (suspected) exposure to covid-19: Secondary | ICD-10-CM

## 2020-08-03 DIAGNOSIS — R5081 Fever presenting with conditions classified elsewhere: Secondary | ICD-10-CM

## 2020-08-03 DIAGNOSIS — R52 Pain, unspecified: Secondary | ICD-10-CM

## 2020-08-03 DIAGNOSIS — R059 Cough, unspecified: Secondary | ICD-10-CM

## 2020-08-03 MED ORDER — NAPROXEN 500 MG PO TABS: 500.0000 mg | ORAL_TABLET | Freq: Two times a day (BID) | ORAL | 0 refills | Status: DC

## 2020-08-03 MED ORDER — BENZONATATE 100 MG PO CAPS: 100.0000 mg | ORAL_CAPSULE | Freq: Three times a day (TID) | ORAL | 0 refills | Status: DC | PRN

## 2020-08-03 NOTE — Progress Notes (Signed)
E-Visit for Corona Virus Screening  Your current symptoms could be consistent with the coronavirus.  Many health care providers can now test patients at their office but not all are.  [facility] has multiple testing sites. For information on our COVID testing locations and hours go to [email or web address]   Testing Information: The COVID-19 Community Testing sites are testing BY APPOINTMENT ONLY.  You can schedule online at [email or web address]  If you do not have access to a smart phone or computer you may call [phone] for an appointment.   Additional testing sites in the Community:  . For [facility] Testing sites in [place]  https://www.cvs.com/minuteclinic/covid-19-testing  . For Pop-up testing sites in [place]  https://covid19.ncdhhs.gov/about-covid-19/testing/find-my-testing-place/pop-testing-sites  . For [facility] https://[place].gov[place]/human-services/health-department/coronavirus-covid-19-info/covid-19-testing  . For [place] testing in [place] and [place] https://www.[place].gov/[place]/human-services/health-department/coronavirus-covid-19-info/covid-19-testing  . For [facility] testing in [place]   https://[facility].care/covidtesting  For  more information about community testing call [phone]   Please quarantine yourself while awaiting your test results. Please stay home for a minimum of 10 days from the first day of illness with improving symptoms and you have had 24 hours of no fever (without the use of Tylenol (Acetaminophen) Motrin (Ibuprofen) or any fever reducing medication).  Also - Do not get tested prior to returning to work because once you have had a positive test the test can stay positive for more than a month in some cases.   You should wear a mask or cloth face covering over your nose and mouth if you must be around other people or animals, including pets (even at home). Try to  stay at least 6 feet away from other people. This will protect the people around you.  Please continue good preventive care measures, including:  frequent hand-washing, avoid touching your face, cover coughs/sneezes, stay out of crowds and keep a 6 foot distance from others.  COVID-19 is a respiratory illness with symptoms that are similar to the flu. Symptoms are typically mild to moderate, but there have been cases of severe illness and death due to the virus.   The following symptoms may appear 2-14 days after exposure: . Fever . Cough . Shortness of breath or difficulty breathing . Chills . Repeated shaking with chills . Muscle pain . Headache . Sore throat . New loss of taste or smell . Fatigue . Congestion or runny nose . Nausea or vomiting . Diarrhea  Go to the nearest hospital ED for assessment if fever/cough/breathlessness are severe or illness seems like a threat to life.  It is vitally important that if you feel that you have an infection such as this virus or any other virus that you stay home and away from places where you may spread it to others.  You should avoid contact with people age 65 and older.   You can use medication such as A prescription cough medication called Tessalon Perles 100 mg. You may take 1-2 capsules every 8 hours as needed for cough and A prescription anti-inflammatory called Naprosyn 500 mg. Take twice daily as needed for fever or body aches for 2 weeks  You may also take acetaminophen (Tylenol) as needed for fever.  Reduce your risk of any infection by using the same precautions used for avoiding the common cold or flu:  . Wash your hands often with soap and warm water for at least 20 seconds.  If soap and water are not readily available, use an alcohol-based hand sanitizer with at least 60% alcohol.  .   If coughing or sneezing, cover your mouth and nose by coughing or sneezing into the elbow areas of your shirt or coat, into a tissue or into your sleeve  (not your hands). . Avoid shaking hands with others and consider head nods or verbal greetings only. . Avoid touching your eyes, nose, or mouth with unwashed hands.  . Avoid close contact with people who are sick. . Avoid places or events with large numbers of people in one location, like concerts or sporting events. . Carefully consider travel plans you have or are making. . If you are planning any travel outside or inside the [place], visit the CDC's Travelers' Health webpage for the latest health notices. . If you have some symptoms but not all symptoms, continue to monitor at home and seek medical attention if your symptoms worsen. . If you are having a medical emergency, call 911.  HOME CARE . Only take medications as instructed by your medical team. . Drink plenty of fluids and get plenty of rest. . A steam or ultrasonic humidifier can help if you have congestion.   GET HELP RIGHT AWAY IF YOU HAVE EMERGENCY WARNING SIGNS** FOR COVID-19. If you or someone is showing any of these signs seek emergency medical care immediately. Call 911 or proceed to your closest emergency facility if: . You develop worsening high fever. . Trouble breathing . Bluish lips or face . Persistent pain or pressure in the chest . New confusion . Inability to wake or stay awake . You cough up blood. . Your symptoms become more severe  **This list is not all possible symptoms. Contact your medical provider for any symptoms that are sever or concerning to you.  MAKE SURE YOU   Understand these instructions.  Will watch your condition.  Will get help right away if you are not doing well or get worse.  Your e-visit answers were reviewed by a board certified advanced clinical practitioner to complete your personal care plan.  Depending on the condition, your plan could have included both over the counter or prescription medications.  If there is a problem please reply once you have received a response from your  provider.  Your safety is important to [place].  If you have drug allergies check your prescription carefully.    You can use MyChart to ask questions about today's visit, request a non-urgent call back, or ask for a work or school excuse for 24 hours related to this e-Visit. If it has been greater than 24 hours you will need to follow up with your provider, or enter a new e-Visit to address those concerns. You will get an e-mail in the next two days asking about your experience.  I hope that your e-visit has been valuable and will speed your recovery. Thank you for using e-visits.   5-10 minutes spent reviewing and documenting in chart.

## 2020-08-13 ENCOUNTER — Telehealth: Payer: Self-pay | Admitting: Cardiology

## 2020-08-13 DIAGNOSIS — Z79899 Other long term (current) drug therapy: Secondary | ICD-10-CM

## 2020-08-13 DIAGNOSIS — I10 Essential (primary) hypertension: Secondary | ICD-10-CM

## 2020-08-13 NOTE — Telephone Encounter (Signed)
Left message for patient to call back.   Also sent mychart message.

## 2020-08-13 NOTE — Telephone Encounter (Signed)
Left message for patient to call back  

## 2020-08-13 NOTE — Telephone Encounter (Signed)
Pt c/o BP issue: STAT if pt c/o blurred vision, one-sided weakness or slurred speech  1. What are your last 5 BP readings? 173/113 today,   2. Are you having any other symptoms (ex. Dizziness, headache, blurred vision, passed out)? no  3. What is your BP issue? Patient states her BP has been very high. She states she will send more BP readings through mychart. She states it ranges around 150/100.

## 2020-08-13 NOTE — Telephone Encounter (Signed)
[facility] is returning [facility] call. She states her phone did not ring she just got notification of the VM. She states if she does not answer when calling back you can reach out to her via [facility]. Please advise.

## 2020-08-16 MED ORDER — VALSARTAN 80 MG PO TABS: 80.0000 mg | ORAL_TABLET | Freq: Every day | ORAL | 3 refills | Status: DC

## 2020-08-27 DIAGNOSIS — Z20828 Contact with and (suspected) exposure to other viral communicable diseases: Secondary | ICD-10-CM | POA: Diagnosis not present

## 2020-08-30 ENCOUNTER — Other Ambulatory Visit: Payer: Self-pay

## 2020-08-30 ENCOUNTER — Ambulatory Visit (INDEPENDENT_AMBULATORY_CARE_PROVIDER_SITE_OTHER): Payer: BC Managed Care – PPO | Admitting: Pharmacist Clinician (PhC)/ Clinical Pharmacy Specialist

## 2020-08-30 DIAGNOSIS — I10 Essential (primary) hypertension: Secondary | ICD-10-CM | POA: Diagnosis not present

## 2020-08-30 MED ORDER — VALSARTAN 160 MG PO TABS: 160.0000 mg | ORAL_TABLET | Freq: Every day | ORAL | 6 refills | Status: DC

## 2020-08-30 NOTE — Patient Instructions (Signed)
Return for a a follow up appointment March 7 at 2 pm  Go to the lab in 2 weeks to check kidney function  Check your blood pressure at home daily and keep record of the readings.  Take your BP meds as follows:  Increase valsartan to 160 mg once daily  Continue with all other medications  Bring all of your meds, your BP cuff and your record of home blood pressures to your next appointment.  Exercise as you're able, try to walk approximately 30 minutes per day.  Keep salt intake to a minimum, especially watch canned and prepared boxed foods.  Eat more fresh fruits and vegetables and fewer canned items.  Avoid eating in fast food restaurants.    HOW TO TAKE YOUR BLOOD PRESSURE: . Rest 5 minutes before taking your blood pressure. .  Don't smoke or drink caffeinated beverages for at least 30 minutes before. . Take your blood pressure before (not after) you eat. . Sit comfortably with your back supported and both feet on the floor (don't cross your legs). . Elevate your arm to heart level on a table or a desk. . Use the proper sized cuff. It should fit smoothly and snugly around your bare upper arm. There should be enough room to slip a fingertip under the cuff. The bottom edge of the cuff should be 1 inch above the crease of the elbow. . Ideally, take 3 measurements at one sitting and record the average.

## 2020-08-30 NOTE — Assessment & Plan Note (Signed)
Patient with uncontrolled hypertension, currently on valsartan 80 mg daily.  Heart rate elevated at 102.  Had long discussion on options for lowering pressure, will most likely need multiple medications.  Today will increase valsartan to 160 mg and have her repeat metabolic panel in 2 weeks.  She is scheduled to return in 3 weeks for follow up.  At that time would consider adding a beta blocker to help with heart rate.  Would consider nebivolol, now that it is available in generic form.  Also had discussion about sodium intake and advised that she stop using salt shaker at table as much as possible.  Explained benefit of lowering pressure to help meet goal.  She will work on this before her next visit.

## 2020-08-30 NOTE — Progress Notes (Signed)
08/30/2020 Lisa Waters May 05, 1981 [id number]   HPI:  Lisa [facility] is a 40 y.o. female patient of [name], with a PMH below who presents today for hypertension clinic evaluation.  She was last seen by [name] in October at which time her blood pressure was 150/100.  He had her re-start diltiazem 120 mg for both blood pressure and heart rate control.  He made note that because of intermittent asthma, we would avoid beta blocker use.   There was some confusion with filling the diltiazem, as she had previously filled a 90 day supply, but threw the medication away as she was not using it when she went to see him.  Pharmacy would not refill, as was refill too soon.   In January, she sent a message to the office about elevated home BP readings, still in the 150/100+ range.   At that time patient stated she never re-started the diltiazem as it caused her to be lightheaded, dizzy and sleepy.  She was instead given valsartan 80 mg and asked to follow up with [facility].  Today patient is here for follow up.  She has tolerated the valsartan without any issues, but notes it has not made any significant impact on her blood pressure.  She also notes that her asthma issues were related to cancer and treatment, and that she hasn't had any problems recently.  She is willing to try a beta blocker if that we feel it is an appropriate course of action.   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    Past Medical History: Moderate mitral regurgitation By echo, last 4.2021, re-assess in 2 years if still symptom free  hodgkins lymphoma In remission, post chemo and radiation  hyperlipidemia Diet controlled  hypothyroidism On synthroid 65mcg  asthma Mild/intermittent, usually when pt is sick - related to cancer, not chronic  Prophylactic mastectomy Implant developed hematoma in right, capsular contracture in left, now using  tissue expander in right     Blood Pressure Goal:  130/80  Current Medications: valsartan 80 mg  Family Hx: father recently had stent (90%) blockage; doing well, now 4, mom and brother haven't been to [place] in years; no children  Social Hx: no tobacco, occasional alcohol; no caffeine - makes her feel like climbing wall  Diet: mostly home cooked foods, does add salt at table, "likes her salt"; veggies mostly canned or fresh  Exercise: nothing strenuous due to breast surgeries; has gym at home with treadmill, uses occasionally  Home BP readings: states systolic range 850-277, diastolic mostly 412-878, occasionally in the 90's  Intolerances:  diltiazem - feels dizzy/lightheaded/sleepy  Labs: 12/21:  Na 136, K 3.8, Glu 106, BUN 8, SCr 1.05  Wt Readings from Last 3 Encounters:  08/30/20 174 lb (78.9 kg)  04/28/20 187 lb (84.8 kg)  04/26/20 190 lb 12.8 oz (86.5 kg)   BP Readings from Last 3 Encounters:  08/30/20 (!) 162/104  04/28/20 (!) 150/100  04/26/20 (!) 150/100   Pulse Readings from Last 3 Encounters:  08/30/20 (!) 102  04/26/20 92  05/05/19 (!) 115    Current Outpatient Medications  Medication Sig Dispense Refill  . valsartan (DIOVAN) 160 MG tablet Take 1 tablet (160 mg total) by mouth daily. 30 tablet 6  . ALPRAZolam (XANAX) 0.25 MG tablet Take 1 tablet (0.25 mg total) by mouth 2 (two) times daily as needed  for anxiety. 30 tablet 0  . benzonatate (TESSALON PERLES) 100 MG capsule Take 1 capsule (100 mg total) by mouth 3 (three) times daily as needed. 20 capsule 0  . BLISOVI 24 FE 1-20 MG-MCG(24) tablet Take 1 tablet by mouth daily.    Marland Kitchen LORazepam (ATIVAN) 0.5 MG tablet Take 1 tablet (0.5 mg total) by mouth 2 (two) times daily as needed for anxiety. 30 tablet 1  . naproxen (NAPROSYN) 500 MG tablet Take 1 tablet (500 mg total) by mouth 2 (two) times daily with a meal. 30 tablet 0  . sertraline (ZOLOFT) 100 MG tablet Take 1 tablet (100 mg total) by mouth every evening. 90  tablet 1  . SYNTHROID 88 MCG tablet Take 1 tablet (88 mcg total) by mouth daily. 90 tablet 3  . thalidomide (THALOMID) 50 MG capsule Take 50 mg by mouth daily.    . Vitamin D, Ergocalciferol, (DRISDOL) 1.25 MG (50000 UNIT) CAPS capsule TAKE 1 CAPSULE BY MOUTH ONE TIME PER WEEK 12 capsule 2   No current facility-administered medications for this visit.    Allergies  Allergen Reactions  . Codeine Itching  . Fish Allergy Itching    [place]  . Iohexol Hives  . Pindolol     Avoid non selective beta blockers due to dyspnea  . Iodinated Diagnostic Agents Rash    Has received since rash developed without pre-meds without development of rash.    Past Medical History:  Diagnosis Date  . Anxiety   . Asthma    mild intermittent as of 07/2017 - just when patient gets sick  . Depression   . Female infertility   . GERD (gastroesophageal reflux disease)    Hx - no current problems, no meds  . H/O amaurosis fugax Fall 2012   Resolved, no current problems, Hx-MRI of brain/brain stem: no acute abnormality, small area of encephalomalacia left superior cerebellum that could be prior trauma, infection, or lacunar infarct  . History of Hodgkin's lymphoma 2007   in remission, sees [facility], prior with Dr. Elwanda Brooklyn; prior chemotherapy and radiation therapy; sees yearly in December, no current problems  . Hyperlipidemia 07/2017   diet control, no meds  . Hypothyroidism   . Moderate mitral regurgitation by prior echocardiogram 09/2010   10/2015: EF 55-60%. Normal WM. Normal D Fxn. Bilateral MVP w/ Mod MR (very eccentric). Normal LA, RV & RA. Normal PAP.  Marland Kitchen MVP (mitral valve prolapse) 09/2010   Dr. Ellyn Hack, [facility]; a) Echo 3/'12: Mod MR; EF 60-65%; b) TEE 5/'12: Nl LV Fxn, EF 60-65%, mild, holosystolic prolapse of the medial anterior leaflet. Mod MR . c) Echo 3/'15: Moderate, holosystolic prolapse of  medial. Mod MR d) TM Stress Echo (@ [facility]) Nl Lv Fxn, Mild-Mod MR @ Res0 --> Mod MR at HR 181 bpm (7:22  min, 10.10 METS) w/p WMA  . Oral ulcer   . Palpitations 10/2012   holter monitor, Dr. Ellyn Hack; no arrhythmia, no current problems    Blood pressure (!) 162/104, pulse (!) 102, height 5\' 7"  (1.702 m), weight 174 lb (78.9 kg).  Hypertension Patient with uncontrolled hypertension, currently on valsartan 80 mg daily.  Heart rate elevated at 102.  Had long discussion on options for lowering pressure, will most likely need multiple medications.  Today will increase valsartan to 160 mg and have her repeat metabolic panel in 2 weeks.  She is scheduled to return in 3 weeks for follow up.  At that time would consider adding a beta blocker  to help with heart rate.  Would consider nebivolol, now that it is available in generic form.  Also had discussion about sodium intake and advised that she stop using salt shaker at table as much as possible.  Explained benefit of lowering pressure to help meet goal.  She will work on this before her next visit.     Tommy Medal PharmD CPP [facility] [address] [place], [place] [address] [phone]

## 2020-08-31 ENCOUNTER — Ambulatory Visit (INDEPENDENT_AMBULATORY_CARE_PROVIDER_SITE_OTHER): Payer: BC Managed Care – PPO | Admitting: Cardiology

## 2020-08-31 ENCOUNTER — Encounter: Payer: Self-pay | Admitting: Cardiology

## 2020-08-31 VITALS — BP 142/90 | HR 105 | Ht 67.0 in | Wt 170.0 lb

## 2020-08-31 DIAGNOSIS — E785 Hyperlipidemia, unspecified: Secondary | ICD-10-CM | POA: Insufficient documentation

## 2020-08-31 DIAGNOSIS — R000 Tachycardia, unspecified: Secondary | ICD-10-CM

## 2020-08-31 DIAGNOSIS — I341 Nonrheumatic mitral (valve) prolapse: Secondary | ICD-10-CM

## 2020-08-31 DIAGNOSIS — I7300 Raynaud's syndrome without gangrene: Secondary | ICD-10-CM

## 2020-08-31 DIAGNOSIS — I340 Nonrheumatic mitral (valve) insufficiency: Secondary | ICD-10-CM

## 2020-08-31 DIAGNOSIS — I10 Essential (primary) hypertension: Secondary | ICD-10-CM

## 2020-08-31 MED ORDER — VERAPAMIL HCL ER 180 MG PO TBCR: 180.0000 mg | EXTENDED_RELEASE_TABLET | Freq: Every day | ORAL | 3 refills | Status: DC

## 2020-08-31 NOTE — Assessment & Plan Note (Signed)
She had been on low-dose diltiazem, and apparently this was stopped while she was in the hospital.  At this point we are trying to treat blood pressure is much as tachycardia and spasm. Convert from diltiazem to verapamil 180 mg XT dosing.

## 2020-08-31 NOTE — Assessment & Plan Note (Signed)
BP still up with Tachycardia - ARB dose just increased yesterday.    Need HR control -- was on Diltiazem in past - ? Not sure why it was stopped. However,not as effective for HTN management.   Plan: Continue valsartan at current dose, will add verapamil 180 mg nightly ([place] XT)

## 2020-08-31 NOTE — Assessment & Plan Note (Signed)
Has been stable on last couple echocardiograms.  Murmur is barely audible exam.  Plan is to recheck echocardiogram in 2023.

## 2020-08-31 NOTE — Assessment & Plan Note (Signed)
Has not had labs checked in ~ 2 yrs.   We will add lipid panel to her upcoming labs for hypertension monitoring.

## 2020-08-31 NOTE — Progress Notes (Signed)
Primary Care Provider: Denita Lung, MD Cardiologist: Glenetta Hew, MD Electrophysiologist: None  Clinic Note: Chief Complaint  Patient presents with  . Follow-up    Doing well.  Is getting excited to have her final surgery done.  Needs blood pressure better controlled.  . Tachycardia    Her heart rate this morning 118; she is having lots of stress with her and her husband's business  . Hypertension    Valsartan dose just increased yesterday.   ==================================  ASSESSMENT/PLAN   Problem List Items Addressed This Visit    Moderate mitral regurgitation by prior echocardiogram (Chronic)    Has been stable on last couple echocardiograms.  Murmur is barely audible exam.  Plan is to recheck echocardiogram in 2023.      Relevant Medications   verapamil (CALAN-SR) 180 MG CR tablet   MVP (mitral valve prolapse) (Chronic)    Recheck 2D Echo in April 2020      Relevant Medications   verapamil (CALAN-SR) 180 MG CR tablet   Other Relevant Orders   EKG 12-Lead   Essential hypertension (Chronic)    BP still up with Tachycardia - ARB dose just increased yesterday.    Need HR control -- was on Diltiazem in past - ? Not sure why it was stopped. However,not as effective for HTN management.   Plan: Continue valsartan at current dose, will add verapamil 180 mg nightly (Calan XT)      Relevant Medications   verapamil (CALAN-SR) 180 MG CR tablet   Raynauds disease (Chronic)    She had been on low-dose diltiazem, and apparently this was stopped while she was in the hospital.  At this point we are trying to treat blood pressure is much as tachycardia and spasm. Convert from diltiazem to verapamil 180 mg XT dosing.      Relevant Medications   verapamil (CALAN-SR) 180 MG CR tablet   Tachycardia - Primary (Chronic)   Relevant Orders   EKG 12-Lead   Hyperlipidemia with target LDL less than 130 (Chronic)    Has not had labs checked in ~ 2 yrs.   We will add  lipid panel to her upcoming labs for hypertension monitoring.      Relevant Medications   verapamil (CALAN-SR) 180 MG CR tablet   Other Relevant Orders   Lipid panel     ===================================  HPI:    Lisa Waters is a 40 y.o. female with a PMH notable for history of Hodgkin's lymphoma That (treated with XRT), MVP with Moderate MR (has been stable), and now recent diagnosis of Left-Sided Breast Cancer (status post prophylactic mastectomy who presents today for 19-month follow-up.  Lisa Waters was last seen on April 26, 2020.  She had multiple hospitalizations and procedures following her breast mass.  Initial surgery was 03/05/2018-bilateral nipple sparing mastectomy with reconstruction by plastic surgery.  3 days later she underwent tissue expander placement for breast reconstruction.  2 weeks later had redo expiratory surgery for evacuation of hematoma from the right breast and removal of right breast implant.  Almost 2 weeks later underwent I&D revision. => She was doing really well.  Stable cardiac standpoint.  A little bit fatigued and worn out after all of her surgeries and different treatments, but had good spirits.  Had not really noted any tachycardia spells or high blood pressure spells.  No heart failure symptoms.  She had been on diltiazem, but this was stopped during her surgery hospitalization.  Recent Hospitalizations:  07/01/2020:R Breast tissue Expander Placement  Reviewed  CV studies:    The following studies were reviewed today: (if available, images/films reviewed: From Epic Chart or Care Everywhere) . None:  Interval History:   Lisa Waters returns today actually doing fairly well.  She does note that her heart rate is low vascular, if she feels going at a fast clip, but denies any lightheadedness or dizziness.  No chest pain or pressure.  No dyspnea.  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  Her heart rate is fast, but not irregular.  She has not had any chest pain or pressure with rest or exertion.  No real exertional dyspnea.  She has become more active.  She has been down to [place] to help some augmentation procedures done for her left breast expansion, indicating that he had that not work, the Psychologist, sport and exercise he would not of the right breast.  She is eagerly awaiting getting her final surgery done.  She cannot do much to move exercise, so she is doing weighted hoop exercising which allows her to get lower body exercise without aggravating her wounds.  CV Review of Symptoms (Summary) Cardiovascular ROS: no chest pain or dyspnea on exertion positive for - rapid heart rate and Increase stress negative for - irregular heartbeat, orthopnea, palpitations, paroxysmal nocturnal dyspnea, shortness of breath or Syncope or near syncope, TIA/amaurosis fugax, claudication  The patient does not have symptoms concerning for COVID-19 infection (fever, chills, cough, or new shortness of breath).   REVIEWED OF SYSTEMS   Review of Systems  Constitutional: Negative for malaise/fatigue and weight loss.  HENT: Negative for congestion and nosebleeds.   Respiratory: Negative for cough (every now & then) and shortness of breath.   Gastrointestinal: Negative for blood in stool and melena.  Genitourinary: Negative for hematuria.  Musculoskeletal: Negative for joint pain and myalgias.  Neurological: Negative for dizziness and headaches.  Psychiatric/Behavioral: Negative for depression and memory loss. The patient is nervous/anxious (but stable). The patient does not have insomnia.     I have reviewed and (if needed) personally updated the patient's problem list, medications, allergies, past medical and surgical history, social and family history.   PAST MEDICAL HISTORY    Past Medical History:  Diagnosis Date  . Anxiety   . Asthma    mild intermittent as of 07/2017 - just when patient gets sick  . Depression   . Female infertility   . GERD (gastroesophageal reflux disease)    Hx - no current problems, no meds  . H/O amaurosis fugax Fall 2012   Resolved, no current problems, Hx-MRI of brain/brain stem: no acute abnormality, small area of encephalomalacia left superior cerebellum that could be prior trauma, infection, or lacunar infarct  . History of Hodgkin's lymphoma 2007   in remission, sees [facility], prior with Dr. Elwanda Brooklyn; prior chemotherapy and radiation therapy; sees yearly in December, no current problems  . Hyperlipidemia 07/2017   diet control, no meds  . Hypothyroidism   . Moderate mitral regurgitation by prior echocardiogram 09/2010   10/2015: EF 55-60%. Normal WM. Normal D Fxn. Bilateral MVP w/ Mod MR (very eccentric). Normal LA, RV & RA. Normal PAP.  Marland Kitchen MVP (mitral valve prolapse) 09/2010   Dr. Ellyn Hack, [facility]; a) Echo 3/'12: Mod MR; EF 60-65%; b) TEE 5/'12: Nl LV Fxn, EF 60-65%,  mild, holosystolic prolapse of the medial anterior leaflet. Mod MR . c) Echo 3/'15: Moderate, holosystolic prolapse of  medial. Mod MR d) TM Stress Echo (@ [facility]) Nl Lv Fxn, Mild-Mod MR @ Res0 --> Mod MR at HR 181 bpm (7:22 min, 10.10 METS) w/p WMA  . Oral ulcer   . Palpitations 10/2012   holter monitor, Dr. Ellyn Hack; no arrhythmia, no current problems    PAST SURGICAL HISTORY   Past Surgical History:  Procedure Laterality Date  . APPENDECTOMY  1993  . BREAST ENHANCEMENT SURGERY  2002  . COLONOSCOPY  07/2014   Dr. Collene Mares - normal  . DILATATION & CURETTAGE/HYSTEROSCOPY WITH MYOSURE N/A 01/29/2018   Procedure: DILATATION & CURETTAGE/HYSTEROSCOPY WITH MYOSURE POLYPECTOMY;  Surgeon: Charyl Bigger, MD;  Location: [facility];  Service: Gynecology;  Laterality: N/A;  . ESOPHAGOGASTRODUODENOSCOPY  04/11/13   [facility] Dr. Collene Mares  . FOOT SURGERY Bilateral     on toes  . INSERTION CENTRAL VENOUS ACCESS DEVICE W/ SUBCUTANEOUS PORT  2006  . LYMPH NODE BIOPSY  2006   under right arm  . NM MYOCAR PERF WALL MOTION  12/2010   persantine myoview - moderate breast attenuation (fixed mid-distal anterior defect), EF 68%, low risk scan  . RHINOPLASTY  2007   deviated septum  . TEE WITHOUT CARDIOVERSION  11/2010   Nl LV Fxn, EF 60-65%, mild, holosystolic prolapse of the medial anterior leaflet. Mod MR .   Marland Kitchen TRANSTHORACIC ECHOCARDIOGRAM  3/'14; 3/'15   LVEF 60-65%, wall motion normal, LV function normal, moderate holosystolic MVP (medial the middle scallop of the posterior leaflet), moderate regurgitation (directed posteriorly), no shunt -- stable over 2 years  . TRANSTHORACIC ECHOCARDIOGRAM  10/2015   EF 55-60%. Normal WM. Normal D Fxn. Bilateral MVP w/ Mod MR (very eccentric). Normal LA, RV & RA. Normal PAP.  Marland Kitchen TRANSTHORACIC ECHOCARDIOGRAM  10/22/2019   EF 55 to 60%.  Mild LVH.  GRII DD.  Elevated LAP.  Mild LA dilation.  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  Normal aortic valve.  (Stable  . WISDOM TOOTH EXTRACTION      Immunization History  Administered Date(s) Administered  . Influenza Split 05/20/2012  . Influenza,inj,Quad PF,6+ Mos 04/07/2013  . PPD Test 03/04/2013  . Tdap 04/01/2015    MEDICATIONS/ALLERGIES   Current Meds  Medication Sig  . sertraline (ZOLOFT) 100 MG tablet Take 1 tablet (100 mg total) by mouth every evening.  Marland Kitchen SYNTHROID 88 MCG tablet Take 1 tablet (88 mcg total) by mouth daily.  . valsartan (DIOVAN) 160 MG tablet Take 1 tablet (160 mg total) by mouth daily.  . verapamil (CALAN-SR) 180 MG CR tablet Take 1 tablet (180 mg total) by mouth at bedtime.  . Vitamin D, Ergocalciferol, (DRISDOL) 1.25 MG (50000 UNIT) CAPS capsule TAKE 1 CAPSULE BY MOUTH ONE TIME PER WEEK    Allergies  Allergen Reactions  . Codeine Itching  . Fish Allergy Itching    Mahi Mahi  . Iohexol Hives  . Pindolol     Avoid non selective beta blockers due  to dyspnea  . Iodinated Diagnostic Agents Rash    Has received since rash developed without pre-meds without development of rash.    SOCIAL HISTORY/FAMILY HISTORY   Reviewed in Epic:  Pertinent findings:  Social History   Tobacco Use  . Smoking status: Former Smoker    Packs/day: 1.00    Years: 10.00    Pack years: 10.00    Types: Cigarettes    Quit  date: 11/07/2009    Years since quitting: 10.8  . Smokeless tobacco: Never Used  Vaping Use  . Vaping Use: Never used  Substance Use Topics  . Alcohol use: Yes    Alcohol/week: 1.0 - 2.0 standard drink    Types: 1 - 2 Cans of beer per week    Comment: social Beer/Liquor  . Drug use: No   Social History   Social History Narrative   Married, 0 children, has failed fertility treatment & has given up attempts at IVF as well as adoption. 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   Formerly was Office manager at [employer], exercise - active in the business,  Architect.  07/2017    OBJCTIVE -PE, EKG, labs   Wt Readings from Last 3 Encounters:  08/31/20 170 lb (77.1 kg)  08/30/20 174 lb (78.9 kg)  04/28/20 187 lb (84.8 kg)    Physical Exam: BP (!) 142/90   Pulse (!) 105   Ht 5\' 7"  (1.702 m)   Wt 170 lb (77.1 kg)   BMI 26.63 kg/m  Physical Exam Vitals reviewed.  Constitutional:      General: She is not in acute distress.    Appearance: Normal appearance. She is normal weight. She is not ill-appearing or toxic-appearing.  HENT:     Head: Normocephalic and atraumatic.  Neck:     Vascular: No carotid bruit.  Cardiovascular:     Rate and Rhythm: Normal rate and regular rhythm.  No extrasystoles are present.    Chest Wall: PMI is not displaced.     Pulses: Normal pulses and intact distal pulses. A midsystolic click.     Heart sounds: Murmur heard.   Low-pitched blowing holosystolic murmur is present with a grade of 2/6 at the lower left sternal border and apex radiating to the back. No friction  rub. No gallop.   Pulmonary:     Effort: Pulmonary effort is normal. No respiratory distress.     Breath sounds: Normal breath sounds.  Chest:     Chest wall: No tenderness.  Musculoskeletal:        General: No swelling or tenderness. Normal range of motion.     Cervical back: Normal range of motion and neck supple.  Skin:    General: Skin is warm and dry.  Neurological:     General: No focal deficit present.     Mental Status: She is alert and oriented to person, place, and time.     Motor: No weakness.     Gait: Gait normal.  Psychiatric:        Mood and Affect: Mood normal.        Behavior: Behavior normal.        Thought Content: Thought content normal.        Judgment: Judgment normal.      Adult ECG Report  Rate: 105 ;  Rhythm: sinus tachycardia and otherwise normal axi, intervals & durations;   Narrative Interpretation:    Recent Labs: She is due for lipids to be checked. Lab Results  Component Value Date   CHOL 285 (H) 07/26/2017   HDL 116 07/26/2017   LDLCALC 154 (H) 07/26/2017   TRIG 59 07/26/2017   CHOLHDL 2.5 07/26/2017   Lab Results  Component Value Date   CREATININE 0.62 02/26/2019   BUN 9 02/26/2019   NA 135 02/26/2019   K 4.4 02/26/2019   CL 92 (L) 02/26/2019   CO2 22 02/26/2019  CBC Latest Ref Rng & Units 02/26/2019 01/23/2018 07/26/2017  WBC 3.4 - 10.8 x10E3/uL 4.4 6.2 4.8  Hemoglobin 11.1 - 15.9 g/dL 13.4 12.4 13.2  Hematocrit 34.0 - 46.6 % 38.8 36.9 38.6  Platelets 150 - 450 x10E3/uL 255 193 306    Lab Results  Component Value Date   TSH 7.420 (H) 02/26/2019    ==================================================  COVID-19 Education: The signs and symptoms of COVID-19 were discussed with the patient and how to seek care for testing (follow up with PCP or arrange E-visit).   The importance of social distancing and COVID-19 vaccination was discussed today. The patient is practicing social distancing & Masking.   I spent a total of 40  minutes with the patient spent in direct patient consultation.  Additional time spent with chart review  / charting (studies, outside notes, etc): 15 min Total Time: 55 min  Current medicines are reviewed at length with the patient today.  (+/- concerns) n/a  This visit occurred during the SARS-CoV-2 public health emergency.  Safety protocols were in place, including screening questions prior to the visit, additional usage of staff PPE, and extensive cleaning of exam room while observing appropriate contact time as indicated for disinfecting solutions.  Notice: This dictation was prepared with Dragon dictation along with smaller phrase technology. Any transcriptional errors that result from this process are unintentional and may not be corrected upon review.  Patient Instructions / Medication Changes & Studies & Tests Ordered   Patient Instructions  Medication Instructions:   START TAKING  Verapamil CR 180 MG   Take at bedtime   continue with all medications *If you need a refill on your cardiac medications before your next appointment, please call your pharmacy*   Lab Work: Lipid  If you have labs (blood work) drawn today and your tests are completely normal, you will receive your results only by: Marland Kitchen MyChart Message (if you have MyChart) OR . A paper copy in the mail If you have any lab test that is abnormal or we need to change your treatment, we will call you to review the results.   Testing/Procedures: Not needed   Follow-Up: At [facility], you and your health needs are our priority.  As part of our continuing mission to provide you with exceptional heart care, we have created designated Provider Care Teams.  These Care Teams include your primary Cardiologist (physician) and Advanced Practice Providers (APPs -  Physician Assistants and Nurse Practitioners) who all work together to provide you with the care you need, when you need it.     Your next appointment:   3  month(s) May 2022  The format for your next appointment:   In Person  Provider:   Glenetta Hew, MD   Other Instructions Keep appointment with  CV[facility]    Studies Ordered:   Orders Placed This Encounter  Procedures  . Lipid panel  . EKG 12-Lead     Glenetta Hew, M.D., M.S. Interventional Cardiologist   Pager # [phone] Phone # [phone] [address] [place], [place] [address]   Thank you for choosing Heartcare at [facility]!!

## 2020-08-31 NOTE — Patient Instructions (Signed)
Medication Instructions:   START TAKING  Verapamil CR 180 MG   Take at bedtime   continue with all medications *If you need a refill on your cardiac medications before your next appointment, please call your pharmacy*   Lab Work: Lipid  If you have labs (blood work) drawn today and your tests are completely normal, you will receive your results only by: Marland Kitchen MyChart Message (if you have MyChart) OR . A paper copy in the mail If you have any lab test that is abnormal or we need to change your treatment, we will call you to review the results.   Testing/Procedures: Not needed   Follow-Up: At [facility], you and your health needs are our priority.  As part of our continuing mission to provide you with exceptional heart care, we have created designated Provider Care Teams.  These Care Teams include your primary Cardiologist (physician) and Advanced Practice Providers (APPs -  Physician Assistants and Nurse Practitioners) who all work together to provide you with the care you need, when you need it.     Your next appointment:   3 month(s) May 2022  The format for your next appointment:   In Person  Provider:   Glenetta Hew, MD   Other Instructions Keep appointment with  CV[facility]

## 2020-08-31 NOTE — Assessment & Plan Note (Signed)
Recheck 2D Echo in April 2020

## 2020-09-06 DIAGNOSIS — Z20828 Contact with and (suspected) exposure to other viral communicable diseases: Secondary | ICD-10-CM | POA: Diagnosis not present

## 2020-09-08 ENCOUNTER — Other Ambulatory Visit: Payer: Self-pay | Admitting: Family Medicine

## 2020-09-08 DIAGNOSIS — F411 Generalized anxiety disorder: Secondary | ICD-10-CM

## 2020-09-08 NOTE — Telephone Encounter (Signed)
[facility] is requesting to fill pt zoloft. Please advise [facility]

## 2020-09-14 ENCOUNTER — Encounter: Payer: Self-pay | Admitting: Family Medicine

## 2020-09-15 MED ORDER — ALPRAZOLAM 0.25 MG PO TABS: 0.2500 mg | ORAL_TABLET | Freq: Two times a day (BID) | ORAL | 0 refills | Status: DC | PRN

## 2020-09-17 ENCOUNTER — Other Ambulatory Visit: Payer: Self-pay | Admitting: Cardiology

## 2020-09-17 DIAGNOSIS — R928 Other abnormal and inconclusive findings on diagnostic imaging of breast: Secondary | ICD-10-CM | POA: Diagnosis not present

## 2020-09-17 DIAGNOSIS — N644 Mastodynia: Secondary | ICD-10-CM | POA: Diagnosis not present

## 2020-09-17 DIAGNOSIS — E785 Hyperlipidemia, unspecified: Secondary | ICD-10-CM | POA: Diagnosis not present

## 2020-09-17 DIAGNOSIS — Z79899 Other long term (current) drug therapy: Secondary | ICD-10-CM | POA: Diagnosis not present

## 2020-09-18 LAB — LIPID PANEL
Chol/HDL Ratio: 2.8 ratio (ref 0.0–4.4)
Cholesterol, Total: 273 mg/dL — ABNORMAL HIGH (ref 100–199)
HDL: 97 mg/dL (ref >39)
LDL Chol Calc (NIH): 165 mg/dL — ABNORMAL HIGH (ref 0–99)
Triglycerides: 72 mg/dL (ref 0–149)
VLDL Cholesterol Cal: 11 mg/dL (ref 5–40)

## 2020-09-18 LAB — BASIC METABOLIC PANEL
BUN/Creatinine Ratio: 10 (ref 9–23)
BUN: 9 mg/dL (ref 6–24)
CO2: 20 mmol/L (ref 20–29)
Calcium: 10 mg/dL (ref 8.7–10.2)
Chloride: 93 mmol/L — ABNORMAL LOW (ref 96–106)
Creatinine, Ser: 0.9 mg/dL (ref 0.57–1.00)
Glucose: 76 mg/dL (ref 65–99)
Potassium: 4.6 mmol/L (ref 3.5–5.2)
Sodium: 131 mmol/L — ABNORMAL LOW (ref 134–144)
eGFR: 83 mL/min/{1.73_m2} (ref >59)

## 2020-09-20 ENCOUNTER — Other Ambulatory Visit: Payer: Self-pay

## 2020-09-20 ENCOUNTER — Ambulatory Visit (INDEPENDENT_AMBULATORY_CARE_PROVIDER_SITE_OTHER): Payer: BC Managed Care – PPO | Admitting: Pharmacist Clinician (PhC)/ Clinical Pharmacy Specialist

## 2020-09-20 DIAGNOSIS — I10 Essential (primary) hypertension: Secondary | ICD-10-CM

## 2020-09-20 MED ORDER — METOPROLOL SUCCINATE ER 50 MG PO TB24: 50.0000 mg | ORAL_TABLET | Freq: Every day | ORAL | 6 refills | Status: DC

## 2020-09-20 NOTE — Progress Notes (Signed)
09/20/2020 Lisa Waters [birth year] [id number]   HPI:  Lisa Waters is a 40 y.o. female patient of [name], with a PMH below who presents today for hypertension clinic evaluation.  She was last seen by [name] in October at which time her blood pressure was 150/100.  He had her re-start diltiazem 120 mg for both blood pressure and heart rate control.  He made note that because of intermittent asthma, we would avoid beta blocker use.   There was some confusion with filling the diltiazem, as she had previously filled a 90 day supply, but threw the medication away as she was not using it when she went to see him.  Pharmacy would not refill, as was refill too soon.   In January, she sent a message to the office about elevated home BP readings, still in the 150/100+ range.   At that time patient stated she never re-started the diltiazem as it caused her to be lightheaded, dizzy and sleepy.  She was instead given valsartan 80 mg and asked to follow up with CVRR.  At her last visit we increased the valsartan to 160 mg and asked that she repeat metabolic panel after 2 weeks.  She then saw Dr. Ellyn Hack the next day, at which time her heart rate was still > 100.  He added verapamil 180 mg for her as well.    Today she is in the office for follow up.  She did send a MyChart message this morning, noting that her HR was at 150 and felt as if it were beating out of her chest.  Lasted about 12 hours  Down 32 pounds since Sept On cipro 500 mg bid x 7d  and clindamycin 300mg  tid x 7 days for breast infection, last dose today, sunburn yesterday Last TSH 5/21 2.20 Nausea, vomiting for past couple of days - thinks its the antibiotics   Past Medical History: Moderate mitral regurgitation By echo, last 4.2021, re-assess in 2 years if still symptom free  hodgkins lymphoma In remission, post chemo and radiation  hyperlipidemia Diet controlled  hypothyroidism On synthroid 52mcg  asthma Mild/intermittent, usually  when pt is sick - related to cancer, not chronic  Prophylactic mastectomy Implant developed hematoma in right, capsular contracture in left, now using tissue expander in right     Blood Pressure Goal:  130/80  Current Medications: valsartan 80 mg  Family Hx: father recently had stent (90%) blockage; doing well, now 66, mom and brother haven't been to [place] in years; no children  Social Hx: no tobacco, occasional alcohol; no caffeine - makes her feel like climbing wall  Diet: mostly home cooked foods, does add salt at table, "likes her salt"; veggies mostly canned or fresh  Exercise: nothing strenuous due to breast surgeries; has gym at home with treadmill, uses occasionally  Home BP readings: states systolic range 626-948, diastolic mostly 546-270, occasionally in the 90's  Intolerances:  diltiazem - feels dizzy/lightheaded/sleepy  Labs: 12/21:  Na 136, K 3.8, Glu 106, BUN 8, SCr 1.05  Wt Readings from Last 3 Encounters:  09/20/20 170 lb (77.1 kg)  08/31/20 170 lb (77.1 kg)  08/30/20 174 lb (78.9 kg)   BP Readings from Last 3 Encounters:  09/20/20 122/78  08/31/20 (!) 142/90  08/30/20 (!) 162/104   Pulse Readings from Last 3 Encounters:  09/20/20 (!) 114  08/31/20 (!) 105  08/30/20 (!) 102    Current Outpatient Medications  Medication Sig Dispense Refill  .  ALPRAZolam (XANAX) 0.25 MG tablet Take 1 tablet (0.25 mg total) by mouth 2 (two) times daily as needed for anxiety. 20 tablet 0  . metoprolol succinate (TOPROL-XL) 50 MG 24 hr tablet Take 1 tablet (50 mg total) by mouth daily. Take with or immediately following a meal. 30 tablet 6  . sertraline (ZOLOFT) 100 MG tablet TAKE 1 TABLET BY MOUTH EVERY EVENING. 90 tablet 1  . SYNTHROID 88 MCG tablet Take 1 tablet (88 mcg total) by mouth daily. 90 tablet 3  . valsartan (DIOVAN) 160 MG tablet Take 1 tablet (160 mg total) by mouth daily. 30 tablet 6  . Vitamin D, Ergocalciferol, (DRISDOL) 1.25 MG (50000 UNIT) CAPS capsule TAKE 1  CAPSULE BY MOUTH ONE TIME PER WEEK 12 capsule 2   No current facility-administered medications for this visit.    Allergies  Allergen Reactions  . Codeine Itching  . Fish Allergy Itching    Mahi Mahi  . Iohexol Hives  . Pindolol     Avoid non selective beta blockers due to dyspnea  . Iodinated Diagnostic Agents Rash    Has received since rash developed without pre-meds without development of rash.    Past Medical History:  Diagnosis Date  . Anxiety   . Asthma    mild intermittent as of 07/2017 - just when patient gets sick  . Depression   . Female infertility   . GERD (gastroesophageal reflux disease)    Hx - no current problems, no meds  . H/O amaurosis fugax Fall 2012   Resolved, no current problems, Hx-MRI of brain/brain stem: no acute abnormality, small area of encephalomalacia left superior cerebellum that could be prior trauma, infection, or lacunar infarct  . History of Hodgkin's lymphoma 2007   in remission, sees [facility], prior with Dr. Elwanda Brooklyn; prior chemotherapy and radiation therapy; sees yearly in December, no current problems  . Hyperlipidemia 07/2017   diet control, no meds  . Hypothyroidism   . Moderate mitral regurgitation by prior echocardiogram 09/2010   10/2015: EF 55-60%. Normal WM. Normal D Fxn. Bilateral MVP w/ Mod MR (very eccentric). Normal LA, RV & RA. Normal PAP.  Marland Kitchen MVP (mitral valve prolapse) 09/2010   Dr. Ellyn Hack, [facility]; a) Echo 3/'12: Mod MR; EF 60-65%; b) TEE 5/'12: Nl LV Fxn, EF 60-65%, mild, holosystolic prolapse of the medial anterior leaflet. Mod MR . c) Echo 3/'15: Moderate, holosystolic prolapse of  medial. Mod MR d) TM Stress Echo (@ [facility]) Nl Lv Fxn, Mild-Mod MR @ Res0 --> Mod MR at HR 181 bpm (7:22 min, 10.10 METS) w/p WMA  . Oral ulcer   . Palpitations 10/2012   holter monitor, Dr. Ellyn Hack; no arrhythmia, no current problems    Blood pressure 122/78, pulse (!) 114, resp. rate 16, height 5\' 7"  (1.702 m), weight 170 lb (77.1 kg), last  menstrual period 09/17/2020, SpO2 (!) 16 %.  Essential hypertension Patient with essential hypertension, doing better with the increase in valsartan to 160 mg.  Will have her continue with this dose and regular home monitoring.  Patient was in sinus tachycardia at her visit with Dr. Ellyn Hack 3 weeks ago, and appears to still be today.  Reveiwed with Dr. Ellyn Hack in the office today and agree that the verapamil 180 mg is not dropping her heart rate with any success.  Will discontinue that and start her on metoprolol succinate 50 mg once daily.  Advised patient to notify office if her BP drops to below 347 systolic on a  regular basis.  Otherwise we will see her back in the office in 4 weeks for follow up.    Tommy Medal PharmD CPP [facility] [address] [place], [place] [address] [phone]

## 2020-09-20 NOTE — Assessment & Plan Note (Signed)
Patient with essential hypertension, doing better with the increase in valsartan to 160 mg.  Will have her continue with this dose and regular home monitoring.  Patient was in sinus tachycardia at her visit with Dr. Ellyn Hack 3 weeks ago, and appears to still be today.  Reveiwed with Dr. Ellyn Hack in the office today and agree that the verapamil 180 mg is not dropping her heart rate with any success.  Will discontinue that and start her on metoprolol succinate 50 mg once daily.  Advised patient to notify office if her BP drops to below 335 systolic on a regular basis.  Otherwise we will see her back in the office in 4 weeks for follow up.

## 2020-09-20 NOTE — Telephone Encounter (Signed)
OV today with PharmD-see note.

## 2020-09-20 NOTE — Patient Instructions (Signed)
Return for a a follow up appointment Monday April 4 at 2:00 pm  Check your blood pressure at home daily and keep record of the readings.  Take your BP meds as follows:  STOP VERAPAMIL  START METOPROLOL SUCC 50 MG ONCE DAILY IN THE EVENING   If your blood pressure drops too low (< 737 systolic) on a regular basis please send a message thru MyChart to Dr. Ellyn Hack or myself Tommy Medal)  Bring all of your meds, your BP cuff and your record of home blood pressures to your next appointment.  Exercise as you're able, try to walk approximately 30 minutes per day.  Keep salt intake to a minimum, especially watch canned and prepared boxed foods.  Eat more fresh fruits and vegetables and fewer canned items.  Avoid eating in fast food restaurants.    HOW TO TAKE YOUR BLOOD PRESSURE: . Rest 5 minutes before taking your blood pressure. .  Don't smoke or drink caffeinated beverages for at least 30 minutes before. . Take your blood pressure before (not after) you eat. . Sit comfortably with your back supported and both feet on the floor (don't cross your legs). . Elevate your arm to heart level on a table or a desk. . Use the proper sized cuff. It should fit smoothly and snugly around your bare upper arm. There should be enough room to slip a fingertip under the cuff. The bottom edge of the cuff should be 1 inch above the crease of the elbow. . Ideally, take 3 measurements at one sitting and record the average.

## 2020-10-01 DIAGNOSIS — Z20822 Contact with and (suspected) exposure to covid-19: Secondary | ICD-10-CM | POA: Diagnosis not present

## 2020-10-07 DIAGNOSIS — Z8571 Personal history of Hodgkin lymphoma: Secondary | ICD-10-CM | POA: Diagnosis not present

## 2020-10-07 DIAGNOSIS — Z421 Encounter for breast reconstruction following mastectomy: Secondary | ICD-10-CM | POA: Diagnosis not present

## 2020-10-07 DIAGNOSIS — L98499 Non-pressure chronic ulcer of skin of other sites with unspecified severity: Secondary | ICD-10-CM | POA: Diagnosis not present

## 2020-10-07 DIAGNOSIS — T8541XA Breakdown (mechanical) of breast prosthesis and implant, initial encounter: Secondary | ICD-10-CM | POA: Diagnosis not present

## 2020-10-07 DIAGNOSIS — Z923 Personal history of irradiation: Secondary | ICD-10-CM | POA: Diagnosis not present

## 2020-10-07 DIAGNOSIS — Z45811 Encounter for adjustment or removal of right breast implant: Secondary | ICD-10-CM | POA: Diagnosis not present

## 2020-10-07 DIAGNOSIS — L905 Scar conditions and fibrosis of skin: Secondary | ICD-10-CM | POA: Diagnosis not present

## 2020-10-07 DIAGNOSIS — N6121 Granulomatous mastitis, right breast: Secondary | ICD-10-CM | POA: Diagnosis not present

## 2020-10-08 DIAGNOSIS — Z923 Personal history of irradiation: Secondary | ICD-10-CM | POA: Diagnosis not present

## 2020-10-08 DIAGNOSIS — Z8571 Personal history of Hodgkin lymphoma: Secondary | ICD-10-CM | POA: Diagnosis not present

## 2020-10-08 DIAGNOSIS — Z421 Encounter for breast reconstruction following mastectomy: Secondary | ICD-10-CM | POA: Diagnosis not present

## 2020-10-09 DIAGNOSIS — Z923 Personal history of irradiation: Secondary | ICD-10-CM | POA: Diagnosis not present

## 2020-10-09 DIAGNOSIS — Z421 Encounter for breast reconstruction following mastectomy: Secondary | ICD-10-CM | POA: Diagnosis not present

## 2020-10-09 DIAGNOSIS — Z8571 Personal history of Hodgkin lymphoma: Secondary | ICD-10-CM | POA: Diagnosis not present

## 2020-10-18 ENCOUNTER — Ambulatory Visit: Payer: BC Managed Care – PPO

## 2020-10-18 NOTE — Progress Notes (Deleted)
HPI:  Lisa Waters is a 40 y.o. female patient of [name], with a PMH below who presents today for hypertension clinic evaluation.  She was last seen by [name] in October at which time her blood pressure was 150/100.  He had her re-start diltiazem 120 mg for both blood pressure and heart rate control.  He made note that because of intermittent asthma, we would avoid beta blocker use.   There was some confusion with filling the diltiazem, as she had previously filled a 90 day supply, but threw the medication away as she was not using it when she went to see him.  Pharmacy would not refill, as was refill too soon.   In January, she sent a message to the office about elevated home BP readings, still in the 150/100+ range.   At that time patient stated she never re-started the diltiazem as it caused her to be lightheaded, dizzy and sleepy.  She was instead given valsartan 80 mg and asked to follow up with CVRR.  At her last visit we increased the valsartan to 160 mg and asked that she repeat metabolic panel after 2 weeks.  She then saw Dr. Ellyn Hack the next day, at which time her heart rate was still > 100.  He added verapamil 180 mg for her as well.    Today she is in the office for follow up.  She did send a MyChart message this morning, noting that her HR was at 150 and felt as if it were beating out of her chest.  Lasted about 12 hours  Down 32 pounds since Sept On cipro 500 mg bid x 7d  and clindamycin 300mg  tid x 7 days for breast infection, last dose today, sunburn yesterday Last TSH 5/21 2.20 Nausea, vomiting for past couple of days - thinks its the antibiotics   Past Medical History: Moderate mitral regurgitation By echo, last 4.2021, re-assess in 2 years if still symptom free  hodgkins lymphoma In remission, post chemo and radiation  hyperlipidemia Diet controlled  hypothyroidism On synthroid 38mcg  asthma Mild/intermittent, usually when pt is sick - related to cancer, not chronic   Prophylactic mastectomy Implant developed hematoma in right, capsular contracture in left, now using tissue expander in right     Blood Pressure Goal:  130/80  Current Medications:  metoprolol succinate 50mg  daily  valsartan 160 mg daily  Family Hx: father recently had stent (90%) blockage; doing well, now 51, mom and brother haven't been to [place] in years; no children  Social Hx: no tobacco, occasional alcohol; no caffeine - makes her feel like climbing wall  Diet: mostly home cooked foods, does add salt at table, "likes her salt"; veggies mostly canned or fresh  Exercise: nothing strenuous due to breast surgeries; has gym at home with treadmill, uses occasionally  Home BP readings:   Intolerances:  diltiazem - feels dizzy/lightheaded/sleepy  Labs: 12/21:  Na 136, K 3.8, Glu 106, BUN 8, SCr 1.05  Wt Readings from Last 3 Encounters:  09/20/20 170 lb (77.1 kg)  08/31/20 170 lb (77.1 kg)  08/30/20 174 lb (78.9 kg)   BP Readings from Last 3 Encounters:  09/20/20 122/78  08/31/20 (!) 142/90  08/30/20 (!) 162/104   Pulse Readings from Last 3 Encounters:  09/20/20 (!) 114  08/31/20 (!) 105  08/30/20 (!) 102    Current Outpatient Medications  Medication Sig Dispense Refill  . ALPRAZolam (XANAX) 0.25 MG tablet Take 1 tablet (0.25  mg total) by mouth 2 (two) times daily as needed for anxiety. 20 tablet 0  . metoprolol succinate (TOPROL-XL) 50 MG 24 hr tablet Take 1 tablet (50 mg total) by mouth daily. Take with or immediately following a meal. 30 tablet 6  . sertraline (ZOLOFT) 100 MG tablet TAKE 1 TABLET BY MOUTH EVERY EVENING. 90 tablet 1  . SYNTHROID 88 MCG tablet Take 1 tablet (88 mcg total) by mouth daily. 90 tablet 3  . valsartan (DIOVAN) 160 MG tablet Take 1 tablet (160 mg total) by mouth daily. 30 tablet 6  . Vitamin D, Ergocalciferol, (DRISDOL) 1.25 MG (50000 UNIT) CAPS capsule TAKE 1 CAPSULE BY MOUTH ONE TIME PER WEEK 12 capsule 2   No current facility-administered  medications for this visit.    Allergies  Allergen Reactions  . Codeine Itching  . Fish Allergy Itching    Mahi Mahi  . Iohexol Hives  . Pindolol     Avoid non selective beta blockers due to dyspnea  . Iodinated Diagnostic Agents Rash    Has received since rash developed without pre-meds without development of rash.    Past Medical History:  Diagnosis Date  . Anxiety   . Asthma    mild intermittent as of 07/2017 - just when patient gets sick  . Depression   . Female infertility   . GERD (gastroesophageal reflux disease)    Hx - no current problems, no meds  . H/O amaurosis fugax Fall 2012   Resolved, no current problems, Hx-MRI of brain/brain stem: no acute abnormality, small area of encephalomalacia left superior cerebellum that could be prior trauma, infection, or lacunar infarct  . History of Hodgkin's lymphoma 2007   in remission, sees [facility], prior with Dr. Elwanda Brooklyn; prior chemotherapy and radiation therapy; sees yearly in December, no current problems  . Hyperlipidemia 07/2017   diet control, no meds  . Hypothyroidism   . Moderate mitral regurgitation by prior echocardiogram 09/2010   10/2015: EF 55-60%. Normal WM. Normal D Fxn. Bilateral MVP w/ Mod MR (very eccentric). Normal LA, RV & RA. Normal PAP.  Marland Kitchen MVP (mitral valve prolapse) 09/2010   Dr. Ellyn Hack, [facility]; a) Echo 3/'12: Mod MR; EF 60-65%; b) TEE 5/'12: Nl LV Fxn, EF 60-65%, mild, holosystolic prolapse of the medial anterior leaflet. Mod MR . c) Echo 3/'15: Moderate, holosystolic prolapse of  medial. Mod MR d) TM Stress Echo (@ [facility]) Nl Lv Fxn, Mild-Mod MR @ Res0 --> Mod MR at HR 181 bpm (7:22 min, 10.10 METS) w/p WMA  . Oral ulcer   . Palpitations 10/2012   holter monitor, Dr. Ellyn Hack; no arrhythmia, no current problems    There were no vitals taken for this visit.  No problem-specific Assessment & Plan notes found for this encounter.   Abygail Galeno Rodriguez-Guzman PharmD, BCPS, [facility] [address] [place] [address] 10/18/2020 1:22 PM

## 2020-11-02 DIAGNOSIS — M659 Synovitis and tenosynovitis, unspecified: Secondary | ICD-10-CM | POA: Diagnosis not present

## 2020-11-15 DIAGNOSIS — Z01818 Encounter for other preprocedural examination: Secondary | ICD-10-CM | POA: Diagnosis not present

## 2020-11-15 DIAGNOSIS — Z9013 Acquired absence of bilateral breasts and nipples: Secondary | ICD-10-CM | POA: Diagnosis not present

## 2020-11-29 ENCOUNTER — Other Ambulatory Visit: Payer: Self-pay

## 2020-11-29 ENCOUNTER — Encounter: Payer: Self-pay | Admitting: Cardiology

## 2020-11-29 ENCOUNTER — Ambulatory Visit (INDEPENDENT_AMBULATORY_CARE_PROVIDER_SITE_OTHER): Payer: BC Managed Care – PPO | Admitting: Cardiology

## 2020-11-29 VITALS — BP 112/72 | HR 111 | Ht 67.0 in | Wt 162.8 lb

## 2020-11-29 DIAGNOSIS — E785 Hyperlipidemia, unspecified: Secondary | ICD-10-CM | POA: Diagnosis not present

## 2020-11-29 DIAGNOSIS — I341 Nonrheumatic mitral (valve) prolapse: Secondary | ICD-10-CM | POA: Diagnosis not present

## 2020-11-29 DIAGNOSIS — I340 Nonrheumatic mitral (valve) insufficiency: Secondary | ICD-10-CM

## 2020-11-29 DIAGNOSIS — R000 Tachycardia, unspecified: Secondary | ICD-10-CM

## 2020-11-29 DIAGNOSIS — Z01810 Encounter for preprocedural cardiovascular examination: Secondary | ICD-10-CM

## 2020-11-29 DIAGNOSIS — I10 Essential (primary) hypertension: Secondary | ICD-10-CM

## 2020-11-29 NOTE — Patient Instructions (Signed)
Medication Instructions:   No changes  *If you need a refill on your cardiac medications before your next appointment, please call your pharmacy*   Lab Work: No labs     Testing/Procedures:  Will be schedule in April 2023 at [place] has requested that you have an echocardiogram. Echocardiography is a painless test that uses sound waves to create images of your heart. It provides your doctor with information about the size and shape of your heart and how well your heart's chambers and valves are working. This procedure takes approximately one hour. There are no restrictions for this procedure.    Follow-Up: At [facility], you and your health needs are our priority.  As part of our continuing mission to provide you with exceptional heart care, we have created designated Provider Care Teams.  These Care Teams include your primary Cardiologist (physician) and Advanced Practice Providers (APPs -  Physician Assistants and Nurse Practitioners) who all work together to provide you with the care you need, when you need it.     Your next appointment:   11 month(s) April 2022  The format for your next appointment:   In Person  Provider:   Glenetta Hew, MD   Other Instructions  Okay to have surgery on 11/30/20 from cardiac standpoint

## 2020-11-29 NOTE — Progress Notes (Signed)
Primary Care Provider: Denita Lung, MD Cardiologist: Lisa Hew, MD Electrophysiologist: None  Clinic Note: Chief Complaint  Patient presents with  . Follow-up    Feels well.  Doing well.  Has surgery pending.  . Tachycardia    Still has fast heart rate, but not overly symptomatic.  . Mitral Regurgitation    No CHF symptoms.   ===================================  ASSESSMENT/PLAN   Problem List Items Addressed This Visit    Preop cardiovascular exam    Besides having sinus tachycardia, she is stable from a cardiac standpoint.  No further cardiac evaluation required.  No active symptoms.  Breast surgery is low risk from cardiac standpoint, she would be a low risk patient.  Okay to proceed with surgery tomorrow.  Continue beta-blocker.      Relevant Orders   EKG 12-Lead   Moderate mitral regurgitation by prior echocardiogram - Primary (Chronic)    Mild to moderate MR with MVP noted on echo.  2021 echo had no change in 2019.  Recheck again in 2 years which would be April 2023.      Relevant Orders   ECHOCARDIOGRAM COMPLETE   MVP (mitral valve prolapse) (Chronic)    Stable on echo back in April 2021.  Plan recheck echo in April 2023.      Relevant Orders   ECHOCARDIOGRAM COMPLETE   Essential hypertension (Chronic)    Blood pressure doing well.  Was converted from rapid to Toprol because unsuccessful drop in heart rate.  Toprol itself is also not all that effective.  (MD future we would potentially reduce losartan further to allow for titration of Toprol further).      Tachycardia (Chronic)    Resting heart rate still pretty high.  No effect on verapamil, switch to Toprol.  Still tachycardic.  If she remains tachycardic going forward, we could potentially reduce valsartan dose increased Toprol dose.  Continue to push adequate hydration.  If she were to develop signs or symptoms of heart failure, we can consider ivabradine..      Relevant Orders   EKG  12-Lead   Hyperlipidemia with target LDL less than 130 (Chronic)      ===================================  HPI:    Lisa Waters is a 40 y.o. female with a PMH notable for history of Hodgkin's Lymphoma treated with XRT), MVP with moderate MR (stable), recent diagnosis of LEFT-SIDED BREAST CANCER (s/p prophylactic bilateral mastectomy with multiple iterations of reconstruction surgery) who presents today for 39-month follow-up and preop for upcoming breast surgery.Lisa Waters was last seen on August 31, 2018.  She is doing pretty well but her heart rate was quite fast and she was having issues with hypertension.  She feels her heart rate going fast, but denies any lightheadedness or dizziness.  No chest pain pressure.  Really not having any dyspnea.  No sensation of irregular heartbeats or palpitations.  No PND orthopnea or edema. (She had been down in [place] doing a rotational physical therapy procedures to help left breast expansion.  She returned back to have follow-on surgery done for her right breast). -> I initially put her on verapamil, that was switched over to Toprol 50 mg.  Recent Hospitalizations:   10/07/2020: Irrigation, debridement of breast wound.  Reviewed  CV studies:    The following studies were reviewed today: (if available, images/films reviewed: From Epic Chart or Care Everywhere) . None since April 2021:   Interval History:   Lisa Waters returns here today actually in  really good spirits.  She is feeling fine.  She is ready to get error hopefully final surgery done tomorrow.  She went through her recent surgeries without any issues, and is ready to proceed.  She did not do well with verapamil-so this was converted to Toprol by our [facility] pharmacist.  She initially felt fatigued and tired and weak with starting it, but now has tolerating it well.  She still though has heart rates averaging in the 100s.  Her blood pressures, on the other hand are much better  controlled. She is relatively asymptomatic from a cardiac standpoint.  She had tissue expander removal/tissue rearrangement procedure planned for 11/30/2020.   CV Review of Symptoms (Summary): no chest pain or dyspnea on exertion positive for - rapid heart rate negative for - edema, irregular heartbeat, orthopnea, paroxysmal nocturnal dyspnea, shortness of breath or Lightheadedness, dizziness or wooziness, syncope/near syncope or TIA/amaurosis fugax, claudication   REVIEWED OF SYSTEMS   Review of Systems  Constitutional: Negative for malaise/fatigue and weight loss.  HENT: Negative for congestion and nosebleeds.   Respiratory: Negative for shortness of breath.   Cardiovascular: Negative.        Per HPI.  Only notes tachycardia  Gastrointestinal: Negative for blood in stool and melena.  Genitourinary: Negative for hematuria.  Musculoskeletal: Negative for falls and joint pain.       She has right-sided flank discomfort from muscle flaps both umbilicus with dorsi on the right side for her recent surgery.  Other than being little bit sore, she actually is doing fine.  Neurological: Positive for dizziness (Rare).  Psychiatric/Behavioral: Negative for depression and memory loss. The patient is nervous/anxious (But this is been well controlled.). The patient does not have insomnia.    I have reviewed and (if needed) personally updated the patient's problem list, medications, allergies, past medical and surgical history, social and family history.   PAST MEDICAL HISTORY   Past Medical History:  Diagnosis Date  . Anxiety   . Asthma    mild intermittent as of 07/2017 - just when patient gets sick  . Depression   . Female infertility   . GERD (gastroesophageal reflux disease)    Hx - no current problems, no meds  . H/O amaurosis fugax Fall 2012   Resolved, no current problems, Hx-MRI of brain/brain stem: no acute abnormality, small area of encephalomalacia left superior cerebellum that  could be prior trauma, infection, or lacunar infarct  . History of Hodgkin's lymphoma 2007   in remission, sees [facility], prior with Dr. Elwanda Waters; prior chemotherapy and radiation therapy; sees yearly in December, no current problems  . Hyperlipidemia 07/2017   diet control, no meds  . Hypothyroidism   . Moderate mitral regurgitation by prior echocardiogram 09/2010   10/2015: EF 55-60%. Normal WM. Normal D Fxn. Bilateral MVP w/ Mod MR (very eccentric). Normal LA, RV & RA. Normal PAP.  Marland Kitchen MVP (mitral valve prolapse) 09/2010   Dr. Ellyn Hack, [facility]; a) Echo 3/'12: Mod MR; EF 60-65%; b) TEE 5/'12: Nl LV Fxn, EF 60-65%, mild, holosystolic prolapse of the medial anterior leaflet. Mod MR . c) Echo 3/'15: Moderate, holosystolic prolapse of  medial. Mod MR d) TM Stress Echo (@ [facility]) Nl Lv Fxn, Mild-Mod MR @ Res0 --> Mod MR at HR 181 bpm (7:22 min, 10.10 METS) w/p WMA  . Oral ulcer   . Palpitations 10/2012   holter monitor, Dr. Ellyn Hack; no arrhythmia, no current problems    PAST SURGICAL HISTORY   Past  Surgical History:  Procedure Laterality Date  . APPENDECTOMY  1993  . BREAST ENHANCEMENT SURGERY  2002  . COLONOSCOPY  07/2014   Dr. Collene Mares - normal  . DILATATION & CURETTAGE/HYSTEROSCOPY WITH MYOSURE N/A 01/29/2018   Procedure: DILATATION & CURETTAGE/HYSTEROSCOPY WITH MYOSURE POLYPECTOMY;  Surgeon: Charyl Bigger, MD;  Location: [facility];  Service: Gynecology;  Laterality: N/A;  . ESOPHAGOGASTRODUODENOSCOPY  04/11/13   [facility] Dr. Collene Mares  . FOOT SURGERY Bilateral    on toes  . INSERTION CENTRAL VENOUS ACCESS DEVICE W/ SUBCUTANEOUS PORT  2006  . LYMPH NODE BIOPSY  2006   under right arm  . NM MYOCAR PERF WALL MOTION  12/2010   persantine myoview - moderate breast attenuation (fixed mid-distal anterior defect), EF 68%, low risk scan  . RHINOPLASTY  2007   deviated septum  . TEE WITHOUT CARDIOVERSION  11/2010   Nl LV Fxn, EF 60-65%, mild, holosystolic prolapse of the medial anterior  leaflet. Mod MR .   Marland Kitchen TRANSTHORACIC ECHOCARDIOGRAM  3/'14; 3/'15   LVEF 60-65%, wall motion normal, LV function normal, moderate holosystolic MVP (medial the middle scallop of the posterior leaflet), moderate regurgitation (directed posteriorly), no shunt -- stable over 2 years  . TRANSTHORACIC ECHOCARDIOGRAM  10/2015   EF 55-60%. Normal WM. Normal D Fxn. Bilateral MVP w/ Mod MR (very eccentric). Normal LA, RV & RA. Normal PAP.  Marland Kitchen TRANSTHORACIC ECHOCARDIOGRAM  10/22/2019   EF 55 to 60%.  Mild LVH.  GRII DD.  Elevated LAP.  Mild LA dilation.  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  Normal aortic valve.  (Stable  . WISDOM TOOTH EXTRACTION      Immunization History  Administered Date(s) Administered  . Influenza Split 05/20/2012  . Influenza,inj,Quad PF,6+ Mos 04/07/2013  . PPD Test 03/04/2013  . Tdap 04/01/2015    MEDICATIONS/ALLERGIES   No outpatient medications have been marked as taking for the 11/29/20 encounter (Office Visit) with Leonie Man, MD.    Allergies  Allergen Reactions  . Codeine Itching  . Fish Allergy Itching    Mahi Mahi  . Iohexol Hives  . Pindolol     Avoid non selective beta blockers due to dyspnea  . Iodinated Diagnostic Agents Rash    Has received since rash developed without pre-meds without development of rash.    SOCIAL HISTORY/FAMILY HISTORY   Reviewed in Epic:  Pertinent findings:  Social History   Tobacco Use  . Smoking status: Former Smoker    Packs/day: 1.00    Years: 10.00    Pack years: 10.00    Types: Cigarettes    Quit date: 11/07/2009    Years since quitting: 11.0  . Smokeless tobacco: Never Used  Vaping Use  . Vaping Use: Never used  Substance Use Topics  . Alcohol use: Yes    Alcohol/week: 1.0 - 2.0 standard drink    Types: 1 - 2 Cans of beer per week    Comment: social Beer/Liquor  . Drug use: No   Social History   Social History Narrative   Married, 0 children, has failed fertility treatment & has given up attempts at  IVF as well as adoption. 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   Formerly was Office manager at [employer], exercise - active in the business,  Architect.  07/2017    OBJCTIVE -PE, EKG, labs   Wt Readings from Last 3 Encounters:  11/29/20 162 lb 12.8 oz (73.8 kg)  09/20/20 170 lb (  77.1 kg)  08/31/20 170 lb (77.1 kg)    Physical Exam: BP 112/72   Pulse (!) 111   Ht 5\' 7"  (1.702 m)   Wt 162 lb 12.8 oz (73.8 kg)   SpO2 99%   BMI 25.50 kg/m  Physical Exam Constitutional:      General: She is not in acute distress.    Appearance: Normal appearance. She is normal weight. She is not ill-appearing or toxic-appearing.     Comments: Healthy-appearing  Neck:     Vascular: No carotid bruit, hepatojugular reflux or JVD.  Cardiovascular:     Rate and Rhythm: Regular rhythm. Tachycardia present.     Pulses: Normal pulses. A midsystolic click (Difficult assess with tachycardia,).     Heart sounds: Murmur heard.  High-pitched blowing crescendo-decrescendo holosystolic murmur is present with a grade of 1/6 at the apex. No friction rub. No gallop.   Pulmonary:     Effort: Pulmonary effort is normal. No respiratory distress.     Breath sounds: Normal breath sounds. No wheezing, rhonchi or rales.  Chest:     Chest wall: Tenderness (On the right breast and flank) present.  Musculoskeletal:        General: No swelling. Normal range of motion.     Cervical back: Normal range of motion and neck supple.  Skin:    General: Skin is warm and dry.  Neurological:     General: No focal deficit present.     Mental Status: She is alert and oriented to person, place, and time.  Psychiatric:        Mood and Affect: Mood normal.        Behavior: Behavior normal.        Thought Content: Thought content normal.        Judgment: Judgment normal.     Adult ECG Report  Rate: 111 ;  Rhythm: sinus tachycardia and Otherwise normal axis, intervals durations.;   Narrative  Interpretation: Stable  Recent Labs: Reviewed Lab Results  Component Value Date   CHOL 273 (H) 09/17/2020   HDL 97 09/17/2020   LDLCALC 165 (H) 09/17/2020   TRIG 72 09/17/2020   CHOLHDL 2.8 09/17/2020   Lab Results  Component Value Date   CREATININE 0.90 09/17/2020   BUN 9 09/17/2020   NA 131 (L) 09/17/2020   K 4.6 09/17/2020   CL 93 (L) 09/17/2020   CO2 20 09/17/2020   CBC Latest Ref Rng & Units 02/26/2019 01/23/2018 07/26/2017  WBC 3.4 - 10.8 x10E3/uL 4.4 6.2 4.8  Hemoglobin 11.1 - 15.9 g/dL 54.013.4 98.112.4 19.113.2  Hematocrit 34.0 - 46.6 % 38.8 36.9 38.6  Platelets 150 - 450 x10E3/uL 255 193 306    Lab Results  Component Value Date   TSH 7.420 (H) 02/26/2019    ================================================== I spent a total of 15minutes with the patient spent in direct patient consultation.  Additional time spent with chart review  / charting (studies, outside notes, etc): 10 min Total Time: 25 min   Current medicines are reviewed at length with the patient today.  (+/- concerns) n/a  This visit occurred during the SARS-CoV-2 public health emergency.  Safety protocols were in place, including screening questions prior to the visit, additional usage of staff PPE, and extensive cleaning of exam room while observing appropriate contact time as indicated for disinfecting solutions.  Notice: This dictation was prepared with Dragon dictation along with smaller phrase technology. Any transcriptional errors that result from this process are unintentional and  may not be corrected upon review.  Patient Instructions / Medication Changes & Studies & Tests Ordered   Patient Instructions  Medication Instructions:   No changes  *If you need a refill on your cardiac medications before your next appointment, please call your pharmacy*   Lab Work: No labs     Testing/Procedures:  Will be schedule in April 2023 at [facility] has requested  that you have an echocardiogram. Echocardiography is a painless test that uses sound waves to create images of your heart. It provides your doctor with information about the size and shape of your heart and how well your heart's chambers and valves are working. This procedure takes approximately one hour. There are no restrictions for this procedure.    Follow-Up: At [facility], you and your health needs are our priority.  As part of our continuing mission to provide you with exceptional heart care, we have created designated Provider Care Teams.  These Care Teams include your primary Cardiologist (physician) and Advanced Practice Providers (APPs -  Physician Assistants and Nurse Practitioners) who all work together to provide you with the care you need, when you need it.     Your next appointment:   11 month(s) April 2022  The format for your next appointment:   In Person  Provider:   Glenetta Hew, MD   Other Instructions  Okay to have surgery on 11/30/20 from cardiac standpoint    Studies Ordered:   Orders Placed This Encounter  Procedures  . EKG 12-Lead  . ECHOCARDIOGRAM COMPLETE     Lisa Waters, M.D., M.S. Interventional Cardiologist   Pager # [phone] Phone # [phone] [address]. [place], [place] [address]   Thank you for choosing Heartcare at [facility]!!

## 2020-11-30 ENCOUNTER — Encounter: Payer: Self-pay | Admitting: Cardiology

## 2020-11-30 DIAGNOSIS — F32A Depression, unspecified: Secondary | ICD-10-CM | POA: Diagnosis not present

## 2020-11-30 DIAGNOSIS — F419 Anxiety disorder, unspecified: Secondary | ICD-10-CM | POA: Diagnosis not present

## 2020-11-30 DIAGNOSIS — Z45811 Encounter for adjustment or removal of right breast implant: Secondary | ICD-10-CM | POA: Diagnosis not present

## 2020-11-30 DIAGNOSIS — S20151A Superficial foreign body of breast, right breast, initial encounter: Secondary | ICD-10-CM | POA: Diagnosis not present

## 2020-11-30 DIAGNOSIS — Z421 Encounter for breast reconstruction following mastectomy: Secondary | ICD-10-CM | POA: Diagnosis not present

## 2020-11-30 DIAGNOSIS — Z79899 Other long term (current) drug therapy: Secondary | ICD-10-CM | POA: Diagnosis not present

## 2020-11-30 DIAGNOSIS — N651 Disproportion of reconstructed breast: Secondary | ICD-10-CM | POA: Diagnosis not present

## 2020-11-30 DIAGNOSIS — I10 Essential (primary) hypertension: Secondary | ICD-10-CM | POA: Diagnosis not present

## 2020-11-30 DIAGNOSIS — Z87891 Personal history of nicotine dependence: Secondary | ICD-10-CM | POA: Diagnosis not present

## 2020-11-30 DIAGNOSIS — N6031 Fibrosclerosis of right breast: Secondary | ICD-10-CM | POA: Diagnosis not present

## 2020-11-30 DIAGNOSIS — Z9013 Acquired absence of bilateral breasts and nipples: Secondary | ICD-10-CM | POA: Diagnosis not present

## 2020-11-30 DIAGNOSIS — M659 Synovitis and tenosynovitis, unspecified: Secondary | ICD-10-CM | POA: Diagnosis not present

## 2020-11-30 DIAGNOSIS — Z9221 Personal history of antineoplastic chemotherapy: Secondary | ICD-10-CM | POA: Diagnosis not present

## 2020-11-30 DIAGNOSIS — E039 Hypothyroidism, unspecified: Secondary | ICD-10-CM | POA: Diagnosis not present

## 2020-11-30 DIAGNOSIS — Z923 Personal history of irradiation: Secondary | ICD-10-CM | POA: Diagnosis not present

## 2020-11-30 DIAGNOSIS — M25562 Pain in left knee: Secondary | ICD-10-CM | POA: Diagnosis not present

## 2020-11-30 DIAGNOSIS — Z8572 Personal history of non-Hodgkin lymphomas: Secondary | ICD-10-CM | POA: Diagnosis not present

## 2020-11-30 DIAGNOSIS — Z85118 Personal history of other malignant neoplasm of bronchus and lung: Secondary | ICD-10-CM | POA: Diagnosis not present

## 2020-11-30 NOTE — Assessment & Plan Note (Signed)
Stable on echo back in April 2021.  Plan recheck echo in April 2023.

## 2020-11-30 NOTE — Assessment & Plan Note (Addendum)
Blood pressure doing well.  Was converted from rapid to Toprol because unsuccessful drop in heart rate.  Toprol itself is also not all that effective.  (MD future we would potentially reduce losartan further to allow for titration of Toprol further).

## 2020-11-30 NOTE — Assessment & Plan Note (Addendum)
Resting heart rate still pretty high.  No effect on verapamil, switch to Toprol.  Still tachycardic.  If she remains tachycardic going forward, we could potentially reduce valsartan dose increased Toprol dose.  Continue to push adequate hydration.  If she were to develop signs or symptoms of heart failure, we can consider ivabradine.Marland Kitchen

## 2020-11-30 NOTE — Assessment & Plan Note (Signed)
Mild to moderate MR with MVP noted on echo.  2021 echo had no change in 2019.  Recheck again in 2 years which would be April 2023.

## 2020-11-30 NOTE — Assessment & Plan Note (Signed)
Besides having sinus tachycardia, she is stable from a cardiac standpoint.  No further cardiac evaluation required.  No active symptoms.  Breast surgery is low risk from cardiac standpoint, she would be a low risk patient.  Okay to proceed with surgery tomorrow.  Continue beta-blocker.

## 2020-12-06 DIAGNOSIS — S21001A Unspecified open wound of right breast, initial encounter: Secondary | ICD-10-CM | POA: Diagnosis not present

## 2020-12-06 DIAGNOSIS — T8131XA Disruption of external operation (surgical) wound, not elsewhere classified, initial encounter: Secondary | ICD-10-CM | POA: Diagnosis not present

## 2020-12-07 DIAGNOSIS — D519 Vitamin B12 deficiency anemia, unspecified: Secondary | ICD-10-CM | POA: Diagnosis not present

## 2020-12-07 DIAGNOSIS — D649 Anemia, unspecified: Secondary | ICD-10-CM | POA: Diagnosis not present

## 2020-12-07 DIAGNOSIS — Z923 Personal history of irradiation: Secondary | ICD-10-CM | POA: Diagnosis not present

## 2020-12-07 DIAGNOSIS — C8190 Hodgkin lymphoma, unspecified, unspecified site: Secondary | ICD-10-CM | POA: Diagnosis not present

## 2020-12-08 ENCOUNTER — Other Ambulatory Visit: Payer: Self-pay | Admitting: Family Medicine

## 2020-12-08 NOTE — Telephone Encounter (Signed)
Ok to refill 

## 2020-12-18 ENCOUNTER — Other Ambulatory Visit: Payer: Self-pay | Admitting: Cardiology

## 2020-12-28 DIAGNOSIS — M2242 Chondromalacia patellae, left knee: Secondary | ICD-10-CM | POA: Diagnosis not present

## 2021-01-08 ENCOUNTER — Telehealth: Payer: BC Managed Care – PPO | Admitting: Orthopedic Surgery

## 2021-01-08 DIAGNOSIS — L259 Unspecified contact dermatitis, unspecified cause: Secondary | ICD-10-CM

## 2021-01-08 MED ORDER — PREDNISONE 10 MG PO TABS: ORAL_TABLET | ORAL | 0 refills | Status: AC

## 2021-01-08 NOTE — Progress Notes (Signed)
E Visit for Rash  We are sorry that you are not feeling well. Here is how we plan to help!  Based on what you shared with me it looks like you have contact dermatitis.  Contact dermatitis is a skin rash caused by something that touches the skin and causes irritation or inflammation.  Your skin may be red, swollen, dry, cracked, and itch.  The rash should go away in a few days but can last a few weeks.  If you get a rash, it's important to figure out what caused it so the irritant can be avoided in the future. and I am prescribing a two week course of steroids (37 tablets of 10 mg prednisone).  Days 1-4 take 4 tablets (40 mg) daily  Days 5-8 take 3 tablets (30 mg) daily, Days 9-11 take 2 tablets (20 mg) daily, Days 12-14 take 1 tablet (10 mg) daily.    HOME CARE:  Take cool showers and avoid direct sunlight. Apply cool compress or wet dressings. Take a bath in an oatmeal bath.  Sprinkle content of one Aveeno packet under running faucet with comfortably warm water.  Bathe for 15-20 minutes, 1-2 times daily.  Pat dry with a towel. Do not rub the rash. Use hydrocortisone cream. Take an antihistamine like Benadryl for widespread rashes that itch.  The adult dose of Benadryl is 25-50 mg by mouth 4 times daily. Caution:  This type of medication may cause sleepiness.  Do not drink alcohol, drive, or operate dangerous machinery while taking antihistamines.  Do not take these medications if you have prostate enlargement.  Read package instructions thoroughly on all medications that you take.  GET HELP RIGHT AWAY IF:  Symptoms don't go away after treatment. Severe itching that persists. If you rash spreads or swells. If you rash begins to smell. If it blisters and opens or develops a yellow-brown crust. You develop a fever. You have a sore throat. You become short of breath.  MAKE SURE YOU:  Understand these instructions. Will watch your condition. Will get help right away if you are not doing  well or get worse.  Thank you for choosing an e-visit.  Your e-visit answers were reviewed by a board certified advanced clinical practitioner to complete your personal care plan. Depending upon the condition, your plan could have included both over the counter or prescription medications.  Please review your pharmacy choice. Make sure the pharmacy is open so you can pick up prescription now. If there is a problem, you may contact your provider through [employer] and have the prescription routed to another pharmacy.  Your safety is important to [place]. If you have drug allergies check your prescription carefully.   For the next 24 hours you can use MyChart to ask questions about today's visit, request a non-urgent call back, or ask for a work or school excuse. You will get an email in the next two days asking about your experience. I hope that your e-visit has been valuable and will speed your recovery.  Greater than 5 minutes, yet less than 10 minutes of time have been spent researching, coordinating and implementing care for this patient today.

## 2021-01-30 ENCOUNTER — Other Ambulatory Visit: Payer: Self-pay | Admitting: Family Medicine

## 2021-02-14 NOTE — Telephone Encounter (Signed)
Pt requesting Vitamin D refill  Scheduled CPE for 08/23/21

## 2021-02-22 DIAGNOSIS — N6489 Other specified disorders of breast: Secondary | ICD-10-CM | POA: Diagnosis not present

## 2021-02-22 DIAGNOSIS — Z87891 Personal history of nicotine dependence: Secondary | ICD-10-CM | POA: Diagnosis not present

## 2021-02-22 DIAGNOSIS — Z421 Encounter for breast reconstruction following mastectomy: Secondary | ICD-10-CM | POA: Diagnosis not present

## 2021-02-22 DIAGNOSIS — Z853 Personal history of malignant neoplasm of breast: Secondary | ICD-10-CM | POA: Diagnosis not present

## 2021-02-22 DIAGNOSIS — Z9013 Acquired absence of bilateral breasts and nipples: Secondary | ICD-10-CM | POA: Diagnosis not present

## 2021-02-23 ENCOUNTER — Other Ambulatory Visit: Payer: Self-pay | Admitting: Family Medicine

## 2021-02-23 DIAGNOSIS — F411 Generalized anxiety disorder: Secondary | ICD-10-CM

## 2021-02-23 NOTE — Telephone Encounter (Signed)
Is this okay to refill? 

## 2021-02-24 NOTE — Telephone Encounter (Signed)
Sent my chart message to find out id pt needs this med. [place]

## 2021-02-28 ENCOUNTER — Other Ambulatory Visit: Payer: Self-pay | Admitting: Family Medicine

## 2021-02-28 NOTE — Telephone Encounter (Signed)
[facility] is requesting to fill pt xanax. Please advise [facility] 

## 2021-03-07 ENCOUNTER — Telehealth: Payer: BC Managed Care – PPO | Admitting: Family

## 2021-03-07 DIAGNOSIS — M546 Pain in thoracic spine: Secondary | ICD-10-CM

## 2021-03-07 MED ORDER — BACLOFEN 10 MG PO TABS: 10.0000 mg | ORAL_TABLET | Freq: Three times a day (TID) | ORAL | 0 refills | Status: DC

## 2021-03-07 MED ORDER — NAPROXEN 500 MG PO TABS: 500.0000 mg | ORAL_TABLET | Freq: Two times a day (BID) | ORAL | 0 refills | Status: DC

## 2021-03-07 NOTE — Progress Notes (Signed)

## 2021-03-09 DIAGNOSIS — C8190 Hodgkin lymphoma, unspecified, unspecified site: Secondary | ICD-10-CM | POA: Diagnosis not present

## 2021-03-09 DIAGNOSIS — D519 Vitamin B12 deficiency anemia, unspecified: Secondary | ICD-10-CM | POA: Diagnosis not present

## 2021-03-11 ENCOUNTER — Telehealth: Payer: BC Managed Care – PPO | Admitting: Physician Assistant

## 2021-03-11 DIAGNOSIS — L255 Unspecified contact dermatitis due to plants, except food: Secondary | ICD-10-CM

## 2021-03-11 MED ORDER — PREDNISONE 10 MG PO TABS: ORAL_TABLET | ORAL | 0 refills | Status: DC

## 2021-03-11 NOTE — Progress Notes (Signed)
E-Visit for Poison Ivy  We are sorry that you are not feeing well.  Here is how we plan to help!  Based on what you have shared with me it looks like you have had an allergic reaction to the oily resin from a group of plants.  This resin is very sticky, so it easily attaches to your skin, clothing, tools equipment, and pet's fur.    This blistering rash is often called poison ivy rash although it can come from contact with the leaves, stems and roots of poison ivy, poison oak and poison sumac.  The oily resin contains urushiol (u-ROO-she-ol) that produces a skin rash on exposed skin.  The severity of the rash depends on the amount of urushiol that gets on your skin.  A section of skin with more urushiol on it may develop a rash sooner.  The rash usually develops 12-48 hours after exposure and can last two to three weeks.  Your skin must come in direct contact with the plant's oil to be affected.  Blister fluid doesn't spread the rash.  However, if you come into contact with a piece of clothing or pet fur that has urushiol on it, the rash may spread out.  You can also transfer the oil to other parts of your body with your fingers.  Often the rash looks like a straight line because of the way the plant brushes against your skin.  Since your rash is widespread or has resulted in a large number of blisters, I have prescribed an oral corticosteroid.  Please follow these recommendations:  I have sent a prednisone dose pack to your chosen pharmacy. Be sure to follow the instructions carefully and complete the entire prescription. You may use Benadryl or Caladryl topical lotions to sooth the itch and remember cool, not hot, showers and baths can help relieve the itching!  Place cool, wet compresses on the affected area for 15-30 minutes several times a day.  You may also take oral antihistamines, such as diphenhydramine (Benadryl, others), which may also help you sleep better.  Watch your skin for any purulent  (pus) drainage or red streaking from the site.  If this occurs, contact your provider.  You may require an antibiotic for a skin infection.  Make sure that the clothes you were wearing as well as any towels or sheets that may have come in contact with the oil (urushiol) are washed in detergent and hot water.       I have developed the following plan to treat your condition I am prescribing a two week course of steroids (37 tablets of 10 mg prednisone).  Days 1-4 take 4 tablets (40 mg) daily  Days 5-8 take 3 tablets (30 mg) daily, Days 9-11 take 2 tablets (20 mg) daily, Days 12-14 take 1 tablet (10 mg) daily.    What can you do to prevent this rash?  Avoid the plants.  Learn how to identify poison ivy, poison oak and poison sumac in all seasons.  When hiking or engaging in other activities that might expose you to these plants, try to stay on cleared pathways.  If camping, make sure you pitch your tent in an area free of these plants.  Keep pets from running through wooded areas so that urushiol doesn't accidentally stick to their fur, which you may touch.  Remove or kill the plants.  In your yard, you can get rid of poison ivy by applying an herbicide or pulling it out of   the ground, including the roots, while wearing heavy gloves.  Afterward remove the gloves and thoroughly wash them and your hands.  Don't burn poison ivy or related plants because the urushiol can be carried by smoke.  Wear protective clothing.  If needed, protect your skin by wearing socks, boots, pants, long sleeves and vinyl gloves.  Wash your skin right away.  Washing off the oil with soap and water within 30 minutes of exposure may reduce your chances of getting a poison ivy rash.  Even washing after an hour or so can help reduce the severity of the rash.  If you walk through some poison ivy and then later touch your shoes, you may get some urushiol on your hands, which may then transfer to your face or body by touching or  rubbing.  If the contaminated object isn't cleaned, the urushiol on it can still cause a skin reaction years later.    Be careful not to reuse towels after you have washed your skin.  Also carefully wash clothing in detergent and hot water to remove all traces of the oil.  Handle contaminated clothing carefully so you don't transfer the urushiol to yourself, furniture, rugs or appliances.  Remember that pets can carry the oil on their fur and paws.  If you think your pet may be contaminated with urushiol, put on some long rubber gloves and give your pet a bath.  Finally, be careful not to burn these plants as the smoke can contain traces of the oil.  Inhaling the smoke may result in difficulty breathing. If that occurred you should see a physician as soon as possible.  See your doctor right away if:  The reaction is severe or widespread You inhaled the smoke from burning poison ivy and are having difficulty breathing Your skin continues to swell The rash affects your eyes, mouth or genitals Blisters are oozing pus You develop a fever greater than 100 F (37.8 C) The rash doesn't get better within a few weeks.  If you scratch the poison ivy rash, bacteria under your fingernails may cause the skin to become infected.  See your doctor if pus starts oozing from the blisters.  Treatment generally includes antibiotics.  Poison ivy treatments are usually limited to self-care methods.  And the rash typically goes away on its own in two to three weeks.     If the rash is widespread or results in a large number of blisters, your doctor may prescribe an oral corticosteroid, such as prednisone.  If a bacterial infection has developed at the rash site, your doctor may give you a prescription for an oral antibiotic.  MAKE SURE YOU  Understand these instructions. Will watch your condition. Will get help right away if you are not doing well or get worse.   Thank you for choosing an e-visit.  Your  e-visit answers were reviewed by a board certified advanced clinical practitioner to complete your personal care plan. Depending upon the condition, your plan could have included both over the counter or prescription medications.  Please review your pharmacy choice. Make sure the pharmacy is open so you can pick up prescription now. If there is a problem, you may contact your provider through MyChart messaging and have the prescription routed to another pharmacy.  Your safety is important to us. If you have drug allergies check your prescription carefully.   For the next 24 hours you can use MyChart to ask questions about today's visit, request a non-urgent   call back, or ask for a work or school excuse. You will get an email in the next two days asking about your experience. I hope that your e-visit has been valuable and will speed your recovery.   I provided 6 minutes of non face-to-face time during this encounter for chart review and documentation.

## 2021-03-23 ENCOUNTER — Other Ambulatory Visit: Payer: Self-pay | Admitting: Family Medicine

## 2021-03-23 DIAGNOSIS — E038 Other specified hypothyroidism: Secondary | ICD-10-CM

## 2021-03-25 DIAGNOSIS — Z9889 Other specified postprocedural states: Secondary | ICD-10-CM | POA: Diagnosis not present

## 2021-03-25 DIAGNOSIS — C8190 Hodgkin lymphoma, unspecified, unspecified site: Secondary | ICD-10-CM | POA: Diagnosis not present

## 2021-03-25 DIAGNOSIS — N651 Disproportion of reconstructed breast: Secondary | ICD-10-CM | POA: Diagnosis not present

## 2021-03-25 DIAGNOSIS — D649 Anemia, unspecified: Secondary | ICD-10-CM | POA: Diagnosis not present

## 2021-03-29 ENCOUNTER — Encounter: Payer: Self-pay | Admitting: Family Medicine

## 2021-03-29 ENCOUNTER — Other Ambulatory Visit: Payer: Self-pay | Admitting: Family Medicine

## 2021-03-29 ENCOUNTER — Ambulatory Visit
Admission: RE | Admit: 2021-03-29 | Discharge: 2021-03-29 | Disposition: A | Discharge status: Home / Self Care | Payer: BC Managed Care – PPO | Source: Ambulatory Visit | Attending: Family Medicine | Admitting: Family Medicine

## 2021-03-29 ENCOUNTER — Ambulatory Visit: Payer: BC Managed Care – PPO | Admitting: Family Medicine

## 2021-03-29 ENCOUNTER — Other Ambulatory Visit: Payer: Self-pay

## 2021-03-29 VITALS — BP 128/88 | HR 89 | Temp 97.2°F | Wt 169.2 lb

## 2021-03-29 DIAGNOSIS — M546 Pain in thoracic spine: Secondary | ICD-10-CM | POA: Diagnosis not present

## 2021-03-29 DIAGNOSIS — M542 Cervicalgia: Secondary | ICD-10-CM

## 2021-03-29 DIAGNOSIS — R079 Chest pain, unspecified: Secondary | ICD-10-CM | POA: Diagnosis not present

## 2021-03-29 DIAGNOSIS — Z923 Personal history of irradiation: Secondary | ICD-10-CM | POA: Diagnosis not present

## 2021-03-29 DIAGNOSIS — M47814 Spondylosis without myelopathy or radiculopathy, thoracic region: Secondary | ICD-10-CM | POA: Diagnosis not present

## 2021-03-29 DIAGNOSIS — M419 Scoliosis, unspecified: Secondary | ICD-10-CM | POA: Diagnosis not present

## 2021-03-29 MED ORDER — DICLOFENAC SODIUM 75 MG PO TBEC: 75.0000 mg | DELAYED_RELEASE_TABLET | Freq: Two times a day (BID) | ORAL | 0 refills | Status: DC

## 2021-03-29 NOTE — Patient Instructions (Signed)
Heat to your neck and upper back for 20 minutes 3 times per day and gentle stretching after that.  Switch to Voltaren as well as a baclofen.  Use the hydrocodone if you need to at night for sleep

## 2021-03-29 NOTE — Progress Notes (Signed)
   Subjective:    Patient ID: Lisa Waters, female    DOB: 09-26-1980, 40 y.o.   MRN: [id number]  HPI She complains of a 1 month history of neck and upper thoracic spine discomfort.  Recently she has had several surgeries secondary to breast surgery and reconstruction.  She also has a previous history of Hodgkin's lymphoma.  She did have an ED visit and was given baclofen and Naprosyn which has not really helped her pain.  She has no numbness, tingling or weakness in her arms.  No pain on motion of the neck.   Review of Systems     Objective:   Physical Exam Alert and in no distress.  Full motion of the neck without pain.  No palpable tenderness.  Spurling test negative.  Normal motor, sensory and DTRs.       Assessment & Plan:  Neck pain - Plan: DG Cervical Spine Complete, diclofenac (VOLTAREN) 75 MG EC tablet  Thoracic spine pain - Plan: diclofenac (VOLTAREN) 75 MG EC tablet, CANCELED: DG Thoracic Spine 2 View  History of radiation therapy I explained that they would think the x-ray will come out negative and if so I will refer to physical therapy.  We will also switch to Voltaren to see if that will help with her pain relief and if not may need to try her on tramadol.  Did recommend she try the codeine that she has at home for her nighttime pain to help with sleep.

## 2021-03-31 ENCOUNTER — Other Ambulatory Visit: Payer: Self-pay

## 2021-03-31 DIAGNOSIS — M546 Pain in thoracic spine: Secondary | ICD-10-CM

## 2021-03-31 DIAGNOSIS — M542 Cervicalgia: Secondary | ICD-10-CM

## 2021-04-04 ENCOUNTER — Encounter: Payer: Self-pay | Admitting: Physical Therapy

## 2021-04-04 ENCOUNTER — Ambulatory Visit (INDEPENDENT_AMBULATORY_CARE_PROVIDER_SITE_OTHER): Payer: BC Managed Care – PPO | Admitting: Physical Therapy

## 2021-04-04 ENCOUNTER — Other Ambulatory Visit: Payer: Self-pay

## 2021-04-04 DIAGNOSIS — M6281 Muscle weakness (generalized): Secondary | ICD-10-CM | POA: Diagnosis not present

## 2021-04-04 DIAGNOSIS — M542 Cervicalgia: Secondary | ICD-10-CM | POA: Diagnosis not present

## 2021-04-04 DIAGNOSIS — M546 Pain in thoracic spine: Secondary | ICD-10-CM

## 2021-04-04 NOTE — Therapy (Signed)
[facility] Physical Therapy [address] [place], [place], [address] Phone: [phone]   Fax:  [phone]  Physical Therapy Evaluation  Patient Details  Name: Lisa Waters MRN: [id number] Date of Birth: 12-27-1980 Referring Provider (PT): Jill Alexanders MD   Encounter Date: 04/04/2021   PT End of Session - 04/04/21 1245     Visit Number 1    Number of Visits 10    Date for PT Re-Evaluation 06/17/21    PT Start Time 0845    PT Stop Time 0925    PT Time Calculation (min) 40 min    Activity Tolerance Patient tolerated treatment well    Behavior During Therapy [facility] for tasks assessed/performed             Past Medical History:  Diagnosis Date   Anxiety    Asthma    mild intermittent as of 07/2017 - just when patient gets sick   Depression    Female infertility    GERD (gastroesophageal reflux disease)    Hx - no current problems, no meds   H/O amaurosis fugax Fall 2012   Resolved, no current problems, Hx-MRI of brain/brain stem: no acute abnormality, small area of encephalomalacia left superior cerebellum that could be prior trauma, infection, or lacunar infarct   History of Hodgkin's lymphoma 2007   in remission, sees [facility], prior with Dr. Elwanda [place]; prior chemotherapy and radiation therapy; sees yearly in December, no current problems   Hyperlipidemia 07/2017   diet control, no meds   Hypothyroidism    Moderate mitral regurgitation by prior echocardiogram 09/2010   10/2015: EF 55-60%. Normal WM. Normal D Fxn. Bilateral MVP w/ Mod MR (very eccentric). Normal LA, RV & RA. Normal PAP.   MVP (mitral valve prolapse) 09/2010   Dr. Ellyn Hack, [facility]; a) Echo 3/'12: Mod MR; EF 60-65%; b) TEE 5/'12: Nl LV Fxn, EF 60-65%, mild, holosystolic prolapse of the medial anterior leaflet. Mod MR . c) Echo 3/'15: Moderate, holosystolic prolapse of  medial. Mod MR d) TM Stress Echo (@ [place]) Nl Lv Fxn, Mild-Mod MR @ Res0 --> Mod MR at HR 181 bpm (7:22 min, 10.10 METS) w/p  WMA   Oral ulcer    Palpitations 10/2012   holter monitor, Dr. Ellyn Hack; no arrhythmia, no current problems    Past Surgical History:  Procedure Laterality Date   [place]  2002   COLONOSCOPY  07/2014   Dr. Collene Mares - normal   DILATATION & CURETTAGE/HYSTEROSCOPY WITH MYOSURE N/A 01/29/2018   Procedure: [place] POLYPECTOMY;  Surgeon: Charyl Bigger, MD;  Location: [facility];  Service: Gynecology;  Laterality: N/A;   ESOPHAGOGASTRODUODENOSCOPY  04/11/13   [facility] Dr. Collene Mares   FOOT SURGERY Bilateral    on toes   INSERTION CENTRAL VENOUS ACCESS DEVICE W/ SUBCUTANEOUS PORT  2006   LYMPH NODE BIOPSY  2006   under right arm   NM MYOCAR PERF WALL MOTION  12/2010   persantine myoview - moderate breast attenuation (fixed mid-distal anterior defect), EF 68%, low risk scan   RHINOPLASTY  2007   deviated septum   TEE WITHOUT CARDIOVERSION  11/2010   Nl LV Fxn, EF 60-65%, mild, holosystolic prolapse of the medial anterior leaflet. Mod MR .    TRANSTHORACIC ECHOCARDIOGRAM  3/'14; 3/'15   LVEF 60-65%, wall motion normal, LV function normal, moderate holosystolic MVP (medial the middle scallop of the posterior leaflet), moderate regurgitation (directed posteriorly), no shunt --  stable over 2 years   TRANSTHORACIC ECHOCARDIOGRAM  10/2015   EF 55-60%. Normal WM. Normal D Fxn. Bilateral MVP w/ Mod MR (very eccentric). Normal LA, RV & RA. Normal PAP.   TRANSTHORACIC ECHOCARDIOGRAM  10/22/2019   EF 55 to 60%.  Mild LVH.  GRII DD.  Elevated LAP.  Mild LA dilation.  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  Normal aortic valve.  (Stable   WISDOM TOOTH EXTRACTION      There were no vitals filed for this visit.    Subjective Assessment - 04/04/21 0852     Subjective Pt arriving to therapy reporting neck pain that began about 1 month ago. Pt reporting waking with a "shooting" pain one morning.Pt reporting her pain causes  migraine like headaches.  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    Pertinent History double masectomy, 9-10 surgeries due to breast surgeries,anxiety, asthma, depression, h/o amaurosis fugas, Hodgkin's Lymphoma, hypothoidism, hyperlipidemia, MVP    Limitations Sitting    Patient Stated Goals Stop hurting so I can work pain free    Currently in Pain? Yes    Pain Score 4     Pain Location Neck    Pain Descriptors / Indicators Aching    Pain Type Acute pain    Pain Onset 1 to 4 weeks ago    Pain Frequency Constant    Aggravating Factors  working at computer    Pain Relieving Factors heat, pain meds, muscle relaxers    Effect of Pain on Daily Activities difficulty working                [facility] PT Assessment - 04/04/21 0001       Assessment   Medical Diagnosis cervicalgia M54.2, M54.6 throacic pain    Referring Provider (PT) Jill Alexanders MD    Hand Dominance Right    Prior Therapy yes, after shoulder surgery      Precautions   Precautions None      Restrictions   Weight Bearing Restrictions No      Balance Screen   Has the patient fallen in the past 6 months Yes    How many times? 1, dog placed a ball on the treadmill while I was running    Is the patient reluctant to leave their home because of a fear of falling?  No      Prior Function   Level of Independence Independent    Vocation Full time employment    Leisure work out, yard work, visit family      Cognition   Overall Cognitive Status Within Functional Limits for tasks assessed      Observation/Other Assessments   Focus on Therapeutic Outcomes (FOTO)  56%      ROM / Strength   AROM / PROM / Strength AROM;Strength      AROM   AROM Assessment Site Shoulder;Cervical    Cervical Flexion 30    Cervical Extension 25    Cervical - Right Side Bend 15    Cervical - Left Side Bend 22    Cervical - Right Rotation 55    Cervical - Left Rotation 62      Strength   Strength Assessment  Site Shoulder    Right/Left Shoulder Right;Left    Right Shoulder Flexion 4/5    Right Shoulder Extension 5/5    Right Shoulder ABduction 4/5    Right Shoulder Internal Rotation 4+/5    Right Shoulder  External Rotation 4+/5    Left Shoulder Flexion 4/5    Left Shoulder Extension 5/5    Left Shoulder ABduction 4/5    Left Shoulder Internal Rotation 4+/5    Left Shoulder External Rotation 4+/5      Palpation   Palpation comment latent and active trigger points noted in bilateral upper traps, tenderness noted in mid to lower cervical paraspinals      Transfers   Five time sit to stand comments  [facility]      Ambulation/Gait   Gait Comments amb with normalize gait pattern no device                        Objective measurements completed on examination: See above findings.       [facility] PT Treatment/Exercise - 04/04/21 0001       Exercises   Exercises Neck      Neck Exercises: Supine   Neck Retraction 10 reps;5 secs      Neck Exercises: Stretches   Upper Trapezius Stretch Right;Left;2 reps;10 seconds    Levator Stretch Right;Left;2 reps;10 seconds    Other Neck Stretches Door stretch shoulders at 90 degrees holding 20 seconds                     PT Education - 04/04/21 1245     Education Details HEP, PT POC    Person(s) Educated Patient    Methods Explanation;Demonstration;Handout;Verbal cues    Comprehension Verbalized understanding;Returned demonstration              PT Short Term Goals - 04/04/21 1247       PT SHORT TERM GOAL #1   Title Pt will be indpendent in her initial HEP.    Time 3    Period Weeks    Status New    Target Date 04/29/21               PT Long Term Goals - 04/04/21 1247       PT LONG TERM GOAL #1   Title Pt will be independent in her advanced HEP    Time 10    Period Weeks    Status New    Target Date 06/17/21      PT LONG TERM GOAL #2   Title Pt will be able to improve bilateral cervical   rotation to >/= 60 degrees.    Time 10    Period Weeks    Status New    Target Date 06/17/21      PT LONG TERM GOAL #3   Title Pt will be able to report pain of </= 2/10 in her neck when working full day.    Time 10    Period Weeks    Status New    Target Date 06/17/21      PT LONG TERM GOAL #4   Title Pt will be able to improve bilateral shoulder strength to >/= 5/5 with fleixon and abduction to improve functional mobility.    Time 10    Period Weeks    Status New    Target Date 06/17/21      PT LONG TERM GOAL #5   Title Pt will improve her FOTO to >/=71%    Baseline 59% at evaluation    Time 10    Period Weeks    Status New    Target Date 06/17/21  Plan - 04/04/21 1301     Clinical Impression Statement Pt arriving today for evaluation of cervical pain. Pt reporting new onset about 1 month ago when she woke up with "shooting" pains which leads to migraine like headaches. Pt stating she sits at her desk most days. We reviewed simple desk ergonomics. Pt presenting with limitations in cervical rotation and mild weakness noted in bilateral shoulder flexion and abduction. Pt with latent and active trigger points noted in bilateral upper traps. Skilled PT needed to address pt's impairments with the below interventions.    Personal Factors and Comorbidities Comorbidity 3+    Comorbidities anxiety, asthma, depression, h/o amaurosis fugas, Hodgkin's Lymphoma, hypothoidism, hyperlipidemia, MVP, multiple breast surgeries    Examination-Activity Limitations Other;Sit    Examination-Participation Restrictions Community Activity;Occupation;Other    Stability/Clinical Decision Making Stable/Uncomplicated    Clinical Decision Making Low    Rehab Potential Good    PT Duration --   10 weeks   PT Treatment/Interventions ADLs/Self Care Home Management;Cryotherapy;Moist Heat;Traction;Functional mobility training;Therapeutic activities;Therapeutic  exercise;Neuromuscular re-education;Patient/family education;Passive range of motion;Dry needling;Manual techniques    PT Next Visit Plan UBE, cervical stretching, postural exercises, manual / STM, consider DN to upper traps    PT Home Exercise Plan Access Code: [email or web address]  URL: [email or web address]  Date: 04/04/2021  Prepared by: Kearney Hard    Exercises  Seated Assisted Cervical Rotation with Towel - 2-3 x daily - 7 x weekly - 3-5 reps - 10-15 seconds hold  3 Finger Cervical Rotation - 2-3 x daily - 7 x weekly - 3-5 reps - 10-15 seconds hold  Supine Passive Cervical Retraction - 2-3 x daily - 7 x weekly - 10 reps - 5 seconds hold  Doorway Pec Stretch at 90 Degrees Abduction - 2-3 x daily - 7 x weekly - 5 reps - 20 seconds hold    Consulted and Agree with Plan of Care Patient             Patient will benefit from skilled therapeutic intervention in order to improve the following deficits and impairments:  Pain, Postural dysfunction, Decreased strength, Improper body mechanics, Decreased mobility, Decreased activity tolerance, Impaired UE functional use, Decreased range of motion  Visit Diagnosis: Cervicalgia  Pain in thoracic spine  Muscle weakness (generalized)     Problem List Patient Active Problem List   Diagnosis Date Noted   Preop cardiovascular exam 11/29/2020   Hyperlipidemia with target LDL less than 130 08/31/2020   Essential hypertension 08/30/2020   Cancer of alimentary canal/tract ([place]) 12/08/2019   Closed fracture of distal phalanx of finger 04/17/2019   Sprain of right wrist 04/17/2019   Sleep disturbance 03/03/2019   RAD (reactive airway disease) 02/26/2019   Tachycardia 02/26/2019   History of radiation therapy 02/26/2019   Increased risk of breast cancer 02/26/2019   Vitamin D deficiency 11/12/2018   Depression, major, in remission ([place]) 11/12/2018   Aphthous ulcer 05/13/2018   High risk medication use 05/13/2018   Sore throat 12/07/2017    Acute non-recurrent maxillary sinusitis 12/07/2017   Vaccine counseling 07/26/2017   Breast mass, left 09/08/2016   Routine general medical examination at a health care facility 05/30/2016   Other specified hypothyroidism 04/01/2015   Encounter for health maintenance examination in adult 04/01/2015   Influenza vaccination declined 04/01/2015   Generalized anxiety disorder 04/01/2015   Asthma, mild intermittent 04/01/2015   Oral ulcer 04/01/2015   Biceps tendonitis on left 07/27/2014   Subacromial or subdeltoid bursitis 07/27/2014   Impingement syndrome  of left shoulder 06/09/2014   Left shoulder pain 05/26/2014   Major depression, recurrent ([place]) 05/25/2013   Allergic rhinitis 01/20/2013   Recurrent infections 01/20/2013   Moderate mitral regurgitation by prior echocardiogram 12/28/2012   Palpitation 10/25/2012   Female infertility of other specified origin 04/09/2012   Raynauds disease 06/29/2011   History of nodular sclerosis Hodgkin's disease 06/29/2011   Hodgkin's disease in remission ([place]) 12/29/2010   MVP (mitral valve prolapse) 12/29/2010    Oretha Caprice, PT, MPT 04/04/2021, 2:07 PM  [facility] Physical Therapy [address] [place], [place], [address] Phone: [phone]   Fax:  [phone]  Name: Jasline Castellini MRN: [id number] Date of Birth: 1981/05/14

## 2021-04-04 NOTE — Patient Instructions (Signed)
Access Code: [email or web address] URL: [email or web address] Date: 04/04/2021 Prepared by: Kearney Hard  Exercises Seated Assisted Cervical Rotation with Towel - 2-3 x daily - 7 x weekly - 3-5 reps - 10-15 seconds hold 3 Finger Cervical Rotation - 2-3 x daily - 7 x weekly - 3-5 reps - 10-15 seconds hold Supine Passive Cervical Retraction - 2-3 x daily - 7 x weekly - 10 reps - 5 seconds hold Doorway Pec Stretch at 90 Degrees Abduction - 2-3 x daily - 7 x weekly - 5 reps - 20 seconds hold

## 2021-04-20 ENCOUNTER — Other Ambulatory Visit: Payer: Self-pay

## 2021-04-20 ENCOUNTER — Encounter: Payer: Self-pay | Admitting: Physical Therapy

## 2021-04-20 ENCOUNTER — Ambulatory Visit (INDEPENDENT_AMBULATORY_CARE_PROVIDER_SITE_OTHER): Payer: BC Managed Care – PPO | Admitting: Physical Therapy

## 2021-04-20 DIAGNOSIS — M542 Cervicalgia: Secondary | ICD-10-CM | POA: Diagnosis not present

## 2021-04-20 DIAGNOSIS — M546 Pain in thoracic spine: Secondary | ICD-10-CM | POA: Diagnosis not present

## 2021-04-20 DIAGNOSIS — M6281 Muscle weakness (generalized): Secondary | ICD-10-CM

## 2021-04-20 NOTE — Therapy (Addendum)
[facility] Physical Therapy [address] [place], [place], [address] Phone: [phone]   Fax:  [phone]  Physical Therapy Treatment  Patient Details  Name: Lisa Waters MRN: [id number] Date of Birth: 1980/08/26 Referring Provider (PT): Jill Alexanders MD   Encounter Date: 04/20/2021   PT End of Session - 04/20/21 0931     Visit Number 2    Number of Visits 10    Date for PT Re-Evaluation 06/17/21    PT Start Time 0845    PT Stop Time 0925    PT Time Calculation (min) 40 min    Activity Tolerance Patient tolerated treatment well    Behavior During Therapy [facility] for tasks assessed/performed             Past Medical History:  Diagnosis Date   Anxiety    Asthma    mild intermittent as of 07/2017 - just when patient gets sick   Depression    Female infertility    GERD (gastroesophageal reflux disease)    Hx - no current problems, no meds   H/O amaurosis fugax Fall 2012   Resolved, no current problems, Hx-MRI of brain/brain stem: no acute abnormality, small area of encephalomalacia left superior cerebellum that could be prior trauma, infection, or lacunar infarct   History of Hodgkin's lymphoma 2007   in remission, sees [facility], prior with Dr. Elwanda [place]; prior chemotherapy and radiation therapy; sees yearly in December, no current problems   Hyperlipidemia 07/2017   diet control, no meds   Hypothyroidism    Moderate mitral regurgitation by prior echocardiogram 09/2010   10/2015: EF 55-60%. Normal WM. Normal D Fxn. Bilateral MVP w/ Mod MR (very eccentric). Normal LA, RV & RA. Normal PAP.   MVP (mitral valve prolapse) 09/2010   Dr. Ellyn Hack, [facility]; a) Echo 3/'12: Mod MR; EF 60-65%; b) TEE 5/'12: Nl LV Fxn, EF 60-65%, mild, holosystolic prolapse of the medial anterior leaflet. Mod MR . c) Echo 3/'15: Moderate, holosystolic prolapse of  medial. Mod MR d) TM Stress Echo (@ [place]) Nl Lv Fxn, Mild-Mod MR @ Res0 --> Mod MR at HR 181 bpm (7:22 min, 10.10 METS) w/p  WMA   Oral ulcer    Palpitations 10/2012   holter monitor, Dr. Ellyn Hack; no arrhythmia, no current problems    Past Surgical History:  Procedure Laterality Date   [place]  2002   COLONOSCOPY  07/2014   Dr. Collene Mares - normal   DILATATION & CURETTAGE/HYSTEROSCOPY WITH MYOSURE N/A 01/29/2018   Procedure: [place] POLYPECTOMY;  Surgeon: Charyl Bigger, MD;  Location: [facility];  Service: Gynecology;  Laterality: N/A;   ESOPHAGOGASTRODUODENOSCOPY  04/11/13   [facility] Dr. Collene Mares   FOOT SURGERY Bilateral    on toes   INSERTION CENTRAL VENOUS ACCESS DEVICE W/ SUBCUTANEOUS PORT  2006   LYMPH NODE BIOPSY  2006   under right arm   NM MYOCAR PERF WALL MOTION  12/2010   persantine myoview - moderate breast attenuation (fixed mid-distal anterior defect), EF 68%, low risk scan   RHINOPLASTY  2007   deviated septum   TEE WITHOUT CARDIOVERSION  11/2010   Nl LV Fxn, EF 60-65%, mild, holosystolic prolapse of the medial anterior leaflet. Mod MR .    TRANSTHORACIC ECHOCARDIOGRAM  3/'14; 3/'15   LVEF 60-65%, wall motion normal, LV function normal, moderate holosystolic MVP (medial the middle scallop of the posterior leaflet), moderate regurgitation (directed posteriorly), no shunt --  stable over 2 years   TRANSTHORACIC ECHOCARDIOGRAM  10/2015   EF 55-60%. Normal WM. Normal D Fxn. Bilateral MVP w/ Mod MR (very eccentric). Normal LA, RV & RA. Normal PAP.   TRANSTHORACIC ECHOCARDIOGRAM  10/22/2019   EF 55 to 60%.  Mild LVH.  GRII DD.  Elevated LAP.  Mild LA dilation.  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  Normal aortic valve.  (Stable   WISDOM TOOTH EXTRACTION      There were no vitals filed for this visit.   Subjective Assessment - 04/20/21 0928     Subjective Pt arriving today still reporting neck pain and upper trap sorness. Pt still reporting headaches. Pt also reporting compliance in her HEP.    Pertinent History  double masectomy, 9-10 surgeries due to breast surgeries,anxiety, asthma, depression, h/o amaurosis fugas, Hodgkin's Lymphoma, hypothoidism, hyperlipidemia, MVP    Limitations Sitting    Patient Stated Goals Stop hurting so I can work pain free    Currently in Pain? Yes    Pain Score 3     Pain Location Neck    Pain Orientation Lower;Mid    Pain Descriptors / Indicators Aching    Pain Type Acute pain    Pain Onset 1 to 4 weeks ago                               [facility] Adult PT Treatment/Exercise - 04/20/21 0001       Neck Exercises: Standing   Other Standing Exercises rows: level 3 band x 20    Other Standing Exercises shoulder extension: x20 Level 3 band      Neck Exercises: Supine   Neck Retraction 10 reps;5 secs    Other Supine Exercise shoulder flexion with 2# bar x 15 reps      Neck Exercises: Stretches   Upper Trapezius Stretch Right;Left;2 reps;10 seconds    Levator Stretch Right;Left;2 reps;10 seconds    Other Neck Stretches neck rotation holding      Manual Therapy   Manual therapy comments STM: active trigger point release to left upper trap and mid trap, IASTM to left upper trap              Trigger Point Dry Needling - 04/20/21 0001     Consent Given? Yes    Education Handout Provided No    Muscles Treated Head and Neck Upper trapezius   Lt   Upper Trapezius Response Twitch reponse elicited                     PT Short Term Goals - 04/04/21 1247       PT SHORT TERM GOAL #1   Title Pt will be indpendent in her initial HEP.    Time 3    Period Weeks    Status New    Target Date 04/29/21               PT Long Term Goals - 04/04/21 1247       PT LONG TERM GOAL #1   Title Pt will be independent in her advanced HEP    Time 10    Period Weeks    Status New    Target Date 06/17/21      PT LONG TERM GOAL #2   Title Pt will be able to improve bilateral cervical  rotation to >/= 60 degrees.    Time 10  Period  Weeks    Status New    Target Date 06/17/21      PT LONG TERM GOAL #3   Title Pt will be able to report pain of </= 2/10 in her neck when working full day.    Time 10    Period Weeks    Status New    Target Date 06/17/21      PT LONG TERM GOAL #4   Title Pt will be able to improve bilateral shoulder strength to >/= 5/5 with fleixon and abduction to improve functional mobility.    Time 10    Period Weeks    Status New    Target Date 06/17/21      PT LONG TERM GOAL #5   Title Pt will improve her FOTO to >/=71%    Baseline 59% at evaluation    Time 10    Period Weeks    Status New    Target Date 06/17/21                   Plan - 04/20/21 0932     Clinical Impression Statement Pt arriving today reporting 3/10 pain in her neck and still reporting headaches. Pt feels her HEP is helping. Pt with notable active trigger points in her left upper trap. DN performed by Scot Jun, PT, DPT, OCS, ATC. Pt tolerating exercises well. Continue skilled PT to maximize funciton.    Personal Factors and Comorbidities Comorbidity 3+    Comorbidities anxiety, asthma, depression, h/o amaurosis fugas, Hodgkin's Lymphoma, hypothoidism, hyperlipidemia, MVP, multiple breast surgeries    Examination-Activity Limitations Other;Sit    Examination-Participation Restrictions Community Activity;Occupation;Other    Stability/Clinical Decision Making Stable/Uncomplicated    Rehab Potential Good    PT Treatment/Interventions ADLs/Self Care Home Management;Cryotherapy;Moist Heat;Traction;Functional mobility training;Therapeutic activities;Therapeutic exercise;Neuromuscular re-education;Patient/family education;Passive range of motion;Dry needling;Manual techniques    PT Next Visit Plan assess response to DN, UBE, cervical stretching, postural exercises, manual / STM,    PT Home Exercise Plan Access Code: [email or web address]  URL: [email or web address]  Date: 04/04/2021  Prepared by: Kearney Hard     Exercises  Seated Assisted Cervical Rotation with Towel - 2-3 x daily - 7 x weekly - 3-5 reps - 10-15 seconds hold  3 Finger Cervical Rotation - 2-3 x daily - 7 x weekly - 3-5 reps - 10-15 seconds hold  Supine Passive Cervical Retraction - 2-3 x daily - 7 x weekly - 10 reps - 5 seconds hold  Doorway Pec Stretch at 90 Degrees Abduction - 2-3 x daily - 7 x weekly - 5 reps - 20 seconds hold    Consulted and Agree with Plan of Care Patient             Patient will benefit from skilled therapeutic intervention in order to improve the following deficits and impairments:  Pain, Postural dysfunction, Decreased strength, Improper body mechanics, Decreased mobility, Decreased activity tolerance, Impaired UE functional use, Decreased range of motion  Visit Diagnosis: Cervicalgia  Pain in thoracic spine  Muscle weakness (generalized)     Problem List Patient Active Problem List   Diagnosis Date Noted   Preop cardiovascular exam 11/29/2020   Hyperlipidemia with target LDL less than 130 08/31/2020   Essential hypertension 08/30/2020   Cancer of alimentary canal/tract ([place]) 12/08/2019   Closed fracture of distal phalanx of finger 04/17/2019   Sprain of right wrist 04/17/2019   Sleep disturbance 03/03/2019   RAD (reactive airway disease) 02/26/2019   Tachycardia 02/26/2019  History of radiation therapy 02/26/2019   Increased risk of breast cancer 02/26/2019   Vitamin D deficiency 11/12/2018   Depression, major, in remission ([place]) 11/12/2018   Aphthous ulcer 05/13/2018   High risk medication use 05/13/2018   Sore throat 12/07/2017   Acute non-recurrent maxillary sinusitis 12/07/2017   Vaccine counseling 07/26/2017   Breast mass, left 09/08/2016   Routine general medical examination at a health care facility 05/30/2016   Other specified hypothyroidism 04/01/2015   Encounter for health maintenance examination in adult 04/01/2015   Influenza vaccination declined 04/01/2015   Generalized  anxiety disorder 04/01/2015   Asthma, mild intermittent 04/01/2015   Oral ulcer 04/01/2015   Biceps tendonitis on left 07/27/2014   Subacromial or subdeltoid bursitis 07/27/2014   Impingement syndrome of left shoulder 06/09/2014   Left shoulder pain 05/26/2014   Major depression, recurrent ([place]) 05/25/2013   Allergic rhinitis 01/20/2013   Recurrent infections 01/20/2013   Moderate mitral regurgitation by prior echocardiogram 12/28/2012   Palpitation 10/25/2012   Female infertility of other specified origin 04/09/2012   Raynauds disease 06/29/2011   History of nodular sclerosis Hodgkin's disease 06/29/2011   Hodgkin's disease in remission ([place]) 12/29/2010   MVP (mitral valve prolapse) 12/29/2010    Oretha Caprice, PT, MPT 04/20/2021, 9:36 AM  [facility] Physical Therapy [address] [place], [place], [address] Phone: [phone]   Fax:  [phone]  Name: Sahej Hauswirth MRN: [id number] Date of Birth: 05/11/1981

## 2021-04-21 DIAGNOSIS — N6489 Other specified disorders of breast: Secondary | ICD-10-CM | POA: Diagnosis not present

## 2021-04-21 DIAGNOSIS — Z421 Encounter for breast reconstruction following mastectomy: Secondary | ICD-10-CM | POA: Diagnosis not present

## 2021-04-21 DIAGNOSIS — Z87891 Personal history of nicotine dependence: Secondary | ICD-10-CM | POA: Diagnosis not present

## 2021-04-21 DIAGNOSIS — Z853 Personal history of malignant neoplasm of breast: Secondary | ICD-10-CM | POA: Diagnosis not present

## 2021-04-21 DIAGNOSIS — Z9221 Personal history of antineoplastic chemotherapy: Secondary | ICD-10-CM | POA: Diagnosis not present

## 2021-04-25 ENCOUNTER — Other Ambulatory Visit: Payer: Self-pay

## 2021-04-25 ENCOUNTER — Ambulatory Visit (INDEPENDENT_AMBULATORY_CARE_PROVIDER_SITE_OTHER): Payer: BC Managed Care – PPO | Admitting: Physical Therapy

## 2021-04-25 ENCOUNTER — Encounter: Payer: Self-pay | Admitting: Physical Therapy

## 2021-04-25 DIAGNOSIS — M546 Pain in thoracic spine: Secondary | ICD-10-CM | POA: Diagnosis not present

## 2021-04-25 DIAGNOSIS — M542 Cervicalgia: Secondary | ICD-10-CM

## 2021-04-25 DIAGNOSIS — M6281 Muscle weakness (generalized): Secondary | ICD-10-CM

## 2021-04-25 NOTE — Therapy (Signed)
[facility] Physical Therapy [address] [place], [place], [address] Phone: [phone]   Fax:  [phone]  Physical Therapy Treatment  Patient Details  Name: Lisa Waters MRN: [id number] Date of Birth: Jun 15, 1981 Referring Provider (PT): Jill Alexanders MD   Encounter Date: 04/25/2021   PT End of Session - 04/25/21 1321     Visit Number 3    Number of Visits 10    Date for PT Re-Evaluation 06/17/21    PT Start Time 9030    PT Stop Time 1335    PT Time Calculation (min) 30 min    Activity Tolerance Patient tolerated treatment well    Behavior During Therapy [facility] for tasks assessed/performed             Past Medical History:  Diagnosis Date   Anxiety    Asthma    mild intermittent as of 07/2017 - just when patient gets sick   Depression    Female infertility    GERD (gastroesophageal reflux disease)    Hx - no current problems, no meds   H/O amaurosis fugax Fall 2012   Resolved, no current problems, Hx-MRI of brain/brain stem: no acute abnormality, small area of encephalomalacia left superior cerebellum that could be prior trauma, infection, or lacunar infarct   History of Hodgkin's lymphoma 2007   in remission, sees [facility], prior with Dr. Elwanda [place]; prior chemotherapy and radiation therapy; sees yearly in December, no current problems   Hyperlipidemia 07/2017   diet control, no meds   Hypothyroidism    Moderate mitral regurgitation by prior echocardiogram 09/2010   10/2015: EF 55-60%. Normal WM. Normal D Fxn. Bilateral MVP w/ Mod MR (very eccentric). Normal LA, RV & RA. Normal PAP.   MVP (mitral valve prolapse) 09/2010   Dr. Ellyn Hack, [facility]; a) Echo 3/'12: Mod MR; EF 60-65%; b) TEE 5/'12: Nl LV Fxn, EF 60-65%, mild, holosystolic prolapse of the medial anterior leaflet. Mod MR . c) Echo 3/'15: Moderate, holosystolic prolapse of  medial. Mod MR d) TM Stress Echo (@ [place]) Nl Lv Fxn, Mild-Mod MR @ Res0 --> Mod MR at HR 181 bpm (7:22 min, 10.10 METS) w/p  WMA   Oral ulcer    Palpitations 10/2012   holter monitor, Dr. Ellyn Hack; no arrhythmia, no current problems    Past Surgical History:  Procedure Laterality Date   [place]  2002   COLONOSCOPY  07/2014   Dr. Collene Mares - normal   DILATATION & CURETTAGE/HYSTEROSCOPY WITH MYOSURE N/A 01/29/2018   Procedure: [place] POLYPECTOMY;  Surgeon: Charyl Bigger, MD;  Location: [facility];  Service: Gynecology;  Laterality: N/A;   ESOPHAGOGASTRODUODENOSCOPY  04/11/13   [facility] Dr. Collene Mares   FOOT SURGERY Bilateral    on toes   INSERTION CENTRAL VENOUS ACCESS DEVICE W/ SUBCUTANEOUS PORT  2006   LYMPH NODE BIOPSY  2006   under right arm   NM MYOCAR PERF WALL MOTION  12/2010   persantine myoview - moderate breast attenuation (fixed mid-distal anterior defect), EF 68%, low risk scan   RHINOPLASTY  2007   deviated septum   TEE WITHOUT CARDIOVERSION  11/2010   Nl LV Fxn, EF 60-65%, mild, holosystolic prolapse of the medial anterior leaflet. Mod MR .    TRANSTHORACIC ECHOCARDIOGRAM  3/'14; 3/'15   LVEF 60-65%, wall motion normal, LV function normal, moderate holosystolic MVP (medial the middle scallop of the posterior leaflet), moderate regurgitation (directed posteriorly), no shunt --  stable over 2 years   TRANSTHORACIC ECHOCARDIOGRAM  10/2015   EF 55-60%. Normal WM. Normal D Fxn. Bilateral MVP w/ Mod MR (very eccentric). Normal LA, RV & RA. Normal PAP.   TRANSTHORACIC ECHOCARDIOGRAM  10/22/2019   EF 55 to 60%.  Mild LVH.  GRII DD.  Elevated LAP.  Mild LA dilation.  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  Normal aortic valve.  (Stable   WISDOM TOOTH EXTRACTION      There were no vitals filed for this visit.   Subjective Assessment - 04/25/21 1315     Subjective Pt arriving to therapy s/p breast surgery on 03/22/2021. Pt stating her MD told her to limit strenous activities, but to go ahead with ADL's as normal.     Pertinent History double masectomy, 9-10 surgeries due to breast surgeries,anxiety, asthma, depression, h/o amaurosis fugas, Hodgkin's Lymphoma, hypothoidism, hyperlipidemia, MVP    Limitations Sitting    Patient Stated Goals Stop hurting so I can work pain free    Currently in Pain? No/denies                               [facility] Adult PT Treatment/Exercise - 04/25/21 0001       Neck Exercises: Machines for Strengthening   UBE (Upper Arm Bike) L2 x 6 minutes      Neck Exercises: Standing   Other Standing Exercises rows: level 2  band x 20, wall angels x 10, functional squats with shoulder flexion keeping upright posture.    Other Standing Exercises shoulder extension and flexion each : x20 Level 2 band      Neck Exercises: Supine   Neck Retraction 10 reps;5 secs    Other Supine Exercise shoulder flexion with 2# bar x 20 reps      Neck Exercises: Stretches   Upper Trapezius Stretch Right;Left;2 reps;10 seconds    Levator Stretch Right;Left;2 reps;10 seconds    Other Neck Stretches cervical rotation stretch each side x 4 holding 5 seconds                       PT Short Term Goals - 04/25/21 1327       PT SHORT TERM GOAL #1   Title Pt will be indpendent in her initial HEP.    Status Achieved               PT Long Term Goals - 04/25/21 1327       PT LONG TERM GOAL #1   Title Pt will be independent in her advanced HEP    Status On-going      PT LONG TERM GOAL #2   Title Pt will be able to improve bilateral cervical  rotation to >/= 60 degrees.    Status On-going      PT LONG TERM GOAL #3   Title Pt will be able to report pain of </= 2/10 in her neck when working full day.    Status On-going      PT LONG TERM GOAL #4   Title Pt will be able to improve bilateral shoulder strength to >/= 5/5 with fleixon and abduction to improve functional mobility.    Status On-going      PT LONG TERM GOAL #5   Title Pt will improve her FOTO to  >/=71%    Status On-going  Plan - 04/25/21 1322     Clinical Impression Statement Pt arriving reporting great response to DN at her last visit. Pt arriving today with no pain. Pt s/p right breast surgery on 03/24/2021. Tissue healing well with steri-strips intact. Pt's MD stating no strenous activities. Our session focused on ROM and mild strengthening with decreased resistance used this session. Continuing skilled Pt as pt tolerates in order to maximize function.    Personal Factors and Comorbidities Comorbidity 3+    Comorbidities anxiety, asthma, depression, h/o amaurosis fugas, Hodgkin's Lymphoma, hypothoidism, hyperlipidemia, MVP, multiple breast surgeries    Examination-Activity Limitations Other;Sit    Examination-Participation Restrictions Community Activity;Occupation;Other    Stability/Clinical Decision Making Stable/Uncomplicated    Rehab Potential Good    PT Treatment/Interventions ADLs/Self Care Home Management;Cryotherapy;Moist Heat;Traction;Functional mobility training;Therapeutic activities;Therapeutic exercise;Neuromuscular re-education;Patient/family education;Passive range of motion;Dry needling;Manual techniques    PT Next Visit Plan UBE, cervical stretching, postural exercises, manual / STM, DN again if indicated    PT Home Exercise Plan Access Code: [email or web address]  URL: [email or web address]  Date: 04/04/2021  Prepared by: Kearney Hard    Exercises  Seated Assisted Cervical Rotation with Towel - 2-3 x daily - 7 x weekly - 3-5 reps - 10-15 seconds hold  3 Finger Cervical Rotation - 2-3 x daily - 7 x weekly - 3-5 reps - 10-15 seconds hold  Supine Passive Cervical Retraction - 2-3 x daily - 7 x weekly - 10 reps - 5 seconds hold  Doorway Pec Stretch at 90 Degrees Abduction - 2-3 x daily - 7 x weekly - 5 reps - 20 seconds hold    Consulted and Agree with Plan of Care Patient             Patient will benefit from skilled therapeutic  intervention in order to improve the following deficits and impairments:  Pain, Postural dysfunction, Decreased strength, Improper body mechanics, Decreased mobility, Decreased activity tolerance, Impaired UE functional use, Decreased range of motion  Visit Diagnosis: Cervicalgia  Pain in thoracic spine  Muscle weakness (generalized)     Problem List Patient Active Problem List   Diagnosis Date Noted   Preop cardiovascular exam 11/29/2020   Hyperlipidemia with target LDL less than 130 08/31/2020   Essential hypertension 08/30/2020   Cancer of alimentary canal/tract ([place]) 12/08/2019   Closed fracture of distal phalanx of finger 04/17/2019   Sprain of right wrist 04/17/2019   Sleep disturbance 03/03/2019   RAD (reactive airway disease) 02/26/2019   Tachycardia 02/26/2019   History of radiation therapy 02/26/2019   Increased risk of breast cancer 02/26/2019   Vitamin D deficiency 11/12/2018   Depression, major, in remission ([place]) 11/12/2018   Aphthous ulcer 05/13/2018   High risk medication use 05/13/2018   Sore throat 12/07/2017   Acute non-recurrent maxillary sinusitis 12/07/2017   Vaccine counseling 07/26/2017   Breast mass, left 09/08/2016   Routine general medical examination at a health care facility 05/30/2016   Other specified hypothyroidism 04/01/2015   Encounter for health maintenance examination in adult 04/01/2015   Influenza vaccination declined 04/01/2015   Generalized anxiety disorder 04/01/2015   Asthma, mild intermittent 04/01/2015   Oral ulcer 04/01/2015   Biceps tendonitis on left 07/27/2014   Subacromial or subdeltoid bursitis 07/27/2014   Impingement syndrome of left shoulder 06/09/2014   Left shoulder pain 05/26/2014   Major depression, recurrent ([place]) 05/25/2013   Allergic rhinitis 01/20/2013   Recurrent infections 01/20/2013   Moderate mitral regurgitation by prior echocardiogram 12/28/2012   Palpitation 10/25/2012  Female infertility of other  specified origin 04/09/2012   Raynauds disease 06/29/2011   History of nodular sclerosis Hodgkin's disease 06/29/2011   Hodgkin's disease in remission ([place]) 12/29/2010   MVP (mitral valve prolapse) 12/29/2010    Oretha Caprice, PT, MPT 04/25/2021, 1:42 PM  [facility] Physical Therapy [address] [place], [place], [address] Phone: [phone]   Fax:  [phone]  Name: Lisa Waters MRN: [id number] Date of Birth: 06-29-1981

## 2021-05-02 ENCOUNTER — Encounter: Payer: Self-pay | Admitting: Physical Therapy

## 2021-05-02 ENCOUNTER — Other Ambulatory Visit: Payer: Self-pay

## 2021-05-02 ENCOUNTER — Ambulatory Visit (INDEPENDENT_AMBULATORY_CARE_PROVIDER_SITE_OTHER): Payer: BC Managed Care – PPO | Admitting: Physical Therapy

## 2021-05-02 DIAGNOSIS — M542 Cervicalgia: Secondary | ICD-10-CM

## 2021-05-02 DIAGNOSIS — M6281 Muscle weakness (generalized): Secondary | ICD-10-CM

## 2021-05-02 DIAGNOSIS — M546 Pain in thoracic spine: Secondary | ICD-10-CM

## 2021-05-02 NOTE — Therapy (Signed)
[facility] [address] [place], [place], [address] Phone: [phone]   Fax:  [phone]  Physical Therapy Treatment  Patient Details  Name: Lisa Waters MRN: [id number] Date of Birth: October 31, 1980 Referring Provider (PT): Jill Alexanders MD   Encounter Date: 05/02/2021   PT End of Session - 05/02/21 1225     Visit Number 4    Number of Visits 10    Date for PT Re-Evaluation 06/17/21    PT Start Time 1055    PT Stop Time 2694    PT Time Calculation (min) 40 min    Activity Tolerance Patient tolerated treatment well    Behavior During Therapy [facility] for tasks assessed/performed             Past Medical History:  Diagnosis Date   Anxiety    Asthma    mild intermittent as of 07/2017 - just when patient gets sick   Depression    Female infertility    GERD (gastroesophageal reflux disease)    Hx - no current problems, no meds   H/O amaurosis fugax Fall 2012   Resolved, no current problems, Hx-MRI of brain/brain stem: no acute abnormality, small area of encephalomalacia left superior cerebellum that could be prior trauma, infection, or lacunar infarct   History of Hodgkin's lymphoma 2007   in remission, sees [facility], prior with Dr. Elwanda [place]; prior chemotherapy and radiation therapy; sees yearly in December, no current problems   Hyperlipidemia 07/2017   diet control, no meds   Hypothyroidism    Moderate mitral regurgitation by prior echocardiogram 09/2010   10/2015: EF 55-60%. Normal WM. Normal D Fxn. Bilateral MVP w/ Mod MR (very eccentric). Normal LA, RV & RA. Normal PAP.   MVP (mitral valve prolapse) 09/2010   Dr. Ellyn Hack, [facility]; a) Echo 3/'12: Mod MR; EF 60-65%; b) TEE 5/'12: Nl LV Fxn, EF 60-65%, mild, holosystolic prolapse of the medial anterior leaflet. Mod MR . c) Echo 3/'15: Moderate, holosystolic prolapse of  medial. Mod MR d) TM Stress Echo (@ [place]) Nl Lv Fxn, Mild-Mod MR @ Res0 --> Mod MR at HR 181 bpm (7:22 min, 10.10 METS) w/p  WMA   Oral ulcer    Palpitations 10/2012   holter monitor, Dr. Ellyn Hack; no arrhythmia, no current problems    Past Surgical History:  Procedure Laterality Date   [place]  2002   COLONOSCOPY  07/2014   Dr. Collene Mares - normal   DILATATION & CURETTAGE/HYSTEROSCOPY WITH MYOSURE N/A 01/29/2018   Procedure: [place] POLYPECTOMY;  Surgeon: Charyl Bigger, MD;  Location: [facility];  Service: Gynecology;  Laterality: N/A;   ESOPHAGOGASTRODUODENOSCOPY  04/11/13   [facility] Dr. Collene Mares   FOOT SURGERY Bilateral    on toes   INSERTION CENTRAL VENOUS ACCESS DEVICE W/ SUBCUTANEOUS PORT  2006   LYMPH NODE BIOPSY  2006   under right arm   NM MYOCAR PERF WALL MOTION  12/2010   persantine myoview - moderate breast attenuation (fixed mid-distal anterior defect), EF 68%, low risk scan   RHINOPLASTY  2007   deviated septum   TEE WITHOUT CARDIOVERSION  11/2010   Nl LV Fxn, EF 60-65%, mild, holosystolic prolapse of the medial anterior leaflet. Mod MR .    TRANSTHORACIC ECHOCARDIOGRAM  3/'14; 3/'15   LVEF 60-65%, wall motion normal, LV function normal, moderate holosystolic MVP (medial the middle scallop of the posterior leaflet), moderate regurgitation (directed posteriorly), no shunt --  stable over 2 years   TRANSTHORACIC ECHOCARDIOGRAM  10/2015   EF 55-60%. Normal WM. Normal D Fxn. Bilateral MVP w/ Mod MR (very eccentric). Normal LA, RV & RA. Normal PAP.   TRANSTHORACIC ECHOCARDIOGRAM  10/22/2019   EF 55 to 60%.  Mild LVH.  GRII DD.  Elevated LAP.  Mild LA dilation.  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  Normal aortic valve.  (Stable   WISDOM TOOTH EXTRACTION      There were no vitals filed for this visit.   Subjective Assessment - 05/02/21 1223     Subjective Pt arriving today with 2/10 pain.    Pertinent History double masectomy, 9-10 surgeries due to breast surgeries,anxiety, asthma, depression, h/o amaurosis fugas,  Hodgkin's Lymphoma, hypothoidism, hyperlipidemia, MVP    Patient Stated Goals Stop hurting so I can work pain free    Currently in Pain? Yes    Pain Score 2     Pain Location Neck    Pain Orientation Lower    Pain Descriptors / Indicators Aching    Pain Type Acute pain    Pain Onset 1 to 4 weeks ago                               [facility] Adult PT Treatment/Exercise - 05/02/21 0001       Neck Exercises: Machines for Strengthening   UBE (Upper Arm Bike) L3 x 6 minutes      Neck Exercises: Standing   Other Standing Exercises rows level 3 band x 20    Other Standing Exercises shoulder flexion Level 2 band x 20, shoulder extension level 3 band x 20, standing shoulder scaption with 2# weight with back against full foam roller x 20 using 2# weights      Neck Exercises: Stretches   Upper Trapezius Stretch Right;Left;2 reps;10 seconds    Levator Stretch Right;Left;2 reps;10 seconds    Other Neck Stretches horizontal and vertical placement of full foam roller for thoracic extension x 2 holding 30 seconds      Manual Therapy   Manual therapy comments STM over active trigger points in levator, rhomboid and upper trap              Trigger Point Dry Needling - 05/02/21 1155     Consent Given? Yes    Education Handout Provided No   verbally reviewed   Muscles Treated Head and Neck Upper trapezius;Levator scapulae    Muscles Treated Upper Quadrant Rhomboids    Upper Trapezius Response Twitch reponse elicited    Levator Scapulae Response Twitch response elicited    Rhomboids Response Twitch response elicited                     PT Short Term Goals - 05/02/21 1226       PT SHORT TERM GOAL #1   Title Pt will be indpendent in her initial HEP.    Status Achieved               PT Long Term Goals - 05/02/21 1226       PT LONG TERM GOAL #1   Title Pt will be independent in her advanced HEP    Status On-going      PT LONG TERM GOAL #2   Title  Pt will be able to improve bilateral cervical  rotation to >/= 60 degrees.    Status On-going  PT LONG TERM GOAL #3   Title Pt will be able to report pain of </= 2/10 in her neck when working full day.    Status On-going      PT LONG TERM GOAL #4   Title Pt will be able to improve bilateral shoulder strength to >/= 5/5 with fleixon and abduction to improve functional mobility.      PT LONG TERM GOAL #5   Title Pt will improve her FOTO to >/=71%    Status On-going                   Plan - 05/02/21 1226     Clinical Impression Statement Pt arriving to therapy today reporting 2/10 pain. Pt stating her MD cleared her from restrictions following her breast surgery. Pt tolreating treatment exercises well with more strengtheing and stretching performed. Pt with active trigger points noted in left levator. DN performed by Faustino Congress PT, DPT with twitch response noted. Continue skilled PT to maximize function.    Personal Factors and Comorbidities Comorbidity 3+    Comorbidities anxiety, asthma, depression, h/o amaurosis fugas, Hodgkin's Lymphoma, hypothoidism, hyperlipidemia, MVP, multiple breast surgeries    Examination-Activity Limitations Other;Sit    Examination-Participation Restrictions Community Activity;Occupation;Other    Stability/Clinical Decision Making Stable/Uncomplicated    Rehab Potential Good    PT Treatment/Interventions ADLs/Self Care Home Management;Cryotherapy;Moist Heat;Traction;Functional mobility training;Therapeutic activities;Therapeutic exercise;Neuromuscular re-education;Patient/family education;Passive range of motion;Dry needling;Manual techniques    PT Next Visit Plan continue UBE, cervical stretching, postural exercises, thoracic strething, manual / STM, DN again if indicated    PT Home Exercise Plan Access Code: [email or web address]  URL: [email or web address]  Date: 04/04/2021  Prepared by: Kearney Hard    Exercises  Seated Assisted  Cervical Rotation with Towel - 2-3 x daily - 7 x weekly - 3-5 reps - 10-15 seconds hold  3 Finger Cervical Rotation - 2-3 x daily - 7 x weekly - 3-5 reps - 10-15 seconds hold  Supine Passive Cervical Retraction - 2-3 x daily - 7 x weekly - 10 reps - 5 seconds hold  Doorway Pec Stretch at 90 Degrees Abduction - 2-3 x daily - 7 x weekly - 5 reps - 20 seconds hold    Consulted and Agree with Plan of Care Patient             Patient will benefit from skilled therapeutic intervention in order to improve the following deficits and impairments:  Pain, Postural dysfunction, Decreased strength, Improper body mechanics, Decreased mobility, Decreased activity tolerance, Impaired UE functional use, Decreased range of motion  Visit Diagnosis: Cervicalgia  Pain in thoracic spine  Muscle weakness (generalized)     Problem List Patient Active Problem List   Diagnosis Date Noted   Preop cardiovascular exam 11/29/2020   Hyperlipidemia with target LDL less than 130 08/31/2020   Essential hypertension 08/30/2020   Cancer of alimentary canal/tract ([place]) 12/08/2019   Closed fracture of distal phalanx of finger 04/17/2019   Sprain of right wrist 04/17/2019   Sleep disturbance 03/03/2019   RAD (reactive airway disease) 02/26/2019   Tachycardia 02/26/2019   History of radiation therapy 02/26/2019   Increased risk of breast cancer 02/26/2019   Vitamin D deficiency 11/12/2018   Depression, major, in remission ([place]) 11/12/2018   Aphthous ulcer 05/13/2018   High risk medication use 05/13/2018   Sore throat 12/07/2017   Acute non-recurrent maxillary sinusitis 12/07/2017   Vaccine counseling 07/26/2017   Breast mass, left 09/08/2016   Routine general  medical examination at a health care facility 05/30/2016   Other specified hypothyroidism 04/01/2015   Encounter for health maintenance examination in adult 04/01/2015   Influenza vaccination declined 04/01/2015   Generalized anxiety disorder 04/01/2015    Asthma, mild intermittent 04/01/2015   Oral ulcer 04/01/2015   Biceps tendonitis on left 07/27/2014   Subacromial or subdeltoid bursitis 07/27/2014   Impingement syndrome of left shoulder 06/09/2014   Left shoulder pain 05/26/2014   Major depression, recurrent ([place]) 05/25/2013   Allergic rhinitis 01/20/2013   Recurrent infections 01/20/2013   Moderate mitral regurgitation by prior echocardiogram 12/28/2012   Palpitation 10/25/2012   Female infertility of other specified origin 04/09/2012   Raynauds disease 06/29/2011   History of nodular sclerosis Hodgkin's disease 06/29/2011   Hodgkin's disease in remission ([place]) 12/29/2010   MVP (mitral valve prolapse) 12/29/2010    Oretha Caprice, PT 05/02/2021, 12:30 PM  Laureen Abrahams, PT, DPT 05/02/21 12:34 PM   [facility] [address] [place], [place], [address] Phone: [phone]   Fax:  [phone]  Name: Lisa Waters MRN: [id number] Date of Birth: 1981/06/19

## 2021-05-09 ENCOUNTER — Encounter: Payer: Self-pay | Admitting: Physical Therapy

## 2021-05-09 ENCOUNTER — Encounter: Payer: BC Managed Care – PPO | Admitting: Rehabilitative and Restorative Service Providers"

## 2021-05-11 DIAGNOSIS — Z124 Encounter for screening for malignant neoplasm of cervix: Secondary | ICD-10-CM | POA: Diagnosis not present

## 2021-05-11 DIAGNOSIS — Z01419 Encounter for gynecological examination (general) (routine) without abnormal findings: Secondary | ICD-10-CM | POA: Diagnosis not present

## 2021-05-11 DIAGNOSIS — Z6826 Body mass index (BMI) 26.0-26.9, adult: Secondary | ICD-10-CM | POA: Diagnosis not present

## 2021-05-11 DIAGNOSIS — Z113 Encounter for screening for infections with a predominantly sexual mode of transmission: Secondary | ICD-10-CM | POA: Diagnosis not present

## 2021-05-11 DIAGNOSIS — Z01411 Encounter for gynecological examination (general) (routine) with abnormal findings: Secondary | ICD-10-CM | POA: Diagnosis not present

## 2021-05-16 ENCOUNTER — Other Ambulatory Visit: Payer: Self-pay

## 2021-05-16 ENCOUNTER — Ambulatory Visit (INDEPENDENT_AMBULATORY_CARE_PROVIDER_SITE_OTHER): Payer: BC Managed Care – PPO | Admitting: Physical Therapy

## 2021-05-16 ENCOUNTER — Encounter: Payer: Self-pay | Admitting: Physical Therapy

## 2021-05-16 DIAGNOSIS — M6281 Muscle weakness (generalized): Secondary | ICD-10-CM | POA: Diagnosis not present

## 2021-05-16 DIAGNOSIS — M546 Pain in thoracic spine: Secondary | ICD-10-CM | POA: Diagnosis not present

## 2021-05-16 DIAGNOSIS — M542 Cervicalgia: Secondary | ICD-10-CM | POA: Diagnosis not present

## 2021-05-16 NOTE — Therapy (Signed)
[facility] [address] [place], [place], [address] Phone: [phone]   Fax:  [phone]  Physical Therapy Treatment  Patient Details  Name: Lisa Waters MRN: [id number] Date of Birth: 04/04/1981 Referring Provider (PT): Jill Alexanders MD   Encounter Date: 05/16/2021   PT End of Session - 05/16/21 1127     Visit Number 5    Number of Visits 10    Date for PT Re-Evaluation 06/17/21    PT Start Time 1104    PT Stop Time 1142    PT Time Calculation (min) 38 min    Activity Tolerance Patient tolerated treatment well    Behavior During Therapy [facility] for tasks assessed/performed             Past Medical History:  Diagnosis Date   Anxiety    Asthma    mild intermittent as of 07/2017 - just when patient gets sick   Depression    Female infertility    GERD (gastroesophageal reflux disease)    Hx - no current problems, no meds   H/O amaurosis fugax Fall 2012   Resolved, no current problems, Hx-MRI of brain/brain stem: no acute abnormality, small area of encephalomalacia left superior cerebellum that could be prior trauma, infection, or lacunar infarct   History of Hodgkin's lymphoma 2007   in remission, sees [facility], prior with Dr. Elwanda Brooklyn; prior chemotherapy and radiation therapy; sees yearly in December, no current problems   Hyperlipidemia 07/2017   diet control, no meds   Hypothyroidism    Moderate mitral regurgitation by prior echocardiogram 09/2010   10/2015: EF 55-60%. Normal WM. Normal D Fxn. Bilateral MVP w/ Mod MR (very eccentric). Normal LA, RV & RA. Normal PAP.   MVP (mitral valve prolapse) 09/2010   Dr. Ellyn Hack, [facility]; a) Echo 3/'12: Mod MR; EF 60-65%; b) TEE 5/'12: Nl LV Fxn, EF 60-65%, mild, holosystolic prolapse of the medial anterior leaflet. Mod MR . c) Echo 3/'15: Moderate, holosystolic prolapse of  medial. Mod MR d) TM Stress Echo (@ [place]) Nl Lv Fxn, Mild-Mod MR @ Res0 --> Mod MR at HR 181 bpm (7:22 min, 10.10 METS) w/p  WMA   Oral ulcer    Palpitations 10/2012   holter monitor, Dr. Ellyn Hack; no arrhythmia, no current problems    Past Surgical History:  Procedure Laterality Date   [place]  2002   COLONOSCOPY  07/2014   Dr. Collene Mares - normal   DILATATION & CURETTAGE/HYSTEROSCOPY WITH MYOSURE N/A 01/29/2018   Procedure: [place] POLYPECTOMY;  Surgeon: Charyl Bigger, MD;  Location: [facility];  Service: Gynecology;  Laterality: N/A;   ESOPHAGOGASTRODUODENOSCOPY  04/11/13   [facility] Dr. Collene Mares   FOOT SURGERY Bilateral    on toes   INSERTION CENTRAL VENOUS ACCESS DEVICE W/ SUBCUTANEOUS PORT  2006   LYMPH NODE BIOPSY  2006   under right arm   NM MYOCAR PERF WALL MOTION  12/2010   persantine myoview - moderate breast attenuation (fixed mid-distal anterior defect), EF 68%, low risk scan   RHINOPLASTY  2007   deviated septum   TEE WITHOUT CARDIOVERSION  11/2010   Nl LV Fxn, EF 60-65%, mild, holosystolic prolapse of the medial anterior leaflet. Mod MR .    TRANSTHORACIC ECHOCARDIOGRAM  3/'14; 3/'15   LVEF 60-65%, wall motion normal, LV function normal, moderate holosystolic MVP (medial the middle scallop of the posterior leaflet), moderate regurgitation (directed posteriorly), no shunt --  stable over 2 years   TRANSTHORACIC ECHOCARDIOGRAM  10/2015   EF 55-60%. Normal WM. Normal D Fxn. Bilateral MVP w/ Mod MR (very eccentric). Normal LA, RV & RA. Normal PAP.   TRANSTHORACIC ECHOCARDIOGRAM  10/22/2019   EF 55 to 60%.  Mild LVH.  GRII DD.  Elevated LAP.  Mild LA dilation.  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  Normal aortic valve.  (Stable   WISDOM TOOTH EXTRACTION      There were no vitals filed for this visit.   Subjective Assessment - 05/16/21 1220     Subjective Pt arriving today with 1-2/10 pain.    Pertinent History double masectomy, 9-10 surgeries due to breast surgeries,anxiety, asthma, depression, h/o amaurosis  fugas, Hodgkin's Lymphoma, hypothoidism, hyperlipidemia, MVP    Patient Stated Goals Stop hurting so I can work pain free    Currently in Pain? Yes    Pain Score 2     Pain Location Neck    Pain Orientation Lower    Pain Descriptors / Indicators Sore    Pain Type Acute pain    Pain Onset More than a month ago                [facility] PT Assessment - 05/16/21 0001       Assessment   Medical Diagnosis cervicalgia M54.2, M54.6 throacic pain    Referring Provider (PT) Jill Alexanders MD    Hand Dominance Right                           [facility] Adult PT Treatment/Exercise - 05/16/21 0001       Neck Exercises: Machines for Strengthening   UBE (Upper Arm Bike) L3 x 6 minutes      Neck Exercises: Standing   UE D1 Weights (lbs) 2# 2x10    UE D2 Weights (lbs) 2# 2x10    Other Standing Exercises rows level 3 band x 20    Other Standing Exercises shoulder flexion Level 2 band x 20, shoulder extension level 3 band x 20, standing shoulder scaption with 2# weight with back against full foam roller x 20 using 2# weights      Manual Therapy   Manual therapy comments STM over active trigger points in levator, rhomboid and upper trap                       PT Short Term Goals - 05/02/21 1226       PT SHORT TERM GOAL #1   Title Pt will be indpendent in her initial HEP.    Status Achieved               PT Long Term Goals - 05/16/21 1222       PT LONG TERM GOAL #1   Title Pt will be independent in her advanced HEP    Status On-going      PT LONG TERM GOAL #2   Title Pt will be able to improve bilateral cervical  rotation to >/= 60 degrees.    Status On-going      PT LONG TERM GOAL #3   Title Pt will be able to report pain of </= 2/10 in her neck when working full day.    Status On-going      PT LONG TERM GOAL #4   Title Pt will be able to improve bilateral shoulder strength to >/= 5/5 with fleixon and abduction to improve  functional mobility.     Status On-going      PT LONG TERM GOAL #5   Title Pt will improve her FOTO to >/=71%    Status On-going                   Plan - 05/16/21 1128     Clinical Impression Statement Pt arriving today reporting 2/10 pain in left levator/rhomboids. Pt tolerating more strengtheing in today's session. STM and trigger point release performed. Cotninue skilled PT.    Personal Factors and Comorbidities Comorbidity 3+    Comorbidities anxiety, asthma, depression, h/o amaurosis fugas, Hodgkin's Lymphoma, hypothoidism, hyperlipidemia, MVP, multiple breast surgeries    Examination-Activity Limitations Other;Sit    Examination-Participation Restrictions Community Activity;Occupation;Other    Stability/Clinical Decision Making Stable/Uncomplicated    Rehab Potential Good    PT Treatment/Interventions ADLs/Self Care Home Management;Cryotherapy;Moist Heat;Traction;Functional mobility training;Therapeutic activities;Therapeutic exercise;Neuromuscular re-education;Patient/family education;Passive range of motion;Dry needling;Manual techniques    PT Next Visit Plan continue UBE, cervical stretching, postural exercises, thoracic strething, manual / STM, DN again if indicated    PT Home Exercise Plan Access Code: [email or web address]  URL: [email or web address]  Date: 04/04/2021  Prepared by: Kearney Hard    Exercises  Seated Assisted Cervical Rotation with Towel - 2-3 x daily - 7 x weekly - 3-5 reps - 10-15 seconds hold  3 Finger Cervical Rotation - 2-3 x daily - 7 x weekly - 3-5 reps - 10-15 seconds hold  Supine Passive Cervical Retraction - 2-3 x daily - 7 x weekly - 10 reps - 5 seconds hold  Doorway Pec Stretch at 90 Degrees Abduction - 2-3 x daily - 7 x weekly - 5 reps - 20 seconds hold    Consulted and Agree with Plan of Care Patient             Patient will benefit from skilled therapeutic intervention in order to improve the following deficits and impairments:  Pain, Postural dysfunction,  Decreased strength, Improper body mechanics, Decreased mobility, Decreased activity tolerance, Impaired UE functional use, Decreased range of motion  Visit Diagnosis: Cervicalgia  Pain in thoracic spine  Muscle weakness (generalized)     Problem List Patient Active Problem List   Diagnosis Date Noted   Preop cardiovascular exam 11/29/2020   Hyperlipidemia with target LDL less than 130 08/31/2020   Essential hypertension 08/30/2020   Cancer of alimentary canal/tract ([place]) 12/08/2019   Closed fracture of distal phalanx of finger 04/17/2019   Sprain of right wrist 04/17/2019   Sleep disturbance 03/03/2019   RAD (reactive airway disease) 02/26/2019   Tachycardia 02/26/2019   History of radiation therapy 02/26/2019   Increased risk of breast cancer 02/26/2019   Vitamin D deficiency 11/12/2018   Depression, major, in remission ([place]) 11/12/2018   Aphthous ulcer 05/13/2018   High risk medication use 05/13/2018   Sore throat 12/07/2017   Acute non-recurrent maxillary sinusitis 12/07/2017   Vaccine counseling 07/26/2017   Breast mass, left 09/08/2016   Routine general medical examination at a health care facility 05/30/2016   Other specified hypothyroidism 04/01/2015   Encounter for health maintenance examination in adult 04/01/2015   Influenza vaccination declined 04/01/2015   Generalized anxiety disorder 04/01/2015   Asthma, mild intermittent 04/01/2015   Oral ulcer 04/01/2015   Biceps tendonitis on left 07/27/2014   Subacromial or subdeltoid bursitis 07/27/2014   Impingement syndrome of left shoulder 06/09/2014   Left shoulder pain 05/26/2014   Major depression, recurrent ([place]) 05/25/2013   Allergic rhinitis 01/20/2013  Recurrent infections 01/20/2013   Moderate mitral regurgitation by prior echocardiogram 12/28/2012   Palpitation 10/25/2012   Female infertility of other specified origin 04/09/2012   Raynauds disease 06/29/2011   History of nodular sclerosis Hodgkin's  disease 06/29/2011   Hodgkin's disease in remission ([place]) 12/29/2010   MVP (mitral valve prolapse) 12/29/2010    Oretha Caprice, PT, MPT 05/16/2021, 12:23 PM  [facility] [address] [place], [place], [address] Phone: [phone]   Fax:  [phone]  Name: Lisa Waters MRN: [id number] Date of Birth: 01-11-1981

## 2021-05-23 ENCOUNTER — Encounter: Payer: BC Managed Care – PPO | Admitting: Physical Therapy

## 2021-05-23 ENCOUNTER — Telehealth: Payer: Self-pay | Admitting: Physical Therapy

## 2021-05-23 NOTE — Telephone Encounter (Signed)
Pt did not show for her 11:00 am appointment today. I called and left message at her home number to remind her of her upcoming appointment on 05/30/2021 at 11:00 am. Pt was also advised to call and reschedule this week if needed.   Kearney Hard, PT MPT 05/23/21 12:53 PM

## 2021-05-25 ENCOUNTER — Encounter: Payer: Self-pay | Admitting: Family Medicine

## 2021-05-25 ENCOUNTER — Telehealth: Payer: BC Managed Care – PPO | Admitting: Physician Assistant

## 2021-05-25 DIAGNOSIS — J069 Acute upper respiratory infection, unspecified: Secondary | ICD-10-CM | POA: Diagnosis not present

## 2021-05-25 MED ORDER — BENZONATATE 100 MG PO CAPS: 100.0000 mg | ORAL_CAPSULE | Freq: Three times a day (TID) | ORAL | 0 refills | Status: DC | PRN

## 2021-05-25 NOTE — Progress Notes (Signed)
I have spent 5 minutes in review of e-visit questionnaire, review and updating patient chart, medical decision making and response to patient.   Ubah Radke Cody Kavari Parrillo, PA-C    

## 2021-05-25 NOTE — Progress Notes (Signed)
We are sorry that you are not feeling well.  Here is how we plan to help! ° °Based on your presentation I believe you most likely have A cough due to a virus.  This is called viral bronchitis and is best treated by rest, plenty of fluids and control of the cough.  You may use Ibuprofen or Tylenol as directed to help your symptoms.   °  °In addition you may use A prescription cough medication called Tessalon Perles 100mg. You may take 1-2 capsules every 8 hours as needed for your cough. ° ° °From your responses in the eVisit questionnaire you describe inflammation in the upper respiratory tract which is causing a significant cough.  This is commonly called Bronchitis and has four common causes:   °Allergies °Viral Infections °Acid Reflux °Bacterial Infection °Allergies, viruses and acid reflux are treated by controlling symptoms or eliminating the cause. An example might be a cough caused by taking certain blood pressure medications. You stop the cough by changing the medication. Another example might be a cough caused by acid reflux. Controlling the reflux helps control the cough. ° °USE OF BRONCHODILATOR ("RESCUE") INHALERS: °There is a risk from using your bronchodilator too frequently.  The risk is that over-reliance on a medication which only relaxes the muscles surrounding the breathing tubes can reduce the effectiveness of medications prescribed to reduce swelling and congestion of the tubes themselves.  Although you feel brief relief from the bronchodilator inhaler, your asthma may actually be worsening with the tubes becoming more swollen and filled with mucus.  This can delay other crucial treatments, such as oral steroid medications. If you need to use a bronchodilator inhaler daily, several times per day, you should discuss this with your provider.  There are probably better treatments that could be used to keep your asthma under control.  °   °HOME CARE °Only take medications as instructed by your  medical team. °Complete the entire course of an antibiotic. °Drink plenty of fluids and get plenty of rest. °Avoid close contacts especially the very young and the elderly °Cover your mouth if you cough or cough into your sleeve. °Always remember to wash your hands °A steam or ultrasonic humidifier can help congestion.  ° °GET HELP RIGHT AWAY IF: °You develop worsening fever. °You become short of breath °You cough up blood. °Your symptoms persist after you have completed your treatment plan °MAKE SURE YOU  °Understand these instructions. °Will watch your condition. °Will get help right away if you are not doing well or get worse. °  ° °Thank you for choosing an e-visit. ° °Your e-visit answers were reviewed by a board certified advanced clinical practitioner to complete your personal care plan. Depending upon the condition, your plan could have included both over the counter or prescription medications. ° °Please review your pharmacy choice. Make sure the pharmacy is open so you can pick up prescription now. If there is a problem, you may contact your provider through MyChart messaging and have the prescription routed to another pharmacy.  Your safety is important to us. If you have drug allergies check your prescription carefully.  ° °For the next 24 hours you can use MyChart to ask questions about today's visit, request a non-urgent call back, or ask for a work or school excuse. °You will get an email in the next two days asking about your experience. I hope that your e-visit has been valuable and will speed your recovery. ° °

## 2021-05-30 ENCOUNTER — Encounter: Payer: Self-pay | Admitting: Physical Therapy

## 2021-05-30 ENCOUNTER — Emergency Department (HOSPITAL_COMMUNITY)
Admission: EM | Admit: 2021-05-30 | Discharge: 2021-05-31 | Disposition: A | Discharge status: Home / Self Care | Payer: BC Managed Care – PPO | Attending: Emergency Medicine | Admitting: Emergency Medicine

## 2021-05-30 ENCOUNTER — Ambulatory Visit (INDEPENDENT_AMBULATORY_CARE_PROVIDER_SITE_OTHER): Payer: BC Managed Care – PPO | Admitting: Physical Therapy

## 2021-05-30 ENCOUNTER — Other Ambulatory Visit: Payer: Self-pay

## 2021-05-30 ENCOUNTER — Encounter (HOSPITAL_COMMUNITY): Payer: Self-pay | Admitting: Emergency Medicine

## 2021-05-30 DIAGNOSIS — R000 Tachycardia, unspecified: Secondary | ICD-10-CM | POA: Insufficient documentation

## 2021-05-30 DIAGNOSIS — J45909 Unspecified asthma, uncomplicated: Secondary | ICD-10-CM | POA: Diagnosis not present

## 2021-05-30 DIAGNOSIS — R7989 Other specified abnormal findings of blood chemistry: Secondary | ICD-10-CM | POA: Insufficient documentation

## 2021-05-30 DIAGNOSIS — N9489 Other specified conditions associated with female genital organs and menstrual cycle: Secondary | ICD-10-CM | POA: Insufficient documentation

## 2021-05-30 DIAGNOSIS — R911 Solitary pulmonary nodule: Secondary | ICD-10-CM | POA: Diagnosis not present

## 2021-05-30 DIAGNOSIS — F419 Anxiety disorder, unspecified: Secondary | ICD-10-CM | POA: Diagnosis not present

## 2021-05-30 DIAGNOSIS — I10 Essential (primary) hypertension: Secondary | ICD-10-CM | POA: Diagnosis not present

## 2021-05-30 DIAGNOSIS — R945 Abnormal results of liver function studies: Secondary | ICD-10-CM | POA: Diagnosis not present

## 2021-05-30 DIAGNOSIS — R0602 Shortness of breath: Secondary | ICD-10-CM | POA: Diagnosis not present

## 2021-05-30 DIAGNOSIS — Z8509 Personal history of malignant neoplasm of other digestive organs: Secondary | ICD-10-CM | POA: Diagnosis not present

## 2021-05-30 DIAGNOSIS — E039 Hypothyroidism, unspecified: Secondary | ICD-10-CM | POA: Insufficient documentation

## 2021-05-30 DIAGNOSIS — U071 COVID-19: Secondary | ICD-10-CM | POA: Diagnosis not present

## 2021-05-30 DIAGNOSIS — M6281 Muscle weakness (generalized): Secondary | ICD-10-CM

## 2021-05-30 DIAGNOSIS — Z87891 Personal history of nicotine dependence: Secondary | ICD-10-CM | POA: Diagnosis not present

## 2021-05-30 DIAGNOSIS — M542 Cervicalgia: Secondary | ICD-10-CM

## 2021-05-30 DIAGNOSIS — Z79899 Other long term (current) drug therapy: Secondary | ICD-10-CM | POA: Insufficient documentation

## 2021-05-30 DIAGNOSIS — M546 Pain in thoracic spine: Secondary | ICD-10-CM | POA: Diagnosis not present

## 2021-05-30 DIAGNOSIS — R531 Weakness: Secondary | ICD-10-CM | POA: Diagnosis not present

## 2021-05-30 DIAGNOSIS — R079 Chest pain, unspecified: Secondary | ICD-10-CM | POA: Diagnosis not present

## 2021-05-30 LAB — CBC WITH DIFFERENTIAL/PLATELET
Abs Immature Granulocytes: 0.03 10*3/uL (ref 0.00–0.07)
Basophils Absolute: 0 10*3/uL (ref 0.0–0.1)
Basophils Relative: 1 %
Eosinophils Absolute: 0.1 10*3/uL (ref 0.0–0.5)
Eosinophils Relative: 2 %
HCT: 36.4 % (ref 36.0–46.0)
Hemoglobin: 11.7 g/dL — ABNORMAL LOW (ref 12.0–15.0)
Immature Granulocytes: 1 %
Lymphocytes Relative: 33 %
Lymphs Abs: 1.3 10*3/uL (ref 0.7–4.0)
MCH: 29.8 pg (ref 26.0–34.0)
MCHC: 32.1 g/dL (ref 30.0–36.0)
MCV: 92.9 fL (ref 80.0–100.0)
Monocytes Absolute: 0.5 10*3/uL (ref 0.1–1.0)
Monocytes Relative: 12 %
Neutro Abs: 2 10*3/uL (ref 1.7–7.7)
Neutrophils Relative %: 51 %
Platelets: 221 10*3/uL (ref 150–400)
RBC: 3.92 MIL/uL (ref 3.87–5.11)
RDW: 13.3 % (ref 11.5–15.5)
WBC: 3.9 10*3/uL — ABNORMAL LOW (ref 4.0–10.5)
nRBC: 0 % (ref 0.0–0.2)

## 2021-05-30 NOTE — ED Provider Notes (Signed)
Emergency Medicine Provider Triage Evaluation Note  Lisa Waters , a 40 y.o. female  was evaluated in triage.  Pt complains of shortness of breath that started a few hours prior to arrival.  Patient reports that he developed shortness of breath which has been constant, no alleviating or aggravating factors until EMS arrived and applied supplemental nasal cannula, they relate to her that her oxygen levels looked okay prior to this.  She has had a cough for the past several days with recent diagnosis of viral bronchitis.  Recent breast surgery revision.  Not currently receiving chemotherapy/radiation therapy.  Review of Systems  Positive: Dyspnea, cough.  Negative: Chest pain, hemoptysis, leg swelling.   Physical Exam  BP (!) 158/94   Pulse 99   Temp 98.5 F (36.9 C) (Oral)   Resp 18   Ht 5\' 7"  (1.702 m)   Wt 74.8 kg   SpO2 100%   BMI 25.84 kg/m  Gen:   Awake, no distress   Resp:  Normal effort  MSK:   Moves extremities without difficulty  Other:  Breath sounds present bilaterally, no adventitious breath sounds noted.  Patient on nasal cannula, this was turned off, maintain SPO2 greater than 95% during this time, she did feel better with the oxygen on therefore this was replaced.   Medical Decision Making  Medically screening exam initiated at 11:14 PM.  Appropriate orders placed.  Lisa Waters was informed that the remainder of the evaluation will be completed by another provider, this initial triage assessment does not replace that evaluation, and the importance of remaining in the ED until their evaluation is complete.  Obtain EKG, chest x-ray, and basic labs.  Given patient's history considering pulmonary embolism, however contrast dye allergy documented, she is not able to be placed in the acute care area of the ED at this time. Discussed w/ attending, plan to hold off on CT imaging at this time.     Lisa Dyke, PA-C 05/31/21 0507    Lisa Rasmussen, MD 05/31/21  1014

## 2021-05-30 NOTE — ED Triage Notes (Signed)
Patient here after having a "dry needle tap" for back pain and shortness of breath.  Patient with clear breath sounds, hypertensive. Patient denies any chest pain or nausea or vomiting.

## 2021-05-30 NOTE — Therapy (Addendum)
[facility] Physical Therapy [address] [place], [place], [address] Phone: [phone]   Fax:  [phone]  Physical Therapy Treatment  Patient Details  Name: Lisa Waters MRN: [id number] Date of Birth: January 09, 1981 Referring Provider (PT): Jill Alexanders MD   Encounter Date: 05/30/2021   PT End of Session - 05/30/21 1145     Visit Number 6    Number of Visits 10    Date for PT Re-Evaluation 06/17/21    PT Start Time 1104    PT Stop Time 4401    PT Time Calculation (min) 41 min    Activity Tolerance Patient tolerated treatment well    Behavior During Therapy [facility] for tasks assessed/performed             Past Medical History:  Diagnosis Date   Anxiety    Asthma    mild intermittent as of 07/2017 - just when patient gets sick   Depression    Female infertility    GERD (gastroesophageal reflux disease)    Hx - no current problems, no meds   H/O amaurosis fugax Fall 2012   Resolved, no current problems, Hx-MRI of brain/brain stem: no acute abnormality, small area of encephalomalacia left superior cerebellum that could be prior trauma, infection, or lacunar infarct   History of Hodgkin's lymphoma 2007   in remission, sees [facility], prior with Dr. Elwanda [place]; prior chemotherapy and radiation therapy; sees yearly in December, no current problems   Hyperlipidemia 07/2017   diet control, no meds   Hypothyroidism    Moderate mitral regurgitation by prior echocardiogram 09/2010   10/2015: EF 55-60%. Normal WM. Normal D Fxn. Bilateral MVP w/ Mod MR (very eccentric). Normal LA, RV & RA. Normal PAP.   MVP (mitral valve prolapse) 09/2010   Dr. Ellyn Hack, [facility]; a) Echo 3/'12: Mod MR; EF 60-65%; b) TEE 5/'12: Nl LV Fxn, EF 60-65%, mild, holosystolic prolapse of the medial anterior leaflet. Mod MR . c) Echo 3/'15: Moderate, holosystolic prolapse of  medial. Mod MR d) TM Stress Echo (@ [place]) Nl Lv Fxn, Mild-Mod MR @ Res0 --> Mod MR at HR 181 bpm (7:22 min, 10.10 METS) w/p  WMA   Oral ulcer    Palpitations 10/2012   holter monitor, Dr. Ellyn Hack; no arrhythmia, no current problems    Past Surgical History:  Procedure Laterality Date   [place]  2002   COLONOSCOPY  07/2014   Dr. Collene Mares - normal   DILATATION & CURETTAGE/HYSTEROSCOPY WITH MYOSURE N/A 01/29/2018   Procedure: [place] POLYPECTOMY;  Surgeon: Charyl Bigger, MD;  Location: [facility];  Service: Gynecology;  Laterality: N/A;   ESOPHAGOGASTRODUODENOSCOPY  04/11/13   [facility] Dr. Collene Mares   FOOT SURGERY Bilateral    on toes   INSERTION CENTRAL VENOUS ACCESS DEVICE W/ SUBCUTANEOUS PORT  2006   LYMPH NODE BIOPSY  2006   under right arm   NM MYOCAR PERF WALL MOTION  12/2010   persantine myoview - moderate breast attenuation (fixed mid-distal anterior defect), EF 68%, low risk scan   RHINOPLASTY  2007   deviated septum   TEE WITHOUT CARDIOVERSION  11/2010   Nl LV Fxn, EF 60-65%, mild, holosystolic prolapse of the medial anterior leaflet. Mod MR .    TRANSTHORACIC ECHOCARDIOGRAM  3/'14; 3/'15   LVEF 60-65%, wall motion normal, LV function normal, moderate holosystolic MVP (medial the middle scallop of the posterior leaflet), moderate regurgitation (directed posteriorly), no shunt --  stable over 2 years   TRANSTHORACIC ECHOCARDIOGRAM  10/2015   EF 55-60%. Normal WM. Normal D Fxn. Bilateral MVP w/ Mod MR (very eccentric). Normal LA, RV & RA. Normal PAP.   TRANSTHORACIC ECHOCARDIOGRAM  10/22/2019   EF 55 to 60%.  Mild LVH.  GRII DD.  Elevated LAP.  Mild LA dilation.  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  Normal aortic valve.  (Stable   WISDOM TOOTH EXTRACTION      There were no vitals filed for this visit.   Subjective Assessment - 05/30/21 1144     Subjective Pt arriving today reporting 6/10 pain in her left shoulder.    Pertinent History double masectomy, 9-10 surgeries due to breast surgeries,anxiety, asthma,  depression, h/o amaurosis fugas, Hodgkin's Lymphoma, hypothoidism, hyperlipidemia, MVP    Patient Stated Goals Stop hurting so I can work pain free    Currently in Pain? Yes    Pain Score 6     Pain Location Shoulder    Pain Orientation Left    Pain Descriptors / Indicators Aching;Other (Comment)    Pain Type Acute pain    Pain Onset More than a month ago                               [facility] Adult PT Treatment/Exercise - 05/30/21 0001       Neck Exercises: Machines for Strengthening   UBE (Upper Arm Bike) L3 x 6 minutes      Neck Exercises: Standing   UE D1 Weights (lbs) 2# 2x10    UE D2 Weights (lbs) 2# 2x10    Other Standing Exercises rows level 3 band x 20    Other Standing Exercises shoulder flexion with 2# weights x 20 reps, shoulder abduction 2# weight x 20      Manual Therapy   Manual therapy comments cervical mobs C4-6 grade 2-3, thoracic mobs T4-T8 grade 2-3, scapular mobs, STM over active trigger points, upper trap, infraspinatus, compression and skilled palpation during DN              Trigger Point Dry Needling - 05/30/21 0001     Consent Given? Yes    Education Handout Provided Yes    Muscles Treated Head and Neck Upper trapezius    Muscles Treated Upper Quadrant Infraspinatus    Upper Trapezius Response Twitch reponse elicited    Infraspinatus Response Palpable increased muscle length                     PT Short Term Goals - 05/02/21 1226       PT SHORT TERM GOAL #1   Title Pt will be indpendent in her initial HEP.    Status Achieved               PT Long Term Goals - 05/16/21 1222       PT LONG TERM GOAL #1   Title Pt will be independent in her advanced HEP    Status On-going      PT LONG TERM GOAL #2   Title Pt will be able to improve bilateral cervical  rotation to >/= 60 degrees.    Status On-going      PT LONG TERM GOAL #3   Title Pt will be able to report pain of </= 2/10 in her neck when working  full day.    Status On-going      PT LONG  TERM GOAL #4   Title Pt will be able to improve bilateral shoulder strength to >/= 5/5 with fleixon and abduction to improve functional mobility.    Status On-going      PT LONG TERM GOAL #5   Title Pt will improve her FOTO to >/=71%    Status On-going            PLAN:  Pt arriving today still reporting neck pain which improved some due to DN at last session. DN performed again to uppert traps and infraspinatus with twitch response noted and palpable increase in muscle length. Pt has been compliant in her HEP and tolerating exercises for shoulder strengthening and cervical stretching well. Continue skilled PT  to maximize pt's function.       Patient will benefit from skilled therapeutic intervention in order to improve the following deficits and impairments:     Visit Diagnosis: Cervicalgia  Pain in thoracic spine  Muscle weakness (generalized)     Problem List Patient Active Problem List   Diagnosis Date Noted   Preop cardiovascular exam 11/29/2020   Hyperlipidemia with target LDL less than 130 08/31/2020   Essential hypertension 08/30/2020   Cancer of alimentary canal/tract ([place]) 12/08/2019   Closed fracture of distal phalanx of finger 04/17/2019   Sprain of right wrist 04/17/2019   Sleep disturbance 03/03/2019   RAD (reactive airway disease) 02/26/2019   Tachycardia 02/26/2019   History of radiation therapy 02/26/2019   Increased risk of breast cancer 02/26/2019   Vitamin D deficiency 11/12/2018   Depression, major, in remission ([place]) 11/12/2018   Aphthous ulcer 05/13/2018   High risk medication use 05/13/2018   Sore throat 12/07/2017   Acute non-recurrent maxillary sinusitis 12/07/2017   Vaccine counseling 07/26/2017   Breast mass, left 09/08/2016   Routine general medical examination at a health care facility 05/30/2016   Other specified hypothyroidism 04/01/2015   Encounter for health maintenance examination  in adult 04/01/2015   Influenza vaccination declined 04/01/2015   Generalized anxiety disorder 04/01/2015   Asthma, mild intermittent 04/01/2015   Oral ulcer 04/01/2015   Biceps tendonitis on left 07/27/2014   Subacromial or subdeltoid bursitis 07/27/2014   Impingement syndrome of left shoulder 06/09/2014   Left shoulder pain 05/26/2014   Major depression, recurrent ([place]) 05/25/2013   Allergic rhinitis 01/20/2013   Recurrent infections 01/20/2013   Moderate mitral regurgitation by prior echocardiogram 12/28/2012   Palpitation 10/25/2012   Female infertility of other specified origin 04/09/2012   Raynauds disease 06/29/2011   History of nodular sclerosis Hodgkin's disease 06/29/2011   Hodgkin's disease in remission ([place]) 12/29/2010   MVP (mitral valve prolapse) 12/29/2010    Oretha Caprice, PT, MPT 05/30/2021, 12:40 PM  [facility] Physical Therapy [address] [place], [place], [address] Phone: [phone]   Fax:  [phone]  Name: Lisa Waters MRN: [id number] Date of Birth: 1980-12-01  Kearney Hard, PT, MPT 06/22/21 12:12 PM

## 2021-05-31 ENCOUNTER — Emergency Department (HOSPITAL_COMMUNITY): Payer: BC Managed Care – PPO

## 2021-05-31 DIAGNOSIS — R0602 Shortness of breath: Secondary | ICD-10-CM | POA: Diagnosis not present

## 2021-05-31 DIAGNOSIS — R079 Chest pain, unspecified: Secondary | ICD-10-CM | POA: Diagnosis not present

## 2021-05-31 DIAGNOSIS — R911 Solitary pulmonary nodule: Secondary | ICD-10-CM | POA: Diagnosis not present

## 2021-05-31 LAB — COMPREHENSIVE METABOLIC PANEL
ALT: 91 U/L — ABNORMAL HIGH (ref 0–44)
AST: 160 U/L — ABNORMAL HIGH (ref 15–41)
Albumin: 4.1 g/dL (ref 3.5–5.0)
Alkaline Phosphatase: 79 U/L (ref 38–126)
Anion gap: 13 (ref 5–15)
BUN: 10 mg/dL (ref 6–20)
CO2: 21 mmol/L — ABNORMAL LOW (ref 22–32)
Calcium: 9.4 mg/dL (ref 8.9–10.3)
Chloride: 103 mmol/L (ref 98–111)
Creatinine, Ser: 0.84 mg/dL (ref 0.44–1.00)
GFR, Estimated: >60 mL/min (ref >60)
Glucose, Bld: 93 mg/dL (ref 70–99)
Potassium: 3.7 mmol/L (ref 3.5–5.1)
Sodium: 137 mmol/L (ref 135–145)
Total Bilirubin: 0.7 mg/dL (ref 0.3–1.2)
Total Protein: 7.2 g/dL (ref 6.5–8.1)

## 2021-05-31 LAB — RESP PANEL BY RT-PCR (FLU A&B, COVID) ARPGX2
Influenza A by PCR: NEGATIVE
Influenza B by PCR: NEGATIVE
SARS Coronavirus 2 by RT PCR: POSITIVE — AB

## 2021-05-31 LAB — D-DIMER, QUANTITATIVE: D-Dimer, Quant: 0.55 ug/mL-FEU — ABNORMAL HIGH (ref 0.00–0.50)

## 2021-05-31 LAB — TROPONIN I (HIGH SENSITIVITY)
Troponin I (High Sensitivity): 4 ng/L (ref <18)
Troponin I (High Sensitivity): 4 ng/L (ref <18)

## 2021-05-31 LAB — I-STAT BETA HCG BLOOD, ED (MC, WL, AP ONLY): I-stat hCG, quantitative: <5 m[IU]/mL (ref <5)

## 2021-05-31 MED ORDER — MOLNUPIRAVIR EUA 200MG CAPSULE
4.0000 | ORAL_CAPSULE | Freq: Two times a day (BID) | ORAL | 0 refills | Status: AC
Start: 2021-05-31 — End: 2021-06-05

## 2021-05-31 MED ORDER — IOHEXOL 350 MG/ML SOLN
65.0000 mL | Freq: Once | INTRAVENOUS | Status: AC | PRN
  Administered 2021-05-31: 65 mL via INTRAVENOUS

## 2021-05-31 MED ORDER — HALOPERIDOL LACTATE 5 MG/ML IJ SOLN
2.0000 mg | Freq: Once | INTRAMUSCULAR | Status: AC
  Administered 2021-05-31: 2 mg via INTRAVENOUS
  Filled 2021-05-31: qty 1

## 2021-05-31 MED ORDER — DIPHENHYDRAMINE HCL 50 MG/ML IJ SOLN
25.0000 mg | Freq: Once | INTRAMUSCULAR | Status: AC
  Administered 2021-05-31: 25 mg via INTRAVENOUS
  Filled 2021-05-31: qty 1

## 2021-05-31 MED ORDER — SODIUM CHLORIDE 0.9 % IV BOLUS
500.0000 mL | Freq: Once | INTRAVENOUS | Status: AC
  Administered 2021-05-31: 500 mL via INTRAVENOUS

## 2021-05-31 NOTE — ED Provider Notes (Addendum)
[facility] EMERGENCY DEPARTMENT Provider Note   CSN: [id number] Arrival date & time: 05/30/21  2152     History Chief Complaint  Patient presents with   Shortness of Breath    Lisa Waters is a 40 y.o. female.  The history is provided by the patient.  Shortness of Breath Severity:  Moderate Onset quality:  Sudden Duration:  8 hours Timing:  Constant Progression:  Unchanged Chronicity:  Recurrent Context: emotional upset   Context: not strong odors   Context comment:  Had dry needling and was told it could cause a pneumothorax. Relieved by:  Nothing Worsened by:  Nothing Ineffective treatments:  None tried Associated symptoms: no abdominal pain, no chest pain, no diaphoresis, no fever, no neck pain, no rash, no syncope, no swollen glands and no wheezing   Risk factors: no hx of PE/DVT, no prolonged immobilization and no recent surgery   Patient with a h/o anxiety who is not taking her Zoloft as prescribed was having dry needling earlier in the day for cervicalgia and was told it could cause a pneumothorax. She is now feeling short of breath and is shaking all over and hyperventilating.  No CP, no DOE.      Past Medical History:  Diagnosis Date   Anxiety    Asthma    mild intermittent as of 07/2017 - just when patient gets sick   Depression    Female infertility    GERD (gastroesophageal reflux disease)    Hx - no current problems, no meds   H/O amaurosis [place] Fall 2012   Resolved, no current problems, Hx-MRI of brain/brain stem: no acute abnormality, small area of encephalomalacia left superior cerebellum that could be prior trauma, infection, or lacunar infarct   History of Hodgkin's lymphoma 2007   in remission, sees [facility], prior with Dr. Elwanda [place]; prior chemotherapy and radiation therapy; sees yearly in December, no current problems   Hyperlipidemia 07/2017   diet control, no meds   Hypothyroidism    Moderate mitral regurgitation by prior  echocardiogram 09/2010   10/2015: EF 55-60%. Normal WM. Normal D Fxn. Bilateral MVP w/ Mod MR (very eccentric). Normal LA, RV & RA. Normal PAP.   MVP (mitral valve prolapse) 09/2010   Dr. Ellyn Hack, [facility]; a) Echo 3/'12: Mod MR; EF 60-65%; b) TEE 5/'12: Nl LV Fxn, EF 60-65%, mild, holosystolic prolapse of the medial anterior leaflet. Mod MR . c) Echo 3/'15: Moderate, holosystolic prolapse of  medial. Mod MR d) TM Stress Echo (@ [place]) Nl Lv Fxn, Mild-Mod MR @ Res0 --> Mod MR at HR 181 bpm (7:22 min, 10.10 METS) w/p WMA   Oral ulcer    Palpitations 10/2012   holter monitor, Dr. Ellyn Hack; no arrhythmia, no current problems    Patient Active Problem List   Diagnosis Date Noted   Preop cardiovascular exam 11/29/2020   Hyperlipidemia with target LDL less than 130 08/31/2020   Essential hypertension 08/30/2020   Cancer of alimentary canal/tract ([place]) 12/08/2019   Closed fracture of distal phalanx of finger 04/17/2019   Sprain of right wrist 04/17/2019   Sleep disturbance 03/03/2019   RAD (reactive airway disease) 02/26/2019   Tachycardia 02/26/2019   History of radiation therapy 02/26/2019   Increased risk of breast cancer 02/26/2019   Vitamin D deficiency 11/12/2018   Depression, major, in remission ([place]) 11/12/2018   Aphthous ulcer 05/13/2018   High risk medication use 05/13/2018   Sore throat 12/07/2017   Acute non-recurrent maxillary sinusitis 12/07/2017  Vaccine counseling 07/26/2017   Breast mass, left 09/08/2016   Routine general medical examination at a health care facility 05/30/2016   Other specified hypothyroidism 04/01/2015   Encounter for health maintenance examination in adult 04/01/2015   Influenza vaccination declined 04/01/2015   Generalized anxiety disorder 04/01/2015   Asthma, mild intermittent 04/01/2015   Oral ulcer 04/01/2015   Biceps tendonitis on left 07/27/2014   Subacromial or subdeltoid bursitis 07/27/2014   Impingement syndrome of left shoulder 06/09/2014    Left shoulder pain 05/26/2014   Major depression, recurrent ([place]) 05/25/2013   Allergic rhinitis 01/20/2013   Recurrent infections 01/20/2013   Moderate mitral regurgitation by prior echocardiogram 12/28/2012   Palpitation 10/25/2012   Female infertility of other specified origin 04/09/2012   Raynauds disease 06/29/2011   History of nodular sclerosis Hodgkin's disease 06/29/2011   Hodgkin's disease in remission ([place]) 12/29/2010   MVP (mitral valve prolapse) 12/29/2010    Past Surgical History:  Procedure Laterality Date   [place]  2002   COLONOSCOPY  07/2014   Dr. Collene Mares - normal   DILATATION & CURETTAGE/HYSTEROSCOPY WITH MYOSURE N/A 01/29/2018   Procedure: DILATATION & CURETTAGE/HYSTEROSCOPY WITH MYOSURE POLYPECTOMY;  Surgeon: Charyl Bigger, MD;  Location: [facility];  Service: Gynecology;  Laterality: N/A;   ESOPHAGOGASTRODUODENOSCOPY  04/11/13   [facility] Dr. Collene Mares   FOOT SURGERY Bilateral    on toes   INSERTION CENTRAL VENOUS ACCESS DEVICE W/ SUBCUTANEOUS PORT  2006   LYMPH NODE BIOPSY  2006   under right arm   NM MYOCAR PERF WALL MOTION  12/2010   persantine myoview - moderate breast attenuation (fixed mid-distal anterior defect), EF 68%, low risk scan   RHINOPLASTY  2007   deviated septum   TEE WITHOUT CARDIOVERSION  11/2010   Nl LV Fxn, EF 60-65%, mild, holosystolic prolapse of the medial anterior leaflet. Mod MR .    TRANSTHORACIC ECHOCARDIOGRAM  3/'14; 3/'15   LVEF 60-65%, wall motion normal, LV function normal, moderate holosystolic MVP (medial the middle scallop of the posterior leaflet), moderate regurgitation (directed posteriorly), no shunt -- stable over 2 years   TRANSTHORACIC ECHOCARDIOGRAM  10/2015   EF 55-60%. Normal WM. Normal D Fxn. Bilateral MVP w/ Mod MR (very eccentric). Normal LA, RV & RA. Normal PAP.   TRANSTHORACIC ECHOCARDIOGRAM  10/22/2019   EF 55 to 60%.  Mild LVH.  GRII DD.  Elevated LAP.  Mild LA  dilation.  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  Normal aortic valve.  (Stable   WISDOM TOOTH EXTRACTION       OB History     Gravida  0   Para  0   Term  0   Preterm  0   AB  0   Living  0      SAB  0   IAB  0   Ectopic  0   Multiple  0   Live Births              Family History  Problem Relation Age of Onset   Skin cancer Father    Cancer Paternal Grandmother        metastasis   Alcohol abuse Mother    Emphysema Paternal Jon Gills        was a smoker, heart problems   Diabetes Maternal Aunt    OCD Maternal Aunt    Bipolar disorder Maternal Aunt    Stroke Maternal Grandmother    Cancer  Maternal Aunt        skin   Heart disease Neg Hx    Hypertension Neg Hx    Hyperlipidemia Neg Hx     Social History   Tobacco Use   Smoking status: Former    Packs/day: 1.00    Years: 10.00    Pack years: 10.00    Types: Cigarettes    Quit date: 11/07/2009    Years since quitting: 11.5   Smokeless tobacco: Never  Vaping Use   Vaping Use: Never used  Substance Use Topics   Alcohol use: Yes    Alcohol/week: 1.0 - 2.0 standard drink    Types: 1 - 2 Cans of beer per week    Comment: social Beer/Liquor   Drug use: No    Home Medications Prior to Admission medications   Medication Sig Start Date End Date Taking? Authorizing Provider  ALPRAZolam Duanne Moron) 0.25 MG tablet TAKE 1 TABLET BY MOUTH TWICE A DAY AS NEEDED FOR ANXIETY 02/28/21   Denita Lung, MD  baclofen (LIORESAL) 10 MG tablet Take 1 tablet (10 mg total) by mouth 3 (three) times daily. 03/07/21   Sharion Balloon, FNP  benzonatate (TESSALON) 100 MG capsule Take 1 capsule (100 mg total) by mouth 3 (three) times daily as needed for cough. 05/25/21   Brunetta Jeans, PA-C  calcium carbonate (OS-CAL) 1250 (500 Ca) MG chewable tablet Chew by mouth. Patient not taking: Reported on 03/29/2021    [provider]  cyanocobalamin (,VITAMIN B-12,) 1000 MCG/ML injection SMARTSIG:1 Milliliter(s) IM 01/31/21    [provider]  diclofenac (VOLTAREN) 75 MG EC tablet Take 1 tablet (75 mg total) by mouth 2 (two) times daily. 03/29/21   Denita Lung, MD  metoprolol succinate (TOPROL-XL) 50 MG 24 hr tablet TAKE 1 TABLET BY MOUTH DAILY. TAKE WITH OR IMMEDIATELY FOLLOWING A MEAL. 12/20/20   Leonie Man, MD  sertraline (ZOLOFT) 100 MG tablet TAKE 1 TABLET BY MOUTH EVERY DAY IN THE EVENING 02/24/21   Denita Lung, MD  SYNTHROID 88 MCG tablet TAKE 1 TABLET BY MOUTH EVERY DAY 03/23/21   Denita Lung, MD  SYRINGE-NEEDLE, DISP, 3 ML (SAFESNAP SYRINGE) 25G X 1" 3 ML MISC Use for Vitamin B12 injections per instructions 12/08/20   [provider]  valsartan (DIOVAN) 160 MG tablet Take 1 tablet (160 mg total) by mouth daily. Patient taking differently: Take 80 mg by mouth daily. 08/30/20   Leonie Man, MD  verapamil (CALAN-SR) 180 MG CR tablet Take 180 mg by mouth at bedtime. Patient not taking: Reported on 03/29/2021 11/27/20   [provider]  Vitamin D, Ergocalciferol, (DRISDOL) 1.25 MG (50000 UNIT) CAPS capsule TAKE 1 CAPSULE BY MOUTH ONE TIME PER WEEK 02/25/21   Denita Lung, MD    Allergies    Clindamycin, Codeine, Fish allergy, Iohexol, Pindolol, and Iodinated diagnostic agents  Review of Systems   Review of Systems  Constitutional:  Negative for diaphoresis and fever.  HENT:  Negative for congestion.   Eyes:  Negative for redness.  Respiratory:  Positive for shortness of breath. Negative for wheezing.   Cardiovascular:  Negative for chest pain and syncope.  Gastrointestinal:  Negative for abdominal pain.  Genitourinary:  Negative for difficulty urinating.  Musculoskeletal:  Negative for neck pain and neck stiffness.  Skin:  Negative for rash.  Neurological:  Negative for facial asymmetry.  Psychiatric/Behavioral:  Negative for agitation.   All other systems reviewed and are negative.  Physical Exam Updated Vital Signs BP (!) 146/105   Pulse (!) 111   Temp 98.2  F (36.8 C) (Oral)   Resp (!) 22   Ht 5\' 7"  (1.702 m)   Wt 74.8 kg   SpO2 100%   BMI 25.84 kg/m   Physical Exam Vitals and nursing note reviewed.  Constitutional:      Appearance: She is well-developed. She is not diaphoretic.  HENT:     Head: Normocephalic and atraumatic.     Nose: Nose normal.  Eyes:     Conjunctiva/sclera: Conjunctivae normal.     Pupils: Pupils are equal, round, and reactive to light.  Cardiovascular:     Rate and Rhythm: Regular rhythm. Tachycardia present.     Pulses: Normal pulses.     Heart sounds: Normal heart sounds.  Pulmonary:     Effort: Pulmonary effort is normal.     Breath sounds: Normal breath sounds.     Comments: Hyperventilating  Abdominal:     General: Abdomen is flat. Bowel sounds are normal.     Palpations: Abdomen is soft.     Tenderness: There is no abdominal tenderness. There is no guarding.  Musculoskeletal:        General: No tenderness.     Cervical back: Normal range of motion and neck supple.     Right lower leg: No edema.     Left lower leg: No edema.  Skin:    General: Skin is warm and dry.     Capillary Refill: Capillary refill takes less than 2 seconds.  Neurological:     General: No focal deficit present.     Mental Status: She is alert and oriented to person, place, and time.     Deep Tendon Reflexes: Reflexes normal.  Psychiatric:        Mood and Affect: Mood is anxious.     Comments: Tremulous all over, flushed     ED Results / Procedures / Treatments   Labs (all labs ordered are listed, but only abnormal results are displayed) Results for orders placed or performed during the hospital encounter of 05/30/21  Resp Panel by RT-PCR (Flu A&B, Covid) Nasopharyngeal Swab   Specimen: Nasopharyngeal Swab; Nasopharyngeal(NP) swabs in vial transport medium  Result Value Ref Range   SARS Coronavirus 2 by RT PCR POSITIVE (A) NEGATIVE   Influenza A by PCR NEGATIVE NEGATIVE   Influenza B by PCR NEGATIVE NEGATIVE   Comprehensive metabolic panel  Result Value Ref Range   Sodium 137 135 - 145 mmol/L   Potassium 3.7 3.5 - 5.1 mmol/L   Chloride 103 98 - 111 mmol/L   CO2 21 (L) 22 - 32 mmol/L   Glucose, Bld 93 70 - 99 mg/dL   BUN 10 6 - 20 mg/dL   Creatinine, Ser 0.84 0.44 - 1.00 mg/dL   Calcium 9.4 8.9 - 10.3 mg/dL   Total Protein 7.2 6.5 - 8.1 g/dL   Albumin 4.1 3.5 - 5.0 g/dL   AST 160 (H) 15 - 41 U/L   ALT 91 (H) 0 - 44 U/L   Alkaline Phosphatase 79 38 - 126 U/L   Total Bilirubin 0.7 0.3 - 1.2 mg/dL   GFR, Estimated >60 >60 mL/min   Anion gap 13 5 - 15  CBC with Differential  Result Value Ref Range   WBC 3.9 (L) 4.0 - 10.5 K/uL   RBC 3.92 3.87 - 5.11 MIL/uL   Hemoglobin 11.7 (L) 12.0 - 15.0 g/dL  HCT 36.4 36.0 - 46.0 %   MCV 92.9 80.0 - 100.0 fL   MCH 29.8 26.0 - 34.0 pg   MCHC 32.1 30.0 - 36.0 g/dL   RDW 13.3 11.5 - 15.5 %   Platelets 221 150 - 400 K/uL   nRBC 0.0 0.0 - 0.2 %   Neutrophils Relative % 51 %   Neutro Abs 2.0 1.7 - 7.7 K/uL   Lymphocytes Relative 33 %   Lymphs Abs 1.3 0.7 - 4.0 K/uL   Monocytes Relative 12 %   Monocytes Absolute 0.5 0.1 - 1.0 K/uL   Eosinophils Relative 2 %   Eosinophils Absolute 0.1 0.0 - 0.5 K/uL   Basophils Relative 1 %   Basophils Absolute 0.0 0.0 - 0.1 K/uL   Immature Granulocytes 1 %   Abs Immature Granulocytes 0.03 0.00 - 0.07 K/uL  D-dimer, quantitative  Result Value Ref Range   D-Dimer, Quant 0.55 (H) 0.00 - 0.50 ug/mL-FEU  I-Stat beta hCG blood, ED  Result Value Ref Range   I-stat hCG, quantitative <5.0 <5 mIU/mL   Comment 3          Troponin I (High Sensitivity)  Result Value Ref Range   Troponin I (High Sensitivity) 4 <18 ng/L  Troponin I (High Sensitivity)  Result Value Ref Range   Troponin I (High Sensitivity) 4 <18 ng/L   DG Chest 2 View  Result Date: 05/31/2021 CLINICAL DATA:  Shortness of breath. EXAM: CHEST - 2 VIEW COMPARISON:  Chest radiograph dated 05/24/2013. FINDINGS: No focal consolidation, pleural effusion, or  pneumothorax. The cardiac silhouette is within limits. No acute osseous pathology. IMPRESSION: No active cardiopulmonary disease. Electronically Signed   By: Anner Crete M.D.   On: 05/31/2021 00:33   CT Angio Chest PE W and/or Wo Contrast  Result Date: 05/31/2021 CLINICAL DATA:  Chest pain and shortness of breath. EXAM: CT ANGIOGRAPHY CHEST WITH CONTRAST TECHNIQUE: Multidetector CT imaging of the chest was performed using the standard protocol during bolus administration of intravenous contrast. Multiplanar CT image reconstructions and MIPs were obtained to evaluate the vascular anatomy. CONTRAST:  31mL OMNIPAQUE IOHEXOL 350 MG/ML SOLN COMPARISON:  10/05/2007 FINDINGS: Cardiovascular: The heart size is normal. No substantial pericardial effusion. Coronary artery calcification is evident. Mild atherosclerotic calcification is noted in the wall of the thoracic aorta. There is no filling defect within the opacified pulmonary arteries to suggest the presence of an acute pulmonary embolus. Mediastinum/Nodes: No mediastinal lymphadenopathy. There is no hilar lymphadenopathy. The esophagus has normal imaging features. There is no axillary lymphadenopathy. Lungs/Pleura: Centrilobular and paraseptal emphysema evident. Mild bullous changes noted right lung apex. 4 mm ground-glass nodule identified in the left apex on image 17/7. Additional 4 mm and 5 mm ground-glass opacities are seen in the left upper lobe on images 32 in 39 respectively. No focal airspace consolidation. There is no evidence of pleural effusion. Upper Abdomen: Unremarkable. Musculoskeletal: No worrisome lytic or sclerotic osseous abnormality. Review of the MIP images confirms the above findings. IMPRESSION: 1. No CT evidence for acute pulmonary embolus. 2. Scattered tiny ground-glass opacities in the left upper lobe, likely infectious/inflammatory in etiology. No follow-up needed if patient is low-risk (and has no known or suspected primary  neoplasm). Non-contrast chest CT can be considered in 12 months if patient is high-risk. This recommendation follows the consensus statement: Guidelines for Management of Incidental Pulmonary Nodules Detected on CT Images: From the Fleischner Society 2017; Radiology 2017; 284:228-243. 3. Aortic Atherosclerosis (ICD10-I70.0) and Emphysema (ICD10-J43.9). Electronically Signed  By: Misty Stanley M.D.   On: 05/31/2021 05:53     EKG EKG Interpretation  Date/Time:  Monday May 30 2021 22:55:11 EST Ventricular Rate:  89 PR Interval:  136 QRS Duration: 74 QT Interval:  376 QTC Calculation: 457 R Axis:   81 Text Interpretation: Normal sinus rhythm Normal ECG No significant change since prior 11/14 Confirmed by Aletta Edouard [phone]) on 05/30/2021 10:56:40 PM  Radiology DG Chest 2 View  Result Date: 05/31/2021 CLINICAL DATA:  Shortness of breath. EXAM: CHEST - 2 VIEW COMPARISON:  Chest radiograph dated 05/24/2013. FINDINGS: No focal consolidation, pleural effusion, or pneumothorax. The cardiac silhouette is within limits. No acute osseous pathology. IMPRESSION: No active cardiopulmonary disease. Electronically Signed   By: Anner Crete M.D.   On: 05/31/2021 00:33    Procedures Procedures   Medications Ordered in ED Medications  haloperidol lactate (HALDOL) injection 2 mg (2 mg Intravenous Given 05/31/21 0417)  sodium chloride 0.9 % bolus 500 mL (500 mLs Intravenous New Bag/Given 05/31/21 0418)  diphenhydrAMINE (BENADRYL) injection 25 mg (25 mg Intravenous Given 05/31/21 0422)    ED Course  I have reviewed the triage vital signs and the nursing notes.  Pertinent labs & imaging results that were available during my care of the patient were reviewed by me and considered in my medical decision making (see chart for details).   Discussed with Dr. Nevada Crane, no need to for premedication.  Patient states she has had many CT scans at [facility] without premedication and the rash was thought to  be due to chemotherapy.    She was given benadryl regardless. As well as haldol for anxiety.  She is relaxing post medication.    This is very clearly anxiety, based on history and exam.  She is tremulous all over and hyperventilating.  This in combination with covid is the cause of the patient's symptoms and may be the cause of patient's LFT abnormalities.  She has been ruled out for MI, heart score is 1, PE and PTX.  Molnupavir prescribed.  No allergic reacting to the contrast and patient is resting comfortably.  She is stable for discharge with close follow up.  She is advised against drinking alcohol given LFT findings and following up with PMD for repeat LFTs,  she and her husband verbalize understanding and agree to follow up.  This was also printed on the discharge paperwork.     Lisa Waters was evaluated in Emergency Department on 05/31/2021 for the symptoms described in the history of present illness. She was evaluated in the context of the global COVID-19 pandemic, which necessitated consideration that the patient might be at risk for infection with the SARS-CoV-2 virus that causes COVID-19. Institutional protocols and algorithms that pertain to the evaluation of patients at risk for COVID-19 are in a state of rapid change based on information released by regulatory bodies including the CDC and federal and state organizations. These policies and algorithms were followed during the patient's care in the ED.  Final Clinical Impression(s) / ED Diagnoses Final diagnoses:  Elevated liver function tests   Return for intractable cough, coughing up blood, fevers > 100.4 unrelieved by medication, shortness of breath, intractable vomiting, chest pain, shortness of breath, weakness, numbness, changes in speech, facial asymmetry, abdominal pain, passing out, Inability to tolerate liquids or food, cough, altered mental status or any concerns. No signs of systemic illness or infection. The patient is  nontoxic-appearing on exam and vital signs are within normal limits.  I  have reviewed the triage vital signs and the nursing notes. Pertinent labs & imaging results that were available during my care of the patient were reviewed by me and considered in my medical decision making (see chart for details). After history, exam, and medical workup I feel the patient has been appropriately medically screened and is safe for discharge home. Pertinent diagnoses were discussed with the patient. Patient was given return precautions.     Pratt, Willena Jeancharles, MD 05/31/21 [phone]

## 2021-06-01 ENCOUNTER — Encounter: Payer: Self-pay | Admitting: Family Medicine

## 2021-06-06 ENCOUNTER — Ambulatory Visit (INDEPENDENT_AMBULATORY_CARE_PROVIDER_SITE_OTHER): Payer: BC Managed Care – PPO | Admitting: Physical Therapy

## 2021-06-06 ENCOUNTER — Other Ambulatory Visit: Payer: Self-pay

## 2021-06-06 ENCOUNTER — Encounter: Payer: Self-pay | Admitting: Physical Therapy

## 2021-06-06 DIAGNOSIS — M546 Pain in thoracic spine: Secondary | ICD-10-CM | POA: Diagnosis not present

## 2021-06-06 DIAGNOSIS — M542 Cervicalgia: Secondary | ICD-10-CM

## 2021-06-06 DIAGNOSIS — M6281 Muscle weakness (generalized): Secondary | ICD-10-CM | POA: Diagnosis not present

## 2021-06-06 NOTE — Therapy (Signed)
[facility] Therapy [address] [place], [place], [address] Phone: [phone]   Fax:  [phone]  Physical Therapy Treatment  Patient Details  Name: Lisa Waters MRN: [id number] Date of Birth: [birth year] Referring Provider (PT): Jill Alexanders MD   Encounter Date: 06/06/2021   PT End of Session - 06/06/21 1112     Visit Number 7    Number of Visits 10    Date for PT Re-Evaluation 06/17/21    PT Start Time 1104    PT Stop Time 1143    PT Time Calculation (min) 39 min    Activity Tolerance Patient tolerated treatment well    Behavior During Therapy [facility] for tasks assessed/performed             Past Medical History:  Diagnosis Date   Anxiety    Asthma    mild intermittent as of 07/2017 - just when patient gets sick   Depression    Female infertility    GERD (gastroesophageal reflux disease)    Hx - no current problems, no meds   H/O amaurosis fugax Fall 2012   Resolved, no current problems, Hx-MRI of brain/brain stem: no acute abnormality, small area of encephalomalacia left superior cerebellum that could be prior trauma, infection, or lacunar infarct   History of Hodgkin's lymphoma 2007   in remission, sees [facility], prior with Dr. Elwanda [place]; prior chemotherapy and radiation therapy; sees yearly in December, no current problems   Hyperlipidemia 07/2017   diet control, no meds   Hypothyroidism    Moderate mitral regurgitation by prior echocardiogram 09/2010   10/2015: EF 55-60%. Normal WM. Normal D Fxn. Bilateral MVP w/ Mod MR (very eccentric). Normal LA, RV & RA. Normal PAP.   MVP (mitral valve prolapse) 09/2010   Dr. Ellyn Hack, [facility]; a) Echo 3/'12: Mod MR; EF 60-65%; b) TEE 5/'12: Nl LV Fxn, EF 60-65%, mild, holosystolic prolapse of the medial anterior leaflet. Mod MR . c) Echo 3/'15: Moderate, holosystolic prolapse of  medial. Mod MR d) TM Stress Echo (@ [place]) Nl Lv Fxn, Mild-Mod MR @ Res0 --> Mod MR at HR 181 bpm (7:22 min, 10.10 METS) w/p  WMA   Oral ulcer    Palpitations 10/2012   holter monitor, Dr. Ellyn Hack; no arrhythmia, no current problems    Past Surgical History:  Procedure Laterality Date   [place]  2002   COLONOSCOPY  07/2014   Dr. Collene Mares - normal   DILATATION & CURETTAGE/HYSTEROSCOPY WITH MYOSURE N/A 01/29/2018   Procedure: [place] POLYPECTOMY;  Surgeon: Charyl Bigger, MD;  Location: [facility];  Service: Gynecology;  Laterality: N/A;   ESOPHAGOGASTRODUODENOSCOPY  04/11/13   [facility] Dr. Collene Mares   FOOT SURGERY Bilateral    on toes   INSERTION CENTRAL VENOUS ACCESS DEVICE W/ SUBCUTANEOUS PORT  2006   LYMPH NODE BIOPSY  2006   under right arm   NM MYOCAR PERF WALL MOTION  12/2010   persantine myoview - moderate breast attenuation (fixed mid-distal anterior defect), EF 68%, low risk scan   RHINOPLASTY  2007   deviated septum   TEE WITHOUT CARDIOVERSION  11/2010   Nl LV Fxn, EF 60-65%, mild, holosystolic prolapse of the medial anterior leaflet. Mod MR .    TRANSTHORACIC ECHOCARDIOGRAM  3/'14; 3/'15   LVEF 60-65%, wall motion normal, LV function normal, moderate holosystolic MVP (medial the middle scallop of the posterior leaflet), moderate regurgitation (directed posteriorly), no shunt --  stable over 2 years   TRANSTHORACIC ECHOCARDIOGRAM  10/2015   EF 55-60%. Normal WM. Normal D Fxn. Bilateral MVP w/ Mod MR (very eccentric). Normal LA, RV & RA. Normal PAP.   TRANSTHORACIC ECHOCARDIOGRAM  10/22/2019   EF 55 to 60%.  Mild LVH.  GRII DD.  Elevated LAP.  Mild LA dilation.  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  Normal aortic valve.  (Stable   WISDOM TOOTH EXTRACTION      There were no vitals filed for this visit.   Subjective Assessment - 06/06/21 1109     Subjective Pt arriving today reporting 6/10 pain in her left shoulder and neck    Pertinent History double masectomy, 9-10 surgeries due to breast surgeries,anxiety,  asthma, depression, h/o amaurosis fugas, Hodgkin's Lymphoma, hypothoidism, hyperlipidemia, MVP    Patient Stated Goals Stop hurting so I can work pain free    Currently in Pain? Yes    Pain Score 6     Pain Location Shoulder    Pain Orientation Left    Pain Descriptors / Indicators Aching    Pain Type Acute pain    Pain Onset More than a month ago                               [facility] Adult PT Treatment/Exercise - 06/06/21 0001       Neck Exercises: Machines for Strengthening   UBE (Upper Arm Bike) L3 x 8 minutes      Neck Exercises: Standing   Other Standing Exercises rows level 3 band x 20      Neck Exercises: Stretches   Upper Trapezius Stretch Right;Left;2 reps;20 seconds    Levator Stretch Right;Left;2 reps;20 seconds    Other Neck Stretches cervical rotation x 3      Manual Therapy   Manual therapy comments skilled palpation of upper trap, levator, and cervical paraspinals, IASTM to rhomboids, levator, upper traps and cervical paraspinals, left scapular mobs  performed.              Trigger Point Dry Needling - 06/06/21 0001     Consent Given? Yes    Education Handout Provided No   issued previously   Muscles Treated Head and Neck Upper trapezius;Cervical multifidi;Levator scapulae    Upper Trapezius Response Twitch reponse elicited    Levator Scapulae Response Twitch response elicited    Cervical multifidi Response Twitch reponse elicited                     PT Short Term Goals - 05/02/21 1226       PT SHORT TERM GOAL #1   Title Pt will be indpendent in her initial HEP.    Status Achieved               PT Long Term Goals - 05/16/21 1222       PT LONG TERM GOAL #1   Title Pt will be independent in her advanced HEP    Status On-going      PT LONG TERM GOAL #2   Title Pt will be able to improve bilateral cervical  rotation to >/= 60 degrees.    Status On-going      PT LONG TERM GOAL #3   Title Pt will be able to  report pain of </= 2/10 in her neck when working full day.    Status On-going      PT LONG  TERM GOAL #4   Title Pt will be able to improve bilateral shoulder strength to >/= 5/5 with fleixon and abduction to improve functional mobility.    Status On-going      PT LONG TERM GOAL #5   Title Pt will improve her FOTO to >/=71%    Status On-going                   Plan - 06/06/21 1112     Clinical Impression Statement Pt arriving today reporting worsening pain over the weekend in her left shoulder and cervical spine. Pt reporting compliance in her HEP and some relief from DN in the past. DN perofrmed with IASTM and manual therapy to pt's cervical paraspinals and left levator and upper trap. Conitnue skilled PT to maximize pt's function.    Personal Factors and Comorbidities Comorbidity 3+    Comorbidities anxiety, asthma, depression, h/o amaurosis fugas, Hodgkin's Lymphoma, hypothoidism, hyperlipidemia, MVP, multiple breast surgeries    Examination-Activity Limitations Other;Sit    Examination-Participation Restrictions Community Activity;Occupation;Other    Stability/Clinical Decision Making Stable/Uncomplicated    Rehab Potential Good    PT Treatment/Interventions ADLs/Self Care Home Management;Cryotherapy;Moist Heat;Traction;Functional mobility training;Therapeutic activities;Therapeutic exercise;Neuromuscular re-education;Patient/family education;Passive range of motion;Dry needling;Manual techniques    PT Next Visit Plan response to DN, continue UBE, cervical stretching, postural exercises, thoracic strething, manual / STM, DN again if indicated    PT Home Exercise Plan Access Code: [email or web address]  URL: [email or web address]  Date: 04/04/2021  Prepared by: Kearney Hard    Exercises  Seated Assisted Cervical Rotation with Towel - 2-3 x daily - 7 x weekly - 3-5 reps - 10-15 seconds hold  3 Finger Cervical Rotation - 2-3 x daily - 7 x weekly - 3-5 reps - 10-15 seconds hold   Supine Passive Cervical Retraction - 2-3 x daily - 7 x weekly - 10 reps - 5 seconds hold  Doorway Pec Stretch at 90 Degrees Abduction - 2-3 x daily - 7 x weekly - 5 reps - 20 seconds hold    Consulted and Agree with Plan of Care Patient             Patient will benefit from skilled therapeutic intervention in order to improve the following deficits and impairments:  Pain, Postural dysfunction, Decreased strength, Improper body mechanics, Decreased mobility, Decreased activity tolerance, Impaired UE functional use, Decreased range of motion  Visit Diagnosis: Cervicalgia  Pain in thoracic spine  Muscle weakness (generalized)     Problem List Patient Active Problem List   Diagnosis Date Noted   Preop cardiovascular exam 11/29/2020   Hyperlipidemia with target LDL less than 130 08/31/2020   Essential hypertension 08/30/2020   Cancer of alimentary canal/tract ([place]) 12/08/2019   Closed fracture of distal phalanx of finger 04/17/2019   Sprain of right wrist 04/17/2019   Sleep disturbance 03/03/2019   RAD (reactive airway disease) 02/26/2019   Tachycardia 02/26/2019   History of radiation therapy 02/26/2019   Increased risk of breast cancer 02/26/2019   Vitamin D deficiency 11/12/2018   Depression, major, in remission ([place]) 11/12/2018   Aphthous ulcer 05/13/2018   High risk medication use 05/13/2018   Sore throat 12/07/2017   Acute non-recurrent maxillary sinusitis 12/07/2017   Vaccine counseling 07/26/2017   Breast mass, left 09/08/2016   Routine general medical examination at a health care facility 05/30/2016   Other specified hypothyroidism 04/01/2015   Encounter for health maintenance examination in adult 04/01/2015   Influenza vaccination declined 04/01/2015  Generalized anxiety disorder 04/01/2015   Asthma, mild intermittent 04/01/2015   Oral ulcer 04/01/2015   Biceps tendonitis on left 07/27/2014   Subacromial or subdeltoid bursitis 07/27/2014   Impingement  syndrome of left shoulder 06/09/2014   Left shoulder pain 05/26/2014   Major depression, recurrent ([place]) 05/25/2013   Allergic rhinitis 01/20/2013   Recurrent infections 01/20/2013   Moderate mitral regurgitation by prior echocardiogram 12/28/2012   Palpitation 10/25/2012   Female infertility of other specified origin 04/09/2012   Raynauds disease 06/29/2011   History of nodular sclerosis Hodgkin's disease 06/29/2011   Hodgkin's disease in remission ([place]) 12/29/2010   MVP (mitral valve prolapse) 12/29/2010    Oretha Caprice, PT,MPT 06/06/2021, 12:47 PM  [facility] Physical Therapy [address] [place], [place], [address] Phone: [phone]   Fax:  [phone]  Name: Lisa Waters MRN: [id number] Date of Birth: 06-20-1981

## 2021-06-13 ENCOUNTER — Encounter: Payer: Self-pay | Admitting: Physical Therapy

## 2021-06-13 ENCOUNTER — Other Ambulatory Visit: Payer: Self-pay

## 2021-06-13 ENCOUNTER — Ambulatory Visit (INDEPENDENT_AMBULATORY_CARE_PROVIDER_SITE_OTHER): Payer: BC Managed Care – PPO | Admitting: Physical Therapy

## 2021-06-13 DIAGNOSIS — M6281 Muscle weakness (generalized): Secondary | ICD-10-CM

## 2021-06-13 DIAGNOSIS — M546 Pain in thoracic spine: Secondary | ICD-10-CM

## 2021-06-13 DIAGNOSIS — M542 Cervicalgia: Secondary | ICD-10-CM | POA: Diagnosis not present

## 2021-06-13 NOTE — Therapy (Signed)
[facility] Physical Therapy [address] [place], [place], [address] Phone: [phone]   Fax:  [phone]  Physical Therapy Treatment  Patient Details  Name: Lisa Waters MRN: [id number] Date of Birth: 1981/03/21 Referring Provider (PT): Jill Alexanders MD   Encounter Date: 06/13/2021   PT End of Session - 06/13/21 1128     Visit Number 8    Number of Visits 10    Date for PT Re-Evaluation 06/17/21    PT Start Time 1100    PT Stop Time 7681    PT Time Calculation (min) 44 min    Activity Tolerance Patient tolerated treatment well    Behavior During Therapy [facility] for tasks assessed/performed             Past Medical History:  Diagnosis Date   Anxiety    Asthma    mild intermittent as of 07/2017 - just when patient gets sick   Depression    Female infertility    GERD (gastroesophageal reflux disease)    Hx - no current problems, no meds   H/O amaurosis fugax Fall 2012   Resolved, no current problems, Hx-MRI of brain/brain stem: no acute abnormality, small area of encephalomalacia left superior cerebellum that could be prior trauma, infection, or lacunar infarct   History of Hodgkin's lymphoma 2007   in remission, sees [facility], prior with Dr. Elwanda [place]; prior chemotherapy and radiation therapy; sees yearly in December, no current problems   Hyperlipidemia 07/2017   diet control, no meds   Hypothyroidism    Moderate mitral regurgitation by prior echocardiogram 09/2010   10/2015: EF 55-60%. Normal WM. Normal D Fxn. Bilateral MVP w/ Mod MR (very eccentric). Normal LA, RV & RA. Normal PAP.   MVP (mitral valve prolapse) 09/2010   Dr. Ellyn Hack, [facility]; a) Echo 3/'12: Mod MR; EF 60-65%; b) TEE 5/'12: Nl LV Fxn, EF 60-65%, mild, holosystolic prolapse of the medial anterior leaflet. Mod MR . c) Echo 3/'15: Moderate, holosystolic prolapse of  medial. Mod MR d) TM Stress Echo (@ [place]) Nl Lv Fxn, Mild-Mod MR @ Res0 --> Mod MR at HR 181 bpm (7:22 min, 10.10 METS) w/p  WMA   Oral ulcer    Palpitations 10/2012   holter monitor, Dr. Ellyn Hack; no arrhythmia, no current problems    Past Surgical History:  Procedure Laterality Date   [place]  2002   COLONOSCOPY  07/2014   Dr. Collene Mares - normal   DILATATION & CURETTAGE/HYSTEROSCOPY WITH MYOSURE N/A 01/29/2018   Procedure: [place] POLYPECTOMY;  Surgeon: Charyl Bigger, MD;  Location: [facility];  Service: Gynecology;  Laterality: N/A;   ESOPHAGOGASTRODUODENOSCOPY  04/11/13   [facility] Dr. Collene Mares   FOOT SURGERY Bilateral    on toes   INSERTION CENTRAL VENOUS ACCESS DEVICE W/ SUBCUTANEOUS PORT  2006   LYMPH NODE BIOPSY  2006   under right arm   NM MYOCAR PERF WALL MOTION  12/2010   persantine myoview - moderate breast attenuation (fixed mid-distal anterior defect), EF 68%, low risk scan   RHINOPLASTY  2007   deviated septum   TEE WITHOUT CARDIOVERSION  11/2010   Nl LV Fxn, EF 60-65%, mild, holosystolic prolapse of the medial anterior leaflet. Mod MR .    TRANSTHORACIC ECHOCARDIOGRAM  3/'14; 3/'15   LVEF 60-65%, wall motion normal, LV function normal, moderate holosystolic MVP (medial the middle scallop of the posterior leaflet), moderate regurgitation (directed posteriorly), no shunt --  stable over 2 years   TRANSTHORACIC ECHOCARDIOGRAM  10/2015   EF 55-60%. Normal WM. Normal D Fxn. Bilateral MVP w/ Mod MR (very eccentric). Normal LA, RV & RA. Normal PAP.   TRANSTHORACIC ECHOCARDIOGRAM  10/22/2019   EF 55 to 60%.  Mild LVH.  GRII DD.  Elevated LAP.  Mild LA dilation.  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  Normal aortic valve.  (Stable   WISDOM TOOTH EXTRACTION      There were no vitals filed for this visit.   Subjective Assessment - 06/13/21 1103     Subjective Pt reporting 6/10 pain in her cervical spine and mid thoracic region. Pt stating her pain can jump from different areas and radiate into shoulder.    Pertinent  History double masectomy, 9-10 surgeries due to breast surgeries,anxiety, asthma, depression, h/o amaurosis fugas, Hodgkin's Lymphoma, hypothoidism, hyperlipidemia, MVP    Patient Stated Goals Stop hurting so I can work pain free    Currently in Pain? Yes    Pain Score 6     Pain Location Neck    Pain Orientation Left;Right   more on the left   Pain Descriptors / Indicators Aching    Pain Type Acute pain                [facility] PT Assessment - 06/13/21 0001       Assessment   Medical Diagnosis cervicalgia M54.2, M54.6 throacic pain    Referring Provider (PT) Jill Alexanders MD    Hand Dominance Right      AROM   Cervical Flexion 28    Cervical Extension 25    Cervical - Right Side Bend 25    Cervical - Left Side Bend 26    Cervical - Right Rotation 56    Cervical - Left Rotation 65                           [facility] Adult PT Treatment/Exercise - 06/13/21 0001       Neck Exercises: Standing   Other Standing Exercises rows level 3 band x 20      Neck Exercises: Stretches   Upper Trapezius Stretch Right;Left;2 reps;20 seconds    Levator Stretch Right;Left;2 reps;20 seconds    Other Neck Stretches cervical rotation x 3      Modalities   Modalities Traction;Moist Heat      Moist Heat Therapy   Number Minutes Moist Heat 5 Minutes    Moist Heat Location Cervical      Traction   Type of Traction Cervical    Min (lbs) 8    Max (lbs) 14    Hold Time cervical protocol    Rest Time cervical protocol    Time 20 minutes                       PT Short Term Goals - 05/02/21 1226       PT SHORT TERM GOAL #1   Title Pt will be indpendent in her initial HEP.    Status Achieved               PT Long Term Goals - 06/13/21 1133       PT LONG TERM GOAL #1   Title Pt will be independent in her advanced HEP    Status On-going      PT LONG TERM GOAL #2   Title Pt will be able to improve  bilateral cervical  rotation to >/= 60 degrees.     Status On-going      PT LONG TERM GOAL #3   Title Pt will be able to report pain of </= 2/10 in her neck when working full day.    Status On-going      PT LONG TERM GOAL #5   Title Pt will improve her FOTO to >/=71%    Status On-going                   Plan - 06/13/21 1128     Clinical Impression Statement Pt arriving today reporting no relief from the DN at her last visit. Pt still in 6/10 pain upon arrival. Cervical traction performed today with good response at end of session. Continue skilled PT to maximize pt's function.    Comorbidities anxiety, asthma, depression, h/o amaurosis fugas, Hodgkin's Lymphoma, hypothoidism, hyperlipidemia, MVP, multiple breast surgeries    Examination-Activity Limitations Other;Sit    Examination-Participation Restrictions Community Activity;Occupation;Other    Stability/Clinical Decision Making Stable/Uncomplicated    PT Treatment/Interventions ADLs/Self Care Home Management;Cryotherapy;Moist Heat;Traction;Functional mobility training;Therapeutic activities;Therapeutic exercise;Neuromuscular re-education;Patient/family education;Passive range of motion;Dry needling;Manual techniques    PT Next Visit Plan response to cervical mechanical traction,  continue UBE, cervical stretching, postural exercises, thoracic strething, manual / STM, DN again if indicated    PT Home Exercise Plan Access Code: [email or web address]  URL: [email or web address]  Date: 04/04/2021  Prepared by: Kearney Hard    Exercises  Seated Assisted Cervical Rotation with Towel - 2-3 x daily - 7 x weekly - 3-5 reps - 10-15 seconds hold  3 Finger Cervical Rotation - 2-3 x daily - 7 x weekly - 3-5 reps - 10-15 seconds hold  Supine Passive Cervical Retraction - 2-3 x daily - 7 x weekly - 10 reps - 5 seconds hold  Doorway Pec Stretch at 90 Degrees Abduction - 2-3 x daily - 7 x weekly - 5 reps - 20 seconds hold    Consulted and Agree with Plan of Care Patient              Patient will benefit from skilled therapeutic intervention in order to improve the following deficits and impairments:  Pain, Postural dysfunction, Decreased strength, Improper body mechanics, Decreased mobility, Decreased activity tolerance, Impaired UE functional use, Decreased range of motion  Visit Diagnosis: Cervicalgia  Pain in thoracic spine  Muscle weakness (generalized)     Problem List Patient Active Problem List   Diagnosis Date Noted   Preop cardiovascular exam 11/29/2020   Hyperlipidemia with target LDL less than 130 08/31/2020   Essential hypertension 08/30/2020   Cancer of alimentary canal/tract ([place]) 12/08/2019   Closed fracture of distal phalanx of finger 04/17/2019   Sprain of right wrist 04/17/2019   Sleep disturbance 03/03/2019   RAD (reactive airway disease) 02/26/2019   Tachycardia 02/26/2019   History of radiation therapy 02/26/2019   Increased risk of breast cancer 02/26/2019   Vitamin D deficiency 11/12/2018   Depression, major, in remission ([place]) 11/12/2018   Aphthous ulcer 05/13/2018   High risk medication use 05/13/2018   Sore throat 12/07/2017   Acute non-recurrent maxillary sinusitis 12/07/2017   Vaccine counseling 07/26/2017   Breast mass, left 09/08/2016   Routine general medical examination at a health care facility 05/30/2016   Other specified hypothyroidism 04/01/2015   Encounter for health maintenance examination in adult 04/01/2015   Influenza vaccination declined 04/01/2015   Generalized anxiety disorder 04/01/2015   Asthma, mild intermittent 04/01/2015  Oral ulcer 04/01/2015   Biceps tendonitis on left 07/27/2014   Subacromial or subdeltoid bursitis 07/27/2014   Impingement syndrome of left shoulder 06/09/2014   Left shoulder pain 05/26/2014   Major depression, recurrent ([place]) 05/25/2013   Allergic rhinitis 01/20/2013   Recurrent infections 01/20/2013   Moderate mitral regurgitation by prior echocardiogram 12/28/2012    Palpitation 10/25/2012   Female infertility of other specified origin 04/09/2012   Raynauds disease 06/29/2011   History of nodular sclerosis Hodgkin's disease 06/29/2011   Hodgkin's disease in remission ([place]) 12/29/2010   MVP (mitral valve prolapse) 12/29/2010    Oretha Caprice, PT, MPT 06/13/2021, 12:00 PM  [facility] Physical Therapy [address] [place], [place], [address] Phone: [phone]   Fax:  [phone]  Name: Lisa Waters MRN: [id number] Date of Birth: [birth year]

## 2021-06-15 DIAGNOSIS — Z9189 Other specified personal risk factors, not elsewhere classified: Secondary | ICD-10-CM | POA: Diagnosis not present

## 2021-06-15 DIAGNOSIS — Z9013 Acquired absence of bilateral breasts and nipples: Secondary | ICD-10-CM | POA: Diagnosis not present

## 2021-06-20 ENCOUNTER — Other Ambulatory Visit: Payer: Self-pay

## 2021-06-20 ENCOUNTER — Encounter: Payer: Self-pay | Admitting: Physical Therapy

## 2021-06-20 ENCOUNTER — Ambulatory Visit (INDEPENDENT_AMBULATORY_CARE_PROVIDER_SITE_OTHER): Payer: BC Managed Care – PPO | Admitting: Physical Therapy

## 2021-06-20 DIAGNOSIS — M6281 Muscle weakness (generalized): Secondary | ICD-10-CM | POA: Diagnosis not present

## 2021-06-20 DIAGNOSIS — M542 Cervicalgia: Secondary | ICD-10-CM | POA: Diagnosis not present

## 2021-06-20 DIAGNOSIS — M546 Pain in thoracic spine: Secondary | ICD-10-CM | POA: Diagnosis not present

## 2021-06-20 NOTE — Patient Instructions (Signed)
Access Code: [email or web address] URL: [email or web address] Date: 06/20/2021 Prepared by: Kearney Hard  Exercises Seated Assisted Cervical Rotation with Towel - 2-3 x daily - 7 x weekly - 3-5 reps - 10-15 seconds hold 3 Finger Cervical Rotation - 2-3 x daily - 7 x weekly - 3-5 reps - 10-15 seconds hold Supine Passive Cervical Retraction - 2-3 x daily - 7 x weekly - 10 reps - 5 seconds hold Doorway Pec Stretch at 90 Degrees Abduction - 2-3 x daily - 7 x weekly - 5 reps - 20 seconds hold Standing Lower Cervical and Upper Thoracic Stretch - 1 x daily - 7 x weekly - 3-5 reps - 20 se onds hold Quadruped Thoracic Rotation Full Range with Hand on Neck - 1 x daily - 7 x weekly - 5 reps Standing Bilateral Low Shoulder Row with Anchored Resistance - 1 x daily - 7 x weekly - 3 sets - 10 reps

## 2021-06-20 NOTE — Therapy (Signed)
[facility] Physical Therapy [address] [place], [place], [address] Phone: [phone]   Fax:  [phone]  Physical Therapy Treatment Recertification Discharge Summary  Patient Details  Name: Lisa Waters MRN: [id number] Date of Birth: 1980/08/28 Referring Provider (PT): Jill Alexanders MD   Encounter Date: 06/20/2021   PT End of Session - 06/20/21 1124     Visit Number 9    Number of Visits 10    Date for PT Re-Evaluation 06/17/21    Authorization Type Recertifcation performed on 06/20/2021 for one visit and HEP update    PT Start Time 1100    PT Stop Time 1140    PT Time Calculation (min) 40 min    Activity Tolerance Patient tolerated treatment well    Behavior During Therapy [facility] for tasks assessed/performed             Past Medical History:  Diagnosis Date   Anxiety    Asthma    mild intermittent as of 07/2017 - just when patient gets sick   Depression    Female infertility    GERD (gastroesophageal reflux disease)    Hx - no current problems, no meds   H/O amaurosis fugax Fall 2012   Resolved, no current problems, Hx-MRI of brain/brain stem: no acute abnormality, small area of encephalomalacia left superior cerebellum that could be prior trauma, infection, or lacunar infarct   History of Hodgkin's lymphoma 2007   in remission, sees [facility], prior with Dr. Elwanda [place]; prior chemotherapy and radiation therapy; sees yearly in December, no current problems   Hyperlipidemia 07/2017   diet control, no meds   Hypothyroidism    Moderate mitral regurgitation by prior echocardiogram 09/2010   10/2015: EF 55-60%. Normal WM. Normal D Fxn. Bilateral MVP w/ Mod MR (very eccentric). Normal LA, RV & RA. Normal PAP.   MVP (mitral valve prolapse) 09/2010   Dr. Ellyn Hack, [facility]; a) Echo 3/'12: Mod MR; EF 60-65%; b) TEE 5/'12: Nl LV Fxn, EF 60-65%, mild, holosystolic prolapse of the medial anterior leaflet. Mod MR . c) Echo 3/'15: Moderate, holosystolic prolapse of   medial. Mod MR d) TM Stress Echo (@ [place]) Nl Lv Fxn, Mild-Mod MR @ Res0 --> Mod MR at HR 181 bpm (7:22 min, 10.10 METS) w/p WMA   Oral ulcer    Palpitations 10/2012   holter monitor, Dr. Ellyn Hack; no arrhythmia, no current problems    Past Surgical History:  Procedure Laterality Date   [place]  2002   COLONOSCOPY  07/2014   Dr. Collene Mares - normal   DILATATION & CURETTAGE/HYSTEROSCOPY WITH MYOSURE N/A 01/29/2018   Procedure: [place] POLYPECTOMY;  Surgeon: Charyl Bigger, MD;  Location: [place];  Service: Gynecology;  Laterality: N/A;   ESOPHAGOGASTRODUODENOSCOPY  04/11/13   [facility] Dr. Collene Mares   FOOT SURGERY Bilateral    on toes   INSERTION CENTRAL VENOUS ACCESS DEVICE W/ SUBCUTANEOUS PORT  2006   LYMPH NODE BIOPSY  2006   under right arm   NM MYOCAR PERF WALL MOTION  12/2010   persantine myoview - moderate breast attenuation (fixed mid-distal anterior defect), EF 68%, low risk scan   RHINOPLASTY  2007   deviated septum   TEE WITHOUT CARDIOVERSION  11/2010   Nl LV Fxn, EF 60-65%, mild, holosystolic prolapse of the medial anterior leaflet. Mod MR .    TRANSTHORACIC ECHOCARDIOGRAM  3/'14; 3/'15   LVEF 60-65%, wall motion normal, LV function  normal, moderate holosystolic MVP (medial the middle scallop of the posterior leaflet), moderate regurgitation (directed posteriorly), no shunt -- stable over 2 years   TRANSTHORACIC ECHOCARDIOGRAM  10/2015   EF 55-60%. Normal WM. Normal D Fxn. Bilateral MVP w/ Mod MR (very eccentric). Normal LA, RV & RA. Normal PAP.   TRANSTHORACIC ECHOCARDIOGRAM  10/22/2019   EF 55 to 60%.  Mild LVH.  GRII DD.  Elevated LAP.  Mild LA dilation.  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  Normal aortic valve.  (Stable   WISDOM TOOTH EXTRACTION      There were no vitals filed for this visit.   Subjective Assessment - 06/20/21 1627     Subjective Pt reporting cervical traction seemed  to help short term after leaving the clinic last visit. Pt stating she has been taking muscle relaxer to help her sleep.    Pertinent History double masectomy, 9-10 surgeries due to breast surgeries,anxiety, asthma, depression, h/o amaurosis fugas, Hodgkin's Lymphoma, hypothoidism, hyperlipidemia, MVP    Limitations Sitting    Currently in Pain? Yes    Pain Score 4     Pain Location Neck    Pain Orientation Right;Left    Pain Descriptors / Indicators Aching;Sore    Pain Type Acute pain    Pain Onset More than a month ago    Pain Frequency Constant                [facility] PT Assessment - 06/20/21 0001       Assessment   Medical Diagnosis cervicalgia M54.2, M54.6 throacic pain    Referring Provider (PT) Jill Alexanders MD      Observation/Other Assessments   Focus on Therapeutic Outcomes (FOTO)  63%      AROM   AROM Assessment Site Cervical    Cervical Flexion 38    Cervical Extension 45    Cervical - Right Side Bend 30    Cervical - Left Side Bend 25    Cervical - Right Rotation 60    Cervical - Left Rotation 64      Strength   Strength Assessment Site Shoulder    Right/Left Shoulder Right;Left    Right Shoulder Flexion 4+/5    Right Shoulder Extension 5/5    Right Shoulder ABduction 4+/5    Right Shoulder Internal Rotation 5/5    Right Shoulder External Rotation 5/5    Left Shoulder Flexion 4+/5    Left Shoulder Extension 5/5    Left Shoulder ABduction 4+/5    Left Shoulder Internal Rotation 5/5    Left Shoulder External Rotation 5/5                           [facility] Adult PT Treatment/Exercise - 06/20/21 0001       Neck Exercises: Stretches   Upper Trapezius Stretch --    Levator Stretch --    Corner Stretch 2 reps;20 seconds    Other Neck Stretches cervical rotation x 3    Other Neck Stretches cerical and throacic rotation in quadraped each direction      Modalities   Modalities Traction      Moist Heat Therapy   Number Minutes Moist Heat  --    Moist Heat Location --      Traction   Type of Traction Cervical    Min (lbs) 8    Max (lbs) 15    Hold Time cervical protocol    Rest Time cervical  protocol    Time 20 minutes                     PT Education - 06/20/21 1204     Education Details updated HEP and progression, green and blue theraband issued to pt    Person(s) Educated Patient    Methods Explanation;Demonstration;Handout;Verbal cues    Comprehension Returned demonstration;Verbalized understanding              PT Short Term Goals - 05/02/21 1226       PT SHORT TERM GOAL #1   Title Pt will be indpendent in her initial HEP.    Status Achieved               PT Long Term Goals - 06/20/21 1630       PT LONG TERM GOAL #1   Title Pt will be independent in her advanced HEP    Status Achieved      PT LONG TERM GOAL #2   Title Pt will be able to improve bilateral cervical  rotation to >/= 60 degrees.    Status Achieved      PT LONG TERM GOAL #3   Title Pt will be able to report pain of </= 2/10 in her neck when working full day.    Status Not Met      PT LONG TERM GOAL #4   Title Pt will be able to improve bilateral shoulder strength to >/= 5/5 with fleixon and abduction to improve functional mobility.    Baseline Pt still progressing to 5/5 shoulder strength. Pt limited by pain with MMT.    Status Partially Met      PT LONG TERM GOAL #5   Title Pt will improve her FOTO to >/=71%    Baseline 59% at evaluation, 63% on 06/20/2021    Status Not Met                   Plan - 06/20/21 1144     Clinical Impression Statement Pt arriving to therapy still reporting 4/10 cervial pain. Pt stating mechanical traction helped short term but then her pain returned and the same with DN. Pt's HEP was updated and pt is being referred back to her MD for further evaluation. Pt reporting her overall pain has not changed, with FOTO score only increasing by 4% to 63% from 59% at evaluation.  Pt has made progress with overall cervcial ROM and mild strength gains in her bilateral UE's. Pt is still progressing toward 5/5 strength. I am requesting a certification to cover today's treatment/visit and then pt will be  discharged pt from skilled PT with an updated HEP and recommending MD follow up.    Personal Factors and Comorbidities Comorbidity 3+    Comorbidities anxiety, asthma, depression, h/o amaurosis fugas, Hodgkin's Lymphoma, hypothoidism, hyperlipidemia, MVP, multiple breast surgeries    Examination-Activity Limitations Other;Sit    Examination-Participation Restrictions Community Activity;Occupation;Other    Stability/Clinical Decision Making Stable/Uncomplicated    Rehab Potential Good    PT Treatment/Interventions ADLs/Self Care Home Management;Cryotherapy;Moist Heat;Traction;Functional mobility training;Therapeutic activities;Therapeutic exercise;Neuromuscular re-education;Patient/family education;Passive range of motion;Dry needling;Manual techniques    PT Next Visit Plan response to cervical mechanical traction,  continue UBE, cervical stretching, postural exercises, thoracic strething, manual / STM, DN again if indicated    PT Home Exercise Plan Access Code: [email or web address]  URL: [email or web address]  Date: 06/20/2021  Prepared by: Kearney Hard    Exercises  Seated Assisted Cervical Rotation  with Towel - 2-3 x daily - 7 x weekly - 3-5 reps - 10-15 seconds hold  3 Finger Cervical Rotation - 2-3 x daily - 7 x weekly - 3-5 reps - 10-15 seconds hold  Supine Passive Cervical Retraction - 2-3 x daily - 7 x weekly - 10 reps - 5 seconds hold  Doorway Pec Stretch at 90 Degrees Abduction - 2-3 x daily - 7 x weekly - 5 reps - 20 seconds hold  Standing Lower Cervical and Upper Thoracic Stretch - 1 x daily - 7 x weekly - 3-5 reps - 20 se onds hold  Quadruped Thoracic Rotation Full Range with Hand on Neck - 1 x daily - 7 x weekly - 5 reps  Standing Bilateral Low Shoulder Row with  Anchored Resistance - 1 x daily - 7 x weekly - 3 sets - 10 reps    Consulted and Agree with Plan of Care Patient             Patient will benefit from skilled therapeutic intervention in order to improve the following deficits and impairments:  Pain, Postural dysfunction, Decreased strength, Improper body mechanics, Decreased mobility, Decreased activity tolerance, Impaired UE functional use, Decreased range of motion  Visit Diagnosis: Cervicalgia  Pain in thoracic spine  Muscle weakness (generalized)     Problem List Patient Active Problem List   Diagnosis Date Noted   Preop cardiovascular exam 11/29/2020   Hyperlipidemia with target LDL less than 130 08/31/2020   Essential hypertension 08/30/2020   Cancer of alimentary canal/tract ([place]) 12/08/2019   Closed fracture of distal phalanx of finger 04/17/2019   Sprain of right wrist 04/17/2019   Sleep disturbance 03/03/2019   RAD (reactive airway disease) 02/26/2019   Tachycardia 02/26/2019   History of radiation therapy 02/26/2019   Increased risk of breast cancer 02/26/2019   Vitamin D deficiency 11/12/2018   Depression, major, in remission ([place]) 11/12/2018   Aphthous ulcer 05/13/2018   High risk medication use 05/13/2018   Sore throat 12/07/2017   Acute non-recurrent maxillary sinusitis 12/07/2017   Vaccine counseling 07/26/2017   Breast mass, left 09/08/2016   Routine general medical examination at a health care facility 05/30/2016   Other specified hypothyroidism 04/01/2015   Encounter for health maintenance examination in adult 04/01/2015   Influenza vaccination declined 04/01/2015   Generalized anxiety disorder 04/01/2015   Asthma, mild intermittent 04/01/2015   Oral ulcer 04/01/2015   Biceps tendonitis on left 07/27/2014   Subacromial or subdeltoid bursitis 07/27/2014   Impingement syndrome of left shoulder 06/09/2014   Left shoulder pain 05/26/2014   Major depression, recurrent ([place]) 05/25/2013   Allergic  rhinitis 01/20/2013   Recurrent infections 01/20/2013   Moderate mitral regurgitation by prior echocardiogram 12/28/2012   Palpitation 10/25/2012   Female infertility of other specified origin 04/09/2012   Raynauds disease 06/29/2011   History of nodular sclerosis Hodgkin's disease 06/29/2011   Hodgkin's disease in remission ([place]) 12/29/2010   MVP (mitral valve prolapse) 12/29/2010    Oretha Caprice, PT, MPT 06/20/2021, 4:40 PM  [facility] Physical Therapy [address] [place], [place], [address] Phone: [phone]   Fax:  [phone]  Name: Lashonda Sonneborn MRN: [id number] Date of Birth: 1981/06/29  PHYSICAL THERAPY DISCHARGE SUMMARY  Visits from Start of Care: 9  Current functional level related to goals / functional outcomes: see above    Remaining deficits: See above   Education / Equipment: HEP   Patient agrees to discharge. Patient goals were partially met.  Patient is being discharged due to lack of progress.

## 2021-06-21 ENCOUNTER — Other Ambulatory Visit: Payer: Self-pay

## 2021-06-21 ENCOUNTER — Encounter: Payer: Self-pay | Admitting: Family Medicine

## 2021-06-21 DIAGNOSIS — M546 Pain in thoracic spine: Secondary | ICD-10-CM

## 2021-06-21 DIAGNOSIS — M542 Cervicalgia: Secondary | ICD-10-CM

## 2021-06-27 ENCOUNTER — Encounter: Payer: BC Managed Care – PPO | Admitting: Physical Therapy

## 2021-06-30 ENCOUNTER — Ambulatory Visit (INDEPENDENT_AMBULATORY_CARE_PROVIDER_SITE_OTHER): Payer: BC Managed Care – PPO | Admitting: Surgery

## 2021-06-30 ENCOUNTER — Encounter: Payer: Self-pay | Admitting: Surgery

## 2021-06-30 ENCOUNTER — Other Ambulatory Visit: Payer: Self-pay

## 2021-06-30 VITALS — BP 87/54 | HR 92 | Ht 67.0 in | Wt 165.0 lb

## 2021-06-30 DIAGNOSIS — M5412 Radiculopathy, cervical region: Secondary | ICD-10-CM | POA: Diagnosis not present

## 2021-06-30 DIAGNOSIS — M542 Cervicalgia: Secondary | ICD-10-CM | POA: Diagnosis not present

## 2021-06-30 NOTE — Progress Notes (Signed)
Office Visit Note   Patient: Lisa Waters           Date of Birth: 1980/10/12           MRN: [id number] Visit Date: 06/30/2021              Requested by: Denita Lung, MD [place],  [place] [address] PCP: Denita Lung, MD   Assessment & Plan: Visit Diagnoses:  1. Radiculopathy, cervical region     Plan: Since patient has failed at least 3 months conservative management by her primary care provider I recommend getting a cervical spine MRI to rule out HNP/stenosis.  Patient will follow-up with Dr. Lorin Mercy after completion of the study to discuss results and further treatment options.  No medication given today.  Follow-Up Instructions: Return in about 3 weeks (around 07/21/2021) for with dr yates to review cervical mri scan.   Orders:  No orders of the defined types were placed in this encounter.  No orders of the defined types were placed in this encounter.     Procedures: No procedures performed   Clinical Data: No additional findings.   Subjective: Chief Complaint  Patient presents with   Neck - Pain   Lower Back - Pain    HPI 40 year old white female is being seen for chronic neck pain/scapular pain and occasional bilateral hand numbness and tingling.  She has been followed by her primary care provider Dr. Jill Alexanders for this.  He ordered x-rays of the cervical and thoracic spine that were done March 29, 2021 and those reports read:  [place] 4+ VIEW   COMPARISON:  Radiographs 01/25/2017.   FINDINGS: There is mild straightening of the usual cervical lordosis, similar to previous study. The prevertebral soft tissues are normal. There is no evidence of acute fracture or traumatic subluxation. The disc spaces are preserved. There is no significant osseous foraminal narrowing.   IMPRESSION: Stable examination. No acute osseous findings or significant spondylosis.     Electronically Signed   By: Richardean Sale  M.D.   On: 03/30/2021 13:35   EXAM: THORACIC SPINE - 3 VIEWS   COMPARISON:  Radiographs 01/25/2017.   FINDINGS: There are 12 rib-bearing thoracic type vertebral bodies with small ribs at T12. The alignment is stable with a mild scoliosis. No evidence of acute fracture, paraspinal hematoma or widening of the interpedicular distance. Mild thoracic spine degenerative changes are noted.   IMPRESSION: Mild thoracic spondylosis and scoliosis.  No acute osseous findings.     Electronically Signed   By: Richardean Sale M.D.   On: 03/30/2021 13:34  Since September 2022 Dr. Redmond School has been managing this patient conservatively with formal PT, oral NSAIDs, muscle relaxer without improvement.  He has not ordered a cervical MRI. Patient states that she does have pain in her neck with a somewhat constant burning pain between her shoulder blades along with the occasional bilateral hand numbness and tingling that comes and goes.     Review of Systems   Objective: Vital Signs: BP (!) 87/54    Pulse 92    Ht 5\' 7"  (1.702 m)    Wt 165 lb (74.8 kg)    BMI 25.84 kg/m   Physical Exam HENT:     Head: Normocephalic and atraumatic.     Nose: Nose normal.  Pulmonary:     Effort: No respiratory distress.  Musculoskeletal:     Comments: Gait is normal.  Patient has good cervical  spine range of motion but with some discomfort.  Positive left brachial plexus tenderness.  Positive bilateral trapezius and scapular tenderness.  Bilateral shoulder exam unremarkable.  No focal motor deficits.  Neurological:     General: No focal deficit present.     Mental Status: She is alert and oriented to person, place, and time.    Ortho Exam  Specialty Comments:  No specialty comments available.  Imaging: No results found.   PMFS History: Patient Active Problem List   Diagnosis Date Noted   Preop cardiovascular exam 11/29/2020   Hyperlipidemia with target LDL less than 130 08/31/2020   Essential  hypertension 08/30/2020   Cancer of alimentary canal/tract ([place]) 12/08/2019   Closed fracture of distal phalanx of finger 04/17/2019   Sprain of right wrist 04/17/2019   Sleep disturbance 03/03/2019   RAD (reactive airway disease) 02/26/2019   Tachycardia 02/26/2019   History of radiation therapy 02/26/2019   Increased risk of breast cancer 02/26/2019   Vitamin D deficiency 11/12/2018   Depression, major, in remission ([place]) 11/12/2018   Aphthous ulcer 05/13/2018   High risk medication use 05/13/2018   Sore throat 12/07/2017   Acute non-recurrent maxillary sinusitis 12/07/2017   Vaccine counseling 07/26/2017   Breast mass, left 09/08/2016   Routine general medical examination at a health care facility 05/30/2016   Other specified hypothyroidism 04/01/2015   Encounter for health maintenance examination in adult 04/01/2015   Influenza vaccination declined 04/01/2015   Generalized anxiety disorder 04/01/2015   Asthma, mild intermittent 04/01/2015   Oral ulcer 04/01/2015   Biceps tendonitis on left 07/27/2014   Subacromial or subdeltoid bursitis 07/27/2014   Impingement syndrome of left shoulder 06/09/2014   Left shoulder pain 05/26/2014   Major depression, recurrent ([place]) 05/25/2013   Allergic rhinitis 01/20/2013   Recurrent infections 01/20/2013   Moderate mitral regurgitation by prior echocardiogram 12/28/2012   Palpitation 10/25/2012   Female infertility of other specified origin 04/09/2012   Raynauds disease 06/29/2011   History of nodular sclerosis Hodgkin's disease 06/29/2011   Hodgkin's disease in remission ([place]) 12/29/2010   MVP (mitral valve prolapse) 12/29/2010   Past Medical History:  Diagnosis Date   Anxiety    Asthma    mild intermittent as of 07/2017 - just when patient gets sick   Depression    Female infertility    GERD (gastroesophageal reflux disease)    Hx - no current problems, no meds   H/O amaurosis fugax Fall 2012   Resolved, no current problems,  Hx-MRI of brain/brain stem: no acute abnormality, small area of encephalomalacia left superior cerebellum that could be prior trauma, infection, or lacunar infarct   History of Hodgkin's lymphoma 2007   in remission, sees [facility], prior with Dr. Elwanda [place]; prior chemotherapy and radiation therapy; sees yearly in December, no current problems   Hyperlipidemia 07/2017   diet control, no meds   Hypothyroidism    Moderate mitral regurgitation by prior echocardiogram 09/2010   10/2015: EF 55-60%. Normal WM. Normal D Fxn. Bilateral MVP w/ Mod MR (very eccentric). Normal LA, RV & RA. Normal PAP.   MVP (mitral valve prolapse) 09/2010   Dr. Ellyn Hack, [facility]; a) Echo 3/'12: Mod MR; EF 60-65%; b) TEE 5/'12: Nl LV Fxn, EF 60-65%, mild, holosystolic prolapse of the medial anterior leaflet. Mod MR . c) Echo 3/'15: Moderate, holosystolic prolapse of  medial. Mod MR d) TM Stress Echo (@ [facility]) Nl Lv Fxn, Mild-Mod MR @ Res0 --> Mod MR at HR 181 bpm (  7:22 min, 10.10 METS) w/p WMA   Oral ulcer    Palpitations 10/2012   holter monitor, Dr. Ellyn Hack; no arrhythmia, no current problems    Family History  Problem Relation Age of Onset   Skin cancer Father    Cancer Paternal Grandmother        metastasis   Alcohol abuse Mother    Emphysema Paternal Grandfather        was a smoker, heart problems   Diabetes Maternal Aunt    OCD Maternal Aunt    Bipolar disorder Maternal Aunt    Stroke Maternal Grandmother    Cancer Maternal Aunt        skin   Heart disease Neg Hx    Hypertension Neg Hx    Hyperlipidemia Neg Hx     Past Surgical History:  Procedure Laterality Date   [place] SURGERY  2002   COLONOSCOPY  07/2014   Dr. Collene Mares - normal   [place] N/A 01/29/2018   Procedure: DILATATION & CURETTAGE/HYSTEROSCOPY WITH MYOSURE POLYPECTOMY;  Surgeon: Charyl Bigger, MD;  Location: [facility];  Service: Gynecology;  Laterality: N/A;    ESOPHAGOGASTRODUODENOSCOPY  04/11/13   [facility] Dr. Collene Mares   FOOT SURGERY Bilateral    on toes   INSERTION CENTRAL VENOUS ACCESS DEVICE W/ SUBCUTANEOUS PORT  2006   LYMPH NODE BIOPSY  2006   under right arm   NM MYOCAR PERF WALL MOTION  12/2010   persantine myoview - moderate breast attenuation (fixed mid-distal anterior defect), EF 68%, low risk scan   RHINOPLASTY  2007   deviated septum   TEE WITHOUT CARDIOVERSION  11/2010   Nl LV Fxn, EF 60-65%, mild, holosystolic prolapse of the medial anterior leaflet. Mod MR .    TRANSTHORACIC ECHOCARDIOGRAM  3/'14; 3/'15   LVEF 60-65%, wall motion normal, LV function normal, moderate holosystolic MVP (medial the middle scallop of the posterior leaflet), moderate regurgitation (directed posteriorly), no shunt -- stable over 2 years   TRANSTHORACIC ECHOCARDIOGRAM  10/2015   EF 55-60%. Normal WM. Normal D Fxn. Bilateral MVP w/ Mod MR (very eccentric). Normal LA, RV & RA. Normal PAP.   TRANSTHORACIC ECHOCARDIOGRAM  10/22/2019   EF 55 to 60%.  Mild LVH.  GRII DD.  Elevated LAP.  Mild LA dilation.  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  Normal aortic valve.  (Stable   WISDOM TOOTH EXTRACTION     Social History   Occupational History   Occupation: works in Engineer, production: [employer]  Tobacco Use   Smoking status: Former    Packs/day: 1.00    Years: 10.00    Pack years: 10.00    Types: Cigarettes    Quit date: 11/07/2009    Years since quitting: 11.6   Smokeless tobacco: Never  Vaping Use   Vaping Use: Never used  Substance and Sexual Activity   Alcohol use: Yes    Alcohol/week: 1.0 - 2.0 standard drink    Types: 1 - 2 Cans of beer per week    Comment: social Beer/Liquor   Drug use: No   Sexual activity: Yes    Partners: Male    Birth control/protection: None

## 2021-07-04 ENCOUNTER — Encounter: Payer: BC Managed Care – PPO | Admitting: Physical Therapy

## 2021-07-04 MED ORDER — BACLOFEN 10 MG PO TABS
10.0000 mg | ORAL_TABLET | Freq: Three times a day (TID) | ORAL | 0 refills | Status: DC
Start: 2021-07-04 — End: 2021-09-09

## 2021-07-19 ENCOUNTER — Ambulatory Visit
Admission: RE | Admit: 2021-07-19 | Discharge: 2021-07-19 | Disposition: A | Discharge status: Home / Self Care | Payer: BC Managed Care – PPO | Source: Ambulatory Visit | Attending: Surgery | Admitting: Surgery

## 2021-07-19 ENCOUNTER — Other Ambulatory Visit: Payer: Self-pay

## 2021-07-19 DIAGNOSIS — M542 Cervicalgia: Secondary | ICD-10-CM | POA: Diagnosis not present

## 2021-07-19 DIAGNOSIS — R200 Anesthesia of skin: Secondary | ICD-10-CM | POA: Diagnosis not present

## 2021-07-19 DIAGNOSIS — R202 Paresthesia of skin: Secondary | ICD-10-CM | POA: Diagnosis not present

## 2021-07-20 DIAGNOSIS — R87615 Unsatisfactory cytologic smear of cervix: Secondary | ICD-10-CM | POA: Diagnosis not present

## 2021-07-22 ENCOUNTER — Ambulatory Visit: Payer: BC Managed Care – PPO | Admitting: Orthopaedic Surgery

## 2021-07-22 ENCOUNTER — Encounter: Payer: Self-pay | Admitting: Orthopaedic Surgery

## 2021-07-22 ENCOUNTER — Other Ambulatory Visit: Payer: Self-pay

## 2021-07-22 VITALS — BP 99/63 | Ht 67.0 in | Wt 165.0 lb

## 2021-07-22 DIAGNOSIS — M4722 Other spondylosis with radiculopathy, cervical region: Secondary | ICD-10-CM

## 2021-07-22 DIAGNOSIS — M7542 Impingement syndrome of left shoulder: Secondary | ICD-10-CM

## 2021-07-24 DIAGNOSIS — M7542 Impingement syndrome of left shoulder: Secondary | ICD-10-CM | POA: Diagnosis not present

## 2021-07-24 DIAGNOSIS — M4722 Other spondylosis with radiculopathy, cervical region: Secondary | ICD-10-CM | POA: Insufficient documentation

## 2021-07-24 MED ORDER — METHYLPREDNISOLONE ACETATE 40 MG/ML IJ SUSP
40.0000 mg | INTRAMUSCULAR | Status: AC | PRN
  Administered 2021-07-24: 40 mg via INTRA_ARTICULAR

## 2021-07-24 MED ORDER — BUPIVACAINE HCL 0.25 % IJ SOLN
4.0000 mL | INTRAMUSCULAR | Status: AC | PRN
  Administered 2021-07-24: 4 mL via INTRA_ARTICULAR

## 2021-07-24 MED ORDER — LIDOCAINE HCL 1 % IJ SOLN
0.5000 mL | INTRAMUSCULAR | Status: AC | PRN
  Administered 2021-07-24: .5 mL

## 2021-07-24 NOTE — Progress Notes (Signed)
Office Visit Note   Patient: Lisa Waters           Date of Birth: 22-Jul-1980           MRN: [id number] Visit Date: 07/22/2021              Requested by: Lisa Lung, MD Lisa Waters,  [place] [id number] PCP: Lisa Lung, MD   Assessment & Plan: Visit Diagnoses:  1. Impingement syndrome of left shoulder   2. Other spondylosis with radiculopathy, cervical region     Plan: Patient does have disc herniation at C6-7 on the left.  Her shoulder seems more symptomatic than her neck with radicular symptoms currently and subacromial injection was performed.  She tolerated the injection well seemed to help to some degree we will see how this does she can return if she is not doing well and having ongoing symptoms.  Follow-Up Instructions: No follow-ups on file.   Orders:  No orders of the defined types were placed in this encounter.  No orders of the defined types were placed in this encounter.     Procedures: Large Joint Inj on 07/24/2021 11:22 AM Indications: pain Details: 22 G 1.5 in needle  Arthrogram: No  Medications: 4 mL bupivacaine 0.25 %; 40 mg methylPREDNISolone acetate 40 MG/ML; 0.5 mL lidocaine 1 % Outcome: tolerated well, no immediate complications Procedure, treatment alternatives, risks and benefits explained, specific risks discussed. Consent was given by the patient. Immediately prior to procedure a time out was called to verify the correct patient, procedure, equipment, support staff and site/side marked as required. Patient was prepped and draped in the usual sterile fashion.      Clinical Data: No additional findings.   Subjective: Chief Complaint  Patient presents with   Neck - Pain, Follow-up    MRI review   Left Arm - Pain, Follow-up    HPI 41 year old female returns with ongoing problems with some neck pain and primarily arm pain shoulder pain.  She has pain with range of motion no results with muscle relaxant.  She had  previous double mastectomy she had a few oxycodone left and states that did not give her relief.  Pain with outstretch reaching overhead activity numbness that radiates down to middle fingers as well as small finger of her left hand more than right.  MRI scan 07/19/2021 showed degenerative spondylosis C4-C7 with disc bulges at C4-5 and C5-6.  C6-7 had shallow left disc herniation.  Review of Systems all other systems updated unchanged from previous note.  Of note is history of breast cancer with multiple surgeries and ultimately reconstruction.   Objective: Vital Signs: BP 99/63    Ht 5\' 7"  (1.702 m)    Wt 165 lb (74.8 kg)    BMI 25.84 kg/m   Physical Exam Constitutional:      Appearance: She is well-developed.  HENT:     Head: Normocephalic.     Right Ear: External ear normal.     Left Ear: External ear normal. There is no impacted cerumen.  Eyes:     Pupils: Pupils are equal, round, and reactive to light.  Neck:     Thyroid: No thyromegaly.     Trachea: No tracheal deviation.  Cardiovascular:     Rate and Rhythm: Normal rate.  Pulmonary:     Effort: Pulmonary effort is normal.  Abdominal:     Palpations: Abdomen is soft.  Musculoskeletal:     Cervical back: No rigidity.  Skin:    General: Skin is warm and dry.  Neurological:     Mental Status: She is alert and oriented to person, place, and time.  Psychiatric:        Behavior: Behavior normal.    Ortho Exam patient has brachial plexus tenderness positive impingement long head of biceps is minimally tender.  Reflexes are 2+ some decreased sensation left long finger.  Negative Phalen's test at the wrist negative carpal compression.  Negative speeds test negative lift off.  Specialty Comments:  No specialty comments available.  Imaging: No results found.   PMFS History: Patient Active Problem List   Diagnosis Date Noted   Other spondylosis with radiculopathy, cervical region 07/24/2021   Preop cardiovascular exam  11/29/2020   Hyperlipidemia with target LDL less than 130 08/31/2020   Essential hypertension 08/30/2020   Cancer of alimentary canal/tract (Lisa Waters) 12/08/2019   Closed fracture of distal phalanx of finger 04/17/2019   Sprain of right wrist 04/17/2019   Sleep disturbance 03/03/2019   RAD (reactive airway disease) 02/26/2019   Tachycardia 02/26/2019   History of radiation therapy 02/26/2019   Increased risk of breast cancer 02/26/2019   Vitamin D deficiency 11/12/2018   Depression, major, in remission (Lisa Waters) 11/12/2018   Aphthous ulcer 05/13/2018   High risk medication use 05/13/2018   Sore throat 12/07/2017   Acute non-recurrent maxillary sinusitis 12/07/2017   Vaccine counseling 07/26/2017   Breast mass, left 09/08/2016   Routine general medical examination at a health care facility 05/30/2016   Other specified hypothyroidism 04/01/2015   Encounter for health maintenance examination in adult 04/01/2015   Influenza vaccination declined 04/01/2015   Generalized anxiety disorder 04/01/2015   Asthma, mild intermittent 04/01/2015   Oral ulcer 04/01/2015   Biceps tendonitis on left 07/27/2014   Subacromial or subdeltoid bursitis 07/27/2014   Impingement syndrome of left shoulder 06/09/2014   Left shoulder pain 05/26/2014   Major depression, recurrent (Lisa Waters) 05/25/2013   Allergic rhinitis 01/20/2013   Recurrent infections 01/20/2013   Moderate mitral regurgitation by prior echocardiogram 12/28/2012   Palpitation 10/25/2012   Female infertility of other specified origin 04/09/2012   Raynauds disease 06/29/2011   History of nodular sclerosis Hodgkin's disease 06/29/2011   Hodgkin's disease in remission (Lisa Waters) 12/29/2010   MVP (mitral valve prolapse) 12/29/2010   Past Medical History:  Diagnosis Date   Anxiety    Asthma    mild intermittent as of 07/2017 - just when patient gets sick   Depression    Female infertility    GERD (gastroesophageal reflux disease)    Hx - no current  problems, no meds   H/O amaurosis fugax Fall 2012   Resolved, no current problems, Hx-MRI of brain/brain stem: no acute abnormality, small area of encephalomalacia left superior cerebellum that could be prior trauma, infection, or lacunar infarct   History of Hodgkin's lymphoma 2007   in remission, sees [facility], prior with Dr. Elwanda Brooklyn; prior chemotherapy and radiation therapy; sees yearly in December, no current problems   Hyperlipidemia 07/2017   diet control, no meds   Hypothyroidism    Moderate mitral regurgitation by prior echocardiogram 09/2010   10/2015: EF 55-60%. Normal WM. Normal D Fxn. Bilateral MVP w/ Mod MR (very eccentric). Normal LA, RV & RA. Normal PAP.   MVP (mitral valve prolapse) 09/2010   Dr. Ellyn Hack, [facility]; a) Echo 3/'12: Mod MR; EF 60-65%; b) TEE 5/'12: Nl LV Fxn, EF 60-65%, mild, holosystolic prolapse of the medial anterior leaflet. Mod MR .  c) Echo 3/'15: Moderate, holosystolic prolapse of  medial. Mod MR d) TM Stress Echo (@ [place]) Nl Lv Fxn, Mild-Mod MR @ Res0 --> Mod MR at HR 181 bpm (7:22 min, 10.10 METS) w/p WMA   Oral ulcer    Palpitations 10/2012   holter monitor, Dr. Ellyn Hack; no arrhythmia, no current problems    Family History  Problem Relation Age of Onset   Skin cancer Father    Cancer Paternal Grandmother        metastasis   Alcohol abuse Mother    Emphysema Paternal Grandfather        was a smoker, heart problems   Diabetes Maternal Aunt    OCD Maternal Aunt    Bipolar disorder Maternal Aunt    Stroke Maternal Grandmother    Cancer Maternal Aunt        skin   Heart disease Neg Hx    Hypertension Neg Hx    Hyperlipidemia Neg Hx     Past Surgical History:  Procedure Laterality Date   [place] SURGERY  2002   COLONOSCOPY  07/2014   Dr. Collene Mares - normal   DILATATION & CURETTAGE/HYSTEROSCOPY WITH MYOSURE N/A 01/29/2018   Procedure: DILATATION & CURETTAGE/HYSTEROSCOPY WITH MYOSURE POLYPECTOMY;  Surgeon: Charyl Bigger, MD;  Location: [facility];  Service: Gynecology;  Laterality: N/A;   ESOPHAGOGASTRODUODENOSCOPY  04/11/13   [facility] Dr. Collene Mares   FOOT SURGERY Bilateral    on toes   INSERTION CENTRAL VENOUS ACCESS DEVICE W/ SUBCUTANEOUS PORT  2006   LYMPH NODE BIOPSY  2006   under right arm   NM MYOCAR PERF WALL MOTION  12/2010   persantine myoview - moderate breast attenuation (fixed mid-distal anterior defect), EF 68%, low risk scan   RHINOPLASTY  2007   deviated septum   TEE WITHOUT CARDIOVERSION  11/2010   Nl LV Fxn, EF 60-65%, mild, holosystolic prolapse of the medial anterior leaflet. Mod MR .    TRANSTHORACIC ECHOCARDIOGRAM  3/'14; 3/'15   LVEF 60-65%, wall motion normal, LV function normal, moderate holosystolic MVP (medial the middle scallop of the posterior leaflet), moderate regurgitation (directed posteriorly), no shunt -- stable over 2 years   TRANSTHORACIC ECHOCARDIOGRAM  10/2015   EF 55-60%. Normal WM. Normal D Fxn. Bilateral MVP w/ Mod MR (very eccentric). Normal LA, RV & RA. Normal PAP.   TRANSTHORACIC ECHOCARDIOGRAM  10/22/2019   EF 55 to 60%.  Mild LVH.  GRII DD.  Elevated LAP.  Mild LA dilation.  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  Normal aortic valve.  (Stable   WISDOM TOOTH EXTRACTION     Social History   Occupational History   Occupation: works in Engineer, production: [employer]  Tobacco Use   Smoking status: Former    Packs/day: 1.00    Years: 10.00    Pack years: 10.00    Types: Cigarettes    Quit date: 11/07/2009    Years since quitting: 11.7   Smokeless tobacco: Never  Vaping Use   Vaping Use: Never used  Substance and Sexual Activity   Alcohol use: Yes    Alcohol/week: 1.0 - 2.0 standard drink    Types: 1 - 2 Cans of beer per week    Comment: social Beer/Liquor   Drug use: No   Sexual activity: Yes    Partners: Male    Birth control/protection: None

## 2021-07-24 NOTE — Addendum Note (Signed)
Addended by: [name] on: 07/24/2021 11:26 AM   Modules accepted: Orders

## 2021-07-25 ENCOUNTER — Encounter: Payer: Self-pay | Admitting: Orthopaedic Surgery

## 2021-08-05 ENCOUNTER — Encounter: Payer: Self-pay | Admitting: Orthopaedic Surgery

## 2021-08-05 ENCOUNTER — Other Ambulatory Visit: Payer: Self-pay

## 2021-08-05 ENCOUNTER — Ambulatory Visit: Payer: BC Managed Care – PPO | Admitting: Orthopaedic Surgery

## 2021-08-05 ENCOUNTER — Ambulatory Visit: Payer: Self-pay

## 2021-08-05 VITALS — BP 95/60 | HR 76 | Ht 67.0 in | Wt 165.0 lb

## 2021-08-05 DIAGNOSIS — M5020 Other cervical disc displacement, unspecified cervical region: Secondary | ICD-10-CM

## 2021-08-05 DIAGNOSIS — M7542 Impingement syndrome of left shoulder: Secondary | ICD-10-CM | POA: Diagnosis not present

## 2021-08-05 DIAGNOSIS — M4722 Other spondylosis with radiculopathy, cervical region: Secondary | ICD-10-CM | POA: Diagnosis not present

## 2021-08-05 MED ORDER — TRAMADOL HCL 50 MG PO TABS: 50.0000 mg | ORAL_TABLET | Freq: Two times a day (BID) | ORAL | 0 refills | Status: DC | PRN

## 2021-08-05 NOTE — Progress Notes (Signed)
Office Visit Note   Patient: Lisa Waters           Date of Birth: Dec 15, 1980           MRN: [id number] Visit Date: 08/05/2021              Requested by: Denita Lung, MD [place],  [place] [id number] PCP: Denita Lung, MD   Assessment & Plan: Visit Diagnoses:  1. Impingement syndrome of left shoulder   2. Other spondylosis with radiculopathy, cervical region   3. Protrusion of cervical intervertebral disc     Plan: We discussed treatment options.  She states this is giving her significant pain and problems for several months without relief.  We discussed possible epidural steroid injection.  Patient been treated with muscle relaxants anti-inflammatories oxycodone without relief.  Patient states she wants to proceed with surgery and is not able to tolerate continued symptoms.  Plan to be single level anterior cervical fusion at C6-7.  Procedure discussed with overnight stay 6 weeks in a collar.  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  Questions were elicited and answered.  Dysphagia dysphonia discussed.  MRI scan images and report again reviewed with patient and decision made for proceeding with surgery .  Follow-Up Instructions: No follow-ups on file.   Orders:  Orders Placed This Encounter  Procedures   XR Shoulder Left   Meds ordered this encounter  Medications   traMADol (ULTRAM) 50 MG tablet    Sig: Take 1 tablet (50 mg total) by mouth every 12 (twelve) hours as needed.    Dispense:  30 tablet    Refill:  0      Procedures: No procedures performed   Clinical Data: No additional findings.   Subjective: Chief Complaint  Patient presents with   Neck - Pain, Follow-up   Left Arm - Pain, Follow-up    HPI 41 year old female returns with ongoing pain in her neck radiating into her left shoulder down to the long finger of her left hand.   Minimal symptoms on the right.  She has noticed slight weakness with pushing with her left arm.  Previous subacromial injection [id number] gave her no relief.  She has pain with turning and rotating her neck.  Previous MRI showed spondylosis with shallow left disc herniation at C6-7 consistent with her symptoms.  Review of Systems past history of breast cancer with multiple surgeries and then ultimate reconstruction surgery.  History of hypertension depression, history of Hodgkin's disease.  All other systems noncontributory to HPI.   Objective: Vital Signs: BP 95/60    Pulse 76    Ht 5\' 7"  (1.702 m)    Wt 165 lb (74.8 kg)    BMI 25.84 kg/m   Physical Exam Constitutional:      Appearance: She is well-developed.  HENT:     Head: Normocephalic.     Right Ear: External ear normal.     Left Ear: External ear normal. There is no impacted cerumen.  Eyes:     Pupils: Pupils are equal, round, and reactive to light.  Neck:     Thyroid: No thyromegaly.     Trachea: No tracheal deviation.  Cardiovascular:     Rate and Rhythm: Normal rate.  Pulmonary:     Effort: Pulmonary effort is normal.  Abdominal:     Palpations: Abdomen is soft.  Musculoskeletal:     Cervical back: No rigidity.  Skin:    General: Skin is warm and dry.  Neurological:     Mental Status: She is alert and oriented to person, place, and time.  Psychiatric:        Behavior: Behavior normal.    Ortho Exam patient has mild impingement left shoulder.  Positive brachial plexus tenderness increased pain with cervical compression positive Spurling on the left.  Upper extremity reflexes are 1+ and symmetrical.  No triceps atrophy trace triceps weakness on the left.  No wrist flexion finger extension weakness.  Carpal tunnel compression negative.  Negative Phalen's test.  Specialty Comments:  No specialty comments available.  Imaging: CLINICAL DATA:  Chronic neck pain, scapular pain and bilateral hand numbness and tingling.  Symptoms primarily on the left.   EXAM: MRI CERVICAL SPINE WITHOUT CONTRAST   TECHNIQUE: Multiplanar, multisequence MR imaging of the cervical spine was performed. No intravenous contrast was administered.   COMPARISON:  Radiography 03/29/2021   FINDINGS: Alignment: Straightening of the normal cervical lordosis.   Vertebrae: No fracture or focal bone lesion.   Cord: No cord compression or focal cord lesion.   Posterior Fossa, vertebral arteries, paraspinal tissues: Normal   Disc levels:   The foramen magnum is widely patent. There is ordinary mild osteoarthritis of the C1-2 articulation but no encroachment upon the neural structures.   C2-3: Normal interspace   C3-4: Normal interspace   C4-5: Bulging of the disc. This narrows the ventral subarachnoid space but does not affect the cord or show any foraminal encroachment.   C5-6: Bulging of the disc. This narrows the ventral subarachnoid space but does not affect the cord or show any foraminal encroachment.   C6-7: Shallow disc herniation more prominent towards the left. Effacement of the ventral subarachnoid space but no compression of the cord. AP diameter of the canal in the midline measures 1 cm. Fragment on the right is widely patent. Mild proximal foraminal encroachment on the left, but without visible compression of the left C7 nerve. It is possible the left C7 nerve irritation could occur.   C7-T1: Normal interspace.   IMPRESSION: Straightening of the normal cervical lordosis, with degenerative cervical spondylosis at C4-5, C5-6 and C6-7. At C4-5 and C5-6, there are disc bulges but no compressive narrowing of the canal or foramina. At C6-7, there is a shallow disc herniation slightly more prominent towards the left. This effaces the ventral subarachnoid space but does not compress or deform the cord. There is mild proximal foraminal encroachment on the left. There is no visible compression of the left C7  nerve, but that nerve could possibly be irritated.     Electronically Signed   By: Nelson Chimes M.D.   On: 07/19/2021 08:55   PMFS History: Patient Active Problem List   Diagnosis Date Noted   Protrusion of cervical intervertebral disc 08/08/2021   Other spondylosis with radiculopathy, cervical region 07/24/2021   Preop cardiovascular exam 11/29/2020   Hyperlipidemia with target LDL less than 130 08/31/2020   Essential hypertension 08/30/2020   Cancer of alimentary canal/tract ([place]) 12/08/2019   Closed fracture of distal phalanx of finger 04/17/2019   Sprain of right wrist 04/17/2019   Sleep disturbance 03/03/2019   RAD (reactive airway disease) 02/26/2019   Tachycardia 02/26/2019   History of radiation therapy 02/26/2019   Increased risk of breast cancer 02/26/2019   Vitamin D deficiency 11/12/2018   Depression, major, in remission ([place]) 11/12/2018   Aphthous  ulcer 05/13/2018   High risk medication use 05/13/2018   Sore throat 12/07/2017   Acute non-recurrent maxillary sinusitis 12/07/2017   Vaccine counseling 07/26/2017   Breast mass, left 09/08/2016   Routine general medical examination at a health care facility 05/30/2016   Other specified hypothyroidism 04/01/2015   Encounter for health maintenance examination in adult 04/01/2015   Influenza vaccination declined 04/01/2015   Generalized anxiety disorder 04/01/2015   Asthma, mild intermittent 04/01/2015   Oral ulcer 04/01/2015   Biceps tendonitis on left 07/27/2014   Subacromial or subdeltoid bursitis 07/27/2014   Impingement syndrome of left shoulder 06/09/2014   Left shoulder pain 05/26/2014   Major depression, recurrent ([place]) 05/25/2013   Allergic rhinitis 01/20/2013   Recurrent infections 01/20/2013   Moderate mitral regurgitation by prior echocardiogram 12/28/2012   Palpitation 10/25/2012   Female infertility of other specified origin 04/09/2012   Raynauds disease 06/29/2011   History of nodular sclerosis  Hodgkin's disease 06/29/2011   Hodgkin's disease in remission ([place]) 12/29/2010   MVP (mitral valve prolapse) 12/29/2010   Past Medical History:  Diagnosis Date   Anxiety    Asthma    mild intermittent as of 07/2017 - just when patient gets sick   Depression    Female infertility    GERD (gastroesophageal reflux disease)    Hx - no current problems, no meds   H/O amaurosis fugax Fall 2012   Resolved, no current problems, Hx-MRI of brain/brain stem: no acute abnormality, small area of encephalomalacia left superior cerebellum that could be prior trauma, infection, or lacunar infarct   History of Hodgkin's lymphoma 2007   in remission, sees [facility], prior with Dr. Elwanda Brooklyn; prior chemotherapy and radiation therapy; sees yearly in December, no current problems   Hyperlipidemia 07/2017   diet control, no meds   Hypothyroidism    Moderate mitral regurgitation by prior echocardiogram 09/2010   10/2015: EF 55-60%. Normal WM. Normal D Fxn. Bilateral MVP w/ Mod MR (very eccentric). Normal LA, RV & RA. Normal PAP.   MVP (mitral valve prolapse) 09/2010   Dr. Ellyn Hack, [facility]; a) Echo 3/'12: Mod MR; EF 60-65%; b) TEE 5/'12: Nl LV Fxn, EF 60-65%, mild, holosystolic prolapse of the medial anterior leaflet. Mod MR . c) Echo 3/'15: Moderate, holosystolic prolapse of  medial. Mod MR d) TM Stress Echo (@ [place]) Nl Lv Fxn, Mild-Mod MR @ Res0 --> Mod MR at HR 181 bpm (7:22 min, 10.10 METS) w/p WMA   Oral ulcer    Palpitations 10/2012   holter monitor, Dr. Ellyn Hack; no arrhythmia, no current problems    Family History  Problem Relation Age of Onset   Skin cancer Father    Cancer Paternal Grandmother        metastasis   Alcohol abuse Mother    Emphysema Paternal Grandfather        was a smoker, heart problems   Diabetes Maternal Aunt    OCD Maternal Aunt    Bipolar disorder Maternal Aunt    Stroke Maternal Grandmother    Cancer Maternal Aunt        skin   Heart disease Neg Hx    Hypertension Neg Hx     Hyperlipidemia Neg Hx     Past Surgical History:  Procedure Laterality Date   [place] SURGERY  2002   COLONOSCOPY  07/2014   Dr. Collene Mares - normal   DILATATION & CURETTAGE/HYSTEROSCOPY WITH MYOSURE N/A 01/29/2018   Procedure: DILATATION &  CURETTAGE/HYSTEROSCOPY WITH MYOSURE POLYPECTOMY;  Surgeon: Charyl Bigger, MD;  Location: [facility];  Service: Gynecology;  Laterality: N/A;   ESOPHAGOGASTRODUODENOSCOPY  04/11/13   [facility] Dr. Collene Mares   FOOT SURGERY Bilateral    on toes   INSERTION CENTRAL VENOUS ACCESS DEVICE W/ SUBCUTANEOUS PORT  2006   LYMPH NODE BIOPSY  2006   under right arm   NM MYOCAR PERF WALL MOTION  12/2010   persantine myoview - moderate breast attenuation (fixed mid-distal anterior defect), EF 68%, low risk scan   RHINOPLASTY  2007   deviated septum   TEE WITHOUT CARDIOVERSION  11/2010   Nl LV Fxn, EF 60-65%, mild, holosystolic prolapse of the medial anterior leaflet. Mod MR .    TRANSTHORACIC ECHOCARDIOGRAM  3/'14; 3/'15   LVEF 60-65%, wall motion normal, LV function normal, moderate holosystolic MVP (medial the middle scallop of the posterior leaflet), moderate regurgitation (directed posteriorly), no shunt -- stable over 2 years   TRANSTHORACIC ECHOCARDIOGRAM  10/2015   EF 55-60%. Normal WM. Normal D Fxn. Bilateral MVP w/ Mod MR (very eccentric). Normal LA, RV & RA. Normal PAP.   TRANSTHORACIC ECHOCARDIOGRAM  10/22/2019   EF 55 to 60%.  Mild LVH.  GRII DD.  Elevated LAP.  Mild LA dilation.  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  Normal aortic valve.  (Stable   WISDOM TOOTH EXTRACTION     Social History   Occupational History   Occupation: works in Engineer, production: [employer]  Tobacco Use   Smoking status: Former    Packs/day: 1.00    Years: 10.00    Pack years: 10.00    Types: Cigarettes    Quit date: 11/07/2009    Years since quitting: 11.7   Smokeless tobacco: Never  Vaping Use   Vaping Use: Never used   Substance and Sexual Activity   Alcohol use: Yes    Alcohol/week: 1.0 - 2.0 standard drink    Types: 1 - 2 Cans of beer per week    Comment: social Beer/Liquor   Drug use: No   Sexual activity: Yes    Partners: Male    Birth control/protection: None

## 2021-08-08 DIAGNOSIS — M5020 Other cervical disc displacement, unspecified cervical region: Secondary | ICD-10-CM | POA: Insufficient documentation

## 2021-08-10 ENCOUNTER — Other Ambulatory Visit: Payer: Self-pay | Admitting: Orthopaedic Surgery

## 2021-08-10 NOTE — Telephone Encounter (Signed)
noted 

## 2021-08-11 ENCOUNTER — Other Ambulatory Visit: Payer: Self-pay | Admitting: Orthopaedic Surgery

## 2021-08-15 DIAGNOSIS — Z9889 Other specified postprocedural states: Secondary | ICD-10-CM | POA: Diagnosis not present

## 2021-08-16 ENCOUNTER — Other Ambulatory Visit: Payer: Self-pay

## 2021-08-19 ENCOUNTER — Other Ambulatory Visit: Payer: Self-pay | Admitting: Family Medicine

## 2021-08-19 DIAGNOSIS — F411 Generalized anxiety disorder: Secondary | ICD-10-CM

## 2021-08-19 NOTE — Telephone Encounter (Signed)
LVM - needs future appointment scheduled

## 2021-08-20 ENCOUNTER — Other Ambulatory Visit: Payer: Self-pay | Admitting: Cardiology

## 2021-08-23 ENCOUNTER — Encounter: Payer: BC Managed Care – PPO | Admitting: Family Medicine

## 2021-08-29 ENCOUNTER — Telehealth: Payer: Self-pay | Admitting: *Deleted

## 2021-08-29 NOTE — Telephone Encounter (Signed)
° °  Name: Lisa Waters  DOB: 08-31-1980  MRN: [id number]   Primary Cardiologist: Glenetta Hew, MD  Chart reviewed as part of pre-operative protocol coverage. Patient was contacted 08/29/2021 in reference to pre-operative risk assessment for pending surgery as outlined below.  Lisa Waters was last seen 11/2020 by Dr. Ellyn Hack, primarily followed for history of MVP with moderate MR and sinus tachycardia. At that visit she was cleared for an unrelated procedure. Echo ordered for 10/2021. Last echo 10/2019 EF 55-60%, grade 2 DD, moderate MR. Nuc 12/2010 was low risk. Stress echo 2015 without EKG changes, no WMA with stress, mild-moderate MR at rest and moderate with exercise. Seen in ED 05/2021 with SOB in context of anxiety over dry needling, patient was concerned about pneumothorax. Troponins negative. Sx felt due to anxiety + Covid. RCRI is 0.4% indicating low CV risk. Called pt to assess symptoms. Got VM. LMTCB.  Rashan Patient PA-C

## 2021-08-29 NOTE — Telephone Encounter (Signed)
° °  Pre-operative Risk Assessment    Patient Name: Lisa Waters  DOB: 17-Dec-1980 MRN: [id number]      Request for Surgical Clearance    Procedure:   CERVICAL FUSION  C6-7 ANTERIOR   Date of Surgery:  Clearance 09/09/21                                 Surgeon:  DR. MARK YATES Surgeon's Group or Practice Name:  [facility] Phone number:  [phone] Fax number:  [phone]   Type of Clearance Requested:   - Medical    Type of Anesthesia:  General    Additional requests/questions:    Jiles Prows   08/29/2021, 11:44 AM

## 2021-08-31 NOTE — Telephone Encounter (Signed)
Patient is returning call.  °

## 2021-08-31 NOTE — Telephone Encounter (Signed)
° °  Patient Name: Lisa Waters  DOB: Aug 27, 1980 MRN: [id number]  Primary Cardiologist: Glenetta Hew, MD  Chart reviewed as part of pre-operative protocol coverage. Patient returned call. The patient affirms she has been doing well without any new cardiac symptoms. Able to achieve over 4 METS without angina or dyspnea. Primary limiting factor is neck issues for which surgery is planned, no cardiac symptoms with physical exertion. Therefore, based on ACC/AHA guidelines, the patient would be at acceptable risk for the planned procedure without further cardiovascular testing. The patient was advised that if she develops new symptoms prior to surgery to contact our office to arrange for a follow-up visit, and she verbalized understanding.  Will route this bundled recommendation to requesting provider via Epic fax function. Please call with questions.   Charlie Pitter, PA-C 08/31/2021, 2:54 PM

## 2021-09-01 ENCOUNTER — Encounter: Payer: Self-pay | Admitting: Surgery

## 2021-09-01 ENCOUNTER — Ambulatory Visit: Payer: BC Managed Care – PPO | Admitting: Surgery

## 2021-09-01 ENCOUNTER — Other Ambulatory Visit: Payer: Self-pay

## 2021-09-01 ENCOUNTER — Other Ambulatory Visit: Payer: Self-pay | Admitting: Family Medicine

## 2021-09-01 VITALS — BP 125/77 | HR 84 | Ht 67.0 in | Wt 165.0 lb

## 2021-09-01 DIAGNOSIS — M5412 Radiculopathy, cervical region: Secondary | ICD-10-CM

## 2021-09-01 NOTE — Progress Notes (Signed)
Office Visit Note   Patient: Lisa Waters           Date of Birth: 05-07-1981           MRN: [id number] Visit Date: 09/01/2021              Requested by: Denita Lung, MD [place],  [place] [address] PCP: Denita Lung, MD   Assessment & Plan: Visit Diagnoses:  1. Radiculopathy, cervical region   C6-7 HNP/stenosis  Plan: We will proceed with C6-7 ACDF as scheduled.  Surgical procedure along with potential rehab/recovery time discussed with patient and husband in great detail.  All questions answered.  Follow-Up Instructions: Return for needs return office visit with dr Lorin Mercy one week postop.   Orders:  No orders of the defined types were placed in this encounter.  No orders of the defined types were placed in this encounter.     Procedures: No procedures performed   Clinical Data: No additional findings.   Subjective: Chief Complaint  Patient presents with   Neck - Pain    H&P 09/09/2021 C6-7 ACDF-[facility]    HPI 41 year old white female history of C6-7 HNP/stenosis comes in for preop evaluation.  States that neck pain and left upper extremity radiculopathy unchanged from previous visit.  She is wanting to proceed with C6-7 ACDF is scheduled.  We received preop cardiac clearance.  Review of Systems No current cardiac pulmonary GI GU issues  Objective: Vital Signs: BP 125/77    Pulse 84    Ht 5\' 7"  (1.702 m)    Wt 165 lb (74.8 kg)    BMI 25.84 kg/m   Physical Exam HENT:     Head: Normocephalic and atraumatic.     Nose: Nose normal.  Eyes:     Extraocular Movements: Extraocular movements intact.  Cardiovascular:     Rate and Rhythm: Regular rhythm.     Heart sounds: Murmur heard.  Pulmonary:     Effort: Pulmonary effort is normal. No respiratory distress.     Breath sounds: Normal breath sounds.  Abdominal:     General: There is no distension.  Neurological:     Mental Status: She is alert and oriented to person, place, and time.   Psychiatric:        Mood and Affect: Mood normal.    Ortho Exam  Specialty Comments:  No specialty comments available.  Imaging: No results found.   PMFS History: Patient Active Problem List   Diagnosis Date Noted   Protrusion of cervical intervertebral disc 08/08/2021   Other spondylosis with radiculopathy, cervical region 07/24/2021   Preop cardiovascular exam 11/29/2020   Hyperlipidemia with target LDL less than 130 08/31/2020   Essential hypertension 08/30/2020   Cancer of alimentary canal/tract ([place]) 12/08/2019   Closed fracture of distal phalanx of finger 04/17/2019   Sprain of right wrist 04/17/2019   Sleep disturbance 03/03/2019   RAD (reactive airway disease) 02/26/2019   Tachycardia 02/26/2019   History of radiation therapy 02/26/2019   Increased risk of breast cancer 02/26/2019   Vitamin D deficiency 11/12/2018   Depression, major, in remission ([place]) 11/12/2018   Aphthous ulcer 05/13/2018   High risk medication use 05/13/2018   Sore throat 12/07/2017   Acute non-recurrent maxillary sinusitis 12/07/2017   Vaccine counseling 07/26/2017   Breast mass, left 09/08/2016   Routine general medical examination at a health care facility 05/30/2016   Other specified hypothyroidism 04/01/2015   Encounter for health maintenance examination  in adult 04/01/2015   Influenza vaccination declined 04/01/2015   Generalized anxiety disorder 04/01/2015   Asthma, mild intermittent 04/01/2015   Oral ulcer 04/01/2015   Biceps tendonitis on left 07/27/2014   Subacromial or subdeltoid bursitis 07/27/2014   Impingement syndrome of left shoulder 06/09/2014   Left shoulder pain 05/26/2014   Major depression, recurrent ([place]) 05/25/2013   Allergic rhinitis 01/20/2013   Recurrent infections 01/20/2013   Moderate mitral regurgitation by prior echocardiogram 12/28/2012   Palpitation 10/25/2012   Female infertility of other specified origin 04/09/2012   Raynauds disease 06/29/2011    History of nodular sclerosis Hodgkin's disease 06/29/2011   Hodgkin's disease in remission ([place]) 12/29/2010   MVP (mitral valve prolapse) 12/29/2010   Past Medical History:  Diagnosis Date   Anxiety    Asthma    mild intermittent as of 07/2017 - just when patient gets sick   Depression    Female infertility    GERD (gastroesophageal reflux disease)    Hx - no current problems, no meds   H/O amaurosis [place] Fall 2012   Resolved, no current problems, Hx-MRI of brain/brain stem: no acute abnormality, small area of encephalomalacia left superior cerebellum that could be prior trauma, infection, or lacunar infarct   History of Hodgkin's lymphoma 2007   in remission, sees [facility], prior with Dr. Elwanda [place]; prior chemotherapy and radiation therapy; sees yearly in December, no current problems   Hyperlipidemia 07/2017   diet control, no meds   Hypothyroidism    Moderate mitral regurgitation by prior echocardiogram 09/2010   10/2015: EF 55-60%. Normal WM. Normal D Fxn. Bilateral MVP w/ Mod MR (very eccentric). Normal LA, RV & RA. Normal PAP.   MVP (mitral valve prolapse) 09/2010   Dr. Ellyn Hack, [facility]; a) Echo 3/'12: Mod MR; EF 60-65%; b) TEE 5/'12: Nl LV Fxn, EF 60-65%, mild, holosystolic prolapse of the medial anterior leaflet. Mod MR . c) Echo 3/'15: Moderate, holosystolic prolapse of  medial. Mod MR d) TM Stress Echo (@ [place]) Nl Lv Fxn, Mild-Mod MR @ Res0 --> Mod MR at HR 181 bpm (7:22 min, 10.10 METS) w/p WMA   Oral ulcer    Palpitations 10/2012   holter monitor, Dr. Ellyn Hack; no arrhythmia, no current problems    Family History  Problem Relation Age of Onset   Skin cancer Father    Cancer Paternal Grandmother        metastasis   Alcohol abuse Mother    Emphysema Paternal Grandfather        was a smoker, heart problems   Diabetes Maternal Aunt    OCD Maternal Aunt    Bipolar disorder Maternal Aunt    Stroke Maternal Grandmother    Cancer Maternal Aunt        skin   Heart disease Neg  Hx    Hypertension Neg Hx    Hyperlipidemia Neg Hx     Past Surgical History:  Procedure Laterality Date   [place] [facility]GERY  2002   COLONOSCOPY  07/2014   Dr. Collene Mares - normal   DILATATION & CURETTAGE/HYSTEROSCOPY WITH MYOSURE N/A 01/29/2018   Procedure: DILATATION & CURETTAGE/HYSTEROSCOPY WITH MYOSURE POLYPECTOMY;  Surgeon: Charyl Bigger, MD;  Location: [facility];  Service: Gynecology;  Laterality: N/A;   ESOPHAGOGASTRODUODENOSCOPY  04/11/13   [facility] Dr. Collene Mares   FOOT SURGERY Bilateral    on toes   INSERTION CENTRAL VENOUS ACCESS DEVICE W/ SUBCUTANEOUS PORT  2006   LYMPH  NODE BIOPSY  2006   under right arm   NM MYOCAR PERF WALL MOTION  12/2010   persantine myoview - moderate breast attenuation (fixed mid-distal anterior defect), EF 68%, low risk scan   RHINOPLASTY  2007   deviated septum   TEE WITHOUT CARDIOVERSION  11/2010   Nl LV Fxn, EF 60-65%, mild, holosystolic prolapse of the medial anterior leaflet. Mod MR .    TRANSTHORACIC ECHOCARDIOGRAM  3/'14; 3/'15   LVEF 60-65%, wall motion normal, LV function normal, moderate holosystolic MVP (medial the middle scallop of the posterior leaflet), moderate regurgitation (directed posteriorly), no shunt -- stable over 2 years   TRANSTHORACIC ECHOCARDIOGRAM  10/2015   EF 55-60%. Normal WM. Normal D Fxn. Bilateral MVP w/ Mod MR (very eccentric). Normal LA, RV & RA. Normal PAP.   TRANSTHORACIC ECHOCARDIOGRAM  10/22/2019   EF 55 to 60%.  Mild LVH.  GRII DD.  Elevated LAP.  Mild LA dilation.  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  Normal aortic valve.  (Stable   WISDOM TOOTH EXTRACTION     Social History   Occupational History   Occupation: works in Engineer, production: [employer]  Tobacco Use   Smoking status: Former    Packs/day: 1.00    Years: 10.00    Pack years: 10.00    Types: Cigarettes    Quit date: 11/07/2009    Years since quitting: 11.8   Smokeless tobacco: Never  Vaping Use    Vaping Use: Never used  Substance and Sexual Activity   Alcohol use: Yes    Alcohol/week: 1.0 - 2.0 standard drink    Types: 1 - 2 Cans of beer per week    Comment: social Beer/Liquor   Drug use: No   Sexual activity: Yes    Partners: Male    Birth control/protection: None

## 2021-09-01 NOTE — Telephone Encounter (Signed)
Request for refill on pts. Xanax last apt was 03/29/21 and next apt is 11/01/21.

## 2021-09-04 ENCOUNTER — Other Ambulatory Visit: Payer: Self-pay | Admitting: Family Medicine

## 2021-09-04 DIAGNOSIS — E038 Other specified hypothyroidism: Secondary | ICD-10-CM

## 2021-09-05 NOTE — Pre-Procedure Instructions (Addendum)
Surgical Instructions    Your procedure is scheduled on Friday, February 24th.  Report to [facility] Main Entrance "A" at 5:30 A.M., then check in with the Admitting office.  Call this number if you have problems the morning of surgery:  [phone]   If you have any questions prior to your surgery date call [phone]: Open Monday-Friday 8am-4pm    Remember:  Do not eat after midnight the night before your surgery  You may drink clear liquids until 4:30 a.m. the morning of your surgery.   Clear liquids allowed are: Water, Non-Citrus Juices (without pulp), Carbonated Beverages, Clear Tea, Black Coffee Only (NO MILK, CREAM OR POWDERED CREAMER of any kind), and Gatorade.   Enhanced Recovery after Surgery for Orthopedics Enhanced Recovery after Surgery is a protocol used to improve the stress on your body and your recovery after surgery.  Patient Instructions  The day of surgery (if you do NOT have diabetes):  Drink ONE (1) Pre-Surgery Clear Ensure by 4:30 am the morning of surgery   This drink was given to you during your hospital  pre-op appointment visit. Nothing else to drink after completing the  Pre-Surgery Clear Ensure.         If you have questions, please contact your surgeons office.     Take these medicines the morning of surgery with A SIP OF WATER  SYNTHROID  ALPRAZolam Duanne Moron) -as needed  Make sure to take your bedtime dose of metoprolol succinate (TOPROL-XL) the night before surgery.  As of today, STOP taking any Aspirin (unless otherwise instructed by your surgeon) Aleve, Naproxen, Ibuprofen, Motrin, Advil, Goody's, BC's, all herbal medications, fish oil, and all vitamins.                     Do NOT Smoke (Tobacco/Vaping) for 24 hours prior to your procedure.  If you use a CPAP at night, you may bring your mask/headgear for your overnight stay.   Contacts, glasses, piercing's, hearing aid's, dentures or partials may not be worn into surgery, please bring  cases for these belongings.    For patients admitted to the hospital, discharge time will be determined by your treatment team.   Patients discharged the day of surgery will not be allowed to drive home, and someone needs to stay with them for 24 hours.  NO VISITORS WILL BE ALLOWED IN PRE-OP WHERE PATIENTS ARE PREPPED FOR SURGERY.  ONLY 1 SUPPORT PERSON MAY BE PRESENT IN THE WAITING ROOM WHILE YOU ARE IN SURGERY.  IF YOU ARE TO BE ADMITTED, ONCE YOU ARE IN YOUR ROOM YOU WILL BE ALLOWED TWO (2) VISITORS. (1) VISITOR MAY STAY OVERNIGHT BUT MUST ARRIVE TO THE ROOM BY 8pm.  Minor children may have two parents present. Special consideration for safety and communication needs will be reviewed on a case by case basis.   Special instructions:   [facility]- Preparing For Surgery  Before surgery, you can play an important role. Because skin is not sterile, your skin needs to be as free of germs as possible. You can reduce the number of germs on your skin by washing with CHG (chlorahexidine gluconate) Soap before surgery.  CHG is an antiseptic cleaner which kills germs and bonds with the skin to continue killing germs even after washing.    Oral Hygiene is also important to reduce your risk of infection.  Remember - BRUSH YOUR TEETH THE MORNING OF SURGERY WITH YOUR REGULAR TOOTHPASTE  Please do not use if you  have an allergy to CHG or antibacterial soaps. If your skin becomes reddened/irritated stop using the CHG.  Do not shave (including legs and underarms) for at least 48 hours prior to first CHG shower. It is OK to shave your face.  Please follow these instructions carefully.   Shower the NIGHT BEFORE SURGERY and the MORNING OF SURGERY  If you chose to wash your hair, wash your hair first as usual with your normal shampoo.  After you shampoo, rinse your hair and body thoroughly to remove the shampoo.  Use CHG Soap as you would any other liquid soap. You can apply CHG directly to the skin and wash  gently with a scrungie or a clean washcloth.   Apply the CHG Soap to your body ONLY FROM THE NECK DOWN.  Do not use on open wounds or open sores. Avoid contact with your eyes, ears, mouth and genitals (private parts). Wash Face and genitals (private parts)  with your normal soap.   Wash thoroughly, paying special attention to the area where your surgery will be performed.  Thoroughly rinse your body with warm water from the neck down.  DO NOT shower/wash with your normal soap after using and rinsing off the CHG Soap.  Pat yourself dry with a CLEAN TOWEL.  Wear CLEAN PAJAMAS to bed the night before surgery  Place CLEAN SHEETS on your bed the night before your surgery  DO NOT SLEEP WITH PETS.   Day of Surgery: Shower with CHG soap. Do not wear jewelry, make up, nail polish, gel polish, artificial nails, or any other type of covering on natural nails including finger and toenails. If patients have artificial nails, gel coating, etc. that need to be removed by a nail salon please have this removed prior to surgery. Surgery may need to be canceled/delayed if the surgeon/anesthesiologist feels like the patient is unable to be adequately monitored. Do not wear lotions, powders, perfumes, or deodorant. Do not shave 48 hours prior to surgery.   Do not bring valuables to the hospital. [facility] is not responsible for any belongings or valuables. Wear Clean/Comfortable clothing the morning of surgery Remember to brush your teeth WITH YOUR REGULAR TOOTHPASTE.   Please read over the following fact sheets that you were given.   3 days prior to your procedure or After your COVID test   You are not required to quarantine however you are required to wear a well-fitting mask when you are out and around people not in your household. If your mask becomes wet or soiled, replace with a new one.   Wash your hands often with soap and water for 20 seconds or clean your hands with an alcohol-based  hand sanitizer that contains at least 60% alcohol.   Do not share personal items.   Notify your provider:  o if you are in close contact with someone who has COVID  o or if you develop a fever of 100.4 or greater, sneezing, cough, sore throat, shortness of breath or body aches.

## 2021-09-06 ENCOUNTER — Encounter (HOSPITAL_COMMUNITY)
Admission: RE | Admit: 2021-09-06 | Discharge: 2021-09-06 | Disposition: A | Discharge status: Home / Self Care | Payer: BC Managed Care – PPO | Source: Ambulatory Visit | Attending: Orthopaedic Surgery | Admitting: Orthopaedic Surgery

## 2021-09-06 ENCOUNTER — Encounter (HOSPITAL_COMMUNITY): Payer: Self-pay

## 2021-09-06 ENCOUNTER — Other Ambulatory Visit: Payer: Self-pay

## 2021-09-06 VITALS — BP 124/86 | HR 98 | Temp 98.5°F | Resp 18 | Ht 67.0 in | Wt 161.4 lb

## 2021-09-06 DIAGNOSIS — I340 Nonrheumatic mitral (valve) insufficiency: Secondary | ICD-10-CM | POA: Insufficient documentation

## 2021-09-06 DIAGNOSIS — I10 Essential (primary) hypertension: Secondary | ICD-10-CM | POA: Insufficient documentation

## 2021-09-06 DIAGNOSIS — Z9221 Personal history of antineoplastic chemotherapy: Secondary | ICD-10-CM | POA: Diagnosis not present

## 2021-09-06 DIAGNOSIS — Z8571 Personal history of Hodgkin lymphoma: Secondary | ICD-10-CM | POA: Diagnosis not present

## 2021-09-06 DIAGNOSIS — I341 Nonrheumatic mitral (valve) prolapse: Secondary | ICD-10-CM | POA: Insufficient documentation

## 2021-09-06 DIAGNOSIS — R000 Tachycardia, unspecified: Secondary | ICD-10-CM | POA: Insufficient documentation

## 2021-09-06 DIAGNOSIS — Z923 Personal history of irradiation: Secondary | ICD-10-CM | POA: Diagnosis not present

## 2021-09-06 DIAGNOSIS — Z01812 Encounter for preprocedural laboratory examination: Secondary | ICD-10-CM | POA: Insufficient documentation

## 2021-09-06 DIAGNOSIS — Z20822 Contact with and (suspected) exposure to covid-19: Secondary | ICD-10-CM | POA: Insufficient documentation

## 2021-09-06 DIAGNOSIS — Z9013 Acquired absence of bilateral breasts and nipples: Secondary | ICD-10-CM | POA: Diagnosis not present

## 2021-09-06 DIAGNOSIS — Z853 Personal history of malignant neoplasm of breast: Secondary | ICD-10-CM | POA: Diagnosis not present

## 2021-09-06 DIAGNOSIS — Z01818 Encounter for other preprocedural examination: Secondary | ICD-10-CM

## 2021-09-06 HISTORY — DX: Nausea with vomiting, unspecified: R11.2

## 2021-09-06 HISTORY — DX: Other specified postprocedural states: Z98.890

## 2021-09-06 LAB — CBC
HCT: 37.3 % (ref 36.0–46.0)
Hemoglobin: 12.1 g/dL (ref 12.0–15.0)
MCH: 29.9 pg (ref 26.0–34.0)
MCHC: 32.4 g/dL (ref 30.0–36.0)
MCV: 92.1 fL (ref 80.0–100.0)
Platelets: 317 10*3/uL (ref 150–400)
RBC: 4.05 MIL/uL (ref 3.87–5.11)
RDW: 15.6 % — ABNORMAL HIGH (ref 11.5–15.5)
WBC: 4.9 10*3/uL (ref 4.0–10.5)
nRBC: 0 % (ref 0.0–0.2)

## 2021-09-06 LAB — SURGICAL PCR SCREEN
MRSA, PCR: NEGATIVE
Staphylococcus aureus: NEGATIVE

## 2021-09-06 LAB — COMPREHENSIVE METABOLIC PANEL
ALT: 14 U/L (ref 0–44)
AST: 23 U/L (ref 15–41)
Albumin: 4.2 g/dL (ref 3.5–5.0)
Alkaline Phosphatase: 61 U/L (ref 38–126)
Anion gap: 9 (ref 5–15)
BUN: 7 mg/dL (ref 6–20)
CO2: 26 mmol/L (ref 22–32)
Calcium: 9.2 mg/dL (ref 8.9–10.3)
Chloride: 104 mmol/L (ref 98–111)
Creatinine, Ser: 0.79 mg/dL (ref 0.44–1.00)
GFR, Estimated: >60 mL/min (ref >60)
Glucose, Bld: 93 mg/dL (ref 70–99)
Potassium: 4.2 mmol/L (ref 3.5–5.1)
Sodium: 139 mmol/L (ref 135–145)
Total Bilirubin: 0.5 mg/dL (ref 0.3–1.2)
Total Protein: 7.3 g/dL (ref 6.5–8.1)

## 2021-09-06 LAB — SARS CORONAVIRUS 2 (TAT 6-24 HRS): SARS Coronavirus 2: NEGATIVE

## 2021-09-06 NOTE — Progress Notes (Addendum)
PCP - Dr. Jill Alexanders Cardiologist - Dr. Ellyn Hack   PPM/ICD - n/a  Chest x-ray - n/a EKG - 05/31/21 Stress Test - 2015 ECHO - 10/22/19 Cardiac Cath -denies   Sleep Study - denies CPAP - denies  Blood Thinner Instructions: n/a Aspirin Instructions: n/a  ERAS Protcol -Clear liquids until 0430 DOS PRE-SURGERY Ensure or G2- (1) Ensure provided.  COVID TEST- 09/06/21; done in PAT  Anesthesia review: hx of Mitral valve prolapse. Cardiac clearance received 08/29/21  Patient denies shortness of breath, fever, cough and chest pain at PAT appointment   All instructions explained to the patient, with a verbal understanding of the material. Patient agrees to go over the instructions while at home for a better understanding. Patient also instructed to self quarantine after being tested for COVID-19. The opportunity to ask questions was provided.

## 2021-09-07 NOTE — Anesthesia Preprocedure Evaluation (Addendum)
Anesthesia Evaluation  Patient identified by MRN, date of birth, ID band Patient awake    Reviewed: Allergy & Precautions, H&P , NPO status , Patient's Chart, lab work & pertinent test results  History of Anesthesia Complications (+) PONV and history of anesthetic complications  Airway Mallampati: I  TM Distance: >3 FB Neck ROM: Full    Dental no notable dental hx. (+) Teeth Intact, Dental Advisory Given   Pulmonary neg pulmonary ROS, former smoker,    Pulmonary exam normal breath sounds clear to auscultation       Cardiovascular Exercise Tolerance: Good hypertension, Pt. on home beta blockers and Pt. on medications Normal cardiovascular exam Rhythm:Regular Rate:Normal     Neuro/Psych PSYCHIATRIC DISORDERS Anxiety Depression  Neuromuscular disease    GI/Hepatic Neg liver ROS, GERD  Medicated and Controlled,  Endo/Other  Hypothyroidism   Renal/GU negative Renal ROS  negative genitourinary   Musculoskeletal  (+) Arthritis , Osteoarthritis,    Abdominal   Peds negative pediatric ROS (+)  Hematology negative hematology ROS (+)   Anesthesia Other Findings   Reproductive/Obstetrics negative OB ROS                           Anesthesia Physical Anesthesia Plan  ASA: 3  Anesthesia Plan: General   Post-op Pain Management: Tylenol PO (pre-op)*, Dilaudid IV and Celebrex PO (pre-op)*   Induction: Intravenous  PONV Risk Score and Plan: 3 and Ondansetron, Dexamethasone and Treatment may vary due to age or medical condition  Airway Management Planned: Oral ETT and Video Laryngoscope Planned  Additional Equipment: None  Intra-op Plan:   Post-operative Plan: Extubation in OR  Informed Consent: I have reviewed the patients History and Physical, chart, labs and discussed the procedure including the risks, benefits and alternatives for the proposed anesthesia with the patient or authorized  representative who has indicated his/her understanding and acceptance.       Plan Discussed with: Anesthesiologist and CRNA  Anesthesia Plan Comments: (PAT note by Karoline Caldwell, PA-C: Follows with cardiology for history of HTN, sinus tachycardia, MVP, moderate MR.  Cardiac clearance per telephone encounter 08/31/2021, "Chart reviewed as part of pre-operative protocol coverage.Patient returned call.The patient affirmsshehas been doing well without any new cardiac symptoms. Able to achieve over 4 METS without angina or dyspnea. Primary limiting factor is neck issues for which surgery is planned, no cardiac symptoms with physical exertion.Therefore, based on ACC/AHA guidelines, the patient would be at acceptable risk for the planned procedure without further cardiovascular testing. The patient was advised that ifshedevelops new symptoms prior to surgery to contact our office to arrange for a follow-up visit, and sheverbalized understanding."  Follows with oncology at [facility] for history of Hodgkin's Lymphoma treated with chemotherapy and XRT), breast cancer (s/pprophylactic bilateral mastectomy 2021 with multiple iterations of reconstruction surgery).  Preop labs reviewed, unremarkable.  EKG 05/30/2021: NSR.  Rate 89.  TTE 10/22/2019: 1. Left ventricular ejection fraction, by estimation, is 55 to 60%. The  left ventricle has normal function. The left ventricle has no regional  wall motion abnormalities. There is mild left ventricular hypertrophy.  Left ventricular diastolic parameters  are consistent with Grade II diastolic dysfunction (pseudonormalization).  Elevated left atrial pressure.  2. Right ventricular systolic function is normal. The right ventricular  size is normal.  3. Left atrial size was mildly dilated.  4. The mitral valve is myxomatous. Moderate mitral valve regurgitation.  No evidence of mitral stenosis.  5. The aortic  valve is normal in structure. Aortic valve  regurgitation is  not visualized. No aortic stenosis is present.  6. The inferior vena cava is normal in size with greater than 50%  respiratory variability, suggesting right atrial pressure of 3 mmHg.  Event monitor 03/20/2019:  14-day ZIO patch monitor  Sinus rhythm with very rare PACs and PVCs.  Minimum heart rate 70 bpm. Maximum heart rate 171 bpm with average heart rate 96 bpm.  No tachycardia or bradycardia arrhythmias noted.  Pretty normal monitor. Symptoms noted with heart rates greater than 90 bpm.  With these findings, we can consider increasing the diltiazem dose to lower resting heart rate. )      Anesthesia Quick Evaluation

## 2021-09-07 NOTE — Progress Notes (Signed)
Anesthesia Chart Review:  Follows with cardiology for history of HTN, sinus tachycardia, MVP, moderate MR.  Cardiac clearance per telephone encounter 08/31/2021, "Chart reviewed as part of pre-operative protocol coverage. Patient returned call. The patient affirms she has been doing well without any new cardiac symptoms. Able to achieve over 4 METS without angina or dyspnea. Primary limiting factor is neck issues for which surgery is planned, no cardiac symptoms with physical exertion. Therefore, based on ACC/AHA guidelines, the patient would be at acceptable risk for the planned procedure without further cardiovascular testing. The patient was advised that if she develops new symptoms prior to surgery to contact our office to arrange for a follow-up visit, and she verbalized understanding."  Follows with oncology at [facility] for history of Hodgkin's Lymphoma treated with chemotherapy and XRT), breast cancer (s/p prophylactic bilateral mastectomy 2021 with multiple iterations of reconstruction surgery).  Preop labs reviewed, unremarkable.  EKG 05/30/2021: NSR.  Rate 89.  TTE 10/22/2019:  1. Left ventricular ejection fraction, by estimation, is 55 to 60%. The  left ventricle has normal function. The left ventricle has no regional  wall motion abnormalities. There is mild left ventricular hypertrophy.  Left ventricular diastolic parameters  are consistent with Grade II diastolic dysfunction (pseudonormalization).  Elevated left atrial pressure.   2. Right ventricular systolic function is normal. The right ventricular  size is normal.   3. Left atrial size was mildly dilated.   4. The mitral valve is myxomatous. Moderate mitral valve regurgitation.  No evidence of mitral stenosis.   5. The aortic valve is normal in structure. Aortic valve regurgitation is  not visualized. No aortic stenosis is present.   6. The inferior vena cava is normal in size with greater than 50%  respiratory variability,  suggesting right atrial pressure of 3 mmHg.  Event monitor 03/20/2019: 14-day ZIO patch monitor Sinus rhythm with very rare PACs and PVCs. Minimum heart rate 70 bpm. Maximum heart rate 171 bpm with average heart rate 96 bpm. No tachycardia or bradycardia arrhythmias noted.   Pretty normal monitor.  Symptoms noted with heart rates greater than 90 bpm.   With these findings, we can consider increasing the diltiazem dose to lower resting heart rate.    Wynonia Musty [facility] Short Stay Center/Anesthesiology Phone [phone] 09/07/2021 12:52 PM

## 2021-09-08 NOTE — H&P (Signed)
Office Visit Note              Patient: Lisa Waters                                 Date of Birth: [birth year]                                                    MRN: [id number] Visit Date: 09/01/2021                                                                     Requested by: Denita Lung, MD [place],  [place] [id number] PCP: Denita Lung, MD     Assessment & Plan: Visit Diagnoses:  1. Radiculopathy, cervical region  C6-7 HNP/stenosis   Plan: We will proceed with C6-7 ACDF as scheduled.  Surgical procedure along with potential rehab/recovery time discussed with patient and husband in great detail.  All questions answered.   Follow-Up Instructions: Return for needs return office visit with dr Lorin Mercy one week postop.    Orders:  No orders of the defined types were placed in this encounter.   No orders of the defined types were placed in this encounter.        Procedures: No procedures performed     Clinical Data: No additional findings.     Subjective:     Chief Complaint  Patient presents with   Neck - Pain      H&P 42/24/2023 C6-7 ACDF-[place]      HPI 41 year old white femaleemale history of C6-7 HNP/stenosis comes in for preop evaluation.  States that neck pain and left upper extremity radiculopathy unchanged from previous visit.  She is wanting to proceed with C6-7 ACDF is scheduled.  We received preop cardiac clearance.   Review of  Systems No current cardiac pulmonary GI GU issues   Objective: Vital Signs: BP 125/77    Pulse 84    Ht 5\' 7"  (1.702 m)    Wt 165 lb (74.8 kg)    BMI 25.84 kg/m    Physical Exam HENT:     Head: Normocephalic and atraumatic.     Nose: Nose normal.  Eyes:     Extraocular Movements: Extraocular movements intact.  Cardiovascular:     Rate and Rhythm: Regular rhythm.     Heart sounds: Murmur heard.  Pulmonary:     Effort: Pulmonary effort is normal. No respiratory distress.     Breath sounds: Normal breath sounds.  Abdominal:     General: There is no distension.  Neurological:     Mental Status: She is alert and oriented to person, place, and time.  Psychiatric:        Mood and Affect: Mood normal.      Ortho Exam   Specialty Comments:  No specialty comments available.   Imaging: No results found.     PMFS History:     Patient Active Problem List    Diagnosis Date Noted   Protrusion of cervical intervertebral disc 08/08/2021   Other spondylosis with radiculopathy, cervical region 07/24/2021   Preop cardiovascular exam 11/29/2020   Hyperlipidemia with target LDL less than 130 08/31/2020   Essential hypertension 08/30/2020   Cancer of alimentary canal/tract ([place]) 12/08/2019   Closed fracture of distal phalanx of finger 04/17/2019   Sprain of right wrist 04/17/2019   Sleep disturbance 03/03/2019   RAD (reactive airway disease) 02/26/2019   Tachycardia 02/26/2019   History of radiation therapy 02/26/2019   Increased risk of breast cancer 02/26/2019   Vitamin D deficiency 11/12/2018   Depression, major, in remission ([place]) 11/12/2018   Aphthous ulcer 05/13/2018   High risk medication use 05/13/2018   Sore throat 12/07/2017   Acute non-recurrent maxillary sinusitis 12/07/2017   Vaccine counseling 07/26/2017   Breast mass, left 09/08/2016   Routine general medical examination at a health care facility 05/30/2016   Other specified hypothyroidism 04/01/2015   Encounter  for health maintenance examination in adult 04/01/2015   Influenza vaccination declined 04/01/2015   Generalized anxiety disorder 04/01/2015   Asthma, mild intermittent 04/01/2015   Oral ulcer 04/01/2015   Biceps tendonitis on left 07/27/2014   Subacromial or subdeltoid bursitis 07/27/2014   Impingement syndrome of left shoulder 06/09/2014  Left shoulder pain 05/26/2014   Major depression, recurrent ([place]) 05/25/2013   Allergic rhinitis 01/20/2013   Recurrent infections 01/20/2013   Moderate mitral regurgitation by prior echocardiogram 12/28/2012   Palpitation 10/25/2012   Female infertility of other specified origin 04/09/2012   Raynauds disease 06/29/2011   History of nodular sclerosis Hodgkin's disease 06/29/2011   Hodgkin's disease in remission ([place]) 12/29/2010   MVP (mitral valve prolapse) 12/29/2010        Past Medical History:  Diagnosis Date   Anxiety     Asthma      mild intermittent as of 07/2017 - just when patient gets sick   Depression     Female infertility     GERD (gastroesophageal reflux disease)      Hx - no current problems, no meds   H/O amaurosis [place] Fall 2012    Resolved, no current problems, Hx-MRI of brain/brain stem: no acute abnormality, small area of encephalomalacia left superior cerebellum that could be prior trauma, infection, or lacunar infarct   History of Hodgkin's lymphoma 2007    in remission, sees [facility], prior with Dr. Elwanda [place]; prior chemotherapy and radiation therapy; sees yearly in December, no current problems   Hyperlipidemia 07/2017    diet control, no meds   Hypothyroidism     Moderate mitral regurgitation by prior echocardiogram 09/2010    10/2015: EF 55-60%. Normal WM. Normal D Fxn. Bilateral MVP w/ Mod MR (very eccentric). Normal LA, RV & RA. Normal PAP.   MVP (mitral valve prolapse) 09/2010    Dr. Ellyn Hack, [facility]; a) Echo 3/'12: Mod MR; EF 60-65%; b) TEE 5/'12: Nl LV Fxn, EF 60-65%, mild, holosystolic prolapse of the medial  anterior leaflet. Mod MR . c) Echo 3/'15: Moderate, holosystolic prolapse of  medial. Mod MR d) TM Stress Echo (@ [place]) Nl Lv Fxn, Mild-Mod MR @ Res0 --> Mod MR at HR 181 bpm (7:22 min, 10.10 METS) w/p WMA   Oral ulcer     Palpitations 10/2012    holter monitor, Dr. Ellyn Hack; no arrhythmia, no current problems         Family History  Problem Relation Age of Onset   Skin cancer Father     Cancer Paternal Grandmother          metastasis   Alcohol abuse Mother     Emphysema Paternal Grandfather          was a smoker, heart problems   Diabetes Maternal Aunt     OCD Maternal Aunt     Bipolar disorder Maternal Aunt     Stroke Maternal Grandmother     Cancer Maternal Aunt          skin   Heart disease Neg Hx     Hypertension Neg Hx     Hyperlipidemia Neg Hx           Past Surgical History:  Procedure Laterality Date   [place] SURGERY   2002   COLONOSCOPY   07/2014    Dr. Collene Mares - normal   DILATATION & CURETTAGE/HYSTEROSCOPY WITH MYOSURE N/A 01/29/2018    Procedure: DILATATION & CURETTAGE/HYSTEROSCOPY WITH MYOSURE POLYPECTOMY;  Surgeon: Charyl Bigger, MD;  Location: [facility];  Service: Gynecology;  Laterality: N/A;   ESOPHAGOGASTRODUODENOSCOPY   04/11/13    [facility] Dr. Collene Mares   FOOT SURGERY Bilateral      on toes   INSERTION CENTRAL VENOUS ACCESS DEVICE W/ SUBCUTANEOUS PORT  2006   LYMPH NODE BIOPSY   2006    under right arm   NM MYOCAR PERF WALL MOTION   12/2010    persantine myoview - moderate breast attenuation (fixed mid-distal anterior defect), EF 68%, low risk scan   RHINOPLASTY   2007    deviated septum   TEE WITHOUT CARDIOVERSION   11/2010    Nl LV Fxn, EF 60-65%, mild, holosystolic prolapse of the medial anterior leaflet. Mod MR .    TRANSTHORACIC ECHOCARDIOGRAM   3/'14; 3/'15    LVEF 60-65%, wall motion normal, LV function normal, moderate holosystolic MVP (medial the middle scallop of the posterior leaflet), moderate  regurgitation (directed posteriorly), no shunt -- stable over 2 years   TRANSTHORACIC ECHOCARDIOGRAM   10/2015    EF 55-60%. Normal WM. Normal D Fxn. Bilateral MVP w/ Mod MR (very eccentric). Normal LA, RV & RA. Normal PAP.   TRANSTHORACIC ECHOCARDIOGRAM   10/22/2019    EF 55 to 60%.  Mild LVH.  GRII DD.  Elevated LAP.  Mild LA dilation.  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  Normal aortic valve.  (Stable   WISDOM TOOTH EXTRACTION        Social History         Occupational History   Occupation: works in Fish farm manager: [employer]  Tobacco Use   Smoking status: Former      Packs/day: 1.00      Years: 10.00      Pack years: 10.00      Types: Cigarettes      Quit date: 11/07/2009      Years since quitting: 11.8   Smokeless tobacco: Never  Vaping Use   Vaping Use: Never used  Substance and Sexual Activity   Alcohol use: Yes      Alcohol/week: 1.0 - 2.0 standard drink      Types: 1 - 2 Cans of beer per week      Comment: social Beer/Liquor   Drug use: No   Sexual activity: Yes      Partners: Male      Birth control/protection: None

## 2021-09-09 ENCOUNTER — Ambulatory Visit (HOSPITAL_COMMUNITY): Payer: BC Managed Care – PPO | Admitting: Physician Assistant

## 2021-09-09 ENCOUNTER — Ambulatory Visit (HOSPITAL_COMMUNITY): Payer: BC Managed Care – PPO

## 2021-09-09 ENCOUNTER — Other Ambulatory Visit: Payer: Self-pay

## 2021-09-09 ENCOUNTER — Ambulatory Visit (HOSPITAL_COMMUNITY): Payer: BC Managed Care – PPO | Admitting: Anesthesiology

## 2021-09-09 ENCOUNTER — Encounter (HOSPITAL_COMMUNITY): Payer: Self-pay | Admitting: Orthopaedic Surgery

## 2021-09-09 ENCOUNTER — Observation Stay (HOSPITAL_COMMUNITY)
Admission: RE | Admit: 2021-09-09 | Discharge: 2021-09-10 | Disposition: A | Discharge status: Home / Self Care | Payer: BC Managed Care – PPO | Attending: Orthopaedic Surgery | Admitting: Orthopaedic Surgery

## 2021-09-09 ENCOUNTER — Encounter (HOSPITAL_COMMUNITY)
Admission: RE | Disposition: A | Discharge status: Home / Self Care | Payer: Self-pay | Source: Home / Self Care | Attending: Orthopaedic Surgery

## 2021-09-09 DIAGNOSIS — J45909 Unspecified asthma, uncomplicated: Secondary | ICD-10-CM | POA: Diagnosis not present

## 2021-09-09 DIAGNOSIS — Z8509 Personal history of malignant neoplasm of other digestive organs: Secondary | ICD-10-CM | POA: Diagnosis not present

## 2021-09-09 DIAGNOSIS — M5020 Other cervical disc displacement, unspecified cervical region: Secondary | ICD-10-CM | POA: Diagnosis not present

## 2021-09-09 DIAGNOSIS — F418 Other specified anxiety disorders: Secondary | ICD-10-CM | POA: Diagnosis not present

## 2021-09-09 DIAGNOSIS — Z87891 Personal history of nicotine dependence: Secondary | ICD-10-CM | POA: Insufficient documentation

## 2021-09-09 DIAGNOSIS — Z01818 Encounter for other preprocedural examination: Secondary | ICD-10-CM

## 2021-09-09 DIAGNOSIS — Z981 Arthrodesis status: Secondary | ICD-10-CM | POA: Diagnosis not present

## 2021-09-09 DIAGNOSIS — M50223 Other cervical disc displacement at C6-C7 level: Secondary | ICD-10-CM | POA: Diagnosis not present

## 2021-09-09 DIAGNOSIS — I10 Essential (primary) hypertension: Secondary | ICD-10-CM | POA: Diagnosis not present

## 2021-09-09 DIAGNOSIS — M4802 Spinal stenosis, cervical region: Secondary | ICD-10-CM | POA: Diagnosis not present

## 2021-09-09 DIAGNOSIS — E039 Hypothyroidism, unspecified: Secondary | ICD-10-CM | POA: Diagnosis not present

## 2021-09-09 DIAGNOSIS — Z419 Encounter for procedure for purposes other than remedying health state, unspecified: Secondary | ICD-10-CM

## 2021-09-09 HISTORY — PX: ANTERIOR CERVICAL DECOMP/DISCECTOMY FUSION: SHX1161

## 2021-09-09 LAB — POCT PREGNANCY, URINE: Preg Test, Ur: NEGATIVE

## 2021-09-09 SURGERY — ANTERIOR CERVICAL DECOMPRESSION/DISCECTOMY FUSION 1 LEVEL
Anesthesia: General

## 2021-09-09 MED ORDER — LIDOCAINE 2% (20 MG/ML) 5 ML SYRINGE
INTRAMUSCULAR | Status: AC
  Filled 2021-09-09: qty 5

## 2021-09-09 MED ORDER — LIDOCAINE 2% (20 MG/ML) 5 ML SYRINGE
INTRAMUSCULAR | Status: DC | PRN
  Administered 2021-09-09: 80 mg via INTRAVENOUS

## 2021-09-09 MED ORDER — SODIUM CHLORIDE 0.9 % IV SOLN: INTRAVENOUS | Status: DC

## 2021-09-09 MED ORDER — MEPERIDINE HCL 25 MG/ML IJ SOLN: 6.2500 mg | INTRAMUSCULAR | Status: DC | PRN

## 2021-09-09 MED ORDER — PHENYLEPHRINE 40 MCG/ML (10ML) SYRINGE FOR IV PUSH (FOR BLOOD PRESSURE SUPPORT)
PREFILLED_SYRINGE | INTRAVENOUS | Status: DC | PRN
  Administered 2021-09-09: 80 ug via INTRAVENOUS
  Administered 2021-09-09 (×2): 120 ug via INTRAVENOUS
  Administered 2021-09-09: 80 ug via INTRAVENOUS

## 2021-09-09 MED ORDER — ORAL CARE MOUTH RINSE: 15.0000 mL | Freq: Once | OROMUCOSAL | Status: AC

## 2021-09-09 MED ORDER — FENTANYL CITRATE (PF) 250 MCG/5ML IJ SOLN
INTRAMUSCULAR | Status: AC
  Filled 2021-09-09: qty 5

## 2021-09-09 MED ORDER — FENTANYL CITRATE (PF) 100 MCG/2ML IJ SOLN
25.0000 ug | INTRAMUSCULAR | Status: DC | PRN
  Administered 2021-09-09 (×4): 25 ug via INTRAVENOUS

## 2021-09-09 MED ORDER — SODIUM CHLORIDE 0.9% FLUSH: 3.0000 mL | INTRAVENOUS | Status: DC | PRN

## 2021-09-09 MED ORDER — HYDROMORPHONE HCL 1 MG/ML IJ SOLN
INTRAMUSCULAR | Status: AC
  Filled 2021-09-09: qty 0.5

## 2021-09-09 MED ORDER — OXYCODONE HCL 5 MG/5ML PO SOLN: 5.0000 mg | Freq: Once | ORAL | Status: DC | PRN

## 2021-09-09 MED ORDER — CELECOXIB 200 MG PO CAPS
200.0000 mg | ORAL_CAPSULE | Freq: Once | ORAL | Status: AC
  Administered 2021-09-09: 200 mg via ORAL
  Filled 2021-09-09: qty 1

## 2021-09-09 MED ORDER — 0.9 % SODIUM CHLORIDE (POUR BTL) OPTIME
TOPICAL | Status: DC | PRN
  Administered 2021-09-09: 1000 mL

## 2021-09-09 MED ORDER — FENTANYL CITRATE (PF) 250 MCG/5ML IJ SOLN
INTRAMUSCULAR | Status: DC | PRN
  Administered 2021-09-09 (×2): 100 ug via INTRAVENOUS
  Administered 2021-09-09: 50 ug via INTRAVENOUS

## 2021-09-09 MED ORDER — ONDANSETRON HCL 4 MG/2ML IJ SOLN: 4.0000 mg | Freq: Once | INTRAMUSCULAR | Status: DC | PRN

## 2021-09-09 MED ORDER — LACTATED RINGERS IV SOLN: INTRAVENOUS | Status: DC

## 2021-09-09 MED ORDER — SUGAMMADEX SODIUM 200 MG/2ML IV SOLN
INTRAVENOUS | Status: DC | PRN
  Administered 2021-09-09: 200 mg via INTRAVENOUS

## 2021-09-09 MED ORDER — CHLORHEXIDINE GLUCONATE 0.12 % MT SOLN: 15.0000 mL | Freq: Once | OROMUCOSAL | Status: AC

## 2021-09-09 MED ORDER — ONDANSETRON HCL 4 MG/2ML IJ SOLN
INTRAMUSCULAR | Status: DC | PRN
  Administered 2021-09-09: 4 mg via INTRAVENOUS

## 2021-09-09 MED ORDER — ACETAMINOPHEN 500 MG PO TABS
1000.0000 mg | ORAL_TABLET | Freq: Once | ORAL | Status: AC
  Administered 2021-09-09: 1000 mg via ORAL
  Filled 2021-09-09: qty 2

## 2021-09-09 MED ORDER — PROPOFOL 10 MG/ML IV BOLUS
INTRAVENOUS | Status: AC
  Filled 2021-09-09: qty 20

## 2021-09-09 MED ORDER — MIDAZOLAM HCL 2 MG/2ML IJ SOLN
INTRAMUSCULAR | Status: DC | PRN
Start: 2021-09-09 — End: 2021-09-09
  Administered 2021-09-09: 2 mg via INTRAVENOUS

## 2021-09-09 MED ORDER — SODIUM CHLORIDE 0.9% FLUSH
3.0000 mL | Freq: Two times a day (BID) | INTRAVENOUS | Status: DC
  Administered 2021-09-09 (×2): 3 mL via INTRAVENOUS

## 2021-09-09 MED ORDER — METOPROLOL SUCCINATE ER 50 MG PO TB24
50.0000 mg | ORAL_TABLET | Freq: Every day | ORAL | Status: DC
  Administered 2021-09-09: 50 mg via ORAL
  Filled 2021-09-09: qty 1

## 2021-09-09 MED ORDER — MENTHOL 3 MG MT LOZG
1.0000 | LOZENGE | OROMUCOSAL | Status: DC | PRN
  Filled 2021-09-09: qty 9

## 2021-09-09 MED ORDER — ACETAMINOPHEN 325 MG PO TABS
650.0000 mg | ORAL_TABLET | ORAL | Status: DC | PRN
Start: 2021-09-09 — End: 2021-09-10
  Administered 2021-09-09 – 2021-09-10 (×3): 650 mg via ORAL
  Filled 2021-09-09 (×3): qty 2

## 2021-09-09 MED ORDER — LACTATED RINGERS IV SOLN: INTRAVENOUS | Status: DC | PRN

## 2021-09-09 MED ORDER — ONDANSETRON HCL 4 MG PO TABS: 4.0000 mg | ORAL_TABLET | Freq: Four times a day (QID) | ORAL | Status: DC | PRN

## 2021-09-09 MED ORDER — SERTRALINE HCL 100 MG PO TABS
100.0000 mg | ORAL_TABLET | Freq: Every day | ORAL | Status: DC
  Administered 2021-09-09: 100 mg via ORAL
  Filled 2021-09-09: qty 1

## 2021-09-09 MED ORDER — ROCURONIUM BROMIDE 10 MG/ML (PF) SYRINGE
PREFILLED_SYRINGE | INTRAVENOUS | Status: DC | PRN
  Administered 2021-09-09: 20 mg via INTRAVENOUS
  Administered 2021-09-09: 60 mg via INTRAVENOUS
  Administered 2021-09-09: 20 mg via INTRAVENOUS

## 2021-09-09 MED ORDER — DIPHENHYDRAMINE HCL 50 MG/ML IJ SOLN
INTRAMUSCULAR | Status: DC | PRN
  Administered 2021-09-09: 25 mg via INTRAVENOUS

## 2021-09-09 MED ORDER — OXYCODONE HCL 5 MG PO TABS
5.0000 mg | ORAL_TABLET | ORAL | Status: DC | PRN
  Administered 2021-09-09 – 2021-09-10 (×6): 5 mg via ORAL
  Filled 2021-09-09 (×6): qty 1

## 2021-09-09 MED ORDER — PROPOFOL 10 MG/ML IV BOLUS
INTRAVENOUS | Status: DC | PRN
  Administered 2021-09-09: 20 mg via INTRAVENOUS
  Administered 2021-09-09: 40 mg via INTRAVENOUS
  Administered 2021-09-09: 160 mg via INTRAVENOUS

## 2021-09-09 MED ORDER — POLYETHYLENE GLYCOL 3350 17 G PO PACK: 17.0000 g | PACK | Freq: Every day | ORAL | Status: DC

## 2021-09-09 MED ORDER — ACETAMINOPHEN 325 MG PO TABS: 325.0000 mg | ORAL_TABLET | ORAL | Status: DC | PRN

## 2021-09-09 MED ORDER — OXYCODONE HCL 5 MG PO TABS: 5.0000 mg | ORAL_TABLET | Freq: Once | ORAL | Status: DC | PRN

## 2021-09-09 MED ORDER — ONDANSETRON HCL 4 MG/2ML IJ SOLN
INTRAMUSCULAR | Status: AC
  Filled 2021-09-09: qty 2

## 2021-09-09 MED ORDER — FENTANYL CITRATE (PF) 100 MCG/2ML IJ SOLN
INTRAMUSCULAR | Status: AC
  Filled 2021-09-09: qty 2

## 2021-09-09 MED ORDER — LEVOTHYROXINE SODIUM 88 MCG PO TABS
88.0000 ug | ORAL_TABLET | Freq: Every day | ORAL | Status: DC
  Administered 2021-09-10: 88 ug via ORAL
  Filled 2021-09-09: qty 1

## 2021-09-09 MED ORDER — CHLORHEXIDINE GLUCONATE 0.12 % MT SOLN
OROMUCOSAL | Status: AC
Start: 2021-09-09 — End: 2021-09-09
  Administered 2021-09-09: 15 mL via OROMUCOSAL
  Filled 2021-09-09: qty 15

## 2021-09-09 MED ORDER — PHENOL 1.4 % MT LIQD: 1.0000 | OROMUCOSAL | Status: DC | PRN

## 2021-09-09 MED ORDER — DIPHENHYDRAMINE HCL 50 MG/ML IJ SOLN
INTRAMUSCULAR | Status: AC
  Filled 2021-09-09: qty 1

## 2021-09-09 MED ORDER — BUPIVACAINE-EPINEPHRINE 0.5% -1:200000 IJ SOLN
INTRAMUSCULAR | Status: DC | PRN
  Administered 2021-09-09: 6 mL

## 2021-09-09 MED ORDER — METHOCARBAMOL 1000 MG/10ML IJ SOLN
500.0000 mg | Freq: Four times a day (QID) | INTRAVENOUS | Status: DC | PRN
  Filled 2021-09-09: qty 5

## 2021-09-09 MED ORDER — ALPRAZOLAM 0.5 MG PO TABS: 0.2500 mg | ORAL_TABLET | Freq: Two times a day (BID) | ORAL | Status: DC | PRN

## 2021-09-09 MED ORDER — HYDROMORPHONE HCL 1 MG/ML IJ SOLN
0.5000 mg | INTRAMUSCULAR | Status: DC | PRN
  Administered 2021-09-09 – 2021-09-10 (×2): 0.5 mg via INTRAVENOUS
  Filled 2021-09-09 (×2): qty 0.5

## 2021-09-09 MED ORDER — ACETAMINOPHEN 160 MG/5ML PO SOLN: 325.0000 mg | ORAL | Status: DC | PRN

## 2021-09-09 MED ORDER — DEXAMETHASONE SODIUM PHOSPHATE 10 MG/ML IJ SOLN
INTRAMUSCULAR | Status: AC
  Filled 2021-09-09: qty 1

## 2021-09-09 MED ORDER — HYDROMORPHONE HCL 1 MG/ML IJ SOLN
INTRAMUSCULAR | Status: DC | PRN
  Administered 2021-09-09: .5 mg via INTRAVENOUS

## 2021-09-09 MED ORDER — SODIUM CHLORIDE 0.9 % IV SOLN
250.0000 mL | INTRAVENOUS | Status: DC
  Administered 2021-09-09: 250 mL via INTRAVENOUS

## 2021-09-09 MED ORDER — BUPIVACAINE-EPINEPHRINE 0.5% -1:200000 IJ SOLN
INTRAMUSCULAR | Status: AC
  Filled 2021-09-09: qty 1

## 2021-09-09 MED ORDER — METHOCARBAMOL 500 MG PO TABS
500.0000 mg | ORAL_TABLET | Freq: Four times a day (QID) | ORAL | Status: DC | PRN
  Administered 2021-09-09 – 2021-09-10 (×3): 500 mg via ORAL
  Filled 2021-09-09 (×3): qty 1

## 2021-09-09 MED ORDER — DOCUSATE SODIUM 100 MG PO CAPS
100.0000 mg | ORAL_CAPSULE | Freq: Two times a day (BID) | ORAL | Status: DC
  Administered 2021-09-09 – 2021-09-10 (×2): 100 mg via ORAL
  Filled 2021-09-09 (×2): qty 1

## 2021-09-09 MED ORDER — DEXAMETHASONE SODIUM PHOSPHATE 10 MG/ML IJ SOLN
INTRAMUSCULAR | Status: DC | PRN
  Administered 2021-09-09: 4 mg via INTRAVENOUS

## 2021-09-09 MED ORDER — ROCURONIUM BROMIDE 10 MG/ML (PF) SYRINGE
PREFILLED_SYRINGE | INTRAVENOUS | Status: AC
  Filled 2021-09-09: qty 10

## 2021-09-09 MED ORDER — ONDANSETRON HCL 4 MG/2ML IJ SOLN: 4.0000 mg | Freq: Four times a day (QID) | INTRAMUSCULAR | Status: DC | PRN

## 2021-09-09 MED ORDER — PHENYLEPHRINE 40 MCG/ML (10ML) SYRINGE FOR IV PUSH (FOR BLOOD PRESSURE SUPPORT)
PREFILLED_SYRINGE | INTRAVENOUS | Status: AC
  Filled 2021-09-09: qty 10

## 2021-09-09 MED ORDER — ACETAMINOPHEN 650 MG RE SUPP: 650.0000 mg | RECTAL | Status: DC | PRN

## 2021-09-09 MED ORDER — MIDAZOLAM HCL 2 MG/2ML IJ SOLN
INTRAMUSCULAR | Status: AC
  Filled 2021-09-09: qty 2

## 2021-09-09 MED ORDER — CEFAZOLIN SODIUM-DEXTROSE 2-4 GM/100ML-% IV SOLN
2.0000 g | INTRAVENOUS | Status: AC
  Administered 2021-09-09: 2 g via INTRAVENOUS
  Filled 2021-09-09: qty 100

## 2021-09-09 SURGICAL SUPPLY — 51 items
AGENT HMST KT MTR STRL THRMB (HEMOSTASIS)
APL SKNCLS STERI-STRIP NONHPOA (GAUZE/BANDAGES/DRESSINGS) ×1
BAG COUNTER SPONGE SURGICOUNT (BAG) ×2 IMPLANT
BENZOIN TINCTURE PRP APPL 2/3 (GAUZE/BANDAGES/DRESSINGS) ×2 IMPLANT
BIT DRILL SMALL W/STOP 14 (BIT) ×1 IMPLANT
BLADE CLIPPER SURG (BLADE) IMPLANT
BONE CC-ACS 11X14 X8 6D (Bone Implant) ×2 IMPLANT
BUR ROUND FLUTED 4 SOFT TCH (BURR) ×2 IMPLANT
CHIPS BONE CANC-ACS 11X14X8 6D (Bone Implant) IMPLANT
COLLAR CERV LO CONTOUR FIRM DE (SOFTGOODS) ×2 IMPLANT
COVER MAYO STAND STRL (DRAPES) ×2 IMPLANT
COVER SURGICAL LIGHT HANDLE (MISCELLANEOUS) ×2 IMPLANT
DRAPE C-ARM 42X72 X-RAY (DRAPES) ×2 IMPLANT
DRAPE HALF SHEET 40X57 (DRAPES) ×2 IMPLANT
DRAPE MICROSCOPE LEICA (MISCELLANEOUS) ×2 IMPLANT
DURAPREP 6ML APPLICATOR 50/CS (WOUND CARE) ×2 IMPLANT
ELECT COATED BLADE 2.86 ST (ELECTRODE) ×2 IMPLANT
ELECT REM PT RETURN 9FT ADLT (ELECTROSURGICAL) ×2
ELECTRODE REM PT RTRN 9FT ADLT (ELECTROSURGICAL) ×1 IMPLANT
EVACUATOR 1/8 PVC DRAIN (DRAIN) ×2 IMPLANT
GAUZE SPONGE 4X4 12PLY STRL (GAUZE/BANDAGES/DRESSINGS) ×2 IMPLANT
GLOVE SRG 8 PF TXTR STRL LF DI (GLOVE) ×2 IMPLANT
GLOVE SURG ORTHO LTX SZ7.5 (GLOVE) ×4 IMPLANT
GLOVE SURG UNDER POLY LF SZ8 (GLOVE) ×4
GOWN STRL REUS W/ TWL LRG LVL3 (GOWN DISPOSABLE) ×1 IMPLANT
GOWN STRL REUS W/ TWL XL LVL3 (GOWN DISPOSABLE) ×1 IMPLANT
GOWN STRL REUS W/TWL 2XL LVL3 (GOWN DISPOSABLE) ×2 IMPLANT
GOWN STRL REUS W/TWL LRG LVL3 (GOWN DISPOSABLE) ×2
GOWN STRL REUS W/TWL XL LVL3 (GOWN DISPOSABLE) ×2
HALTER HD/CHIN CERV TRACTION D (MISCELLANEOUS) ×2 IMPLANT
KIT BASIN OR (CUSTOM PROCEDURE TRAY) ×2 IMPLANT
KIT TURNOVER KIT B (KITS) ×2 IMPLANT
MANIFOLD NEPTUNE II (INSTRUMENTS) ×2 IMPLANT
NDL 25GX 5/8IN NON SAFETY (NEEDLE) ×1 IMPLANT
NEEDLE 25GX 5/8IN NON SAFETY (NEEDLE) ×2 IMPLANT
NS IRRIG 1000ML POUR BTL (IV SOLUTION) ×2 IMPLANT
PACK ORTHO CERVICAL (CUSTOM PROCEDURE TRAY) ×2 IMPLANT
PAD ARMBOARD 7.5X6 YLW CONV (MISCELLANEOUS) ×4 IMPLANT
PATTIES SURGICAL .5 X.5 (GAUZE/BANDAGES/DRESSINGS) IMPLANT
PIN TEMP FIXATION KIRSCHNER (EXFIX) ×1 IMPLANT
PLATE ANT CERV XTEND 1 LV 16 (Plate) ×1 IMPLANT
POSITIONER HEAD DONUT 9IN (MISCELLANEOUS) ×2 IMPLANT
SCREW XTD VAR 4.2 SELF TAP 12 (Screw) ×4 IMPLANT
STRIP CLOSURE SKIN 1/2X4 (GAUZE/BANDAGES/DRESSINGS) ×2 IMPLANT
SURGIFLO W/THROMBIN 8M KIT (HEMOSTASIS) IMPLANT
SUT BONE WAX W31G (SUTURE) ×2 IMPLANT
SUT VIC AB 3-0 PS2 18 (SUTURE) ×1 IMPLANT
SUT VIC AB 4-0 PS2 27 (SUTURE) ×2 IMPLANT
SYR BULB EAR ULCER 3OZ GRN STR (SYRINGE) ×2 IMPLANT
TOWEL GREEN STERILE (TOWEL DISPOSABLE) ×2 IMPLANT
TOWEL GREEN STERILE FF (TOWEL DISPOSABLE) ×2 IMPLANT

## 2021-09-09 NOTE — Interval H&P Note (Signed)
History and Physical Interval Note:  09/09/2021 7:22 AM  Lisa Waters  has presented today for surgery, with the diagnosis of C6-7 central herniation, spondylosis.  The various methods of treatment have been discussed with the patient and family. After consideration of risks, benefits and other options for treatment, the patient has consented to  Procedure(s): C6-7 ANTERIOR CERVICAL DISCECTOMY FUSION, ALLOGRAFT, PLATE (N/A) as a surgical intervention.  The patient's history has been reviewed, patient examined, no change in status, stable for surgery.  I have reviewed the patient's chart and labs.  Questions were answered to the patient's satisfaction.     [name]

## 2021-09-09 NOTE — Progress Notes (Signed)
Orthopedic Tech Progress Note Patient Details:  Lisa Waters 03-May-1981 [id number]  Ortho Devices Type of Ortho Device: Soft collar Ortho Device/Splint Interventions: Ordered      Kariann Wecker A Bibi Economos 09/09/2021, 11:02 AM

## 2021-09-09 NOTE — Anesthesia Procedure Notes (Signed)
Procedure Name: Intubation Date/Time: 09/09/2021 7:41 AM Performed by: Cathren Harsh, CRNA Pre-anesthesia Checklist: Patient identified, Emergency Drugs available, Suction available and Patient being monitored Patient Re-evaluated:Patient Re-evaluated prior to induction Oxygen Delivery Method: Circle System Utilized Preoxygenation: Pre-oxygenation with 100% oxygen Induction Type: IV induction Ventilation: Mask ventilation without difficulty Laryngoscope Size: Glidescope and 3 Grade View: Grade I Tube type: Oral Tube size: 7.0 mm Number of attempts: 1 Airway Equipment and Method: Stylet and Oral airway Placement Confirmation: ETT inserted through vocal cords under direct vision, positive ETCO2 and breath sounds checked- equal and bilateral Secured at: 21 cm Tube secured with: Tape Dental Injury: Teeth and Oropharynx as per pre-operative assessment

## 2021-09-09 NOTE — Op Note (Signed)
Pre and postop diagnosis.  Left paracentral C6-7 HNP with radiculopathy.  Procedure: C6-7 anterior cervical discectomy and fusion, allograft and plate.  Surgeon: Rodell Perna, MD  Assistant: Benjiman Core, PA-C medically necessary and present for the entire procedure  Anesthesia: General oral tracheal +6 cc Marcaine local.  EBL less than 100 cc.  Implants:Implants  BONE CC-ACS 11X14 X8 6D - [id number]  Inventory Item: BONE CC-ACS 11X14 X8 6D Serial no.: [id number] Model/Cat no.: [id number]  Implant name: BONE CC-ACS 11X14 X8 6D - [id number] Laterality: N/A Area: Cervical Level 6-7  Manufacturer: Gerilyn Nestle FNDN Date of Manufacture:    Action: Implanted Number Used: 1   Device Identifier:  Device Identifier Type:     PLATE ANT CERV XTEND 1 LV 16 - [id number]  Inventory Item: PLATE ANT CERV XTEND 1 LV 16 Serial no.:  Model/Cat no.: [id number]  Implant name: PLATE ANT CERV XTEND 1 LV 16 - [id number] Laterality: N/A Area: Spine Cervical  Manufacturer: Winfred Date of Manufacture:    Action: Implanted Number Used: 1   Device Identifier:  Device Identifier Type:     SCREW XTD VAR 4.2 SELF TAP 12 - [id number]  Inventory Item: SCREW XTD VAR 4.2 SELF TAP 12 Serial no.:  Model/Cat no.: [id number]  Implant name: SCREW XTD VAR 4.2 SELF TAP 12 - [id number] Laterality: N/A Area: Spine Cervical  Manufacturer: [place] Date of Manufacture:    Action: Implanted Number Used: 4   Device Identifier:  Device Identifier Type:    Procedure: After standard prepping and draping intubation with a glide scope had ultra traction without weight neck was prepped with DuraPrep preoperative Ancef 2 g was given.  Area squared with towel sterile skin marker and skin fold 2 fingerbreadths above the clavicle on the left side.  Betadine Steri-Drape sterile Mayo stand the head thyroid sheets and drapes.  Timeout procedure was completed.  Incision was made subcutaneous tissue was divided with the  Bovie electrocautery platysma was split divided in line with the skin incision and blunt dissection with initial placement of the spinal needle at C7-T1.  We moved up to see 6-7 with 25 short needle and confirmed this with another C arm image that was sterilely draped.  Disc was immediately marked taking a chunk out of it with a scalpel and pituitaries.  Self-retaining tractors were placed and discectomy was performed using the operative microscope progressing back to posterior longitudinal ligament and removing left paracentral disc fragments that was causing pressure.  Posterior longitudinal ligament was taken down there was no extruded fragments.  Amount of the disc left paracentral was more impressive than the 07/19/2021 images on MRI.  Once will decompression trial sizes an 8 mm was selected which restored disc base height.  Traction was pulled by CRNA graft was countersunk 1 mm and then plate was selected globus.  412 mm screws were placed checking intermittently with C arm.  Once it was confirmed they are in good position tiny screwdriver was used locking all 4 screws repeat irrigation Hemovac placed in line with skin incision left side 3 oh platysma closure 4-0 Vicryl subcuticular closure tincture benzoin Steri-Strips 4 x 4's tape and soft collar.  Patient tolerated procedure well transferred care in stable condition.

## 2021-09-09 NOTE — Anesthesia Postprocedure Evaluation (Signed)
Anesthesia Post Note  Patient: Lisa Waters  Procedure(s) Performed: C6-7 ANTERIOR CERVICAL DISCECTOMY FUSION, ALLOGRAFT, PLATE     Patient location during evaluation: PACU Anesthesia Type: General Level of consciousness: awake and alert Pain management: pain level controlled Vital Signs Assessment: post-procedure vital signs reviewed and stable Respiratory status: spontaneous breathing, nonlabored ventilation, respiratory function stable and patient connected to nasal cannula oxygen Cardiovascular status: blood pressure returned to baseline and stable Postop Assessment: no apparent nausea or vomiting Anesthetic complications: no   No notable events documented.  Last Vitals:  Vitals:   09/09/21 0550 09/09/21 0941  BP: 119/80 119/67  Pulse: 86 98  Resp: 18   Temp: 36.9 C 36.6 C  SpO2: 100% 100%    Last Pain:  Vitals:   09/09/21 0941  TempSrc:   PainSc: Asleep    LLE Motor Response: Purposeful movement (09/09/21 0941) LLE Sensation: Full sensation (09/09/21 0941) RLE Motor Response: Purposeful movement (09/09/21 0941) RLE Sensation: Full sensation (09/09/21 0941)      Sonny Poth

## 2021-09-09 NOTE — Transfer of Care (Signed)
Immediate Anesthesia Transfer of Care Note  Patient: Lisa Waters  Procedure(s) Performed: C6-7 ANTERIOR CERVICAL DISCECTOMY FUSION, ALLOGRAFT, PLATE  Patient Location: PACU  Anesthesia Type:General  Level of Consciousness: awake, drowsy, patient cooperative and responds to stimulation  Airway & Oxygen Therapy: Patient Spontanous Breathing and Patient connected to nasal cannula oxygen  Post-op Assessment: Report given to RN and Post -op Vital signs reviewed and stable  Post vital signs: Reviewed and stable  Last Vitals:  Vitals Value Taken Time  BP 119/67 09/09/21 0941  Temp    Pulse 102 09/09/21 0941  Resp 13 09/09/21 0941  SpO2 100 % 09/09/21 0941  Vitals shown include unvalidated device data.  Last Pain:  Vitals:   09/09/21 0603  TempSrc:   PainSc: 6       Patients Stated Pain Goal: 3 (17/51/02 5852)  Complications: No notable events documented.

## 2021-09-10 DIAGNOSIS — Z87891 Personal history of nicotine dependence: Secondary | ICD-10-CM | POA: Diagnosis not present

## 2021-09-10 DIAGNOSIS — Z8509 Personal history of malignant neoplasm of other digestive organs: Secondary | ICD-10-CM | POA: Diagnosis not present

## 2021-09-10 DIAGNOSIS — J45909 Unspecified asthma, uncomplicated: Secondary | ICD-10-CM | POA: Diagnosis not present

## 2021-09-10 DIAGNOSIS — M4802 Spinal stenosis, cervical region: Secondary | ICD-10-CM | POA: Diagnosis not present

## 2021-09-10 MED ORDER — OXYCODONE-ACETAMINOPHEN 5-325 MG PO TABS: 1.0000 | ORAL_TABLET | Freq: Four times a day (QID) | ORAL | 0 refills | Status: AC | PRN

## 2021-09-10 NOTE — Evaluation (Signed)
Occupational Therapy Evaluation Patient Details Name: Lisa Waters MRN: [id number] DOB: 18-Aug-1980 Today's Date: 09/10/2021   History of Present Illness Lisa Waters is a 41 yo female who underwent C6-7 ANTERIOR CERVICAL DISCECTOMY FUSION, ALLOGRAFT, PLATE 2/24. PMHx:   Clinical Impression   Lisa Waters was evaluated s/p the above neck surgery, she is indep at baseline including working and driving. She lives in a multilevel home with her husband who is able to assist as needed. After review of cervical precautions and compensatory techniques, pt demonstrated good ability to complete BADLs while maintaining cervical precautions. Overall supervision level for ADLs and mobility without AD. Pt does not required further OT services. Recommend d/c to home with support of family.      Recommendations for follow up therapy are one component of a multi-disciplinary discharge planning process, led by the attending physician.  Recommendations may be updated based on patient status, additional functional criteria and insurance authorization.   Follow Up Recommendations  No OT follow up    Assistance Recommended at Discharge Intermittent Supervision/Assistance  Patient can return home with the following A little help with bathing/dressing/bathroom;A little help with walking and/or transfers;Assist for transportation;Help with stairs or ramp for entrance    Functional Status Assessment  Patient has had a recent decline in their functional status and demonstrates the ability to make significant improvements in function in a reasonable and predictable amount of time.  Equipment Recommendations  None recommended by OT       Precautions / Restrictions Precautions Precautions: Cervical Precaution Booklet Issued: Yes (comment) Required Braces or Orthoses: Cervical Brace Cervical Brace: Soft collar;At all times Restrictions Weight Bearing Restrictions: No      Mobility Bed Mobility Overal bed mobility:  Needs Assistance Bed Mobility: Sidelying to Sit, Rolling Rolling: Supervision Sidelying to sit: Supervision       General bed mobility comments: cues for log roll    Transfers Overall transfer level: Needs assistance Equipment used: None Transfers: Sit to/from Stand Sit to Stand: Supervision                  Balance Overall balance assessment: Needs assistance Sitting-balance support: Feet supported Sitting balance-Leahy Scale: Good     Standing balance support: No upper extremity supported, During functional activity Standing balance-Leahy Scale: Fair                             ADL either performed or assessed with clinical judgement   ADL Overall ADL's : Needs assistance/impaired                                       General ADL Comments: Overall supervision level ADLs this session with verbal cues to educate on compensatory techniques. Pt demonstrated great understanding of cervical precautions wtih ADLs. She is able to obtain the figure four position to complete LB dressing. No AD required     Vision Baseline Vision/History: 0 No visual deficits Ability to See in Adequate Light: 0 Adequate Patient Visual Report: No change from baseline Vision Assessment?: No apparent visual deficits            Pertinent Vitals/Pain Pain Assessment Pain Assessment: 0-10 Pain Score: 6  Pain Location: neck & throat Pain Descriptors / Indicators: Discomfort, Grimacing Pain Intervention(s): Limited activity within patient's tolerance, Monitored during session     Hand Dominance  Extremity/Trunk Assessment Upper Extremity Assessment Upper Extremity Assessment: Overall WFL for tasks assessed   Lower Extremity Assessment Lower Extremity Assessment: Overall WFL for tasks assessed   Cervical / Trunk Assessment Cervical / Trunk Assessment: Neck Surgery   Communication Communication Communication: No difficulties   Cognition  Arousal/Alertness: Awake/alert Behavior During Therapy: WFL for tasks assessed/performed Overall Cognitive Status: Within Functional Limits for tasks assessed               General Comments: verbalized under standing of neck precautions and compensatory techniques after review     General Comments  VSS on RA     Home Living Family/patient expects to be discharged to:: Private residence Living Arrangements: Spouse/significant other;Parent Available Help at Discharge: Family;Available 24 hours/day Type of Home: House Home Access: Stairs to enter [employer] of Steps: 3 Entrance Stairs-Rails: None Home Layout: Multi-level;Able to live on main level with bedroom/bathroom     Bathroom Shower/Tub: Walk-in shower   Bathroom Toilet: Handicapped height     Home Equipment: Conservation officer, nature (2 wheels)          Prior Functioning/Environment Prior Level of Function : Independent/Modified Independent;Driving;Working/employed             Mobility Comments: no AD ADLs Comments: 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[employer], pt does a lot of the computer work        OT Problem List: Decreased range of motion;Decreased activity tolerance;Decreased knowledge of precautions;Pain         OT Goals(Current goals can be found in the care plan section) Acute Rehab OT Goals Patient Stated Goal: home OT Goal Formulation: All assessment and education complete, DC therapy         AM-PAC OT "6 Clicks" Daily Activity     Outcome Measure Help from another person eating meals?: None Help from another person taking care of personal grooming?: A Little Help from another person toileting, which includes using toliet, bedpan, or urinal?: A Little Help from another person bathing (including washing, rinsing, drying)?: A Little Help from another person to put on and taking off regular upper body clothing?: None Help from another person to put on and taking off regular lower body clothing?: A Little 6  Click Score: 20   End of Session Equipment Utilized During Treatment: Cervical collar Nurse Communication: Mobility status  Activity Tolerance: Patient tolerated treatment well Patient left: in bed  OT Visit Diagnosis: Other abnormalities of gait and mobility (R26.89);Muscle weakness (generalized) (M62.81);Pain                Time: 7867-6720 OT Time Calculation (min): 18 min Charges:  OT General Charges $OT Visit: 1 Visit OT Evaluation $OT Eval Low Complexity: 1 Low   Lisa Waters 09/10/2021, 9:57 AM

## 2021-09-10 NOTE — Progress Notes (Addendum)
Patient alert and oriented, mae's well, voiding adequate amount of urine, swallowing without difficulty, no c/o pain at time of discharge. Patient discharged home with family. Script and discharged instructions given to patient. Patient and family stated understanding of instructions given. Patient has an appointment with Dr. Lorin Mercy in 2 weeks

## 2021-09-10 NOTE — Discharge Instructions (Signed)
Keep collar on at all times.  Use your extra collar wrapped with Saran wrap when you take a shower and then after you get out of the shower and dry off reapply the dry collar.  Your pain medicine was sent to your pharmacy.  Stay with soft foods liquids since sometimes you may have difficulty swallowing for a week or so.  He will see Dr. Lorin Mercy in 1 week.  He will have less problems swallowing and less pain if you are in a recliner beachchair position.

## 2021-09-10 NOTE — Progress Notes (Signed)
Patient ID: Lisa Waters, female   DOB: 1981/03/19, 41 y.o.   MRN: [id number]   Subjective: 1 Day Post-Op Procedure(s) (LRB): C6-7 ANTERIOR CERVICAL DISCECTOMY FUSION, ALLOGRAFT, PLATE (N/A) Patient reports pain as mild.  Neck pain gone.   Objective: Vital signs in last 24 hours: Temp:  [97.5 F (36.4 C)-98.5 F (36.9 C)] 97.9 F (36.6 C) (02/25 0801) Pulse Rate:  [88-112] 88 (02/25 0801) Resp:  [15-20] 16 (02/25 0801) BP: (104-137)/(67-90) 104/70 (02/25 0801) SpO2:  [94 %-100 %] 100 % (02/25 0801)  Intake/Output from previous day: 02/24 0701 - 02/25 0700 In: 1300 [I.V.:1300] Out: 85 [Drains:35; Blood:50] Intake/Output this shift: No intake/output data recorded.  No results for input(s): HGB in the last 72 hours. No results for input(s): WBC, RBC, HCT, PLT in the last 72 hours. No results for input(s): NA, K, CL, CO2, BUN, CREATININE, GLUCOSE, CALCIUM in the last 72 hours. No results for input(s): LABPT, INR in the last 72 hours.  Neurologically intact DG Cervical Spine 2 or 3 views  Result Date: 09/09/2021 CLINICAL DATA:  41 year old female undergoing ACDF. EXAM: CERVICAL SPINE - 2-3 VIEW COMPARISON:  07/19/2021 FINDINGS: Two fluoroscopic images demonstrate anterior plate and screw fixation of C6 and C7 without complicating features. IMPRESSION: Intraoperative images demonstrating ACDF of C6-C7. Electronically Signed   By: Ruthann Cancer M.D.   On: 09/09/2021 09:38   DG C-Arm 1-60 Min-No Report  Result Date: 09/09/2021 Fluoroscopy was utilized by the requesting physician.  No radiographic interpretation.   DG C-Arm 1-60 Min-No Report  Result Date: 09/09/2021 Fluoroscopy was utilized by the requesting physician.  No radiographic interpretation.    Assessment/Plan: 1 Day Post-Op Procedure(s) (LRB): C6-7 ANTERIOR CERVICAL DISCECTOMY FUSION, ALLOGRAFT, PLATE (N/A) Plan: discharge home.   Lisa Waters 09/10/2021, 8:48 AM

## 2021-09-12 ENCOUNTER — Encounter (HOSPITAL_COMMUNITY): Payer: Self-pay | Admitting: Orthopaedic Surgery

## 2021-09-16 ENCOUNTER — Encounter: Payer: Self-pay | Admitting: Orthopaedic Surgery

## 2021-09-16 ENCOUNTER — Other Ambulatory Visit: Payer: Self-pay

## 2021-09-16 ENCOUNTER — Ambulatory Visit (INDEPENDENT_AMBULATORY_CARE_PROVIDER_SITE_OTHER): Payer: BC Managed Care – PPO

## 2021-09-16 ENCOUNTER — Ambulatory Visit (INDEPENDENT_AMBULATORY_CARE_PROVIDER_SITE_OTHER): Payer: BC Managed Care – PPO | Admitting: Orthopaedic Surgery

## 2021-09-16 VITALS — BP 126/83 | HR 92 | Ht 67.0 in | Wt 161.0 lb

## 2021-09-16 DIAGNOSIS — Z981 Arthrodesis status: Secondary | ICD-10-CM

## 2021-09-16 NOTE — Progress Notes (Signed)
? ?Post-Op Visit Note ?  ?Patient: Lisa Waters           ?Date of Birth: Oct 13, 1980           ?MRN: [id number] ?Visit Date: 09/16/2021 ?PCP: Denita Lung, MD ? ? ?Assessment & Plan: Follow-up 1 week post C6-7 fusion.  Radiculopathy pain is gone she is off her pain medication she states she feels "wonderful."Continue collar return 5 weeks lateral flexion-extension x-ray on return. ? ?Chief Complaint:  ?Chief Complaint  ?Patient presents with  ? Neck - Follow-up  ?  09/09/2021 C6-7 ACDF  ? ?Visit Diagnoses:  ?1. Status post cervical spinal fusion   ? ? ?Plan: ROV 5 wks ? ?Follow-Up Instructions: Return in about 5 weeks (around 10/21/2021).  ? ?Orders:  ?Orders Placed This Encounter  ?Procedures  ? XR Cervical Spine 2 or 3 views  ? ?No orders of the defined types were placed in this encounter. ? ? ?Imaging: ?No results found. ? ?PMFS History: ?Patient Active Problem List  ? Diagnosis Date Noted  ? Cervical spinal stenosis 09/09/2021  ? Protrusion of cervical intervertebral disc 08/08/2021  ? Other spondylosis with radiculopathy, cervical region 07/24/2021  ? Preop cardiovascular exam 11/29/2020  ? Hyperlipidemia with target LDL less than 130 08/31/2020  ? Essential hypertension 08/30/2020  ? Cancer of alimentary canal/tract ([place]) 12/08/2019  ? Closed fracture of distal phalanx of finger 04/17/2019  ? Sprain of right wrist 04/17/2019  ? Sleep disturbance 03/03/2019  ? RAD (reactive airway disease) 02/26/2019  ? Tachycardia 02/26/2019  ? History of radiation therapy 02/26/2019  ? Increased risk of breast cancer 02/26/2019  ? Vitamin D deficiency 11/12/2018  ? Depression, major, in remission ([place]) 11/12/2018  ? Aphthous ulcer 05/13/2018  ? High risk medication use 05/13/2018  ? Sore throat 12/07/2017  ? Acute non-recurrent maxillary sinusitis 12/07/2017  ? Vaccine counseling 07/26/2017  ? Breast mass, left 09/08/2016  ? Routine general medical examination at a health care facility 05/30/2016  ? Other specified  hypothyroidism 04/01/2015  ? Encounter for health maintenance examination in adult 04/01/2015  ? Influenza vaccination declined 04/01/2015  ? Generalized anxiety disorder 04/01/2015  ? Asthma, mild intermittent 04/01/2015  ? Oral ulcer 04/01/2015  ? Biceps tendonitis on left 07/27/2014  ? Subacromial or subdeltoid bursitis 07/27/2014  ? Impingement syndrome of left shoulder 06/09/2014  ? Left shoulder pain 05/26/2014  ? Major depression, recurrent ([place]) 05/25/2013  ? Allergic rhinitis 01/20/2013  ? Recurrent infections 01/20/2013  ? Moderate mitral regurgitation by prior echocardiogram 12/28/2012  ? Palpitation 10/25/2012  ? Female infertility of other specified origin 04/09/2012  ? Raynauds disease 06/29/2011  ? History of nodular sclerosis Hodgkin's disease 06/29/2011  ? Hodgkin's disease in remission ([place]) 12/29/2010  ? MVP (mitral valve prolapse) 12/29/2010  ? ?Past Medical History:  ?Diagnosis Date  ? Anxiety   ? Asthma   ? mild intermittent as of 07/2017 - just when patient gets sick  ? Depression   ? Female infertility   ? GERD (gastroesophageal reflux disease)   ? Hx - no current problems, no meds  ? H/O amaurosis fugax Fall 2012  ? Resolved, no current problems, Hx-MRI of brain/brain stem: no acute abnormality, small area of encephalomalacia left superior cerebellum that could be prior trauma, infection, or lacunar infarct  ? History of Hodgkin's lymphoma 2007  ? in remission, sees [facility], prior with Dr. Elwanda Brooklyn; prior chemotherapy and radiation therapy; sees yearly in December, no current problems  ? Hyperlipidemia 07/2017  ?  diet control, no meds  ? Hypothyroidism   ? Moderate mitral regurgitation by prior echocardiogram 09/2010  ? 10/2015: EF 55-60%. Normal WM. Normal D Fxn. Bilateral MVP w/ Mod MR (very eccentric). Normal LA, RV & RA. Normal PAP.  ? MVP (mitral valve prolapse) 09/2010  ? Dr. Ellyn Hack, [facility]; a) Echo 3/'12: Mod MR; EF 60-65%; b) TEE 5/'12: Nl LV Fxn, EF 60-65%, mild, holosystolic  prolapse of the medial anterior leaflet. Mod MR . c) Echo 3/'15: Moderate, holosystolic prolapse of  medial. Mod MR d) TM Stress Echo (@ [facility]) Nl Lv Fxn, Mild-Mod MR @ Res0 --> Mod MR at HR 181 bpm (7:22 min, 10.10 METS) w/p WMA  ? Oral ulcer   ? Palpitations 10/2012  ? holter monitor, Dr. Ellyn Hack; no arrhythmia, no current problems  ? PONV (postoperative nausea and vomiting)   ?  ?Family History  ?Problem Relation Age of Onset  ? Skin cancer Father   ? Cancer Paternal Grandmother   ?     metastasis  ? Alcohol abuse Mother   ? Emphysema Paternal Grandfather   ?     was a smoker, heart problems  ? Diabetes Maternal Aunt   ? OCD Maternal Aunt   ? Bipolar disorder Maternal Aunt   ? Stroke Maternal Grandmother   ? Cancer Maternal Aunt   ?     skin  ? Heart disease Neg Hx   ? Hypertension Neg Hx   ? Hyperlipidemia Neg Hx   ?  ?Past Surgical History:  ?Procedure Laterality Date  ? ANTERIOR CERVICAL DECOMP/DISCECTOMY FUSION N/A 09/09/2021  ? Procedure: C6-7 ANTERIOR CERVICAL DISCECTOMY FUSION, ALLOGRAFT, PLATE;  Surgeon: [name], MD;  Location: [place];  Service: Orthopedics;  Laterality: N/A;  ? APPENDECTOMY  1993  ? BREAST ENHANCEMENT SURGERY  2002  ? COLONOSCOPY  07/2014  ? Dr. Collene Mares - normal  ? [place] N/A 01/29/2018  ? Procedure: [place] POLYPECTOMY;  Surgeon: Charyl Bigger, MD;  Location: [facility];  Service: Gynecology;  Laterality: N/A;  ? ESOPHAGOGASTRODUODENOSCOPY  04/11/2013  ? [facility] Dr. Collene Mares  ? FOOT SURGERY Bilateral   ? on toes  ? INSERTION CENTRAL VENOUS ACCESS DEVICE W/ SUBCUTANEOUS PORT  2006  ? LYMPH NODE BIOPSY  2006  ? under right arm  ? MASTECTOMY Bilateral 02/2020  ? double mastectomy. Has had 9 surgical procedures on breast since then.  ? [place]  12/2010  ? persantine myoview - moderate breast attenuation (fixed mid-distal anterior defect), EF 68%, low risk scan  ? RHINOPLASTY   2007  ? deviated septum  ? TEE WITHOUT CARDIOVERSION  11/2010  ? Nl LV Fxn, EF 60-65%, mild, holosystolic prolapse of the medial anterior leaflet. Mod MR .   ? TRANSTHORACIC ECHOCARDIOGRAM  3/'14; 3/'15  ? LVEF 60-65%, wall motion normal, LV function normal, moderate holosystolic MVP (medial the middle scallop of the posterior leaflet), moderate regurgitation (directed posteriorly), no shunt -- stable over 2 years  ? TRANSTHORACIC ECHOCARDIOGRAM  10/2015  ? EF 55-60%. Normal WM. Normal D Fxn. Bilateral MVP w/ Mod MR (very eccentric). Normal LA, RV & RA. Normal PAP.  ? TRANSTHORACIC ECHOCARDIOGRAM  10/22/2019  ? EF 55 to 60%.  Mild LVH.  GRII DD.  Elevated LAP.  Mild LA dilation.  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  Normal aortic valve.  (Stable  ? WISDOM TOOTH EXTRACTION    ? ?Social History  ? ?  Occupational History  ? Occupation: works in Merchandiser, retail  ?  Employer: [employer]  ?Tobacco Use  ? Smoking status: Former  ?  Packs/day: 1.00  ?  Years: 10.00  ?  Pack years: 10.00  ?  Types: Cigarettes  ?  Quit date: 11/07/2009  ?  Years since quitting: 11.8  ? Smokeless tobacco: Never  ?Vaping Use  ? Vaping Use: Never used  ?Substance and Sexual Activity  ? Alcohol use: Yes  ?  Alcohol/week: 1.0 - 2.0 standard drink  ?  Types: 1 - 2 Cans of beer per week  ?  Comment: social Beer/Liquor  ? Drug use: No  ? Sexual activity: Yes  ?  Partners: Male  ?  Birth control/protection: None  ? ? ? ?

## 2021-10-19 ENCOUNTER — Encounter: Payer: Self-pay | Admitting: Orthopaedic Surgery

## 2021-10-19 ENCOUNTER — Ambulatory Visit: Payer: Self-pay

## 2021-10-19 ENCOUNTER — Ambulatory Visit (INDEPENDENT_AMBULATORY_CARE_PROVIDER_SITE_OTHER): Payer: BC Managed Care – PPO | Admitting: Orthopaedic Surgery

## 2021-10-19 VITALS — BP 126/83 | HR 96 | Ht 67.0 in | Wt 161.0 lb

## 2021-10-19 DIAGNOSIS — Z981 Arthrodesis status: Secondary | ICD-10-CM

## 2021-10-19 NOTE — Progress Notes (Signed)
? ?Post-Op Visit Note ?  ?Patient: Lisa Waters           ?Date of Birth: [birth year]           ?MRN: [id number] ?Visit Date: 10/19/2021 ?PCP: Denita Lung, MD ? ? ?Assessment & Plan: Follow-up single level fusion C6-7.  Flexion-extension x-rays show no motion.  Discontinue collar she is already back working since she is self-employed happy with surgical result return as needed. ? ?Chief Complaint:  ?Chief Complaint  ?Patient presents with  ? Neck - Routine Post Op  ?  09/09/2021 C6-7 ACDF  ? ?Visit Diagnoses:  ?1. Status post cervical spinal fusion   ? ? ?Plan: Return as needed. ? ?Follow-Up Instructions: Return if symptoms worsen or fail to improve.  ? ?Orders:  ?Orders Placed This Encounter  ?Procedures  ? XR Cervical Spine 2 or 3 views  ? ?No orders of the defined types were placed in this encounter. ? ? ?Imaging: ?No results found. ? ?PMFS History: ?Patient Active Problem List  ? Diagnosis Date Noted  ? Cervical spinal stenosis 09/09/2021  ? Protrusion of cervical intervertebral disc 08/08/2021  ? Other spondylosis with radiculopathy, cervical region 07/24/2021  ? Preop cardiovascular exam 11/29/2020  ? Hyperlipidemia with target LDL less than 130 08/31/2020  ? Essential hypertension 08/30/2020  ? Cancer of alimentary canal/tract ([place]) 12/08/2019  ? Closed fracture of distal phalanx of finger 04/17/2019  ? Sprain of right wrist 04/17/2019  ? Sleep disturbance 03/03/2019  ? RAD (reactive airway disease) 02/26/2019  ? Tachycardia 02/26/2019  ? History of radiation therapy 02/26/2019  ? Increased risk of breast cancer 02/26/2019  ? Vitamin D deficiency 11/12/2018  ? Depression, major, in remission ([place]) 11/12/2018  ? Aphthous ulcer 05/13/2018  ? High risk medication use 05/13/2018  ? Sore throat 12/07/2017  ? Acute non-recurrent maxillary sinusitis 12/07/2017  ? Vaccine counseling 07/26/2017  ? Breast mass, left 09/08/2016  ? Routine general medical examination at a health care facility 05/30/2016  ? Other  specified hypothyroidism 04/01/2015  ? Encounter for health maintenance examination in adult 04/01/2015  ? Influenza vaccination declined 04/01/2015  ? Generalized anxiety disorder 04/01/2015  ? Asthma, mild intermittent 04/01/2015  ? Oral ulcer 04/01/2015  ? Biceps tendonitis on left 07/27/2014  ? Subacromial or subdeltoid bursitis 07/27/2014  ? Impingement syndrome of left shoulder 06/09/2014  ? Left shoulder pain 05/26/2014  ? Major depression, recurrent ([place]) 05/25/2013  ? Allergic rhinitis 01/20/2013  ? Recurrent infections 01/20/2013  ? Moderate mitral regurgitation by prior echocardiogram 12/28/2012  ? Palpitation 10/25/2012  ? Female infertility of other specified origin 04/09/2012  ? Raynauds disease 06/29/2011  ? History of nodular sclerosis Hodgkin's disease 06/29/2011  ? Hodgkin's disease in remission ([place]) 12/29/2010  ? MVP (mitral valve prolapse) 12/29/2010  ? ?Past Medical History:  ?Diagnosis Date  ? Anxiety   ? Asthma   ? mild intermittent as of 07/2017 - just when patient gets sick  ? Depression   ? Female infertility   ? GERD (gastroesophageal reflux disease)   ? Hx - no current problems, no meds  ? H/O amaurosis fugax Fall 2012  ? Resolved, no current problems, Hx-MRI of brain/brain stem: no acute abnormality, small area of encephalomalacia left superior cerebellum that could be prior trauma, infection, or lacunar infarct  ? History of Hodgkin's lymphoma 2007  ? in remission, sees [facility], prior with Dr. Elwanda Brooklyn; prior chemotherapy and radiation therapy; sees yearly in December, no current problems  ?  Hyperlipidemia 07/2017  ? diet control, no meds  ? Hypothyroidism   ? Moderate mitral regurgitation by prior echocardiogram 09/2010  ? 10/2015: EF 55-60%. Normal WM. Normal D Fxn. Bilateral MVP w/ Mod MR (very eccentric). Normal LA, RV & RA. Normal PAP.  ? MVP (mitral valve prolapse) 09/2010  ? Dr. Ellyn Hack, [facility]; a) Echo 3/'12: Mod MR; EF 60-65%; b) TEE 5/'12: Nl LV Fxn, EF 60-65%, mild,  holosystolic prolapse of the medial anterior leaflet. Mod MR . c) Echo 3/'15: Moderate, holosystolic prolapse of  medial. Mod MR d) TM Stress Echo (@ [facility]) Nl Lv Fxn, Mild-Mod MR @ Res0 --> Mod MR at HR 181 bpm (7:22 min, 10.10 METS) w/p WMA  ? Oral ulcer   ? Palpitations 10/2012  ? holter monitor, Dr. Ellyn Hack; no arrhythmia, no current problems  ? PONV (postoperative nausea and vomiting)   ?  ?Family History  ?Problem Relation Age of Onset  ? Skin cancer Father   ? Cancer Paternal Grandmother   ?     metastasis  ? Alcohol abuse Mother   ? Emphysema Paternal Grandfather   ?     was a smoker, heart problems  ? Diabetes Maternal Aunt   ? OCD Maternal Aunt   ? Bipolar disorder Maternal Aunt   ? Stroke Maternal Grandmother   ? Cancer Maternal Aunt   ?     skin  ? Heart disease Neg Hx   ? Hypertension Neg Hx   ? Hyperlipidemia Neg Hx   ?  ?Past Surgical History:  ?Procedure Laterality Date  ? ANTERIOR CERVICAL DECOMP/DISCECTOMY FUSION N/A 09/09/2021  ? Procedure: C6-7 ANTERIOR CERVICAL DISCECTOMY FUSION, ALLOGRAFT, PLATE;  Surgeon: [name], MD;  Location: [place];  Service: Orthopedics;  Laterality: N/A;  ? APPENDECTOMY  1993  ? BREAST ENHANCEMENT SURGERY  2002  ? COLONOSCOPY  07/2014  ? Dr. Collene Mares - normal  ? [place] N/A 01/29/2018  ? Procedure: [place] POLYPECTOMY;  Surgeon: Charyl Bigger, MD;  Location: [facility];  Service: Gynecology;  Laterality: N/A;  ? ESOPHAGOGASTRODUODENOSCOPY  04/11/2013  ? [facility] Dr. Collene Mares  ? FOOT SURGERY Bilateral   ? on toes  ? INSERTION CENTRAL VENOUS ACCESS DEVICE W/ SUBCUTANEOUS PORT  2006  ? LYMPH NODE BIOPSY  2006  ? under right arm  ? MASTECTOMY Bilateral 02/2020  ? double mastectomy. Has had 9 surgical procedures on breast since then.  ? [place]  12/2010  ? persantine myoview - moderate breast attenuation (fixed mid-distal anterior defect), EF 68%, low risk scan  ?  RHINOPLASTY  2007  ? deviated septum  ? TEE WITHOUT CARDIOVERSION  11/2010  ? Nl LV Fxn, EF 60-65%, mild, holosystolic prolapse of the medial anterior leaflet. Mod MR .   ? TRANSTHORACIC ECHOCARDIOGRAM  3/'14; 3/'15  ? LVEF 60-65%, wall motion normal, LV function normal, moderate holosystolic MVP (medial the middle scallop of the posterior leaflet), moderate regurgitation (directed posteriorly), no shunt -- stable over 2 years  ? TRANSTHORACIC ECHOCARDIOGRAM  10/2015  ? EF 55-60%. Normal WM. Normal D Fxn. Bilateral MVP w/ Mod MR (very eccentric). Normal LA, RV & RA. Normal PAP.  ? TRANSTHORACIC ECHOCARDIOGRAM  10/22/2019  ? EF 55 to 60%.  Mild LVH.  GRII DD.  Elevated LAP.  Mild LA dilation.  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  Normal aortic valve.  (Stable  ? WISDOM TOOTH EXTRACTION    ? ?  Social History  ? ?Occupational History  ? Occupation: works in Merchandiser, retail  ?  Employer: [employer]  ?Tobacco Use  ? Smoking status: Former  ?  Packs/day: 1.00  ?  Years: 10.00  ?  Pack years: 10.00  ?  Types: Cigarettes  ?  Quit date: 11/07/2009  ?  Years since quitting: 11.9  ? Smokeless tobacco: Never  ?Vaping Use  ? Vaping Use: Never used  ?Substance and Sexual Activity  ? Alcohol use: Yes  ?  Alcohol/week: 1.0 - 2.0 standard drink  ?  Types: 1 - 2 Cans of beer per week  ?  Comment: social Beer/Liquor  ? Drug use: No  ? Sexual activity: Yes  ?  Partners: Male  ?  Birth control/protection: None  ? ? ? ?

## 2021-10-28 ENCOUNTER — Ambulatory Visit (HOSPITAL_COMMUNITY): Payer: BC Managed Care – PPO | Attending: Internal Medicine

## 2021-10-28 DIAGNOSIS — I340 Nonrheumatic mitral (valve) insufficiency: Secondary | ICD-10-CM | POA: Insufficient documentation

## 2021-10-28 DIAGNOSIS — I341 Nonrheumatic mitral (valve) prolapse: Secondary | ICD-10-CM | POA: Diagnosis not present

## 2021-10-28 LAB — ECHOCARDIOGRAM COMPLETE
Area-P 1/2: 4.8 cm2
MV M vel: 5.78 m/s
MV Peak grad: 133.6 mmHg
Radius: 0.55 cm
S' Lateral: 3.4 cm

## 2021-10-31 DIAGNOSIS — N651 Disproportion of reconstructed breast: Secondary | ICD-10-CM | POA: Diagnosis not present

## 2021-10-31 DIAGNOSIS — Z9013 Acquired absence of bilateral breasts and nipples: Secondary | ICD-10-CM | POA: Diagnosis not present

## 2021-10-31 DIAGNOSIS — Z9889 Other specified postprocedural states: Secondary | ICD-10-CM | POA: Diagnosis not present

## 2021-11-01 ENCOUNTER — Encounter: Payer: Self-pay | Admitting: Family Medicine

## 2021-11-01 ENCOUNTER — Ambulatory Visit (INDEPENDENT_AMBULATORY_CARE_PROVIDER_SITE_OTHER): Payer: BC Managed Care – PPO | Admitting: Family Medicine

## 2021-11-01 VITALS — BP 124/82 | HR 86 | Temp 97.9°F | Ht 65.5 in | Wt 171.4 lb

## 2021-11-01 DIAGNOSIS — E785 Hyperlipidemia, unspecified: Secondary | ICD-10-CM | POA: Diagnosis not present

## 2021-11-01 DIAGNOSIS — I700 Atherosclerosis of aorta: Secondary | ICD-10-CM | POA: Diagnosis not present

## 2021-11-01 DIAGNOSIS — Z9889 Other specified postprocedural states: Secondary | ICD-10-CM

## 2021-11-01 DIAGNOSIS — C8190 Hodgkin lymphoma, unspecified, unspecified site: Secondary | ICD-10-CM | POA: Diagnosis not present

## 2021-11-01 DIAGNOSIS — Z7185 Encounter for immunization safety counseling: Secondary | ICD-10-CM

## 2021-11-01 DIAGNOSIS — Z1159 Encounter for screening for other viral diseases: Secondary | ICD-10-CM | POA: Diagnosis not present

## 2021-11-01 DIAGNOSIS — I7300 Raynaud's syndrome without gangrene: Secondary | ICD-10-CM | POA: Diagnosis not present

## 2021-11-01 DIAGNOSIS — F411 Generalized anxiety disorder: Secondary | ICD-10-CM

## 2021-11-01 DIAGNOSIS — F3341 Major depressive disorder, recurrent, in partial remission: Secondary | ICD-10-CM

## 2021-11-01 DIAGNOSIS — Z8616 Personal history of COVID-19: Secondary | ICD-10-CM

## 2021-11-01 DIAGNOSIS — I341 Nonrheumatic mitral (valve) prolapse: Secondary | ICD-10-CM | POA: Diagnosis not present

## 2021-11-01 DIAGNOSIS — J309 Allergic rhinitis, unspecified: Secondary | ICD-10-CM

## 2021-11-01 DIAGNOSIS — E038 Other specified hypothyroidism: Secondary | ICD-10-CM | POA: Diagnosis not present

## 2021-11-01 DIAGNOSIS — Z0000 Encounter for general adult medical examination without abnormal findings: Secondary | ICD-10-CM | POA: Diagnosis not present

## 2021-11-01 DIAGNOSIS — I10 Essential (primary) hypertension: Secondary | ICD-10-CM

## 2021-11-01 DIAGNOSIS — M4802 Spinal stenosis, cervical region: Secondary | ICD-10-CM

## 2021-11-01 MED ORDER — SERTRALINE HCL 100 MG PO TABS: 100.0000 mg | ORAL_TABLET | Freq: Every day | ORAL | 3 refills | Status: DC

## 2021-11-01 MED ORDER — METOPROLOL SUCCINATE ER 50 MG PO TB24: 50.0000 mg | ORAL_TABLET | Freq: Every day | ORAL | 3 refills | Status: DC

## 2021-11-01 NOTE — Progress Notes (Signed)
? ?  Subjective:  ? ? Patient ID: Lisa Waters, female    DOB: [birth year], 41 y.o.   MRN: [id number] ? ?HPI ?She is here for complete examination.  She has remote history of Hodgkin's lymphoma and subsequent radiation.  Because of that she has had breast removal and has had difficulties with the reconstruction.  She has another surgery scheduled in the near future.  She does have a history of mitral valve prolapse and recently had an echocardiogram done.  She also recently had C-spine surgery and is doing quite nicely.  Dr. Vinie Sill did that for her.  She has had Pap and pelvic already done.  She is on thyroid medication and needs to have this reevaluated.  She continues on sertraline.  She has had a rough time over the last several years and sertraline seems to have helped a lot.  She is not interested in stopping that medication at the present time.  She does have Raynaud's disease and knows to keep her hands warm.  She has x-ray evidence of aortic atherosclerosis.  She does have a history of hyperlipidemia.  Presently she is on no medications.  Otherwise her family and social history as well as health maintenance and immunizations was reviewed. ? ? ?Review of Systems  ?All other systems reviewed and are negative. ? ?   ?Objective:  ? Physical Exam ?Alert and in no distress. Tympanic membranes and canals are normal. Pharyngeal area is normal. Neck is supple without adenopathy or thyromegaly. Cardiac exam shows a regular sinus rhythm without murmurs or gallops. Lungs are clear to auscultation. ? ? ? ? ?   ?Assessment & Plan:  ?Routine general medical examination at a health care facility ? ?Hodgkin's disease in remission [facility]) ? ?MVP (mitral valve prolapse) ? ?Raynaud's disease without gangrene ? ?Essential hypertension ? ?Hyperlipidemia with target LDL less than 130 ? ?Other specified hypothyroidism ? ?Recurrent major depressive disorder, in partial remission ([place]) ? ?Encounter for health maintenance examination in  adult ? ?Cervical spinal stenosis ? ?Allergic rhinitis, unspecified seasonality, unspecified trigger ? ?Vaccine counseling ? ?History of cervical discectomy ? ?History of COVID-19 ? ?Aortic atherosclerosis ([place]) ?She will continue on her present medications.  No therapy needed for her allergies.  I will check her TSH to see how she is doing there.She knows how to take care of her Raynaud's disease. ?Discussed COVID-vaccine with her and at this time she is not interested.  Continue on sertraline.  Might consider stopping this again by tapering it at a later date.  She was comfortable with that. ?

## 2021-11-02 LAB — LIPID PANEL
Chol/HDL Ratio: 3.1 ratio (ref 0.0–4.4)
Cholesterol, Total: 264 mg/dL — ABNORMAL HIGH (ref 100–199)
HDL: 85 mg/dL (ref >39)
LDL Chol Calc (NIH): 143 mg/dL — ABNORMAL HIGH (ref 0–99)
Triglycerides: 204 mg/dL — ABNORMAL HIGH (ref 0–149)
VLDL Cholesterol Cal: 36 mg/dL (ref 5–40)

## 2021-11-02 LAB — HEPATITIS C ANTIBODY: Hep C Virus Ab: NONREACTIVE

## 2021-11-02 LAB — TSH: TSH: 3.11 u[IU]/mL (ref 0.450–4.500)

## 2021-11-02 MED ORDER — SYNTHROID 88 MCG PO TABS: 88.0000 ug | ORAL_TABLET | Freq: Every day | ORAL | 3 refills | Status: DC

## 2021-11-02 NOTE — Addendum Note (Signed)
Addended by: Denita Lung on: 11/02/2021 05:56 PM ? ? Modules accepted: Orders ? ?

## 2021-11-03 HISTORY — PX: TRANSTHORACIC ECHOCARDIOGRAM: SHX275

## 2021-11-09 ENCOUNTER — Telehealth: Payer: BC Managed Care – PPO | Admitting: Physician Assistant

## 2021-11-09 DIAGNOSIS — J208 Acute bronchitis due to other specified organisms: Secondary | ICD-10-CM

## 2021-11-09 MED ORDER — PREDNISONE 10 MG (21) PO TBPK: ORAL_TABLET | ORAL | 0 refills | Status: DC

## 2021-11-09 MED ORDER — BENZONATATE 100 MG PO CAPS: 100.0000 mg | ORAL_CAPSULE | Freq: Three times a day (TID) | ORAL | 0 refills | Status: DC | PRN

## 2021-11-09 NOTE — Progress Notes (Signed)
We are sorry that you are not feeling well.  Here is how we plan to help! ? ?Based on your presentation I believe you most likely have A cough due to a virus.  This is called viral bronchitis and is best treated by rest, plenty of fluids and control of the cough.  You may use Ibuprofen or Tylenol as directed to help your symptoms.   ?  ?In addition you may use A non-prescription cough medication called Mucinex DM: take 2 tablets every 12 hours. and A prescription cough medication called Tessalon Perles 100mg. You may take 1-2 capsules every 8 hours as needed for your cough. ? ?Prednisone 10 mg daily for 6 days (see taper instructions below) ? ?Directions for 6 day taper: ?Day 1: 2 tablets before breakfast, 1 after both lunch & dinner and 2 at bedtime ?Day 2: 1 tab before breakfast, 1 after both lunch & dinner and 2 at bedtime ?Day 3: 1 tab at each meal & 1 at bedtime ?Day 4: 1 tab at breakfast, 1 at lunch, 1 at bedtime ?Day 5: 1 tab at breakfast & 1 tab at bedtime ?Day 6: 1 tab at breakfast ? ?From your responses in the eVisit questionnaire you describe inflammation in the upper respiratory tract which is causing a significant cough.  This is commonly called Bronchitis and has four common causes:   ?Allergies ?Viral Infections ?Acid Reflux ?Bacterial Infection ?Allergies, viruses and acid reflux are treated by controlling symptoms or eliminating the cause. An example might be a cough caused by taking certain blood pressure medications. You stop the cough by changing the medication. Another example might be a cough caused by acid reflux. Controlling the reflux helps control the cough. ? ?USE OF BRONCHODILATOR ("RESCUE") INHALERS: ?There is a risk from using your bronchodilator too frequently.  The risk is that over-reliance on a medication which only relaxes the muscles surrounding the breathing tubes can reduce the effectiveness of medications prescribed to reduce swelling and congestion of the tubes themselves.   Although you feel brief relief from the bronchodilator inhaler, your asthma may actually be worsening with the tubes becoming more swollen and filled with mucus.  This can delay other crucial treatments, such as oral steroid medications. If you need to use a bronchodilator inhaler daily, several times per day, you should discuss this with your provider.  There are probably better treatments that could be used to keep your asthma under control.  ?   ?HOME CARE ?Only take medications as instructed by your medical team. ?Complete the entire course of an antibiotic. ?Drink plenty of fluids and get plenty of rest. ?Avoid close contacts especially the very young and the elderly ?Cover your mouth if you cough or cough into your sleeve. ?Always remember to wash your hands ?A steam or ultrasonic humidifier can help congestion.  ? ?GET HELP RIGHT AWAY IF: ?You develop worsening fever. ?You become short of breath ?You cough up blood. ?Your symptoms persist after you have completed your treatment plan ?MAKE SURE YOU  ?Understand these instructions. ?Will watch your condition. ?Will get help right away if you are not doing well or get worse. ?  ? ?Thank you for choosing an e-visit. ? ?Your e-visit answers were reviewed by a board certified advanced clinical practitioner to complete your personal care plan. Depending upon the condition, your plan could have included both over the counter or prescription medications. ? ?Please review your pharmacy choice. Make sure the pharmacy is open so you can   pick up prescription now. If there is a problem, you may contact your provider through MyChart messaging and have the prescription routed to another pharmacy.  Your safety is important to us. If you have drug allergies check your prescription carefully.  ? ?For the next 24 hours you can use MyChart to ask questions about today's visit, request a non-urgent call back, or ask for a work or school excuse. ?You will get an email in the next  two days asking about your experience. I hope that your e-visit has been valuable and will speed your recovery. ? ?I provided 5 minutes of non face-to-face time during this encounter for chart review and documentation.  ? ?

## 2021-11-10 ENCOUNTER — Other Ambulatory Visit: Payer: Self-pay | Admitting: Family Medicine

## 2021-11-10 MED ORDER — DOXYCYCLINE HYCLATE 100 MG PO TABS: 100.0000 mg | ORAL_TABLET | Freq: Two times a day (BID) | ORAL | 0 refills | Status: DC

## 2021-11-10 NOTE — Telephone Encounter (Signed)
[facility] is requesting to fill pt vitamin D 50,000 ut. Please advise if this is a standing script or should pt come back for labs.  ? ?Elyse Jarvis RMA   ?

## 2021-11-10 NOTE — Addendum Note (Signed)
Addended by: Brunetta Jeans on: 11/10/2021 12:26 PM ? ? Modules accepted: Orders ? ?

## 2021-11-11 NOTE — Telephone Encounter (Signed)
Appt was made for Monday. [place] ?

## 2021-11-11 NOTE — Telephone Encounter (Signed)
Pt has not had her vitamin d checked in over 4 years and on a really high dose. Please advise when she should return for labs before script is refilled because has been taking . [place] ?

## 2021-11-13 ENCOUNTER — Encounter: Payer: Self-pay | Admitting: Family Medicine

## 2021-11-14 ENCOUNTER — Other Ambulatory Visit: Payer: BC Managed Care – PPO

## 2021-11-15 ENCOUNTER — Ambulatory Visit (INDEPENDENT_AMBULATORY_CARE_PROVIDER_SITE_OTHER): Payer: BC Managed Care – PPO | Admitting: Family Medicine

## 2021-11-15 ENCOUNTER — Encounter: Payer: Self-pay | Admitting: Family Medicine

## 2021-11-15 VITALS — BP 134/90 | HR 98 | Temp 97.7°F | Resp 20 | Wt 171.2 lb

## 2021-11-15 DIAGNOSIS — D1720 Benign lipomatous neoplasm of skin and subcutaneous tissue of unspecified limb: Secondary | ICD-10-CM

## 2021-11-15 DIAGNOSIS — E559 Vitamin D deficiency, unspecified: Secondary | ICD-10-CM

## 2021-11-15 DIAGNOSIS — R0789 Other chest pain: Secondary | ICD-10-CM | POA: Diagnosis not present
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
## 2021-11-16 LAB — VITAMIN D 25 HYDROXY (VIT D DEFICIENCY, FRACTURES): Vit D, 25-Hydroxy: 52.6 ng/mL (ref 30.0–100.0)

## 2021-12-19 ENCOUNTER — Encounter: Payer: Self-pay | Admitting: Family Medicine

## 2021-12-20 DIAGNOSIS — C8190 Hodgkin lymphoma, unspecified, unspecified site: Secondary | ICD-10-CM | POA: Diagnosis not present

## 2021-12-20 DIAGNOSIS — D649 Anemia, unspecified: Secondary | ICD-10-CM | POA: Diagnosis not present

## 2021-12-20 NOTE — Telephone Encounter (Signed)
Pt was called and she stated she did not know she should be taking vitamin d otc.

## 2021-12-22 DIAGNOSIS — N651 Disproportion of reconstructed breast: Secondary | ICD-10-CM | POA: Diagnosis not present

## 2021-12-22 DIAGNOSIS — Z428 Encounter for other plastic and reconstructive surgery following medical procedure or healed injury: Secondary | ICD-10-CM | POA: Diagnosis not present

## 2021-12-22 DIAGNOSIS — F419 Anxiety disorder, unspecified: Secondary | ICD-10-CM | POA: Diagnosis not present

## 2021-12-22 DIAGNOSIS — L905 Scar conditions and fibrosis of skin: Secondary | ICD-10-CM | POA: Diagnosis not present

## 2021-12-22 DIAGNOSIS — N6011 Diffuse cystic mastopathy of right breast: Secondary | ICD-10-CM | POA: Diagnosis not present

## 2021-12-22 DIAGNOSIS — Z45811 Encounter for adjustment or removal of right breast implant: Secondary | ICD-10-CM | POA: Diagnosis not present

## 2021-12-22 DIAGNOSIS — Z79899 Other long term (current) drug therapy: Secondary | ICD-10-CM | POA: Diagnosis not present

## 2021-12-22 DIAGNOSIS — N6012 Diffuse cystic mastopathy of left breast: Secondary | ICD-10-CM | POA: Diagnosis not present

## 2021-12-22 DIAGNOSIS — G8918 Other acute postprocedural pain: Secondary | ICD-10-CM | POA: Diagnosis not present

## 2021-12-22 DIAGNOSIS — Z9013 Acquired absence of bilateral breasts and nipples: Secondary | ICD-10-CM | POA: Diagnosis not present

## 2022-01-23 ENCOUNTER — Encounter: Payer: Self-pay | Admitting: Cardiology

## 2022-01-23 ENCOUNTER — Ambulatory Visit (INDEPENDENT_AMBULATORY_CARE_PROVIDER_SITE_OTHER): Payer: BC Managed Care – PPO | Admitting: Cardiology

## 2022-01-23 VITALS — BP 140/98 | HR 89 | Ht 65.5 in | Wt 175.0 lb

## 2022-01-23 DIAGNOSIS — I7300 Raynaud's syndrome without gangrene: Secondary | ICD-10-CM

## 2022-01-23 DIAGNOSIS — I10 Essential (primary) hypertension: Secondary | ICD-10-CM | POA: Diagnosis not present

## 2022-01-23 DIAGNOSIS — I341 Nonrheumatic mitral (valve) prolapse: Secondary | ICD-10-CM

## 2022-01-23 DIAGNOSIS — E785 Hyperlipidemia, unspecified: Secondary | ICD-10-CM | POA: Diagnosis not present

## 2022-01-23 DIAGNOSIS — Z01810 Encounter for preprocedural cardiovascular examination: Secondary | ICD-10-CM

## 2022-01-23 LAB — COMPREHENSIVE METABOLIC PANEL
ALT: 79 IU/L — ABNORMAL HIGH (ref 0–32)
AST: 116 IU/L — ABNORMAL HIGH (ref 0–40)
Albumin/Globulin Ratio: 1.9 (ref 1.2–2.2)
Albumin: 4.7 g/dL (ref 3.9–4.9)
Alkaline Phosphatase: 76 IU/L (ref 44–121)
BUN/Creatinine Ratio: 12 (ref 9–23)
BUN: 9 mg/dL (ref 6–24)
Bilirubin Total: 0.5 mg/dL (ref 0.0–1.2)
CO2: 23 mmol/L (ref 20–29)
Calcium: 9.9 mg/dL (ref 8.7–10.2)
Chloride: 98 mmol/L (ref 96–106)
Creatinine, Ser: 0.73 mg/dL (ref 0.57–1.00)
Globulin, Total: 2.5 g/dL (ref 1.5–4.5)
Glucose: 85 mg/dL (ref 70–99)
Potassium: 4.3 mmol/L (ref 3.5–5.2)
Sodium: 137 mmol/L (ref 134–144)
Total Protein: 7.2 g/dL (ref 6.0–8.5)
eGFR: 106 mL/min/{1.73_m2} (ref >59)

## 2022-01-23 LAB — LIPID PANEL
Chol/HDL Ratio: 2.4 ratio (ref 0.0–4.4)
Cholesterol, Total: 282 mg/dL — ABNORMAL HIGH (ref 100–199)
HDL: 117 mg/dL (ref >39)
LDL Chol Calc (NIH): 151 mg/dL — ABNORMAL HIGH (ref 0–99)
Triglycerides: 86 mg/dL (ref 0–149)
VLDL Cholesterol Cal: 14 mg/dL (ref 5–40)

## 2022-01-23 MED ORDER — ROSUVASTATIN CALCIUM 20 MG PO TABS: 20.0000 mg | ORAL_TABLET | Freq: Every day | ORAL | 3 refills | Status: DC

## 2022-01-23 MED ORDER — VALSARTAN 160 MG PO TABS: 160.0000 mg | ORAL_TABLET | Freq: Every day | ORAL | 3 refills | Status: DC

## 2022-01-23 NOTE — Patient Instructions (Signed)
Medication Instructions:   Restart taking Valsartan 160 mg daily - but for the first month take 1/2 tablet  ( 80 mg) daily then increase to 160 mg   Start taking Rosuvastatin 20 mg  at bedtime - but for the first one month take 1/2 tablet ( 10 mg) , then increase to 20 mg .  *If you need a refill on your cardiac medications before your next appointment, please call your pharmacy*  Other Instructions   Monitor blood pressures  - when office contact you in 3  to 4 months  about lab results  - you can give   about weeks worth of readings   Lab Work: Cmp,lipid - today   Then in 3 to 4 months fasting CMP ,LIPID  If you have labs (blood work) drawn today and your tests are completely normal, you will receive your results only by: MyChart Message (if you have [place]) OR A paper copy in the mail If you have any lab test that is abnormal or we need to change your treatment, we will call you to review the results.   Testing/Procedures: Not needed   Follow-Up: At [facility], you and your health needs are our priority.  As part of our continuing mission to provide you with exceptional heart care, we have created designated Provider Care Teams.  These Care Teams include your primary Cardiologist (physician) and Advanced Practice Providers (APPs -  Physician Assistants and Nurse Practitioners) who all work together to provide you with the care you need, when you need it.     Your next appointment:   12 month(s)  The format for your next appointment:   In Person  Provider:   Glenetta Hew, MD

## 2022-01-23 NOTE — Progress Notes (Signed)
Primary Care Provider: Denita Lung, MD Cardiologist: Glenetta Hew, MD Electrophysiologist: None  Clinic Note: Chief Complaint  Patient presents with   Follow-up    11 months.    ===================================  ASSESSMENT/PLAN   Problem List Items Addressed This Visit       Cardiology Problems   Essential hypertension (Chronic)    Blood pressure is high today, but she is said that somehow her valsartan was not refilled.  She was on 160 mg valsartan.  We will restart it today.  Continue Toprol 50 mg and restart valsartan 160 mg Recommended to keep a BP log. When she comes in for her follow-up labs in 3 to 4 months, can review BP log.  Follow-up BP evaluation with PCP.  She will keep a blood pressure log leading up to that visit.      Relevant Medications   valsartan (DIOVAN) 160 MG tablet   rosuvastatin (CRESTOR) 20 MG tablet   MVP (mitral valve prolapse) - Primary (Chronic)    Pretty much stable on echocardiogram.  Mild to moderate MR-probably more moderate.  Preserved LVEF.   She is doing well with no significant dyspnea or palpitations..  Seems stable overall, recheck in 2 years. Continue to manage blood pressure and monitor for symptoms.      Relevant Medications   valsartan (DIOVAN) 160 MG tablet   rosuvastatin (CRESTOR) 20 MG tablet   Other Relevant Orders   EKG 12-Lead (Completed)   Lipid panel   Comprehensive metabolic panel   Lipid panel (Completed)   Comprehensive metabolic panel (Completed)   Hyperlipidemia LDL goal <100 (Chronic)    Labs were just checked in April 2023, lipids poorly controlled with triglycerides 204, LDL 143 and TC of 264.  => She has started a new diet and try to increase her exercise level more.  She is hoping that she can avoid using medications.  After discussion, she agreed that she probably would not build to make the dramatic changes that would be required to get her LDL down further. Plan:  Recheck labs  today-chemistry panel and lipids. Start rosuvastatin 20 mg daily. => Recheck lipids in 3 to 4 months.  Her PCP is supposed be checking her labs back again in about 3 to 4 months. => Can also recheck her blood pressure at that time.       Relevant Medications   valsartan (DIOVAN) 160 MG tablet   rosuvastatin (CRESTOR) 20 MG tablet   Raynauds disease (Chronic)    We had switched her to verapamil last year, now she is only on Toprol.  No longer on calcium channel blocker.-Discontinue by somebody else.      Relevant Medications   valsartan (DIOVAN) 160 MG tablet   rosuvastatin (CRESTOR) 20 MG tablet     Other   Preoperative cardiovascular examination    Lisa Waters is doing well with no active cardiac symptoms.  Her echocardiogram is stable with moderate MR and mitral prolapse.  She is completely asymptomatic with no resting exertional dyspnea.  No angina and no arrhythmias.  She is able to achieve 8-10 METS without difficulty.  Nondiabetic with normal renal function.  No stroke history, CHF or CAD history.  She has had multiple breast surgeries and done well.  Potentially may require cervical spine surgery which is no more risk with cardiac standpoint then breast surgery would be.  As such, no additional cardiovascular evaluation required prior to her surgery.    Assessment: Low Risk Patient for Low  Risk Surgery. Recommendation: Okay to proceed with surgery with no further cardiac evaluation.      Other Visit Diagnoses     Hyperlipidemia with target LDL less than 130       Relevant Medications   valsartan (DIOVAN) 160 MG tablet   rosuvastatin (CRESTOR) 20 MG tablet   Other Relevant Orders   Lipid panel   Comprehensive metabolic panel   Lipid panel (Completed)   Comprehensive metabolic panel (Completed)      ===================================  HPI:    Lisa Waters is a 41 y.o. female with a PMH notable for Hodgkin's lymphoma (XRT), Moderate MR with MVP, HTN, HLD, and Breast  Cancer status post Bilateral Mastectomy and Multiple Revisions) who presents today for delayed annual follow-up.  Lisa Waters was last seen on Nov 29, 2020 as routine follow-up but also for preop cardiovascular evaluation for breast surgery/revision.  She was doing well.  No major cardiac issues.  => 2D echo ordered for this April.  Recent Hospitalizations:  Most recent breast reconstruction revision was June 8-due to asymmetry.  ([place])  Reviewed  CV studies:    The following studies were reviewed today: (if available, images/films reviewed: From Epic Chart or Care Everywhere) Echo 10/28/2021: EF 60 to 65%.  Normal LV size and function.  Normal diastolic parameters.  Normal RV size and function.  Normal aortic valve.  Normal RAP.  Myxomatous mitral valve with anterior leaflet prolapse, moderate MR-posteriorly directed eccentric MR jet:  Conclusion(s)/Recommendation(s): Compared to prior study 10/22/2019, MR appears improved.   Interval History:   Lisa Waters returns here today for cardiology evaluation doing well.  She is a little bit confused because she stopped getting her valsartan and and her blood pressures been running a little bit but she really has not noticed any issues.  She says that she simply stopped getting refills.  He probably had some of that with the fact that she was beyond 1 year from her last visit.  She had hopefully her final breast submission last month, and has upcoming neck surgery for C6-7 spinal surgery. She says that she is adjusted her diet eating a basic keto diet but her weight is up.  She also noted that her lipid panel was not so well controlled.  Thankfully, cardiac symptoms point she is totally asymptomatic.  She has try and exercise more but is somewhat deconditioned and has some mild exertional dyspnea, but nothing like she had when I first met her.  She says every now and then she feels her heart rate going up when she is anxious but is trying  to do biofeedback techniques to bring it down.  The metoprolol definitely helps.  CV Review of Symptoms (Summary) Cardiovascular ROS: no chest pain or dyspnea on exertion positive for - every now & then with anxiety has fast HR.  BP trending up -- Valsartan not refilled ? She isn't sure why negative for - edema, irregular heartbeat, orthopnea, paroxysmal nocturnal dyspnea, rapid heart rate, shortness of breath, or syncope/near syncope, TIA/amaurosis fugax, claudication  REVIEWED OF SYSTEMS   Review of Systems  Constitutional:  Negative for malaise/fatigue and weight loss.  HENT:  Negative for congestion and nosebleeds.   Respiratory:  Positive for wheezing (Only associated with allergies.).   Cardiovascular:        Per HPI  Gastrointestinal:  Negative for blood in stool and melena.  Genitourinary:  Negative for dysuria and hematuria.  Musculoskeletal:  Negative for falls, joint pain and myalgias.  Neurological:  Negative for dizziness, focal weakness, weakness and headaches.  Psychiatric/Behavioral:  Negative for depression (Well-controlled) and memory loss. The patient is nervous/anxious (Much better controlled.). The patient does not have insomnia.     I have reviewed and (if needed) personally updated the patient's problem list, medications, allergies, past medical and surgical history, social and family history.   PAST MEDICAL HISTORY   Past Medical History:  Diagnosis Date   Anxiety    Asthma    mild intermittent as of 07/2017 - just when patient gets sick   Depression    Female infertility    GERD (gastroesophageal reflux disease)    Hx - no current problems, no meds   H/O amaurosis fugax Fall 2012   Resolved, no current problems, Hx-MRI of brain/brain stem: no acute abnormality, small area of encephalomalacia left superior cerebellum that could be prior trauma, infection, or lacunar infarct   History of Hodgkin's lymphoma 2007   in remission, sees [facility], prior with Dr.  Elwanda [place]; prior chemotherapy and radiation therapy; sees yearly in December, no current problems   Hyperlipidemia 07/2017   diet control, no meds   Hypothyroidism    Moderate mitral regurgitation by prior echocardiogram 09/2010   10/2015: EF 55-60%. Normal WM. Normal D Fxn. Bilateral MVP w/ Mod MR (very eccentric). Normal LA, RV & RA. Normal PAP.   MVP (mitral valve prolapse) 09/2010   Dr. Ellyn Hack, [facility]; a) Echo 3/'12: Mod MR; EF 60-65%; b) TEE 5/'12: Nl LV Fxn, EF 60-65%, mild, holosystolic prolapse of the medial anterior leaflet. Mod MR . c) Echo 3/'15: Moderate, holosystolic prolapse of  medial. Mod MR d) TM Stress Echo (@ [place]) Nl Lv Fxn, Mild-Mod MR @ Res0 --> Mod MR at HR 181 bpm (7:22 min, 10.10 METS) w/p WMA   Oral ulcer    Palpitations 10/2012   holter monitor, Dr. Ellyn Hack; no arrhythmia, no current problems   PONV (postoperative nausea and vomiting)     PAST SURGICAL HISTORY   Past Surgical History:  Procedure Laterality Date   ANTERIOR CERVICAL DECOMP/DISCECTOMY FUSION N/A 09/09/2021   Procedure: C6-7 ANTERIOR CERVICAL DISCECTOMY FUSION, ALLOGRAFT, PLATE;  Surgeon: [name], MD;  Location: [place];  Service: Orthopedics;  Laterality: N/A;   [place] SURGERY  2002   COLONOSCOPY  07/2014   Dr. Collene Mares - normal   DILATATION & CURETTAGE/HYSTEROSCOPY WITH MYOSURE N/A 01/29/2018   Procedure: DILATATION & CURETTAGE/HYSTEROSCOPY WITH MYOSURE POLYPECTOMY;  Surgeon: Charyl Bigger, MD;  Location: [facility];  Service: Gynecology;  Laterality: N/A;   ESOPHAGOGASTRODUODENOSCOPY  04/11/2013   [facility] Dr. Collene Mares   FOOT SURGERY Bilateral    on toes   INSERTION CENTRAL VENOUS ACCESS DEVICE W/ SUBCUTANEOUS PORT  2006   LYMPH NODE BIOPSY  2006   under right arm   MASTECTOMY Bilateral 02/2020   double mastectomy. Has had 9 surgical procedures on breast since then.   NM MYOCAR PERF WALL MOTION  12/2010   persantine myoview - moderate  breast attenuation (fixed mid-distal anterior defect), EF 68%, low risk scan   RHINOPLASTY  2007   deviated septum   TEE WITHOUT CARDIOVERSION  11/2010   Nl LV Fxn, EF 60-65%, mild, holosystolic prolapse of the medial anterior leaflet. Mod MR .    TRANSTHORACIC ECHOCARDIOGRAM  3/'14; 3/'15   LVEF 60-65%, wall motion normal, LV function normal, moderate holosystolic MVP (medial the middle scallop of the posterior leaflet), moderate regurgitation (directed posteriorly),  no shunt -- stable over 2 years   TRANSTHORACIC ECHOCARDIOGRAM  11/03/2021   EF 60 to 65%.  Normal LV size and function.  Normal diastolic parameters.  Normal RV size and function.  Normal aortic valve.  Normal RAP.  Myxomatous mitral valve with anterior leaflet prolapse, moderate MR-posteriorly directed eccentric MR jet:   TRANSTHORACIC ECHOCARDIOGRAM  10/22/2019   EF 55 to 60%.  Mild LVH.  GRII DD.  Elevated LAP.  Mild LA dilation.  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  Normal aortic valve.  (Stable   WISDOM TOOTH EXTRACTION      Immunization History  Administered Date(s) Administered   Influenza Split 05/20/2012   Influenza,inj,Quad PF,6+ Mos 04/07/2013   PPD Test 03/04/2013   Tdap 04/01/2015    MEDICATIONS/ALLERGIES   Current Meds  Medication Sig   ALPRAZolam (XANAX) 0.25 MG tablet TAKE 1 TABLET BY MOUTH TWICE A DAY AS NEEDED FOR ANXIETY   benzonatate (TESSALON) 100 MG capsule Take 1 capsule (100 mg total) by mouth 3 (three) times daily as needed.   cyanocobalamin (,VITAMIN B-12,) 1000 MCG/ML injection Inject 1,000 mcg into the muscle every 30 (thirty) days.   doxycycline (VIBRA-TABS) 100 MG tablet Take 1 tablet (100 mg total) by mouth 2 (two) times daily.   ibuprofen (ADVIL) 800 MG tablet Take 800 mg by mouth 3 (three) times daily as needed (menstrual pain.).   metoprolol succinate (TOPROL-XL) 50 MG 24 hr tablet Take 1 tablet (50 mg total) by mouth daily. TAKE WITH OR IMMEDIATELY FOLLOWING A MEAL.    oxyCODONE-acetaminophen (PERCOCET) 5-325 MG tablet Take 1-2 tablets by mouth every 6 (six) hours as needed for severe pain.   predniSONE (STERAPRED UNI-PAK 21 TAB) 10 MG (21) TBPK tablet 6 day taper; take as directed on package instructions   sertraline (ZOLOFT) 100 MG tablet Take 1 tablet (100 mg total) by mouth daily.   SYNTHROID 88 MCG tablet Take 1 tablet (88 mcg total) by mouth daily.   SYRINGE-NEEDLE, DISP, 3 ML (SAFESNAP SYRINGE) 25G X 1" 3 ML MISC Use for Vitamin B12 injections per instructions       Vitamin D, Ergocalciferol, (DRISDOL) 1.25 MG (50000 UNIT) CAPS capsule TAKE 1 CAPSULE BY MOUTH ONE TIME PER WEEK (Patient taking differently: Take 50,000 Units by mouth every Friday.)    Allergies  Allergen Reactions   Clindamycin Nausea And Vomiting    Pt states she took this medication at home and started having heartburn followed by N/V.    Codeine Itching   Fish Allergy Itching    Mahi Mahi   Iohexol Hives   Pindolol     Avoid non selective beta blockers due to dyspnea   Iodinated Contrast Media Rash    Has received since rash developed without pre-meds without development of rash.    SOCIAL HISTORY/FAMILY HISTORY   Reviewed in Epic:  Pertinent findings:  Social History   Tobacco Use   Smoking status: Former    Packs/day: 1.00    Years: 10.00    Total pack years: 10.00    Types: Cigarettes    Quit date: 11/07/2009    Years since quitting: 12.2   Smokeless tobacco: Never  Vaping Use   Vaping Use: Never used  Substance Use Topics   Alcohol use: Yes    Alcohol/week: 1.0 - 2.0 standard drink of alcohol    Types: 1 - 2 Cans of beer per week    Comment: social Beer/Liquor   Drug use: No   Social History  Social History Narrative   Married, 0 children, has failed fertility treatment & has given up attempts at IVF as well as adoption. 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   Formerly was Office manager at [employer], exercise - active in the  business,  Architect.  07/2017    OBJCTIVE -PE, EKG, labs   Wt Readings from Last 3 Encounters:  01/23/22 175 lb (79.4 kg)  11/15/21 171 lb 3.2 oz (77.7 kg)  11/01/21 171 lb 6.4 oz (77.7 kg)    Physical Exam: BP (!) 140/98 (BP Location: Left Arm, Patient Position: Sitting, Cuff Size: Normal)   Pulse 89   Ht 5' 5.5" (1.664 m)   Wt 175 lb (79.4 kg)   BMI 28.68 kg/m  Physical Exam Vitals reviewed.  Constitutional:      General: She is not in acute distress.    Appearance: Normal appearance. She is normal weight. She is not ill-appearing or toxic-appearing.     Comments: Well-nourished, well-groomed.  Has gained weight since I first met her, but not significantly since last visit.  HENT:     Head: Normocephalic and atraumatic.  Neck:     Vascular: No carotid bruit.  Cardiovascular:     Rate and Rhythm: Normal rate and regular rhythm. No extrasystoles are present.    Chest Wall: PMI is not displaced.     Pulses: Normal pulses.     Heart sounds: S1 normal and S2 normal. A midsystolic click (Barely auscultated). Murmur heard.     Low-pitched blowing crescendo-decrescendo mid to late systolic murmur is present with a grade of 2/6 at the apex radiating to the axilla and back.     No friction rub.  Pulmonary:     Effort: Pulmonary effort is normal. No respiratory distress.     Breath sounds: Normal breath sounds. No wheezing, rhonchi or rales.  Musculoskeletal:        General: No swelling. Normal range of motion.     Cervical back: Normal range of motion and neck supple.  Skin:    General: Skin is warm and dry.     Comments: Suntanned  Neurological:     General: No focal deficit present.     Mental Status: She is alert and oriented to person, place, and time.     Gait: Gait normal.  Psychiatric:        Mood and Affect: Mood normal.        Behavior: Behavior normal.        Thought Content: Thought content normal.        Judgment: Judgment normal.     Comments: She is  actually in pretty good spirits today.     Adult ECG Report  Rate: 89 ;  Rhythm: normal sinus rhythm and Left atrial enlargement.  Otherwise normal axis, intervals and durations. ;   Narrative Interpretation: Stable  Recent Labs: Labs checked today. Lab Results  Component Value Date   CHOL 282 (H) 01/23/2022   HDL 117 01/23/2022   LDLCALC 151 (H) 01/23/2022   TRIG 86 01/23/2022   CHOLHDL 2.4 01/23/2022   Lab Results  Component Value Date   CREATININE 0.73 01/23/2022   BUN 9 01/23/2022   NA 137 01/23/2022   K 4.3 01/23/2022   CL 98 01/23/2022   CO2 23 01/23/2022      Latest Ref Rng & Units 09/06/2021    9:21 AM 05/30/2021   11:23 PM 02/26/2019    9:52 AM  CBC  WBC 4.0 - 10.5 K/uL 4.9  3.9  4.4   Hemoglobin 12.0 - 15.0 g/dL 12.1  11.7  13.4   Hematocrit 36.0 - 46.0 % 37.3  36.4  38.8   Platelets 150 - 400 K/uL 317  221  255     No results found for: "HGBA1C" Lab Results  Component Value Date   TSH 3.110 11/01/2021    ==================================================  I spent a total of 29 minutes with the patient spent in direct patient consultation.  Additional time spent with chart review  / charting (studies, outside notes, etc): 13 min Total Time: 44 min  Current medicines are reviewed at length with the patient today.  (+/- concerns) N/A-she indicated that her valsartan dropped off her list and she did not get refills.  Notice: This dictation was prepared with Dragon dictation along with smart phrase technology. Any transcriptional errors that result from this process are unintentional and may not be corrected upon review.  Studies Ordered:   Orders Placed This Encounter  Procedures   Lipid panel   Comprehensive metabolic panel   Lipid panel   Comprehensive metabolic panel   EKG [id number]   Meds ordered this encounter  Medications   valsartan (DIOVAN) 160 MG tablet    Sig: Take 1 tablet (160 mg total) by mouth daily.    Dispense:  90 tablet     Refill:  3   rosuvastatin (CRESTOR) 20 MG tablet    Sig: Take 1 tablet (20 mg total) by mouth daily.    Dispense:  90 tablet    Refill:  3    Patient Instructions / Medication Changes & Studies & Tests Ordered   Patient Instructions  Medication Instructions:   Restart taking Valsartan 160 mg daily - but for the first month take 1/2 tablet  ( 80 mg) daily then increase to 160 mg   Start taking Rosuvastatin 20 mg  at bedtime - but for the first one month take 1/2 tablet ( 10 mg) , then increase to 20 mg .  *If you need a refill on your cardiac medications before your next appointment, please call your pharmacy*  Other Instructions   Monitor blood pressures  - when office contact you in 3  to 4 months  about lab results  - you can give   about weeks worth of readings   Lab Work: Cmp,lipid - today   Then in 3 to 4 months fasting CMP ,LIPID  If you have labs (blood work) drawn today and your tests are completely normal, you will receive your results only by: MyChart Message (if you have MyChart) OR A paper copy in the mail If you have any lab test that is abnormal or we need to change your treatment, we will call you to review the results.   Testing/Procedures: Not needed   Follow-Up: At [facility], you and your health needs are our priority.  As part of our continuing mission to provide you with exceptional heart care, we have created designated Provider Care Teams.  These Care Teams include your primary Cardiologist (physician) and Advanced Practice Providers (APPs -  Physician Assistants and Nurse Practitioners) who all work together to provide you with the care you need, when you need it.     Your next appointment:   12 month(s)  The format for your next appointment:   In Person  Provider:   Glenetta Hew, MD        Glenetta Hew, M.D.,  M.S. Interventional Cardiologist   Pager # [phone] Phone # [phone] [address] [place],  [place] [address]   Thank you for choosing Heartcare at [facility]!!

## 2022-02-05 ENCOUNTER — Encounter: Payer: Self-pay | Admitting: Cardiology

## 2022-02-05 DIAGNOSIS — Z01810 Encounter for preprocedural cardiovascular examination: Secondary | ICD-10-CM | POA: Insufficient documentation

## 2022-02-05 NOTE — Assessment & Plan Note (Signed)
Lisa Waters is doing well with no active cardiac symptoms.  Her echocardiogram is stable with moderate MR and mitral prolapse.  She is completely asymptomatic with no resting exertional dyspnea.  No angina and no arrhythmias.  She is able to achieve 8-10 METS without difficulty.  Nondiabetic with normal renal function.  No stroke history, CHF or CAD history.  She has had multiple breast surgeries and done well.  Potentially may require cervical spine surgery which is no more risk with cardiac standpoint then breast surgery would be.  As such, no additional cardiovascular evaluation required prior to her surgery.    Assessment: Low Risk Patient for Low Risk Surgery. Recommendation: Okay to proceed with surgery with no further cardiac evaluation.

## 2022-02-05 NOTE — Assessment & Plan Note (Addendum)
Pretty much stable on echocardiogram.  Mild to moderate MR-probably more moderate.  Preserved LVEF.   She is doing well with no significant dyspnea or palpitations..  Seems stable overall, recheck in 2 years.  Continue to manage blood pressure and monitor for symptoms.

## 2022-02-05 NOTE — Assessment & Plan Note (Addendum)
Blood pressure is high today, but she is said that somehow her valsartan was not refilled.  She was on 160 mg valsartan.  We will restart it today.   Continue Toprol 50 mg and restart valsartan 160 mg  Recommended to keep a BP log.  When she comes in for her follow-up labs in 3 to 4 months, can review BP log.  Follow-up BP evaluation with PCP.  She will keep a blood pressure log leading up to that visit.

## 2022-02-05 NOTE — Assessment & Plan Note (Addendum)
Labs were just checked in April 2023, lipids poorly controlled with triglycerides 204, LDL 143 and TC of 264.  => She has started a new diet and try to increase her exercise level more.  She is hoping that she can avoid using medications.  After discussion, she agreed that she probably would not build to make the dramatic changes that would be required to get her LDL down further. Plan:   Recheck labs today-chemistry panel and lipids.  Start rosuvastatin 20 mg daily. => Recheck lipids in 3 to 4 months.  Her PCP is supposed be checking her labs back again in about 3 to 4 months. => Can also recheck her blood pressure at that time.

## 2022-02-05 NOTE — Assessment & Plan Note (Signed)
We had switched her to verapamil last year, now she is only on Toprol.  No longer on calcium channel blocker.-Discontinue by somebody else.

## 2022-02-13 ENCOUNTER — Encounter: Payer: Self-pay | Admitting: Cardiology

## 2022-03-21 ENCOUNTER — Other Ambulatory Visit: Payer: Self-pay | Admitting: Family Medicine

## 2022-03-21 NOTE — Telephone Encounter (Signed)
Refill request on Xanax last apt 11/15/21 next apt 11/06/22.

## 2022-03-22 ENCOUNTER — Encounter: Payer: Self-pay | Admitting: Internal Medicine

## 2022-03-28 DIAGNOSIS — Z9189 Other specified personal risk factors, not elsewhere classified: Secondary | ICD-10-CM | POA: Diagnosis not present

## 2022-03-28 DIAGNOSIS — Z923 Personal history of irradiation: Secondary | ICD-10-CM | POA: Diagnosis not present

## 2022-03-28 DIAGNOSIS — Z9013 Acquired absence of bilateral breasts and nipples: Secondary | ICD-10-CM | POA: Diagnosis not present

## 2022-03-28 DIAGNOSIS — Z9889 Other specified postprocedural states: Secondary | ICD-10-CM | POA: Diagnosis not present

## 2022-03-28 DIAGNOSIS — Z8571 Personal history of Hodgkin lymphoma: Secondary | ICD-10-CM | POA: Diagnosis not present

## 2022-03-28 DIAGNOSIS — N6312 Unspecified lump in the right breast, upper inner quadrant: Secondary | ICD-10-CM | POA: Diagnosis not present

## 2022-03-28 DIAGNOSIS — R222 Localized swelling, mass and lump, trunk: Secondary | ICD-10-CM | POA: Diagnosis not present

## 2022-03-31 ENCOUNTER — Encounter: Payer: Self-pay | Admitting: Cardiology

## 2022-04-05 ENCOUNTER — Telehealth: Payer: BC Managed Care – PPO | Admitting: Physician Assistant

## 2022-04-05 DIAGNOSIS — L237 Allergic contact dermatitis due to plants, except food: Secondary | ICD-10-CM | POA: Diagnosis not present

## 2022-04-05 MED ORDER — PREDNISONE 10 MG (21) PO TBPK: ORAL_TABLET | ORAL | 0 refills | Status: DC

## 2022-04-05 NOTE — Progress Notes (Signed)
E-Visit for [employer]  We are sorry that you are not feeing well.  Here is how we plan to help!  Based on what you have shared with me it looks like you have had an allergic reaction to the oily resin from a group of plants.  This resin is very sticky, so it easily attaches to your skin, clothing, tools equipment, and pet's fur.    This blistering rash is often called poison ivy rash although it can come from contact with the leaves, stems and roots of poison ivy, poison oak and poison sumac.  The oily resin contains urushiol (u-ROO-she-ol) that produces a skin rash on exposed skin.  The severity of the rash depends on the amount of urushiol that gets on your skin.  A section of skin with more urushiol on it may develop a rash sooner.  The rash usually develops 12-48 hours after exposure and can last two to three weeks.  Your skin must come in direct contact with the plant's oil to be affected.  Blister fluid doesn't spread the rash.  However, if you come into contact with a piece of clothing or pet fur that has urushiol on it, the rash may spread out.  You can also transfer the oil to other parts of your body with your fingers.  Often the rash looks like a straight line because of the way the plant brushes against your skin.  Most poison ivy treatments are usually limited to self-care methods. Small areas of the rash will typically go away on its own in two to three weeks.  Since your rash is limited, I am recommending that you follow these recommendations:  Make sure that the clothes you were wearing and any towels or sheets that may have come in contact with the oil (urushiol) are washed in detergent and hot water.  Apply Benadryl or Caladryl lotion to the rash.  You may apply these as often as needed to control the itching.  Cool baths also often help with itching.  You may also apply an over-the-counter corticosteroid cream for the first few days.  Take oral antihistamines, such as diphenhydramine  (Benadryl, others), which may also help you sleep better.  Soak in a cool-water bath containing an oatmeal-based bath product (Aveeno).  Place Cool, wet compresses on the affected area for 15 to 30 minutes several times a day.  Avoid a hot shower or bath as this may increase your itching.     I have developed the following plan to treat your condition  I am prescribing a 6 day prednisone taper.  Day 1: Take 2 tablets with breakfast, 1 tablet with lunch, 1 tablet with supper, and 2 tablets at bedtime Day 2: Take 2 tablets with breakfast, 1 tablet with lunch, 1 tablet with supper, and 1 tablet at bedtime Day 3: Take 1 tablet at breakfast, 1 tablet with lunch, 1 tablet with supper, and 1 tablet at bedtime Day 4: Take 1 tablet with breakfast, 1 tablet with supper and 1 tablet at bedtime Day 5: Take 1 tablet with breakfast and 1 tablet with supper or bedtime Day 6: Take 1 tablet with breakfast  What can you do to prevent this rash?  Avoid the plants.  Learn how to identify poison ivy, poison oak and poison sumac in all seasons.  When hiking or engaging in other activities that might expose you to these plants, try to stay on cleared pathways.  If camping, make sure you pitch your  tent in an area free of these plants.  Keep pets from running through wooded areas so that urushiol doesn't accidentally stick to their fur, which you may touch.  Remove or kill the plants.  In your yard, you can get rid of poison ivy by applying an herbicide or pulling it out of the ground, including the roots, while wearing heavy gloves.  Afterward remove the gloves and thoroughly wash them and your hands.  Don't burn poison ivy or related plants because the urushiol can be carried by smoke.  Wear protective clothing.  If needed, protect your skin by wearing socks, boots, pants, long sleeves and vinyl gloves.  Wash your skin right away.  Washing off the oil with soap and water within 30 minutes of exposure may reduce your  chances of getting a poison ivy rash.  Even washing after an hour or so can help reduce the severity of the rash.  If you walk through some poison ivy and then later touch your shoes, you may get some urushiol on your hands, which may then transfer to your face or body by touching or rubbing.  If the contaminated object isn't cleaned, the urushiol on it can still cause a skin reaction years later.    Be careful not to reuse towels after you have washed your skin.  Also carefully wash clothing in detergent and hot water to remove all traces of the oil.  Handle contaminated clothing carefully so you don't transfer the urushiol to yourself, furniture, rugs or appliances.  Remember that pets can carry the oil on their fur and paws.  If you think your pet may be contaminated with urushiol, put on some long rubber gloves and give your pet a bath.  Finally, be careful not to burn these plants as the smoke can contain traces of the oil.  Inhaling the smoke may result in difficulty breathing. If that occurred you should see a physician as soon as possible.  See your doctor right away if:  The reaction is severe or widespread You inhaled the smoke from burning poison ivy and are having difficulty breathing Your skin continues to swell The rash affects your eyes, mouth or genitals Blisters are oozing pus You develop a fever greater than 100 F (37.8 C) The rash doesn't get better within a few weeks.  If you scratch the poison ivy rash, bacteria under your fingernails may cause the skin to become infected.  See your doctor if pus starts oozing from the blisters.  Treatment generally includes antibiotics.  Poison ivy treatments are usually limited to self-care methods.  And the rash typically goes away on its own in two to three weeks.     If the rash is widespread or results in a large number of blisters, your doctor may prescribe an oral corticosteroid, such as prednisone.  If a bacterial infection has  developed at the rash site, your doctor may give you a prescription for an oral antibiotic.  MAKE SURE YOU  Understand these instructions. Will watch your condition. Will get help right away if you are not doing well or get worse.   Thank you for choosing an e-visit.  Your e-visit answers were reviewed by a board certified advanced clinical practitioner to complete your personal care plan. Depending upon the condition, your plan could have included both over the counter or prescription medications.  Please review your pharmacy choice. Make sure the pharmacy is open so you can pick up prescription now. If there  is a problem, you may contact your provider through [employer] and have the prescription routed to another pharmacy.  Your safety is important to [place]. If you have drug allergies check your prescription carefully.   For the next 24 hours you can use MyChart to ask questions about today's visit, request a non-urgent call back, or ask for a work or school excuse. You will get an email in the next two days asking about your experience. I hope that your e-visit has been valuable and will speed your recovery.   I provided 5 minutes of non face-to-face time during this encounter for chart review and documentation.

## 2022-04-17 ENCOUNTER — Telehealth: Payer: Self-pay | Admitting: Licensed Clinical Social Worker

## 2022-04-17 NOTE — Patient Outreach (Signed)
  Care Coordination   04/17/2022 Name: Lisa Waters MRN: [id number] DOB: 06-Aug-1980   Care Coordination Outreach Attempts:  An unsuccessful telephone outreach was attempted today to offer the patient information about available care coordination services as a benefit of their health plan.   Follow Up Plan:  Additional outreach attempts will be made to offer the patient care coordination information and services.   Encounter Outcome:  No Answer  Care Coordination Interventions Activated:  No   Care Coordination Interventions:  No, not indicated    Christa See, MSW, [email or web address] Phone [phone] 4:03 PM

## 2022-04-25 ENCOUNTER — Encounter: Payer: Self-pay | Admitting: Internal Medicine

## 2022-05-02 ENCOUNTER — Encounter: Payer: Self-pay | Admitting: Family Medicine

## 2022-05-08 ENCOUNTER — Encounter: Payer: Self-pay | Admitting: Internal Medicine

## 2022-05-10 ENCOUNTER — Ambulatory Visit: Payer: BC Managed Care – PPO | Admitting: Orthopaedic Surgery

## 2022-05-15 DIAGNOSIS — Z124 Encounter for screening for malignant neoplasm of cervix: Secondary | ICD-10-CM | POA: Diagnosis not present

## 2022-05-15 DIAGNOSIS — Z6829 Body mass index (BMI) 29.0-29.9, adult: Secondary | ICD-10-CM | POA: Diagnosis not present

## 2022-05-15 DIAGNOSIS — Z01411 Encounter for gynecological examination (general) (routine) with abnormal findings: Secondary | ICD-10-CM | POA: Diagnosis not present

## 2022-05-15 DIAGNOSIS — Z01419 Encounter for gynecological examination (general) (routine) without abnormal findings: Secondary | ICD-10-CM | POA: Diagnosis not present

## 2022-05-24 ENCOUNTER — Ambulatory Visit (INDEPENDENT_AMBULATORY_CARE_PROVIDER_SITE_OTHER): Payer: BC Managed Care – PPO

## 2022-05-24 ENCOUNTER — Encounter: Payer: Self-pay | Admitting: Orthopaedic Surgery

## 2022-05-24 ENCOUNTER — Ambulatory Visit (INDEPENDENT_AMBULATORY_CARE_PROVIDER_SITE_OTHER): Payer: BC Managed Care – PPO | Admitting: Orthopaedic Surgery

## 2022-05-24 VITALS — BP 137/89 | HR 97

## 2022-05-24 DIAGNOSIS — M545 Low back pain, unspecified: Secondary | ICD-10-CM

## 2022-05-24 DIAGNOSIS — M542 Cervicalgia: Secondary | ICD-10-CM | POA: Diagnosis not present

## 2022-05-24 NOTE — Progress Notes (Signed)
Office Visit Note   Patient: Lisa Waters           Date of Birth: 1980-07-30           MRN: [id number] Visit Date: 05/24/2022              Requested by: Denita Lung, MD [place],  [place] [address] PCP: Denita Lung, MD   Assessment & Plan: Visit Diagnoses:  1. Neck pain   2. Acute bilateral low back pain, unspecified whether sciatica present     Plan: No evidence of radiculopathy upper lower extremities.  X-ray cervical spine is well-healed.  Previous MRI showed some mild changes at C4-5 and C5-6.  This might possibly be contributing to her hand numbness on the left.  For lumbar symptoms become persistent rather than lasting just 30 seconds we can consider imaging studies.  She will call if she would like to work with physical therapy.  Follow-Up Instructions: Return in about 3 months (around 08/24/2022).   Orders:  Orders Placed This Encounter  Procedures   XR Cervical Spine 2 or 3 views   XR Lumbar Spine 2-3 Views   No orders of the defined types were placed in this encounter.     Procedures: No procedures performed   Clinical Data: No additional findings.   Subjective: Chief Complaint  Patient presents with   Neck - Pain   Lower Back - Pain    HPI 41 year old female returns with ongoing problems with back pain.  She states she is also had discomfort in her neck worse on the left than right and intermittent numbness and tingling in her left hand.  Patient states she feels similar to prior to her C6-7 fusion in February 2023.  Patient is use of BC powders which will help.  Pain in her low back she states it is episodic sharp last 30 seconds and then resolves.It is across the lumbosacral junction.  She states she has had it almost every day.  Review of Systems history of Hodgkin's disease in remission, hypertension lipidemia all other systems noncontributory to HPI.   Objective: Vital Signs: BP 137/89   Pulse 97   Physical  Exam Constitutional:      Appearance: She is well-developed.  HENT:     Head: Normocephalic.     Right Ear: External ear normal.     Left Ear: External ear normal. There is no impacted cerumen.  Eyes:     Pupils: Pupils are equal, round, and reactive to light.  Neck:     Thyroid: No thyromegaly.     Trachea: No tracheal deviation.  Cardiovascular:     Rate and Rhythm: Normal rate.  Pulmonary:     Effort: Pulmonary effort is normal.  Abdominal:     Palpations: Abdomen is soft.  Musculoskeletal:     Cervical back: No rigidity.  Skin:    General: Skin is warm and dry.  Neurological:     Mental Status: She is alert and oriented to person, place, and time.  Psychiatric:        Behavior: Behavior normal.     Ortho Exam well-healed cervical incision anteriorly.  No brachial plexus tenderness negative Spurling.  Reflexes are 2+ and symmetrical.  Negative Phalen's at the wrist.  Ulnar nerve at the elbow is normal.  Normal lower extremity reflexes negative straight leg raising 90 degrees.  No sciatic notch tenderness no trochanteric bursal tenderness negative FABER test.  Specialty Comments:  No  specialty comments available.  Imaging: XR Cervical Spine 2 or 3 views  Result Date: 05/25/2022 AP lateral cervical spine images are obtained and reviewed.  This shows fusion at C6-7 with allograft and plate.  No screw loosening and graft is incorporated.  No adjacent level changes. Impression: Previous cervical fusion C6-7.  XR Lumbar Spine 2-3 Views  Result Date: 05/25/2022 AP lateral lumbar images are obtained and reviewed.  This shows mild narrowing L5-S1 no listhesis.  Normal lordosis.  Pelvis and sacroiliac joints appear normal. Impression: Normal lumbar radiographs with mild narrowing L5-S1.    PMFS History: Patient Active Problem List   Diagnosis Date Noted   Preoperative cardiovascular examination 02/05/2022   History of cervical discectomy 11/01/2021   Cervical spinal stenosis  09/09/2021   Hyperlipidemia LDL goal <100 08/31/2020   Essential hypertension 08/30/2020   Vitamin D deficiency 11/12/2018   Acute non-recurrent maxillary sinusitis 12/07/2017   Vaccine counseling 07/26/2017   Routine general medical examination at a health care facility 05/30/2016   Other specified hypothyroidism 04/01/2015   Major depression, recurrent ([place]) 05/25/2013   Allergic rhinitis 01/20/2013   Female infertility of other specified origin 04/09/2012   Raynauds disease 06/29/2011   Hodgkin's disease in remission ([place]) 12/29/2010   MVP (mitral valve prolapse) 12/29/2010   Past Medical History:  Diagnosis Date   Anxiety    Asthma    mild intermittent as of 07/2017 - just when patient gets sick   Depression    Female infertility    GERD (gastroesophageal reflux disease)    Hx - no current problems, no meds   H/O amaurosis fugax Fall 2012   Resolved, no current problems, Hx-MRI of brain/brain stem: no acute abnormality, small area of encephalomalacia left superior cerebellum that could be prior trauma, infection, or lacunar infarct   History of Hodgkin's lymphoma 2007   in remission, sees [facility], prior with Dr. Elwanda [place]; prior chemotherapy and radiation therapy; sees yearly in December, no current problems   Hyperlipidemia 07/2017   diet control, no meds   Hypothyroidism    Moderate mitral regurgitation by prior echocardiogram 09/2010   10/2015: EF 55-60%. Normal WM. Normal D Fxn. Bilateral MVP w/ Mod MR (very eccentric). Normal LA, RV & RA. Normal PAP.   MVP (mitral valve prolapse) 09/2010   Dr. Ellyn Hack, [facility]; a) Echo 3/'12: Mod MR; EF 60-65%; b) TEE 5/'12: Nl LV Fxn, EF 60-65%, mild, holosystolic prolapse of the medial anterior leaflet. Mod MR . c) Echo 3/'15: Moderate, holosystolic prolapse of  medial. Mod MR d) TM Stress Echo (@ [facility]) Nl Lv Fxn, Mild-Mod MR @ Res0 --> Mod MR at HR 181 bpm (7:22 min, 10.10 METS) w/p WMA   Oral ulcer    Palpitations 10/2012   holter  monitor, Dr. Ellyn Hack; no arrhythmia, no current problems   PONV (postoperative nausea and vomiting)     Family History  Problem Relation Age of Onset   Skin cancer Father    Cancer Paternal Grandmother        metastasis   Alcohol abuse Mother    Emphysema Paternal Grandfather        was a smoker, heart problems   Diabetes Maternal Aunt    OCD Maternal Aunt    Bipolar disorder Maternal Aunt    Stroke Maternal Grandmother    Cancer Maternal Aunt        skin   Heart disease Neg Hx    Hypertension Neg Hx    Hyperlipidemia Neg Hx  Past Surgical History:  Procedure Laterality Date   ANTERIOR CERVICAL DECOMP/DISCECTOMY FUSION N/A 09/09/2021   Procedure: C6-7 ANTERIOR CERVICAL DISCECTOMY FUSION, ALLOGRAFT, PLATE;  Surgeon: [name], MD;  Location: [facility];  Service: Orthopedics;  Laterality: N/A;   [place] [facility]  2002   COLONOSCOPY  07/2014   Dr. Collene Mares - normal   DILATATION & CURETTAGE/HYSTEROSCOPY WITH MYOSURE N/A 01/29/2018   Procedure: DILATATION & CURETTAGE/HYSTEROSCOPY WITH MYOSURE POLYPECTOMY;  Surgeon: Charyl Bigger, MD;  Location: [facility];  Service: Gynecology;  Laterality: N/A;   ESOPHAGOGASTRODUODENOSCOPY  04/11/2013   [facility] Dr. Collene Mares   FOOT SURGERY Bilateral    on toes   INSERTION CENTRAL VENOUS ACCESS DEVICE W/ SUBCUTANEOUS PORT  2006   LYMPH NODE BIOPSY  2006   under right arm   MASTECTOMY Bilateral 02/2020   double mastectomy. Has had 9 surgical procedures on breast since then.   NM MYOCAR PERF WALL MOTION  12/2010   persantine myoview - moderate breast attenuation (fixed mid-distal anterior defect), EF 68%, low risk scan   RHINOPLASTY  2007   deviated septum   TEE WITHOUT CARDIOVERSION  11/2010   Nl LV Fxn, EF 60-65%, mild, holosystolic prolapse of the medial anterior leaflet. Mod MR .    TRANSTHORACIC ECHOCARDIOGRAM  3/'14; 3/'15   LVEF 60-65%, wall motion normal, LV function normal, moderate  holosystolic MVP (medial the middle scallop of the posterior leaflet), moderate regurgitation (directed posteriorly), no shunt -- stable over 2 years   TRANSTHORACIC ECHOCARDIOGRAM  11/03/2021   EF 60 to 65%.  Normal LV size and function.  Normal diastolic parameters.  Normal RV size and function.  Normal aortic valve.  Normal RAP.  Myxomatous mitral valve with anterior leaflet prolapse, moderate MR-posteriorly directed eccentric MR jet:   TRANSTHORACIC ECHOCARDIOGRAM  10/22/2019   EF 55 to 60%.  Mild LVH.  GRII DD.  Elevated LAP.  Mild LA dilation.  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  Normal aortic valve.  (Stable   WISDOM TOOTH EXTRACTION     Social History   Occupational History   Occupation: works in Engineer, production: [employer]  Tobacco Use   Smoking status: Former    Packs/day: 1.00    Years: 10.00    Total pack years: 10.00    Types: Cigarettes    Quit date: 11/07/2009    Years since quitting: 12.5   Smokeless tobacco: Never  Vaping Use   Vaping Use: Never used  Substance and Sexual Activity   Alcohol use: Yes    Alcohol/week: 1.0 - 2.0 standard drink of alcohol    Types: 1 - 2 Cans of beer per week    Comment: social Beer/Liquor   Drug use: No   Sexual activity: Yes    Partners: Male    Birth control/protection: None

## 2022-06-13 ENCOUNTER — Encounter: Payer: Self-pay | Admitting: Orthopaedic Surgery

## 2022-06-13 ENCOUNTER — Telehealth: Payer: Self-pay | Admitting: Orthopaedic Surgery

## 2022-06-13 ENCOUNTER — Ambulatory Visit (INDEPENDENT_AMBULATORY_CARE_PROVIDER_SITE_OTHER): Payer: BC Managed Care – PPO | Admitting: Orthopaedic Surgery

## 2022-06-13 VITALS — BP 114/76 | HR 84 | Ht 65.5 in | Wt 175.0 lb

## 2022-06-13 DIAGNOSIS — R200 Anesthesia of skin: Secondary | ICD-10-CM

## 2022-06-13 DIAGNOSIS — M542 Cervicalgia: Secondary | ICD-10-CM

## 2022-06-13 NOTE — Telephone Encounter (Signed)
I recently saw Dr Lorin Mercy regarding back issues. I was to follow up in a few months however the problems persist and are getting worse everyday. I would like to know what the next steps would be.  Thank you.

## 2022-06-13 NOTE — Progress Notes (Unsigned)
Office Visit Note   Patient: Lisa Waters           Date of Birth: 1981/06/11           MRN: [id number] Visit Date: 06/13/2022              Requested by: Denita Lung, MD [place],  [place] [id number] PCP: Denita Lung, MD   Assessment & Plan: Visit Diagnoses: No diagnosis found.  Plan: ***  Follow-Up Instructions: No follow-ups on file.   Orders:  No orders of the defined types were placed in this encounter.  No orders of the defined types were placed in this encounter.     Procedures: No procedures performed   Clinical Data: No additional findings.   Subjective: Chief Complaint  Patient presents with   Neck - Pain   Lower Back - Pain    HPI  Review of Systems   Objective: Vital Signs: BP 114/76   Pulse 84   Ht 5' 5.5" (1.664 m)   Wt 175 lb (79.4 kg)   BMI 28.68 kg/m   Physical Exam  Ortho Exam  Specialty Comments:  No specialty comments available.  Imaging: No results found.   PMFS History: Patient Active Problem List   Diagnosis Date Noted   Preoperative cardiovascular examination 02/05/2022   History of cervical discectomy 11/01/2021   Cervical spinal stenosis 09/09/2021   Hyperlipidemia LDL goal <100 08/31/2020   Essential hypertension 08/30/2020   Vitamin D deficiency 11/12/2018   Acute non-recurrent maxillary sinusitis 12/07/2017   Vaccine counseling 07/26/2017   Routine general medical examination at a health care facility 05/30/2016   Other specified hypothyroidism 04/01/2015   Major depression, recurrent ([place]) 05/25/2013   Allergic rhinitis 01/20/2013   Female infertility of other specified origin 04/09/2012   Raynauds disease 06/29/2011   Hodgkin's disease in remission ([place]) 12/29/2010   MVP (mitral valve prolapse) 12/29/2010   Past Medical History:  Diagnosis Date   Anxiety    Asthma    mild intermittent as of 07/2017 - just when patient gets sick   Depression    Female infertility    GERD  (gastroesophageal reflux disease)    Hx - no current problems, no meds   H/O amaurosis fugax Fall 2012   Resolved, no current problems, Hx-MRI of brain/brain stem: no acute abnormality, small area of encephalomalacia left superior cerebellum that could be prior trauma, infection, or lacunar infarct   History of Hodgkin's lymphoma 2007   in remission, sees [facility], prior with Dr. Elwanda Brooklyn; prior chemotherapy and radiation therapy; sees yearly in December, no current problems   Hyperlipidemia 07/2017   diet control, no meds   Hypothyroidism    Moderate mitral regurgitation by prior echocardiogram 09/2010   10/2015: EF 55-60%. Normal WM. Normal D Fxn. Bilateral MVP w/ Mod MR (very eccentric). Normal LA, RV & RA. Normal PAP.   MVP (mitral valve prolapse) 09/2010   Dr. Ellyn Hack, [facility]; a) Echo 3/'12: Mod MR; EF 60-65%; b) TEE 5/'12: Nl LV Fxn, EF 60-65%, mild, holosystolic prolapse of the medial anterior leaflet. Mod MR . c) Echo 3/'15: Moderate, holosystolic prolapse of  medial. Mod MR d) TM Stress Echo (@ [place]) Nl Lv Fxn, Mild-Mod MR @ Res0 --> Mod MR at HR 181 bpm (7:22 min, 10.10 METS) w/p WMA   Oral ulcer    Palpitations 10/2012   holter monitor, Dr. Ellyn Hack; no arrhythmia, no current problems   PONV (postoperative nausea and vomiting)  Family History  Problem Relation Age of Onset   Skin cancer Father    Cancer Paternal Grandmother        metastasis   Alcohol abuse Mother    Emphysema Paternal Jon Gills        was a smoker, heart problems   Diabetes Maternal Aunt    OCD Maternal Aunt    Bipolar disorder Maternal Aunt    Stroke Maternal Grandmother    Cancer Maternal Aunt        skin   Heart disease Neg Hx    Hypertension Neg Hx    Hyperlipidemia Neg Hx     Past Surgical History:  Procedure Laterality Date   ANTERIOR CERVICAL DECOMP/DISCECTOMY FUSION N/A 09/09/2021   Procedure: C6-7 ANTERIOR CERVICAL DISCECTOMY FUSION, ALLOGRAFT, PLATE;  Surgeon: [name], MD;   Location: [place];  Service: Orthopedics;  Laterality: N/A;   [place] SURGERY  2002   COLONOSCOPY  07/2014   Dr. Collene Mares - normal   DILATATION & CURETTAGE/HYSTEROSCOPY WITH MYOSURE N/A 01/29/2018   Procedure: DILATATION & CURETTAGE/HYSTEROSCOPY WITH MYOSURE POLYPECTOMY;  Surgeon: Charyl Bigger, MD;  Location: [facility];  Service: Gynecology;  Laterality: N/A;   ESOPHAGOGASTRODUODENOSCOPY  04/11/2013   [facility] Dr. Collene Mares   FOOT SURGERY Bilateral    on toes   INSERTION CENTRAL VENOUS ACCESS DEVICE W/ SUBCUTANEOUS PORT  2006   LYMPH NODE BIOPSY  2006   under right arm   MASTECTOMY Bilateral 02/2020   double mastectomy. Has had 9 surgical procedures on breast since then.   NM MYOCAR PERF WALL MOTION  12/2010   persantine myoview - moderate breast attenuation (fixed mid-distal anterior defect), EF 68%, low risk scan   RHINOPLASTY  2007   deviated septum   TEE WITHOUT CARDIOVERSION  11/2010   Nl LV Fxn, EF 60-65%, mild, holosystolic prolapse of the medial anterior leaflet. Mod MR .    TRANSTHORACIC ECHOCARDIOGRAM  3/'14; 3/'15   LVEF 60-65%, wall motion normal, LV function normal, moderate holosystolic MVP (medial the middle scallop of the posterior leaflet), moderate regurgitation (directed posteriorly), no shunt -- stable over 2 years   TRANSTHORACIC ECHOCARDIOGRAM  11/03/2021   EF 60 to 65%.  Normal LV size and function.  Normal diastolic parameters.  Normal RV size and function.  Normal aortic valve.  Normal RAP.  Myxomatous mitral valve with anterior leaflet prolapse, moderate MR-posteriorly directed eccentric MR jet:   TRANSTHORACIC ECHOCARDIOGRAM  10/22/2019   EF 55 to 60%.  Mild LVH.  GRII DD.  Elevated LAP.  Mild LA dilation.  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  Normal aortic valve.  (Stable   WISDOM TOOTH EXTRACTION     Social History   Occupational History   Occupation: works in Engineer, production: [employer]  Tobacco Use   Smoking  status: Former    Packs/day: 1.00    Years: 10.00    Total pack years: 10.00    Types: Cigarettes    Quit date: 11/07/2009    Years since quitting: 12.6   Smokeless tobacco: Never  Vaping Use   Vaping Use: Never used  Substance and Sexual Activity   Alcohol use: Yes    Alcohol/week: 1.0 - 2.0 standard drink of alcohol    Types: 1 - 2 Cans of beer per week    Comment: social Beer/Liquor   Drug use: No   Sexual activity: Yes    Partners: Male  Birth control/protection: None

## 2022-06-13 NOTE — Telephone Encounter (Signed)
Did you discuss this at appointment today?

## 2022-06-14 DIAGNOSIS — R200 Anesthesia of skin: Secondary | ICD-10-CM | POA: Insufficient documentation

## 2022-06-14 NOTE — Telephone Encounter (Signed)
I called patient and advised. She has also been having lower back issues. She will have the EMG/NCV done first and then discuss the low back at follow up if she continues to have problems.

## 2022-06-23 ENCOUNTER — Ambulatory Visit: Payer: BC Managed Care – PPO | Admitting: Physical Medicine and Rehabilitation

## 2022-06-23 DIAGNOSIS — R202 Paresthesia of skin: Secondary | ICD-10-CM | POA: Diagnosis not present

## 2022-06-23 NOTE — Progress Notes (Signed)
Numeric Pain Rating Scale and Functional Assessment Average Pain 5   In the last MONTH (on 0-10 scale) has pain interfered with the following?  1. General activity like being  able to carry out your everyday physical activities such as walking, climbing stairs, carrying groceries, or moving a chair?  Rating(0)   Right handed. Has neck pain that radiates down left shoulder into left hand. Has tingling in last 2 fingers on each hand

## 2022-06-26 NOTE — Procedures (Signed)
EMG & NCV Findings: All nerve conduction studies (as indicated in the following tables) were within normal limits.  All left vs. right side differences were within normal limits.    All examined muscles (as indicated in the following table) showed no evidence of electrical instability.    Impression: Essentially NORMAL electrodiagnostic study of both upper limbs.  There is no significant electrodiagnostic evidence of nerve entrapment, brachial plexopathy or cervical radiculopathy.    As you know, purely sensory or demyelinating radiculopathies and chemical radiculitis may not be detected with this particular electrodiagnostic study. **This electrodiagnostic study cannot rule out small fiber polyneuropathy and dysesthesias from central pain syndromes such as stroke or central pain sensitization syndromes such as fibromyalgia.  Myotomal referral pain from trigger points is also not excluded.  Recommendations: 1.  Follow-up with referring physician. 2.  Continue current management of symptoms. 3.  Suggest MRI and surgical evaluation.  ___________________________ [name] FAAPMR Board Certified, American Board of Physical Medicine and Rehabilitation    Nerve Conduction Studies Anti Sensory Summary Table   Stim Site NR Peak (ms) Norm Peak (ms) P-T Amp (V) Norm P-T Amp Site1 Site2 Delta-P (ms) Dist (cm) Vel (m/s) Norm Vel (m/s)  Left Median Acr Palm Anti Sensory (2nd Digit)  29.4C  Wrist    2.9 <3.6 36.2 >10 Wrist Palm 1.3 0.0    Palm    1.6 <2.0 42.3         Right Median Acr Palm Anti Sensory (2nd Digit)  30C  Wrist    3.2 <3.6 31.8 >10 Wrist Palm 1.4 0.0    Palm    1.8 <2.0 38.8         Left Radial Anti Sensory (Base 1st Digit)  29.3C  Wrist    2.2 <3.1 39.6  Wrist Base 1st Digit 2.2 0.0    Right Radial Anti Sensory (Base 1st Digit)  29.6C  Wrist    2.2 <3.1 35.8  Wrist Base 1st Digit 2.2 0.0    Left Ulnar Anti Sensory (5th Digit)  30.2C  Wrist    3.4 <3.7 17.2 >15.0 Wrist  5th Digit 3.4 14.0 41 >38  Right Ulnar Anti Sensory (5th Digit)  29.9C  Wrist    3.3 <3.7 27.8 >15.0 Wrist 5th Digit 3.3 14.0 42 >38   Motor Summary Table   Stim Site NR Onset (ms) Norm Onset (ms) O-P Amp (mV) Norm O-P Amp Site1 Site2 Delta-0 (ms) Dist (cm) Vel (m/s) Norm Vel (m/s)  Left Median Motor (Abd Poll Brev)  29.7C  Wrist    2.7 <4.2 10.9 >5 Elbow Wrist 3.9 22.0 56 >50  Elbow    6.6  8.2         Right Median Motor (Abd Poll Brev)  29.6C  Wrist    3.0 <4.2 8.5 >5 Elbow Wrist 3.8 20.5 54 >50  Elbow    6.8  8.5         Left Ulnar Motor (Abd Dig Min)  29.7C  Wrist    3.0 <4.2 9.7 >3 B Elbow Wrist 2.9 18.0 62 >53  B Elbow    5.9  9.9  A Elbow B Elbow 1.1 11.0 100 >53  A Elbow    7.0  9.7         Right Ulnar Motor (Abd Dig Min)  29.4C  Wrist    2.9 <4.2 10.1 >3 B Elbow Wrist 3.2 18.5 58 >53  B Elbow    6.1  9.5  A Elbow  B Elbow 1.3 11.0 85 >53  A Elbow    7.4  9.7          EMG   Side Muscle Nerve Root Ins Act Fibs Psw Amp Dur Poly Recrt Int Fraser Din Comment  Left Abd Poll Brev Median C8-T1 Nml Nml Nml Nml Nml 0 Nml Nml   Left 1stDorInt Ulnar C8-T1 Nml Nml Nml Nml Nml 0 Nml Nml   Left PronatorTeres Median C6-7 Nml Nml Nml Nml Nml 0 Nml Nml   Left Biceps Musculocut C5-6 Nml Nml Nml Nml Nml 0 Nml Nml   Left Deltoid Axillary C5-6 Nml Nml Nml Nml Nml 0 Nml Nml   Right 1stDorInt Ulnar C8-T1 Nml Nml Nml Nml Nml 0 Nml Nml   Right Abd Poll Brev Median C8-T1 Nml Nml Nml Nml Nml 0 Nml Nml   Right ExtDigCom   Nml Nml Nml Nml Nml 0 Nml Nml   Right Triceps Radial C6-7-8 Nml Nml Nml Nml Nml 0 Nml Nml   Right Deltoid Axillary C5-6 Nml Nml Nml Nml Nml 0 Nml Nml     Nerve Conduction Studies Anti Sensory Left/Right Comparison   Stim Site L Lat (ms) R Lat (ms) L-R Lat (ms) L Amp (V) R Amp (V) L-R Amp (%) Site1 Site2 L Vel (m/s) R Vel (m/s) L-R Vel (m/s)  Median Acr Palm Anti Sensory (2nd Digit)  29.4C  Wrist 2.9 3.2 0.3 36.2 31.8 12.2 Wrist Palm     Palm 1.6 1.8 0.2 42.3 38.8 8.3        Radial Anti Sensory (Base 1st Digit)  29.3C  Wrist 2.2 2.2 0.0 39.6 35.8 9.6 Wrist Base 1st Digit     Ulnar Anti Sensory (5th Digit)  30.2C  Wrist 3.4 3.3 0.1 17.2 27.8 38.1 Wrist 5th Digit 41 42 1   Motor Left/Right Comparison   Stim Site L Lat (ms) R Lat (ms) L-R Lat (ms) L Amp (mV) R Amp (mV) L-R Amp (%) Site1 Site2 L Vel (m/s) R Vel (m/s) L-R Vel (m/s)  Median Motor (Abd Poll Brev)  29.7C  Wrist 2.7 3.0 0.3 10.9 8.5 22.0 Elbow Wrist 56 54 2  Elbow 6.6 6.8 0.2 8.2 8.5 3.5       Ulnar Motor (Abd Dig Min)  29.7C  Wrist 3.0 2.9 0.1 9.7 10.1 4.0 B Elbow Wrist 62 58 4  B Elbow 5.9 6.1 0.2 9.9 9.5 4.0 A Elbow B Elbow 100 85 15  A Elbow 7.0 7.4 0.4 9.7 9.7 0.0          Waveforms:

## 2022-06-28 ENCOUNTER — Encounter: Payer: Self-pay | Admitting: Orthopaedic Surgery

## 2022-06-28 ENCOUNTER — Ambulatory Visit (INDEPENDENT_AMBULATORY_CARE_PROVIDER_SITE_OTHER): Payer: BC Managed Care – PPO | Admitting: Orthopaedic Surgery

## 2022-06-28 VITALS — BP 123/85 | HR 95 | Ht 65.0 in | Wt 175.0 lb

## 2022-06-28 DIAGNOSIS — Z981 Arthrodesis status: Secondary | ICD-10-CM

## 2022-06-28 NOTE — Addendum Note (Signed)
Addended by: Robyne Peers on: 06/28/2022 10:24 AM   Modules accepted: Orders

## 2022-06-28 NOTE — Progress Notes (Signed)
Office Visit Note   Patient: Lisa Waters           Date of Birth: Jun 02, 1981           MRN: [id number] Visit Date: 06/28/2022              Requested by: Denita Lung, MD [place],   [id number] PCP: Denita Lung, MD   Assessment & Plan: Visit Diagnoses:  1. History of fusion of cervical spine     Plan: With patient's ongoing pain we will set up for some physical therapy for treatment of her neck.  We reviewed her electrical test.  She has not had reimaging since her cervical MRI 07/19/2021.  Plain radiograph shows solid fusion no disc base narrowing above the fusion.  Recheck 8 weeks.  If she is having persistent symptoms we will need to consider cervical MRI.  Follow-Up Instructions: No follow-ups on file.   Orders:  No orders of the defined types were placed in this encounter.  No orders of the defined types were placed in this encounter.     Procedures: No procedures performed   Clinical Data: No additional findings.   Subjective: Chief Complaint  Patient presents with   Neck - Pain, Follow-up    HPI 41 year old female returns she had EMG nerve conduction velocities upper extremities which are entirely normal.  She has had continued pain in her shoulder she states her head feels like it is very heavy it wakes her up multiple times during the day and she has pain and numbness that radiates down in her ulnar distribution.  This bothers her both the right and the left.  Previous cervical fusion 08/20/2021 solid at C6-7.  She did have some disc protrusion at C4-5 and C5-6 not as severe.  Review of Systems all systems noncontributory to HPI.  She does have Hodgkin's disease diagnosed 1975 in remission.   Objective: Vital Signs: BP 123/85   Pulse 95   Ht '5\' 5"'$  (1.651 m)   Wt 175 lb (79.4 kg)   BMI 29.12 kg/m   Physical Exam Constitutional:      Appearance: She is well-developed.  HENT:     Head: Normocephalic.     Right Ear:  External ear normal.     Left Ear: External ear normal. There is no impacted cerumen.  Eyes:     Pupils: Pupils are equal, round, and reactive to light.  Neck:     Thyroid: No thyromegaly.     Trachea: No tracheal deviation.  Cardiovascular:     Rate and Rhythm: Normal rate.  Pulmonary:     Effort: Pulmonary effort is normal.  Abdominal:     Palpations: Abdomen is soft.  Musculoskeletal:     Cervical back: No rigidity.  Skin:    General: Skin is warm and dry.  Neurological:     Mental Status: She is alert and oriented to person, place, and time.  Psychiatric:        Behavior: Behavior normal.     Ortho Exam intact reflexes minimal brachial plexus tenderness no relief with cervical distraction no increased pain with cervical compression.  Negative Spurling.  Negative Lhermitte.  No shoulder or wrist impingement full active shoulder range of motion.  Specialty Comments:  No specialty comments available.  Imaging: No results found.   PMFS History: Patient Active Problem List   Diagnosis Date Noted   History of fusion of cervical spine 06/28/2022   Bilateral hand  numbness 06/14/2022   Preoperative cardiovascular examination 02/05/2022   History of cervical discectomy 11/01/2021   Cervical spinal stenosis 09/09/2021   Hyperlipidemia LDL goal <100 08/31/2020   Essential hypertension 08/30/2020   Vitamin D deficiency 11/12/2018   Acute non-recurrent maxillary sinusitis 12/07/2017   Vaccine counseling 07/26/2017   Routine general medical examination at a health care facility 05/30/2016   Other specified hypothyroidism 04/01/2015   Major depression, recurrent ([place]) 05/25/2013   Allergic rhinitis 01/20/2013   Female infertility of other specified origin 04/09/2012   Raynauds disease 06/29/2011   Hodgkin's disease in remission ([place]) 12/29/2010   MVP (mitral valve prolapse) 12/29/2010   Past Medical History:  Diagnosis Date   Anxiety    Asthma    mild intermittent as of  07/2017 - just when patient gets sick   Depression    Female infertility    GERD (gastroesophageal reflux disease)    Hx - no current problems, no meds   H/O amaurosis fugax Fall 2012   Resolved, no current problems, Hx-MRI of brain/brain stem: no acute abnormality, small area of encephalomalacia left superior cerebellum that could be prior trauma, infection, or lacunar infarct   History of Hodgkin's lymphoma 2007   in remission, sees [facility], prior with Dr. Elwanda Brooklyn; prior chemotherapy and radiation therapy; sees yearly in December, no current problems   Hyperlipidemia 07/2017   diet control, no meds   Hypothyroidism    Moderate mitral regurgitation by prior echocardiogram 09/2010   10/2015: EF 55-60%. Normal WM. Normal D Fxn. Bilateral MVP w/ Mod MR (very eccentric). Normal LA, RV & RA. Normal PAP.   MVP (mitral valve prolapse) 09/2010   Dr. Ellyn Hack, [facility]; a) Echo 3/'12: Mod MR; EF 60-65%; b) TEE 5/'12: Nl LV Fxn, EF 60-65%, mild, holosystolic prolapse of the medial anterior leaflet. Mod MR . c) Echo 3/'15: Moderate, holosystolic prolapse of  medial. Mod MR d) TM Stress Echo (@ [place]) Nl Lv Fxn, Mild-Mod MR @ Res0 --> Mod MR at HR 181 bpm (7:22 min, 10.10 METS) w/p WMA   Oral ulcer    Palpitations 10/2012   holter monitor, Dr. Ellyn Hack; no arrhythmia, no current problems   PONV (postoperative nausea and vomiting)     Family History  Problem Relation Age of Onset   Skin cancer Father    Cancer Paternal Grandmother        metastasis   Alcohol abuse Mother    Emphysema Paternal Grandfather        was a smoker, heart problems   Diabetes Maternal Aunt    OCD Maternal Aunt    Bipolar disorder Maternal Aunt    Stroke Maternal Grandmother    Cancer Maternal Aunt        skin   Heart disease Neg Hx    Hypertension Neg Hx    Hyperlipidemia Neg Hx     Past Surgical History:  Procedure Laterality Date   ANTERIOR CERVICAL DECOMP/DISCECTOMY FUSION N/A 09/09/2021   Procedure: C6-7 ANTERIOR  CERVICAL DISCECTOMY FUSION, ALLOGRAFT, PLATE;  Surgeon: [name], MD;  Location: [place];  Service: Orthopedics;  Laterality: N/A;   [place] SURGERY  2002   COLONOSCOPY  07/2014   Dr. Collene Mares - normal   DILATATION & CURETTAGE/HYSTEROSCOPY WITH MYOSURE N/A 01/29/2018   Procedure: DILATATION & CURETTAGE/HYSTEROSCOPY WITH MYOSURE POLYPECTOMY;  Surgeon: Charyl Bigger, MD;  Location: [facility];  Service: Gynecology;  Laterality: N/A;   ESOPHAGOGASTRODUODENOSCOPY  04/11/2013  [facility] Dr. Collene Mares   FOOT SURGERY Bilateral    on toes   INSERTION CENTRAL VENOUS ACCESS DEVICE W/ SUBCUTANEOUS PORT  2006   LYMPH NODE BIOPSY  2006   under right arm   MASTECTOMY Bilateral 02/2020   double mastectomy. Has had 9 surgical procedures on breast since then.   NM MYOCAR PERF WALL MOTION  12/2010   persantine myoview - moderate breast attenuation (fixed mid-distal anterior defect), EF 68%, low risk scan   RHINOPLASTY  2007   deviated septum   TEE WITHOUT CARDIOVERSION  11/2010   Nl LV Fxn, EF 60-65%, mild, holosystolic prolapse of the medial anterior leaflet. Mod MR .    TRANSTHORACIC ECHOCARDIOGRAM  3/'14; 3/'15   LVEF 60-65%, wall motion normal, LV function normal, moderate holosystolic MVP (medial the middle scallop of the posterior leaflet), moderate regurgitation (directed posteriorly), no shunt -- stable over 2 years   TRANSTHORACIC ECHOCARDIOGRAM  11/03/2021   EF 60 to 65%.  Normal LV size and function.  Normal diastolic parameters.  Normal RV size and function.  Normal aortic valve.  Normal RAP.  Myxomatous mitral valve with anterior leaflet prolapse, moderate MR-posteriorly directed eccentric MR jet:   TRANSTHORACIC ECHOCARDIOGRAM  10/22/2019   EF 55 to 60%.  Mild LVH.  GRII DD.  Elevated LAP.  Mild LA dilation.  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  Normal aortic valve.  (Stable   WISDOM TOOTH EXTRACTION     Social History   Occupational History    Occupation: works in Engineer, production: [employer]  Tobacco Use   Smoking status: Former    Packs/day: 1.00    Years: 10.00    Total pack years: 10.00    Types: Cigarettes    Quit date: 11/07/2009    Years since quitting: 12.6   Smokeless tobacco: Never  Vaping Use   Vaping Use: Never used  Substance and Sexual Activity   Alcohol use: Yes    Alcohol/week: 1.0 - 2.0 standard drink of alcohol    Types: 1 - 2 Cans of beer per week    Comment: social Beer/Liquor   Drug use: No   Sexual activity: Yes    Partners: Male    Birth control/protection: None

## 2022-07-02 NOTE — Progress Notes (Signed)
Lisa Waters - 41 y.o. female MRN [id number]  Date of birth: [birth year]  Office Visit Note: Visit Date: 06/23/2022 PCP: Denita Lung, MD Referred by: [name], MD  Subjective: Chief Complaint  Patient presents with   Right Hand - Numbness   Left Hand - Numbness   Neck - Pain   HPI:  Lisa Waters is a 41 y.o. female who comes in today at the request of Dr. Rodell Perna for evaluation and management of the Bilateral upper extremities.  Patient is Right hand dominant.  She reports chronic worsening severe neck pain with pain radiating down both arms and referring into the ulnar digits on both sides.  Left is more problematic than right.  No prior electrodiagnostic studies on the chart.  Has had prior cervical fusion.   ROS Otherwise per HPI.  Assessment & Plan: Visit Diagnoses:    ICD-10-CM   1. Paresthesia of skin  R20.2 NCV with EMG (electromyography)      Plan: Impression: Essentially NORMAL electrodiagnostic study of both upper limbs.  There is no significant electrodiagnostic evidence of nerve entrapment, brachial plexopathy or cervical radiculopathy.    As you know, purely sensory or demyelinating radiculopathies and chemical radiculitis may not be detected with this particular electrodiagnostic study. **This electrodiagnostic study cannot rule out small fiber polyneuropathy and dysesthesias from central pain syndromes such as stroke or central pain sensitization syndromes such as fibromyalgia.  Myotomal referral pain from trigger points is also not excluded.  Recommendations: 1.  Follow-up with referring physician. 2.  Continue current management of symptoms. 3.  Suggest MRI and surgical evaluation.  Meds & Orders: No orders of the defined types were placed in this encounter.   Orders Placed This Encounter  Procedures   NCV with EMG (electromyography)    Follow-up: Return in about 2 weeks (around 07/07/2022) for Rodell Perna, MD.   Procedures: No procedures  performed  EMG & NCV Findings: All nerve conduction studies (as indicated in the following tables) were within normal limits.  All left vs. right side differences were within normal limits.    All examined muscles (as indicated in the following table) showed no evidence of electrical instability.    Impression: Essentially NORMAL electrodiagnostic study of both upper limbs.  There is no significant electrodiagnostic evidence of nerve entrapment, brachial plexopathy or cervical radiculopathy.    As you know, purely sensory or demyelinating radiculopathies and chemical radiculitis may not be detected with this particular electrodiagnostic study. **This electrodiagnostic study cannot rule out small fiber polyneuropathy and dysesthesias from central pain syndromes such as stroke or central pain sensitization syndromes such as fibromyalgia.  Myotomal referral pain from trigger points is also not excluded.  Recommendations: 1.  Follow-up with referring physician. 2.  Continue current management of symptoms. 3.  Suggest MRI and surgical evaluation.  ___________________________ [name] FAAPMR Board Certified, American Board of Physical Medicine and Rehabilitation    Nerve Conduction Studies Anti Sensory Summary Table   Stim Site NR Peak (ms) Norm Peak (ms) P-T Amp (V) Norm P-T Amp Site1 Site2 Delta-P (ms) Dist (cm) Vel (m/s) Norm Vel (m/s)  Left Median Acr Palm Anti Sensory (2nd Digit)  29.4C  Wrist    2.9 <3.6 36.2 >10 Wrist Palm 1.3 0.0    Palm    1.6 <2.0 42.3         Right Median Acr Palm Anti Sensory (2nd Digit)  30C  Wrist    3.2 <3.6 31.8 >10 Wrist Palm  1.4 0.0    Palm    1.8 <2.0 38.8         Left Radial Anti Sensory (Base 1st Digit)  29.3C  Wrist    2.2 <3.1 39.6  Wrist Base 1st Digit 2.2 0.0    Right Radial Anti Sensory (Base 1st Digit)  29.6C  Wrist    2.2 <3.1 35.8  Wrist Base 1st Digit 2.2 0.0    Left Ulnar Anti Sensory (5th Digit)  30.2C  Wrist    3.4 <3.7 17.2  >15.0 Wrist 5th Digit 3.4 14.0 41 >38  Right Ulnar Anti Sensory (5th Digit)  29.9C  Wrist    3.3 <3.7 27.8 >15.0 Wrist 5th Digit 3.3 14.0 42 >38   Motor Summary Table   Stim Site NR Onset (ms) Norm Onset (ms) O-P Amp (mV) Norm O-P Amp Site1 Site2 Delta-0 (ms) Dist (cm) Vel (m/s) Norm Vel (m/s)  Left Median Motor (Abd Poll Brev)  29.7C  Wrist    2.7 <4.2 10.9 >5 Elbow Wrist 3.9 22.0 56 >50  Elbow    6.6  8.2         Right Median Motor (Abd Poll Brev)  29.6C  Wrist    3.0 <4.2 8.5 >5 Elbow Wrist 3.8 20.5 54 >50  Elbow    6.8  8.5         Left Ulnar Motor (Abd Dig Min)  29.7C  Wrist    3.0 <4.2 9.7 >3 B Elbow Wrist 2.9 18.0 62 >53  B Elbow    5.9  9.9  A Elbow B Elbow 1.1 11.0 100 >53  A Elbow    7.0  9.7         Right Ulnar Motor (Abd Dig Min)  29.4C  Wrist    2.9 <4.2 10.1 >3 B Elbow Wrist 3.2 18.5 58 >53  B Elbow    6.1  9.5  A Elbow B Elbow 1.3 11.0 85 >53  A Elbow    7.4  9.7          EMG   Side Muscle Nerve Root Ins Act Fibs Psw Amp Dur Poly Recrt Int Fraser Din Comment  Left Abd Poll Brev Median C8-T1 Nml Nml Nml Nml Nml 0 Nml Nml   Left 1stDorInt Ulnar C8-T1 Nml Nml Nml Nml Nml 0 Nml Nml   Left PronatorTeres Median C6-7 Nml Nml Nml Nml Nml 0 Nml Nml   Left Biceps Musculocut C5-6 Nml Nml Nml Nml Nml 0 Nml Nml   Left Deltoid Axillary C5-6 Nml Nml Nml Nml Nml 0 Nml Nml   Right 1stDorInt Ulnar C8-T1 Nml Nml Nml Nml Nml 0 Nml Nml   Right Abd Poll Brev Median C8-T1 Nml Nml Nml Nml Nml 0 Nml Nml   Right ExtDigCom   Nml Nml Nml Nml Nml 0 Nml Nml   Right Triceps Radial C6-7-8 Nml Nml Nml Nml Nml 0 Nml Nml   Right Deltoid Axillary C5-6 Nml Nml Nml Nml Nml 0 Nml Nml     Nerve Conduction Studies Anti Sensory Left/Right Comparison   Stim Site L Lat (ms) R Lat (ms) L-R Lat (ms) L Amp (V) R Amp (V) L-R Amp (%) Site1 Site2 L Vel (m/s) R Vel (m/s) L-R Vel (m/s)  Median Acr Palm Anti Sensory (2nd Digit)  29.4C  Wrist 2.9 3.2 0.3 36.2 31.8 12.2 Wrist Palm     Palm 1.6 1.8 0.2 42.3 38.8  8.3       Radial Anti Sensory (  Base 1st Digit)  29.3C  Wrist 2.2 2.2 0.0 39.6 35.8 9.6 Wrist Base 1st Digit     Ulnar Anti Sensory (5th Digit)  30.2C  Wrist 3.4 3.3 0.1 17.2 27.8 38.1 Wrist 5th Digit 41 42 1   Motor Left/Right Comparison   Stim Site L Lat (ms) R Lat (ms) L-R Lat (ms) L Amp (mV) R Amp (mV) L-R Amp (%) Site1 Site2 L Vel (m/s) R Vel (m/s) L-R Vel (m/s)  Median Motor (Abd Poll Brev)  29.7C  Wrist 2.7 3.0 0.3 10.9 8.5 22.0 Elbow Wrist 56 54 2  Elbow 6.6 6.8 0.2 8.2 8.5 3.5       Ulnar Motor (Abd Dig Min)  29.7C  Wrist 3.0 2.9 0.1 9.7 10.1 4.0 B Elbow Wrist 62 58 4  B Elbow 5.9 6.1 0.2 9.9 9.5 4.0 A Elbow B Elbow 100 85 15  A Elbow 7.0 7.4 0.4 9.7 9.7 0.0          Waveforms:                      Clinical History: No specialty comments available.     Objective:  VS:  HT:    WT:   BMI:     BP:   HR: bpm  TEMP: ( )  RESP:  Physical Exam Musculoskeletal:        General: No swelling, tenderness or deformity.     Comments: Inspection reveals no atrophy of the bilateral APB or FDI or hand intrinsics. There is no swelling, color changes, allodynia or dystrophic changes. There is 5 out of 5 strength in the bilateral wrist extension, finger abduction and long finger flexion. There is intact sensation to light touch in all dermatomal and peripheral nerve distributions. There is a negative Froment's test bilaterally. There is a negative Tinel's test at the bilateral wrist and elbow. There is a negative Phalen's test bilaterally. There is a negative Hoffmann's test bilaterally.  Skin:    General: Skin is warm and dry.     Findings: No erythema or rash.  Neurological:     General: No focal deficit present.     Mental Status: She is alert and oriented to person, place, and time.     Motor: No weakness or abnormal muscle tone.     Coordination: Coordination normal.  Psychiatric:        Mood and Affect: Mood normal.        Behavior: Behavior normal.       Imaging: No results found.

## 2022-07-03 ENCOUNTER — Encounter: Payer: Self-pay | Admitting: Orthopaedic Surgery

## 2022-07-03 DIAGNOSIS — Z9189 Other specified personal risk factors, not elsewhere classified: Secondary | ICD-10-CM | POA: Diagnosis not present

## 2022-07-03 DIAGNOSIS — Z9013 Acquired absence of bilateral breasts and nipples: Secondary | ICD-10-CM | POA: Diagnosis not present

## 2022-07-03 DIAGNOSIS — Z923 Personal history of irradiation: Secondary | ICD-10-CM | POA: Diagnosis not present

## 2022-07-03 DIAGNOSIS — N6001 Solitary cyst of right breast: Secondary | ICD-10-CM | POA: Diagnosis not present

## 2022-07-03 DIAGNOSIS — Z8571 Personal history of Hodgkin lymphoma: Secondary | ICD-10-CM | POA: Diagnosis not present

## 2022-07-03 DIAGNOSIS — R222 Localized swelling, mass and lump, trunk: Secondary | ICD-10-CM | POA: Diagnosis not present

## 2022-07-06 ENCOUNTER — Ambulatory Visit: Payer: BC Managed Care – PPO | Admitting: Rehabilitative and Restorative Service Providers"

## 2022-07-06 ENCOUNTER — Encounter: Payer: Self-pay | Admitting: Rehabilitative and Restorative Service Providers"

## 2022-07-06 ENCOUNTER — Encounter: Payer: Self-pay | Admitting: Cardiology

## 2022-07-06 DIAGNOSIS — M542 Cervicalgia: Secondary | ICD-10-CM | POA: Diagnosis not present

## 2022-07-06 DIAGNOSIS — R262 Difficulty in walking, not elsewhere classified: Secondary | ICD-10-CM | POA: Diagnosis not present

## 2022-07-06 DIAGNOSIS — R293 Abnormal posture: Secondary | ICD-10-CM | POA: Diagnosis not present

## 2022-07-06 NOTE — Telephone Encounter (Signed)
FYI. Patient reported that for the past 3 days, she has experienced pain on her left side, rating it 3-4/10. She stated it is more uncomfortable than painful. She denies issues with her eyes, chest pain, SOB or dizziness/lightheadedness. She was at oncologist 2 days ago with normal BP/P. She went to PT today. Nothing makes the pain better or worse. She had cervical fusion weeks ago. Any advise for patient?

## 2022-07-06 NOTE — Therapy (Addendum)
OUTPATIENT PHYSICAL THERAPY CERVICAL EVALUATION   Patient Name: Lisa Waters MRN: [id number] DOB:07/06/1981, 41 y.o., female Today's Date: 07/06/2022  END OF SESSION:  PT End of Session - 07/06/22 1618     Visit Number 1    Number of Visits 16    PT Start Time 2951    PT Stop Time 1510    PT Time Calculation (min) 42 min    Activity Tolerance Patient tolerated treatment well;No increased pain;Patient limited by fatigue    Behavior During Therapy [facility] for tasks assessed/performed             Past Medical History:  Diagnosis Date   Anxiety    Asthma    mild intermittent as of 07/2017 - just when patient gets sick   Depression    Female infertility    GERD (gastroesophageal reflux disease)    Hx - no current problems, no meds   H/O amaurosis fugax Fall 2012   Resolved, no current problems, Hx-MRI of brain/brain stem: no acute abnormality, small area of encephalomalacia left superior cerebellum that could be prior trauma, infection, or lacunar infarct   History of Hodgkin's lymphoma 2007   in remission, sees [facility], prior with Dr. Elwanda [place]; prior chemotherapy and radiation therapy; sees yearly in December, no current problems   Hyperlipidemia 07/2017   diet control, no meds   Hypothyroidism    Moderate mitral regurgitation by prior echocardiogram 09/2010   10/2015: EF 55-60%. Normal WM. Normal D Fxn. Bilateral MVP w/ Mod MR (very eccentric). Normal LA, RV & RA. Normal PAP.   MVP (mitral valve prolapse) 09/2010   Dr. Ellyn Hack, [facility]; a) Echo 3/'12: Mod MR; EF 60-65%; b) TEE 5/'12: Nl LV Fxn, EF 60-65%, mild, holosystolic prolapse of the medial anterior leaflet. Mod MR . c) Echo 3/'15: Moderate, holosystolic prolapse of  medial. Mod MR d) TM Stress Echo (@ [place]) Nl Lv Fxn, Mild-Mod MR @ Res0 --> Mod MR at HR 181 bpm (7:22 min, 10.10 METS) w/p WMA   Oral ulcer    Palpitations 10/2012   holter monitor, Dr. Ellyn Hack; no arrhythmia, no current problems   PONV (postoperative nausea  and vomiting)    Past Surgical History:  Procedure Laterality Date   ANTERIOR CERVICAL DECOMP/DISCECTOMY FUSION N/A 09/09/2021   Procedure: C6-7 ANTERIOR CERVICAL DISCECTOMY FUSION, ALLOGRAFT, PLATE;  Surgeon: [name], MD;  Location: [facility];  Service: Orthopedics;  Laterality: N/A;   [place] SURGERY  2002   COLONOSCOPY  07/2014   Dr. Collene Mares - normal   DILATATION & CURETTAGE/HYSTEROSCOPY WITH MYOSURE N/A 01/29/2018   Procedure: DILATATION & CURETTAGE/HYSTEROSCOPY WITH MYOSURE POLYPECTOMY;  Surgeon: Charyl Bigger, MD;  Location: [facility];  Service: Gynecology;  Laterality: N/A;   ESOPHAGOGASTRODUODENOSCOPY  04/11/2013   [facility] Dr. Collene Mares   FOOT SURGERY Bilateral    on toes   INSERTION CENTRAL VENOUS ACCESS DEVICE W/ SUBCUTANEOUS PORT  2006   LYMPH NODE BIOPSY  2006   under right arm   MASTECTOMY Bilateral 02/2020   double mastectomy. Has had 9 surgical procedures on breast since then.   NM MYOCAR PERF WALL MOTION  12/2010   persantine myoview - moderate breast attenuation (fixed mid-distal anterior defect), EF 68%, low risk scan   RHINOPLASTY  2007   deviated septum   TEE WITHOUT CARDIOVERSION  11/2010   Nl LV Fxn, EF 60-65%, mild, holosystolic prolapse of the medial anterior leaflet. Mod MR .  TRANSTHORACIC ECHOCARDIOGRAM  3/'14; 3/'15   LVEF 60-65%, wall motion normal, LV function normal, moderate holosystolic MVP (medial the middle scallop of the posterior leaflet), moderate regurgitation (directed posteriorly), no shunt -- stable over 2 years   TRANSTHORACIC ECHOCARDIOGRAM  11/03/2021   EF 60 to 65%.  Normal LV size and function.  Normal diastolic parameters.  Normal RV size and function.  Normal aortic valve.  Normal RAP.  Myxomatous mitral valve with anterior leaflet prolapse, moderate MR-posteriorly directed eccentric MR jet:   TRANSTHORACIC ECHOCARDIOGRAM  10/22/2019   EF 55 to 60%.  Mild LVH.  GRII DD.  Elevated LAP.  Mild  LA dilation.  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  Normal aortic valve.  (Stable   WISDOM TOOTH EXTRACTION     Patient Active Problem List   Diagnosis Date Noted   History of fusion of cervical spine 06/28/2022   Bilateral hand numbness 06/14/2022   Preoperative cardiovascular examination 02/05/2022   History of cervical discectomy 11/01/2021   Cervical spinal stenosis 09/09/2021   Hyperlipidemia LDL goal <100 08/31/2020   Essential hypertension 08/30/2020   Vitamin D deficiency 11/12/2018   Acute non-recurrent maxillary sinusitis 12/07/2017   Vaccine counseling 07/26/2017   Routine general medical examination at a health care facility 05/30/2016   Other specified hypothyroidism 04/01/2015   Major depression, recurrent ([place]) 05/25/2013   Allergic rhinitis 01/20/2013   Female infertility of other specified origin 04/09/2012   Raynauds disease 06/29/2011   Hodgkin's disease in remission ([place]) 12/29/2010   MVP (mitral valve prolapse) 12/29/2010    PCP: Denita Lung, MD  REFERRING PROVIDER: [name], MD  REFERRING DIAG:  Diagnosis  Z98.1 (ICD-10-CM) - History of fusion of cervical spine    THERAPY DIAG:  Abnormal posture - Plan: PT plan of care cert/re-cert  Difficulty in walking, not elsewhere classified - Plan: PT plan of care cert/re-cert  Cervicalgia - Plan: PT plan of care cert/re-cert  Rationale for Evaluation and Treatment: Rehabilitation  ONSET DATE: 09/09/2021  SUBJECTIVE:                                                                                                                                                                                                         SUBJECTIVE STATEMENT: Lisa Waters had cancer 12 years ago with chemo and radiation.  One of her complications with the cancer treatment was spine pain.  She had ACDF C6-7 on 09/09/2021.  She wore a neck brace after surgery for 6 weeks and appeared to do well up until September 2023.  In September  2023,  she started having low back pain.  Now she is having neck and Lt arm/hand pain intermittently.  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  PERTINENT HISTORY:  Anxiety, asthma, depression, h/o amaurosis fugas, Hodgkin's Lymphoma, hypothoidism, hyperlipidemia, HTN, MVP, bilateral foot surgery, Raynauds disease  PAIN:  Are you having pain? Yes: NPRS scale: This week neck and L UE 3-7/10 and low back Lt leg to the toes 2-4/10 Pain location: Neck, Lt UE, back and Lt LE Pain description: Occasional "jolt" and constant tingling  Aggravating factors: Prolonged postures, difficulty sleeping Relieving factors: Nothing yet  PRECAUTIONS: Other: General postural precautions for someone post ACDF  WEIGHT BEARING RESTRICTIONS: No  FALLS:  Has patient fallen in last 6 months? 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  PATIENT SURVEYS:  FOTO 50 (Goal 60 in 12 visits)  COGNITION: Overall cognitive status: Within functional limits for tasks assessed  SENSATION: Allissa notes left upper extremity and lower extremity radicular symptoms to the hands and feet.  Upper extremity symptoms are present most of the time whereas lower extremity symptoms are extremely intermittent.  POSTURE: rounded shoulders, forward head, and decreased lumbar lordosis  CERVICAL ROM:   Active ROM A/PROM (deg) eval  Lumbar Extension  10  Cervical Extension 35  Cervical Right lateral flexion   Cervical Left lateral flexion   Cervical  Right rotation 30  Cervical Left rotation 35   (Blank rows = not tested)  UPPER EXTREMITY ROM:  Passive ROM Right eval Left eval  Shoulder flexion    Shoulder extension    Shoulder abduction    Shoulder adduction    Shoulder extension    Shoulder internal rotation    Shoulder external rotation    Elbow flexion    Elbow extension    Wrist flexion    Wrist extension    Wrist ulnar deviation    Wrist radial deviation    Wrist pronation    Wrist supination     (Blank rows = not tested)  STRENGTH:  In Pounds assessed with hand-held dynamometer 07/06/2022   Shoulder flexion    Shoulder extension    Shoulder abduction    Shoulder adduction    Shoulder extension    Shoulder internal rotation    Shoulder external rotation    Middle trapezius    Lower trapezius    Cervical lateral flexion 4.1/4.1   Cervical extension 13.8   Wrist flexion    Wrist extension    Wrist ulnar deviation    Wrist radial deviation    Wrist pronation    Wrist supination    Grip strength     (Blank rows = not tested)  TODAY'S TREATMENT:  DATE: 07/06/2022 Shoulder blade pinches 10 x 5 seconds Cervical extension isometrics 10 x 5 seconds Trunk extension active range of motion 10 x 3 seconds  Functional activities: Reviewed spine anatomy, posture, the importance of changing position frequently, correct lumbar roll use and prescribed a home walking and exercise program  PATIENT EDUCATION:  Education details: See above Person educated: Patient Education method: Explanation, Demonstration, Tactile cues, Verbal cues, and Handouts Education comprehension: verbalized understanding, returned demonstration, verbal cues required, tactile cues required, and needs further education  HOME EXERCISE PROGRAM: Access Code: [email or web address] URL: [email or web address] Date:  07/06/2022 Prepared by: Vista Mink  Exercises - Standing Scapular Retraction  - 5 x daily - 7 x weekly - 1 sets - 5 reps - 5 second hold - Standing Isometric Cervical Extension with Manual Resistance  - 5 x daily - 7 x weekly - 1 sets - 5 reps - 5 hold - Standing Lumbar Extension at [place]  - 5 x daily - 7 x weekly - 1 sets - 5 reps - 3 seconds hold  ASSESSMENT:  CLINICAL IMPRESSION: Patient is a 41 y.o. female who was seen today for physical therapy evaluation and treatment for neck pain status post C6-7 ACDF.  Kit did great initially after her surgery but has had increasing symptoms since September of this year.  She notes cervical and left upper extremity symptoms that are relatively constant, whereas her low back and left lower extremity symptoms are intermittent.  She is very weak in her cervical spine and postural muscles and this will be a focus early in her rehabilitation.  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  OBJECTIVE IMPAIRMENTS: cardiopulmonary status limiting activity, decreased activity tolerance, decreased endurance, decreased knowledge of condition, difficulty walking, decreased ROM, decreased strength, decreased safety awareness, impaired perceived functional ability, increased muscle spasms, impaired UE functional use, improper body mechanics, postural dysfunction, obesity, and pain.   ACTIVITY LIMITATIONS: carrying, lifting, bending, sitting, standing, squatting, sleeping, stairs, bed mobility, reach over head, and locomotion level  PARTICIPATION LIMITATIONS: interpersonal relationship, community activity, and occupation  PERSONAL FACTORS: Anxiety, asthma, depression, h/o amaurosis fugas, Hodgkin's Lymphoma, hypothoidism, hyperlipidemia, HTN, MVP, bilateral foot surgery,  Raynauds disease are also affecting patient's functional outcome.   REHAB POTENTIAL: Good  CLINICAL DECISION MAKING: Stable/uncomplicated  EVALUATION COMPLEXITY: Low   GOALS: Goals reviewed with patient? Yes  SHORT TERM GOALS: Target date: 08/03/2022  Adaley will improve her cervical strength for extension to at least 25 pounds and lateral bending to at least 12 pounds Baseline: ~ 13 and ~ 4 pounds respectively Goal status: INITIAL  2.  Junette will have improved postural awareness and will be able to implement this into all static and dynamic activities Baseline: Just started addressing at evaluation 07/06/2022 Goal status: INITIAL  3.  Syrita will be independent with her beginner starting HEP Baseline: Started 07/06/2022 Goal status: INITIAL   LONG TERM GOALS: Target date: 08/31/2022  Improve FOTO to 60 Baseline: 50 Goal status: INITIAL  2.  Improve cervical and low back pain to consistently 0-3 out of 10 on the visual analog scale with no complaints of upper or lower extremity peripheral symptoms Baseline: Can be as high as 7 out of 10 with left upper and lower extremity peripheral symptoms Goal status: INITIAL  3.  Improve cervical  extension active range of motion to 40 degrees and rotation to 45 degrees Baseline: 35 and 35/30 respectively Goal status: INITIAL  4.  Tenlee will improve her cervical strength for extension to 40 pounds and lateral bending to 20 pounds Baseline: ~13 and ~ 4 pounds respectively Goal status: INITIAL  5.  Shamaya will return to walking at least a mile at a time 3 or more times a week at discharge Baseline: None at evaluation Goal status: INITIAL  6.  Mersadie will be independent with a long-term home exercise program at discharge Baseline: Started 07/06/2022 Goal status: INITIAL   PLAN:  PT FREQUENCY: 1-2x/week  PT DURATION: 8 weeks  PLANNED INTERVENTIONS: Therapeutic exercises, Therapeutic activity, Neuromuscular re-education, Balance  training, Gait training, Patient/Family education, Self Care, Joint mobilization, Stair training, Cryotherapy, Moist heat, and Manual therapy, dry needling  PLAN FOR NEXT SESSION: Review current home exercise program, appropriately progress scapular, cervical and low back strength.  Consider general and functional lower extremity strengthening, postural and body mechanics education.   Farley Ly, PT, MPT 07/06/2022, 4:37 PM

## 2022-07-07 NOTE — Telephone Encounter (Signed)
Patient informed of Dr. Allison Quarry response: "Doesn't really sounds Cardiac  - if persists, would recommend PCP visit." She thanked me for the call back and said she would contact pcp or rheumatologist.

## 2022-07-07 NOTE — Telephone Encounter (Signed)
Doesn't really sounds Cardiac  - if persists, would recommend PCP visit.  [facility]

## 2022-07-07 NOTE — Telephone Encounter (Signed)
Doesn't really sounds Cardiac  - if persists, would recommend PCP visit.   [place]

## 2022-07-12 ENCOUNTER — Encounter: Payer: Self-pay | Admitting: Physical Therapy

## 2022-07-12 ENCOUNTER — Ambulatory Visit: Payer: BC Managed Care – PPO | Admitting: Physical Therapy

## 2022-07-12 DIAGNOSIS — R262 Difficulty in walking, not elsewhere classified: Secondary | ICD-10-CM

## 2022-07-12 DIAGNOSIS — M546 Pain in thoracic spine: Secondary | ICD-10-CM | POA: Diagnosis not present

## 2022-07-12 DIAGNOSIS — M542 Cervicalgia: Secondary | ICD-10-CM

## 2022-07-12 DIAGNOSIS — R293 Abnormal posture: Secondary | ICD-10-CM | POA: Diagnosis not present

## 2022-07-12 DIAGNOSIS — M6281 Muscle weakness (generalized): Secondary | ICD-10-CM

## 2022-07-12 NOTE — Addendum Note (Signed)
Addended by: Maricela Bo on: 07/12/2022 05:14 PM   Modules accepted: Orders

## 2022-07-12 NOTE — Therapy (Signed)
OUTPATIENT PHYSICAL THERAPY TREATMENT NOTE   Patient Name: Lisa Waters MRN: [id number] DOB:1980-08-29, 41 y.o., female Today's Date: 07/12/2022  END OF SESSION:   PT End of Session - 07/12/22 1432     Visit Number 2    Number of Visits 16    PT Start Time 1430    PT Stop Time 1510    PT Time Calculation (min) 40 min    Activity Tolerance Patient tolerated treatment well;No increased pain;Patient limited by fatigue    Behavior During Therapy [facility] for tasks assessed/performed             Past Medical History:  Diagnosis Date   Anxiety    Asthma    mild intermittent as of 07/2017 - just when patient gets sick   Depression    Female infertility    GERD (gastroesophageal reflux disease)    Hx - no current problems, no meds   H/O amaurosis fugax Fall 2012   Resolved, no current problems, Hx-MRI of brain/brain stem: no acute abnormality, small area of encephalomalacia left superior cerebellum that could be prior trauma, infection, or lacunar infarct   History of Hodgkin's lymphoma 2007   in remission, sees [facility], prior with Dr. Elwanda Waters; prior chemotherapy and radiation therapy; sees yearly in December, no current problems   Hyperlipidemia 07/2017   diet control, no meds   Hypothyroidism    Moderate mitral regurgitation by prior echocardiogram 09/2010   10/2015: EF 55-60%. Normal WM. Normal D Fxn. Bilateral MVP w/ Mod MR (very eccentric). Normal LA, RV & RA. Normal PAP.   MVP (mitral valve prolapse) 09/2010   Dr. Ellyn Waters, [facility]; a) Echo 3/'12: Mod MR; EF 60-65%; b) TEE 5/'12: Nl LV Fxn, EF 60-65%, mild, holosystolic prolapse of the medial anterior leaflet. Mod MR . c) Echo 3/'15: Moderate, holosystolic prolapse of  medial. Mod MR d) TM Stress Echo (@ [place]) Nl Lv Fxn, Mild-Mod MR @ Res0 --> Mod MR at HR 181 bpm (7:22 min, 10.10 METS) w/p WMA   Oral ulcer    Palpitations 10/2012   holter monitor, Dr. Ellyn Waters; no arrhythmia, no current problems   PONV (postoperative nausea and  vomiting)    Past Surgical History:  Procedure Laterality Date   ANTERIOR CERVICAL DECOMP/DISCECTOMY FUSION N/A 09/09/2021   Procedure: C6-7 ANTERIOR CERVICAL DISCECTOMY FUSION, ALLOGRAFT, PLATE;  Surgeon: Lisa Killings, MD;  Location: [facility];  Service: Orthopedics;  Laterality: N/A;   [place] SURGERY  2002   COLONOSCOPY  07/2014   Dr. Collene Waters - normal   DILATATION & CURETTAGE/HYSTEROSCOPY WITH MYOSURE N/A 01/29/2018   Procedure: DILATATION & CURETTAGE/HYSTEROSCOPY WITH MYOSURE POLYPECTOMY;  Surgeon: Lisa Bigger, MD;  Location: [facility];  Service: Gynecology;  Laterality: N/A;   ESOPHAGOGASTRODUODENOSCOPY  04/11/2013   [facility] Dr. Collene Waters   FOOT SURGERY Bilateral    on toes   INSERTION CENTRAL VENOUS ACCESS DEVICE W/ SUBCUTANEOUS PORT  2006   LYMPH NODE BIOPSY  2006   under right arm   MASTECTOMY Bilateral 02/2020   double mastectomy. Has had 9 surgical procedures on breast since then.   NM MYOCAR PERF WALL MOTION  12/2010   persantine myoview - moderate breast attenuation (fixed mid-distal anterior defect), EF 68%, low risk scan   RHINOPLASTY  2007   deviated septum   TEE WITHOUT CARDIOVERSION  11/2010   Nl LV Fxn, EF 60-65%, mild, holosystolic prolapse of the medial anterior leaflet. Mod MR .  TRANSTHORACIC ECHOCARDIOGRAM  3/'14; 3/'15   LVEF 60-65%, wall motion normal, LV function normal, moderate holosystolic MVP (medial the middle scallop of the posterior leaflet), moderate regurgitation (directed posteriorly), no shunt -- stable over 2 years   TRANSTHORACIC ECHOCARDIOGRAM  11/03/2021   EF 60 to 65%.  Normal LV size and function.  Normal diastolic parameters.  Normal RV size and function.  Normal aortic valve.  Normal RAP.  Myxomatous mitral valve with anterior leaflet prolapse, moderate MR-posteriorly directed eccentric MR jet:   TRANSTHORACIC ECHOCARDIOGRAM  10/22/2019   EF 55 to 60%.  Mild LVH.  GRII DD.  Elevated LAP.  Mild LA  dilation.  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  Normal aortic valve.  (Stable   WISDOM TOOTH EXTRACTION     Patient Active Problem List   Diagnosis Date Noted   History of fusion of cervical spine 06/28/2022   Bilateral hand numbness 06/14/2022   Preoperative cardiovascular examination 02/05/2022   History of cervical discectomy 11/01/2021   Cervical spinal stenosis 09/09/2021   Hyperlipidemia LDL goal <100 08/31/2020   Essential hypertension 08/30/2020   Vitamin D deficiency 11/12/2018   Acute non-recurrent maxillary sinusitis 12/07/2017   Vaccine counseling 07/26/2017   Routine general medical examination at a health care facility 05/30/2016   Other specified hypothyroidism 04/01/2015   Major depression, recurrent ([place]) 05/25/2013   Allergic rhinitis 01/20/2013   Female infertility of other specified origin 04/09/2012   Raynauds disease 06/29/2011   Hodgkin's disease in remission ([place]) 12/29/2010   MVP (mitral valve prolapse) 12/29/2010     THERAPY DIAG:  Abnormal posture  Difficulty in walking, not elsewhere classified  Pain in thoracic spine  Cervicalgia  Muscle weakness (generalized)   PCP: Lisa Lung, MD   REFERRING PROVIDER: [name], MD   REFERRING DIAG:  Diagnosis  Z98.1 (ICD-10-CM) - History of fusion of cervical spine      THERAPY DIAG:  Abnormal posture - Plan: PT plan of care cert/re-cert   Difficulty in walking, not elsewhere classified - Plan: PT plan of care cert/re-cert   Cervicalgia - Plan: PT plan of care cert/re-cert   Rationale for Evaluation and Treatment: Rehabilitation   ONSET DATE: 09/09/2021   SUBJECTIVE:                                                                                                                                                                                                          SUBJECTIVE STATEMENT: Doing exercises pretty frequently, pain is in Lt scapular area.    EVAL: Lisa Waters had  cancer 12 years ago  with chemo and radiation.  One of her complications with the cancer treatment was spine pain.  She had ACDF C6-7 on 09/09/2021.  She wore a neck brace after surgery for 6 weeks and appeared to do well up until September 2023.  In September 2023, she started having low back pain.  Now she is having neck and Lt arm/hand pain intermittently.  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   PERTINENT HISTORY:  Anxiety, asthma, depression, h/o amaurosis fugas, Hodgkin's Lymphoma, hypothoidism, hyperlipidemia, HTN, MVP, bilateral foot surgery, Raynauds disease   PAIN:  Are you having pain? Yes: NPRS scale: This week neck and L UE 3-7/10 and low back Lt leg to the toes 2-4/10 Pain location: Neck, Lt UE, back and Lt LE Pain description: Occasional "jolt" and constant tingling  Aggravating factors: Prolonged postures, difficulty sleeping Relieving factors: Nothing yet   PRECAUTIONS: Other: General postural precautions for someone post ACDF   WEIGHT BEARING RESTRICTIONS: No   FALLS:  Has patient fallen in last 6 months? 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  Upper extremity symptoms are present most of the time whereas lower extremity symptoms are extremely intermittent.   POSTURE: rounded shoulders, forward head, and decreased lumbar lordosis   CERVICAL ROM:    Active ROM A/PROM (deg) eval  Lumbar Extension  10   Cervical Extension 35  Cervical Right lateral flexion    Cervical Left lateral flexion    Cervical Right rotation 30  Cervical Left rotation 35   (Blank rows = not tested)   UPPER EXTREMITY ROM:   Passive ROM Right eval Left eval  Shoulder flexion      Shoulder extension      Shoulder abduction      Shoulder adduction      Shoulder extension      Shoulder internal rotation      Shoulder external rotation      Elbow flexion      Elbow extension      Wrist flexion      Wrist extension      Wrist ulnar deviation      Wrist radial deviation      Wrist pronation      Wrist supination       (Blank rows = not tested)   STRENGTH:   In Pounds assessed with hand-held dynamometer 07/06/2022    Shoulder flexion      Shoulder extension      Shoulder abduction      Shoulder adduction      Shoulder extension      Shoulder internal rotation      Shoulder external rotation      Middle trapezius      Lower trapezius      Cervical lateral flexion 4.1/4.1    Cervical extension 13.8    Wrist flexion  Wrist extension      Wrist ulnar deviation      Wrist radial deviation      Wrist pronation      Wrist supination      Grip strength       (Blank rows = not tested)   TODAY'S TREATMENT:                                                                                                                              DATE:  07/12/22 TherEx Shoulder blade pinches 10 x 5 seconds Cervical extension isometrics 10 x 5 seconds Trunk extension active range of motion 10 x 3 seconds Trial of wall and counter push ups for scapular stabilization - better control on wall Trial of quadruped weight bearing with hip extension for increased scapular stabilization Supine serratus punch 5# 2x10 bil Supine shoulder circles in 90 deg flexion x 20 rep CW/CWW bil; 5#  Manual IASTM with percussive device to Lt shoulder girdle in prone, twitch responses noted in infraspinatus  07/06/2022 Shoulder  blade pinches 10 x 5 seconds Cervical extension isometrics 10 x 5 seconds Trunk extension active range of motion 10 x 3 seconds   Functional activities: Reviewed spine anatomy, posture, the importance of changing position frequently, correct lumbar roll use and prescribed a home walking and exercise program   PATIENT EDUCATION:  Education details: See above Person educated: Patient Education method: Explanation, Demonstration, Tactile cues, Verbal cues, and Handouts Education comprehension: verbalized understanding, returned demonstration, verbal cues required, tactile cues required, and needs further education   HOME EXERCISE PROGRAM: Access Code: [email or web address] URL: [email or web address] Date: 07/12/2022 Prepared by: Faustino Congress  Exercises - Standing Scapular Retraction  - 5 x daily - 7 x weekly - 1 sets - 5 reps - 5 second hold - Standing Isometric Cervical Extension with Manual Resistance  - 5 x daily - 7 x weekly - 1 sets - 5 reps - 5 hold - Standing Lumbar Extension at Wall - Forearms  - 5 x daily - 7 x weekly - 1 sets - 5 reps - 3 seconds hold - Wall Push Up  - 1 x daily - 7 x weekly - 1 sets - 5-10 reps - Quadruped Alternating Leg Extensions  - 1 x daily - 7 x weekly - 1 sets - 5-10 reps - Supine Scapular Protraction in Flexion with Dumbbells  - 1 x daily - 7 x weekly - 1 sets - 20 reps   ASSESSMENT:   CLINICAL IMPRESSION: Pt tolerated session well today with adding exercises for scapular stabilization.  Overall no goals met as only 2nd visit.  Will continue to benefit from PT to maximize function.   OBJECTIVE IMPAIRMENTS: cardiopulmonary status limiting activity, decreased activity tolerance, decreased endurance, decreased knowledge of condition, difficulty walking, decreased ROM, decreased strength, decreased safety awareness, impaired perceived functional ability, increased muscle spasms, impaired UE functional use, improper body mechanics, postural  dysfunction, obesity, and pain.  ACTIVITY LIMITATIONS: carrying, lifting, bending, sitting, standing, squatting, sleeping, stairs, bed mobility, reach over head, and locomotion level   PARTICIPATION LIMITATIONS: interpersonal relationship, community activity, and occupation   PERSONAL FACTORS: Anxiety, asthma, depression, h/o amaurosis fugas, Hodgkin's Lymphoma, hypothoidism, hyperlipidemia, HTN, MVP, bilateral foot surgery, Raynauds disease are also affecting patient's functional outcome.    REHAB POTENTIAL: Good   CLINICAL DECISION MAKING: Stable/uncomplicated   EVALUATION COMPLEXITY: Low     GOALS: Goals reviewed with patient? Yes   SHORT TERM GOALS: Target date: 08/03/2022   Nakima will improve her cervical strength for extension to at least 25 pounds and lateral bending to at least 12 pounds Baseline: ~ 13 and ~ 4 pounds respectively Goal status: INITIAL   2.  Chrislynn will have improved postural awareness and will be able to implement this into all static and dynamic activities Baseline: Just started addressing at evaluation 07/06/2022 Goal status: INITIAL   3.  Ronne will be independent with her beginner starting HEP Baseline: Started 07/06/2022 Goal status: INITIAL     LONG TERM GOALS: Target date: 08/31/2022   Improve FOTO to 60 Baseline: 50 Goal status: INITIAL   2.  Improve cervical and low back pain to consistently 0-3 out of 10 on the visual analog scale with no complaints of upper or lower extremity peripheral symptoms Baseline: Can be as high as 7 out of 10 with left upper and lower extremity peripheral symptoms Goal status: INITIAL   3.  Improve cervical extension active range of motion to 40 degrees and rotation to 45 degrees Baseline: 35 and 35/30 respectively Goal status: INITIAL   4.  Danita will improve her cervical strength for extension to 40 pounds and lateral bending to 20 pounds Baseline: ~13 and ~ 4 pounds respectively Goal status: INITIAL   5.   Shundra will return to walking at least a mile at a time 3 or more times a week at discharge Baseline: None at evaluation Goal status: INITIAL   6.  Tenaya will be independent with a long-term home exercise program at discharge Baseline: Started 07/06/2022 Goal status: INITIAL     PLAN:   PT FREQUENCY: 1-2x/week   PT DURATION: 8 weeks   PLANNED INTERVENTIONS: Therapeutic exercises, Therapeutic activity, Neuromuscular re-education, Balance training, Gait training, Patient/Family education, Self Care, Joint mobilization, Stair training, Cryotherapy, Moist heat, and Manual therapy   PLAN FOR NEXT SESSION: possible DN. Review current home exercise program, appropriately progress scapular, cervical and low back strength.  Consider general and functional lower extremity strengthening, postural and body mechanics education.   Laureen Abrahams, PT, DPT 07/12/22 3:20 PM

## 2022-07-20 ENCOUNTER — Telehealth: Payer: BC Managed Care – PPO | Admitting: Physician Assistant

## 2022-07-20 DIAGNOSIS — J208 Acute bronchitis due to other specified organisms: Secondary | ICD-10-CM | POA: Diagnosis not present

## 2022-07-20 MED ORDER — BENZONATATE 100 MG PO CAPS: 100.0000 mg | ORAL_CAPSULE | Freq: Three times a day (TID) | ORAL | 0 refills | Status: DC | PRN

## 2022-07-20 MED ORDER — PREDNISONE 10 MG (21) PO TBPK: ORAL_TABLET | ORAL | 0 refills | Status: DC

## 2022-07-20 NOTE — Progress Notes (Signed)
We are sorry that you are not feeling well.  Here is how we plan to help!  Based on your presentation I believe you most likely have A cough due to a virus.  This is called viral bronchitis and is best treated by rest, plenty of fluids and control of the cough.  You may use Ibuprofen or Tylenol as directed to help your symptoms.     In addition you may use A non-prescription cough medication called Mucinex DM: take 2 tablets every 12 hours. and A prescription cough medication called Tessalon Perles 161m. You may take 1-2 capsules every 8 hours as needed for your cough.  Prednisone 10 mg daily for 6 days (see taper instructions below)  Directions for 6 day taper: Day 1: 2 tablets before breakfast, 1 after both lunch & dinner and 2 at bedtime Day 2: 1 tab before breakfast, 1 after both lunch & dinner and 2 at bedtime Day 3: 1 tab at each meal & 1 at bedtime Day 4: 1 tab at breakfast, 1 at lunch, 1 at bedtime Day 5: 1 tab at breakfast & 1 tab at bedtime Day 6: 1 tab at breakfast  From your responses in the eVisit questionnaire you describe inflammation in the upper respiratory tract which is causing a significant cough.  This is commonly called Bronchitis and has four common causes:   Allergies Viral Infections Acid Reflux Bacterial Infection Allergies, viruses and acid reflux are treated by controlling symptoms or eliminating the cause. An example might be a cough caused by taking certain blood pressure medications. You stop the cough by changing the medication. Another example might be a cough caused by acid reflux. Controlling the reflux helps control the cough.  USE OF BRONCHODILATOR ("RESCUE") INHALERS: There is a risk from using your bronchodilator too frequently.  The risk is that over-reliance on a medication which only relaxes the muscles surrounding the breathing tubes can reduce the effectiveness of medications prescribed to reduce swelling and congestion of the tubes themselves.   Although you feel brief relief from the bronchodilator inhaler, your asthma may actually be worsening with the tubes becoming more swollen and filled with mucus.  This can delay other crucial treatments, such as oral steroid medications. If you need to use a bronchodilator inhaler daily, several times per day, you should discuss this with your provider.  There are probably better treatments that could be used to keep your asthma under control.     HOME CARE Only take medications as instructed by your medical team. Complete the entire course of an antibiotic. Drink plenty of fluids and get plenty of rest. Avoid close contacts especially the very young and the elderly Cover your mouth if you cough or cough into your sleeve. Always remember to wash your hands A steam or ultrasonic humidifier can help congestion.   GET HELP RIGHT AWAY IF: You develop worsening fever. You become short of breath You cough up blood. Your symptoms persist after you have completed your treatment plan MAKE SURE YOU  Understand these instructions. Will watch your condition. Will get help right away if you are not doing well or get worse.    Thank you for choosing an e-visit.  Your e-visit answers were reviewed by a board certified advanced clinical practitioner to complete your personal care plan. Depending upon the condition, your plan could have included both over the counter or prescription medications.  Please review your pharmacy choice. Make sure the pharmacy is open so you can  pick up prescription now. If there is a problem, you may contact your provider through [employer] and have the prescription routed to another pharmacy.  Your safety is important to [place]. If you have drug allergies check your prescription carefully.   For the next 24 hours you can use MyChart to ask questions about today's visit, request a non-urgent call back, or ask for a work or school excuse. You will get an email in the next  two days asking about your experience. I hope that your e-visit has been valuable and will speed your recovery.  I have spent 5 minutes in review of e-visit questionnaire, review and updating patient chart, medical decision making and response to patient.   Mar Daring, PA-C

## 2022-07-26 ENCOUNTER — Ambulatory Visit: Payer: BC Managed Care – PPO | Admitting: Rehabilitative and Restorative Service Providers"

## 2022-07-26 ENCOUNTER — Encounter: Payer: Self-pay | Admitting: Internal Medicine

## 2022-07-26 DIAGNOSIS — R262 Difficulty in walking, not elsewhere classified: Secondary | ICD-10-CM | POA: Diagnosis not present

## 2022-07-26 DIAGNOSIS — M542 Cervicalgia: Secondary | ICD-10-CM | POA: Diagnosis not present

## 2022-07-26 DIAGNOSIS — M546 Pain in thoracic spine: Secondary | ICD-10-CM

## 2022-07-26 DIAGNOSIS — M6281 Muscle weakness (generalized): Secondary | ICD-10-CM

## 2022-07-26 DIAGNOSIS — R293 Abnormal posture: Secondary | ICD-10-CM

## 2022-07-26 NOTE — Therapy (Signed)
OUTPATIENT PHYSICAL THERAPY TREATMENT NOTE   Patient Name: Lisa Waters MRN: [id number] DOB:[birth year], 42 y.o., female Today's Date: 07/26/2022  END OF SESSION:   PT End of Session - 07/26/22 1605     Visit Number 3    Number of Visits 16    PT Start Time 1520    PT Stop Time 1027    PT Time Calculation (min) 44 min    Activity Tolerance Patient tolerated treatment well;No increased pain;Patient limited by fatigue    Behavior During Therapy [facility] for tasks assessed/performed              Past Medical History:  Diagnosis Date   Anxiety    Asthma    mild intermittent as of 07/2017 - just when patient gets sick   Depression    Female infertility    GERD (gastroesophageal reflux disease)    Hx - no current problems, no meds   H/O amaurosis fugax Fall 2012   Resolved, no current problems, Hx-MRI of brain/brain stem: no acute abnormality, small area of encephalomalacia left superior cerebellum that could be prior trauma, infection, or lacunar infarct   History of Hodgkin's lymphoma 2007   in remission, sees [facility], prior with Dr. Elwanda [place]; prior chemotherapy and radiation therapy; sees yearly in December, no current problems   Hyperlipidemia 07/2017   diet control, no meds   Hypothyroidism    Moderate mitral regurgitation by prior echocardiogram 09/2010   10/2015: EF 55-60%. Normal WM. Normal D Fxn. Bilateral MVP w/ Mod MR (very eccentric). Normal LA, RV & RA. Normal PAP.   MVP (mitral valve prolapse) 09/2010   Dr. Ellyn Hack, [facility]; a) Echo 3/'12: Mod MR; EF 60-65%; b) TEE 5/'12: Nl LV Fxn, EF 60-65%, mild, holosystolic prolapse of the medial anterior leaflet. Mod MR . c) Echo 3/'15: Moderate, holosystolic prolapse of  medial. Mod MR d) TM Stress Echo (@ [place]) Nl Lv Fxn, Mild-Mod MR @ Res0 --> Mod MR at HR 181 bpm (7:22 min, 10.10 METS) w/p WMA   Oral ulcer    Palpitations 10/2012   holter monitor, Dr. Ellyn Hack; no arrhythmia, no current problems   PONV (postoperative nausea  and vomiting)    Past Surgical History:  Procedure Laterality Date   ANTERIOR CERVICAL DECOMP/DISCECTOMY FUSION N/A 09/09/2021   Procedure: C6-7 ANTERIOR CERVICAL DISCECTOMY FUSION, ALLOGRAFT, PLATE;  Surgeon: [name], MD;  Location: [facility];  Service: Orthopedics;  Laterality: N/A;   [place] SURGERY  2002   COLONOSCOPY  07/2014   Dr. Collene Mares - normal   DILATATION & CURETTAGE/HYSTEROSCOPY WITH MYOSURE N/A 01/29/2018   Procedure: DILATATION & CURETTAGE/HYSTEROSCOPY WITH MYOSURE POLYPECTOMY;  Surgeon: Charyl Bigger, MD;  Location: [facility];  Service: Gynecology;  Laterality: N/A;   ESOPHAGOGASTRODUODENOSCOPY  04/11/2013   [facility] Dr. Collene Mares   FOOT SURGERY Bilateral    on toes   INSERTION CENTRAL VENOUS ACCESS DEVICE W/ SUBCUTANEOUS PORT  2006   LYMPH NODE BIOPSY  2006   under right arm   MASTECTOMY Bilateral 02/2020   double mastectomy. Has had 9 surgical procedures on breast since then.   NM MYOCAR PERF WALL MOTION  12/2010   persantine myoview - moderate breast attenuation (fixed mid-distal anterior defect), EF 68%, low risk scan   RHINOPLASTY  2007   deviated septum   TEE WITHOUT CARDIOVERSION  11/2010   Nl LV Fxn, EF 60-65%, mild, holosystolic prolapse of the medial anterior leaflet. Mod MR .  TRANSTHORACIC ECHOCARDIOGRAM  3/'14; 3/'15   LVEF 60-65%, wall motion normal, LV function normal, moderate holosystolic MVP (medial the middle scallop of the posterior leaflet), moderate regurgitation (directed posteriorly), no shunt -- stable over 2 years   TRANSTHORACIC ECHOCARDIOGRAM  11/03/2021   EF 60 to 65%.  Normal LV size and function.  Normal diastolic parameters.  Normal RV size and function.  Normal aortic valve.  Normal RAP.  Myxomatous mitral valve with anterior leaflet prolapse, moderate MR-posteriorly directed eccentric MR jet:   TRANSTHORACIC ECHOCARDIOGRAM  10/22/2019   EF 55 to 60%.  Mild LVH.  GRII DD.  Elevated LAP.  Mild  LA dilation.  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  Normal aortic valve.  (Stable   WISDOM TOOTH EXTRACTION     Patient Active Problem List   Diagnosis Date Noted   History of fusion of cervical spine 06/28/2022   Bilateral hand numbness 06/14/2022   Preoperative cardiovascular examination 02/05/2022   History of cervical discectomy 11/01/2021   Cervical spinal stenosis 09/09/2021   Hyperlipidemia LDL goal <100 08/31/2020   Essential hypertension 08/30/2020   Vitamin D deficiency 11/12/2018   Acute non-recurrent maxillary sinusitis 12/07/2017   Vaccine counseling 07/26/2017   Routine general medical examination at a health care facility 05/30/2016   Other specified hypothyroidism 04/01/2015   Major depression, recurrent ([place]) 05/25/2013   Allergic rhinitis 01/20/2013   Female infertility of other specified origin 04/09/2012   Raynauds disease 06/29/2011   Hodgkin's disease in remission ([place]) 12/29/2010   MVP (mitral valve prolapse) 12/29/2010     THERAPY DIAG:  Abnormal posture  Difficulty in walking, not elsewhere classified  Pain in thoracic spine  Cervicalgia  Muscle weakness (generalized)   PCP: Denita Lung, MD   REFERRING PROVIDER: [name], MD   REFERRING DIAG:  Diagnosis  Z98.1 (ICD-10-CM) - History of fusion of cervical spine      THERAPY DIAG:  Abnormal posture - Plan: PT plan of care cert/re-cert   Difficulty in walking, not elsewhere classified - Plan: PT plan of care cert/re-cert   Cervicalgia - Plan: PT plan of care cert/re-cert   Rationale for Evaluation and Treatment: Rehabilitation   ONSET DATE: 09/09/2021   SUBJECTIVE:                                                                                                                                                                                                  SUBJECTIVE STATEMENT: Lisa Waters reports excellent HEP compliance.  Pain and function continue to improve.   EVAL: Lisa Waters had cancer 12  years ago with chemo  and radiation.  One of her complications with the cancer treatment was spine pain.  She had ACDF C6-7 on 09/09/2021.  She wore a neck brace after surgery for 6 weeks and appeared to do well up until September 2023.  In September 2023, she started having low back pain.  Now she is having neck and Lt arm/hand pain intermittently.  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   PERTINENT HISTORY:  Anxiety, asthma, depression, h/o amaurosis fugas, Hodgkin's Lymphoma, hypothoidism, hyperlipidemia, HTN, MVP, bilateral foot surgery, Raynauds disease   PAIN:  Are you having pain? Yes: NPRS scale: This week neck and L UE 1-4/10 and low back Lt leg to the toes 0-3/10, not really left leg anymore Pain location: Neck, Lt UE, back and Lt LE Pain description: Occasional "jolt" to the scapula and constant tingling in the fingers Aggravating factors: Prolonged postures, difficulty sleeping Relieving factors: Nothing yet   PRECAUTIONS: Other: General postural precautions for someone post ACDF   WEIGHT BEARING RESTRICTIONS: No   FALLS:  Has patient fallen in last 6 months? 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  Upper extremity symptoms are present most of the time whereas lower extremity symptoms are extremely intermittent.   POSTURE: rounded shoulders, forward head, and decreased lumbar lordosis    CERVICAL ROM:    Active ROM A/PROM (deg) eval AROM 1/?/2023  Lumbar Extension  10   Cervical Extension 35   Cervical Right lateral flexion     Cervical Left lateral flexion     Cervical Right rotation 30   Cervical Left rotation 35    (Blank rows = not tested)   UPPER EXTREMITY ROM:   Passive ROM Right eval Left eval  Shoulder flexion      Shoulder extension      Shoulder abduction      Shoulder adduction      Shoulder extension      Shoulder internal rotation      Shoulder external rotation      Elbow flexion      Elbow extension      Wrist flexion      Wrist extension      Wrist ulnar deviation      Wrist radial deviation      Wrist pronation      Wrist supination       (Blank rows = not tested)   STRENGTH:   In Pounds assessed with hand-held dynamometer 07/06/2022 07/26/2022   Shoulder flexion      Shoulder extension      Shoulder abduction      Shoulder adduction      Shoulder extension      Shoulder internal rotation      Shoulder external rotation      Middle trapezius      Lower trapezius      Cervical lateral flexion 4.1/4.1  15.1/16.4  Cervical  extension 13.8  29.0  Wrist flexion      Wrist extension      Wrist ulnar deviation      Wrist radial deviation      Wrist pronation      Wrist supination      Grip strength       (Blank rows = not tested)   TODAY'S TREATMENT:                                                                                                                              DATE:  07/26/2022 Shoulder blade pinches 10X 5 seconds Cervical Isometrics Extension 10X 5 seconds Rows/Pull to chest theraband 20X Blue Shoulder ER theraband Green 10X Bil Shoulder extension theraband Red palms up 10X Prone 90 degrees chin in neutral 2 sets of 10X 3 seconds Trunk extension AROM 10X 3 seconds Prone 90 degrees (prone T) 2 sets of 10X 3 seconds   07/12/22 TherEx Shoulder blade pinches 10 x 5 seconds Cervical extension isometrics 10 x 5  seconds Trunk extension active range of motion 10 x 3 seconds Trial of wall and counter push ups for scapular stabilization - better control on wall Trial of quadruped weight bearing with hip extension for increased scapular stabilization Supine serratus punch 5# 2x10 bil Supine shoulder circles in 90 deg flexion x 20 rep CW/CWW bil; 5#  Manual IASTM with percussive device to Lt shoulder girdle in prone, twitch responses noted in infraspinatus   07/06/2022 Shoulder blade pinches 10 x 5 seconds Cervical extension isometrics 10 x 5 seconds Trunk extension active range of motion 10 x 3 seconds   Functional activities: Reviewed spine anatomy, posture, the importance of changing position frequently, correct lumbar roll use and prescribed a home walking and exercise program   PATIENT EDUCATION:  Education details: See above Person educated: Patient Education method: Explanation, Demonstration, Tactile cues, Verbal cues, and Handouts Education comprehension: verbalized understanding, returned demonstration, verbal cues required, tactile cues required, and needs further education   HOME EXERCISE PROGRAM: Access Code: [email or web address] URL: [email or web address] Date: 07/26/2022 Prepared by: Vista Mink  Exercises - Standing Scapular Retraction  - 5 x daily - 7 x weekly - 1 sets - 5 reps - 5 second hold - Standing Isometric Cervical Extension with Manual Resistance  - 5 x daily - 7 x weekly - 1 sets - 5 reps - 5 hold - Standing Lumbar Extension at Wall - Forearms  - 5 x daily - 7 x weekly - 1 sets - 5 reps - 3 seconds hold - Wall Push Up  - 1 x daily - 1-7 x weekly - 1 sets - 5-10 reps - Quadruped Alternating Leg Extensions  - 1 x daily - 1-7 x weekly - 1 sets - 5-10 reps - Supine Scapular Protraction in Flexion with Dumbbells  - 1 x daily - 7 x weekly - 1 sets - 20 reps - Prone Alternating Arm and Leg Lifts  -  1 x daily - 7 x weekly - 2 sets - 10 reps - 3-10 seconds hold - Prone  Shoulder Horizontal Abduction with Thumbs Up  - 1 x daily - 7 x weekly - 2 sets - 10 reps - 3 seconds hold - Shoulder extension with resistance - Neutral  - 1 x daily - 7 x weekly - 1-2 sets - 10 reps - 3 hold   ASSESSMENT:   CLINICAL IMPRESSION: Liddie is making great early objective strength progress towards LTGs.  She also notes getting back into her workouts and having less trouble with ADLs.  Continue strength and functional progressions to meet all LTGs.   OBJECTIVE IMPAIRMENTS: cardiopulmonary status limiting activity, decreased activity tolerance, decreased endurance, decreased knowledge of condition, difficulty walking, decreased ROM, decreased strength, decreased safety awareness, impaired perceived functional ability, increased muscle spasms, impaired UE functional use, improper body mechanics, postural dysfunction, obesity, and pain.    ACTIVITY LIMITATIONS: carrying, lifting, bending, sitting, standing, squatting, sleeping, stairs, bed mobility, reach over head, and locomotion level   PARTICIPATION LIMITATIONS: interpersonal relationship, community activity, and occupation   PERSONAL FACTORS: Anxiety, asthma, depression, h/o amaurosis fugas, Hodgkin's Lymphoma, hypothoidism, hyperlipidemia, HTN, MVP, bilateral foot surgery, Raynauds disease are also affecting patient's functional outcome.    REHAB POTENTIAL: Good   CLINICAL DECISION MAKING: Stable/uncomplicated   EVALUATION COMPLEXITY: Low     GOALS: Goals reviewed with patient? Yes   SHORT TERM GOALS: Target date: 08/03/2022   Dacoda will improve her cervical strength for extension to at least 25 pounds and lateral bending to at least 12 pounds Baseline: ~ 13 and ~ 4 pounds respectively Goal status: Met 07/26/2022   2.  Vincent will have improved postural awareness and will be able to implement this into all static and dynamic activities Baseline: Just started addressing at evaluation 07/06/2022 Goal status: On Going  07/26/2022   3.  Sher[place] will be independent with her beginner starting HEP Baseline: Started 07/06/2022 Goal status: Met 07/26/2022     LONG TERM GOALS: Target date: 08/31/2022   Improve FOTO to 60 Baseline: 50 Goal status: INITIAL   2.  Improve cervical and low back pain to consistently 0-3 out of 10 on the visual analog scale with no complaints of upper or lower extremity peripheral symptoms Baseline: Can be as high as 7 out of 10 with left upper and lower extremity peripheral symptoms Goal status: On Going 07/26/2022   3.  Improve cervical extension active range of motion to 40 degrees and rotation to 45 degrees Baseline: 35 and 35/30 respectively Goal status: INITIAL   4.  Avaline will improve her cervical strength for extension to 40 pounds and lateral bending to 20 pounds Baseline: ~13 and ~ 4 pounds respectively Goal status: Ion Going 07/26/2022   5.  Lavender will return to walking at least a mile at a time 3 or more times a week at discharge Baseline: None at evaluation Goal status: Met 07/26/2022   6.  Jazmina will be independent with a long-term home exercise program at discharge Baseline: Started 07/06/2022 Goal status: On Going 07/26/2022     PLAN:   PT FREQUENCY: 1-2x/week   PT DURATION: 8 weeks   PLANNED INTERVENTIONS: Therapeutic exercises, Therapeutic activity, Neuromuscular re-education, Balance training, Gait training, Patient/Family education, Self Care, Joint mobilization, Stair training, Cryotherapy, Moist heat, and Manual therapy   PLAN FOR NEXT SESSION: Review current home exercise program, appropriately progress scapular, cervical and low back strength. Consider general and functional lower  extremity strengthening, postural and body mechanics education.  FOTO and progress note?   Farley Ly PT, MPT 07/26/22 4:50 PM

## 2022-08-02 ENCOUNTER — Encounter: Payer: BC Managed Care – PPO | Admitting: Rehabilitative and Restorative Service Providers"

## 2022-08-11 ENCOUNTER — Encounter: Payer: Self-pay | Admitting: Cardiology

## 2022-08-11 IMAGING — CR DG THORACIC SPINE 3V
3 series · 3 of 3 positions shown · non-contrast
Comparison: Radiographs 01/25/2017.

CLINICAL DATA: One month history of cervical and thoracic pain. No
known injury.

EXAM:
THORACIC SPINE - 3 VIEWS

[w thoracic swimmers]
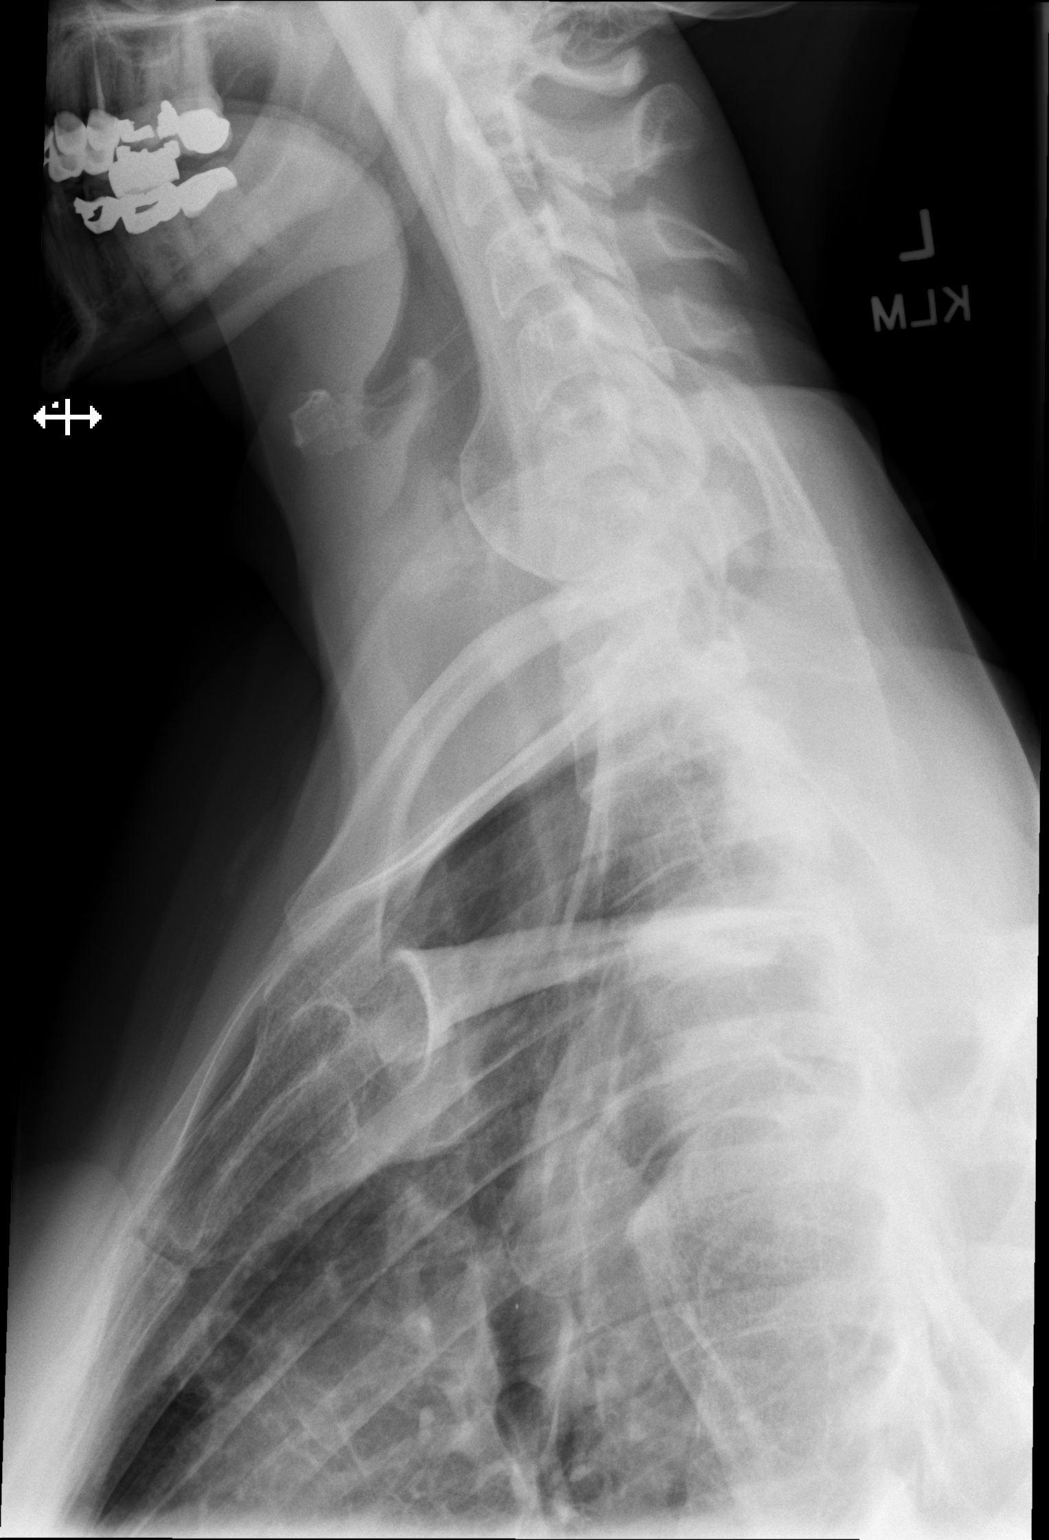

[w thoracic spine lat]
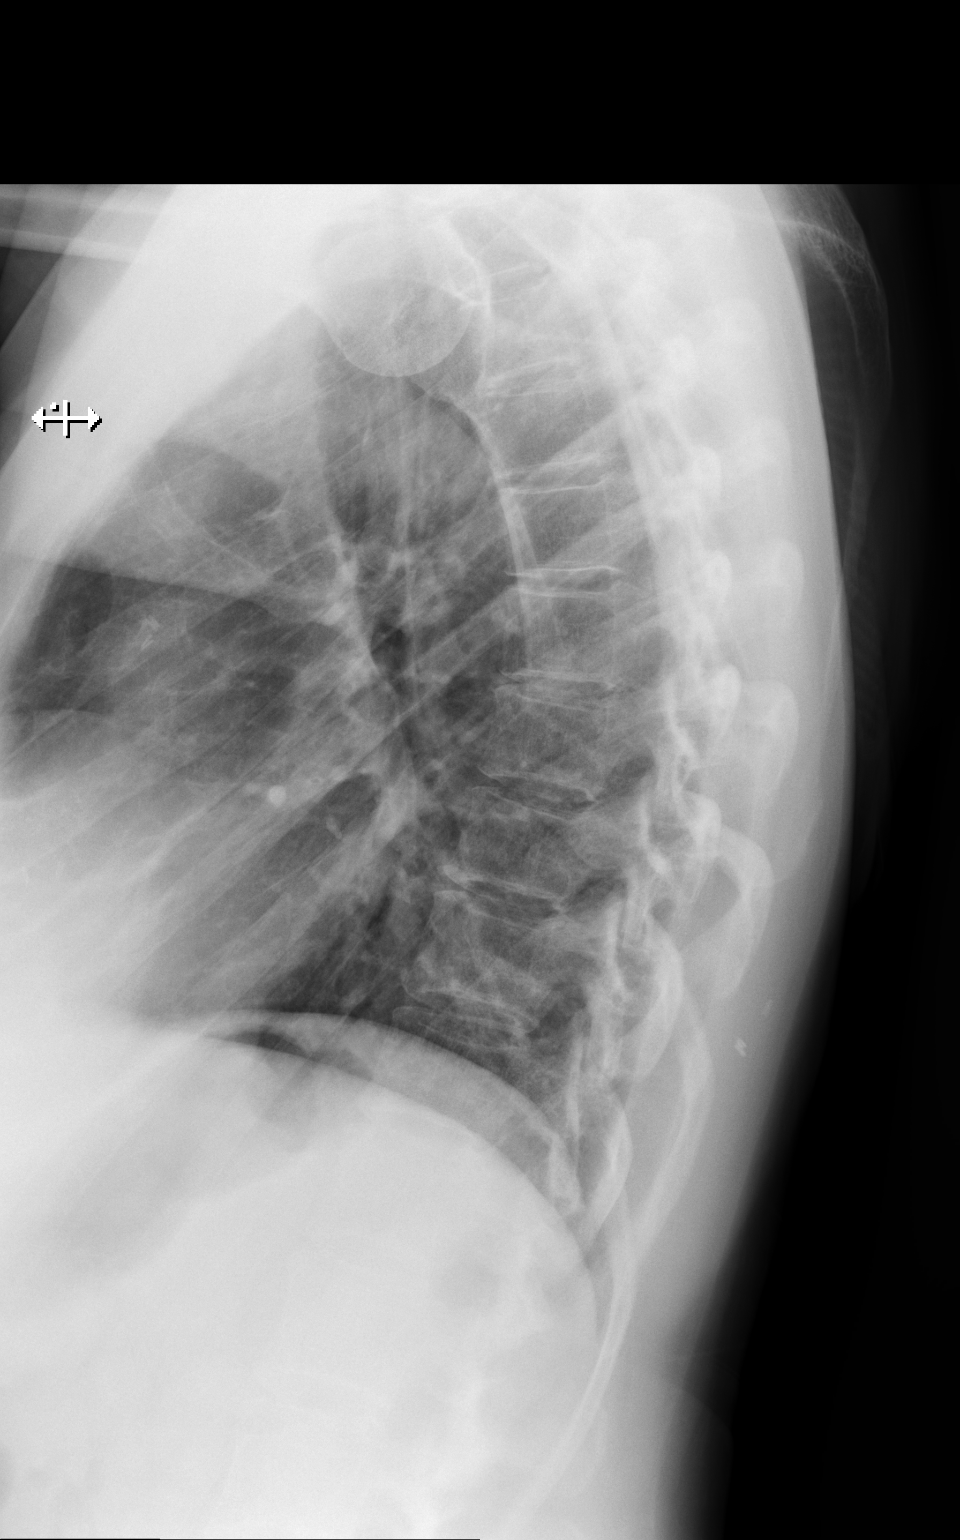

[w thoracic spine ap]
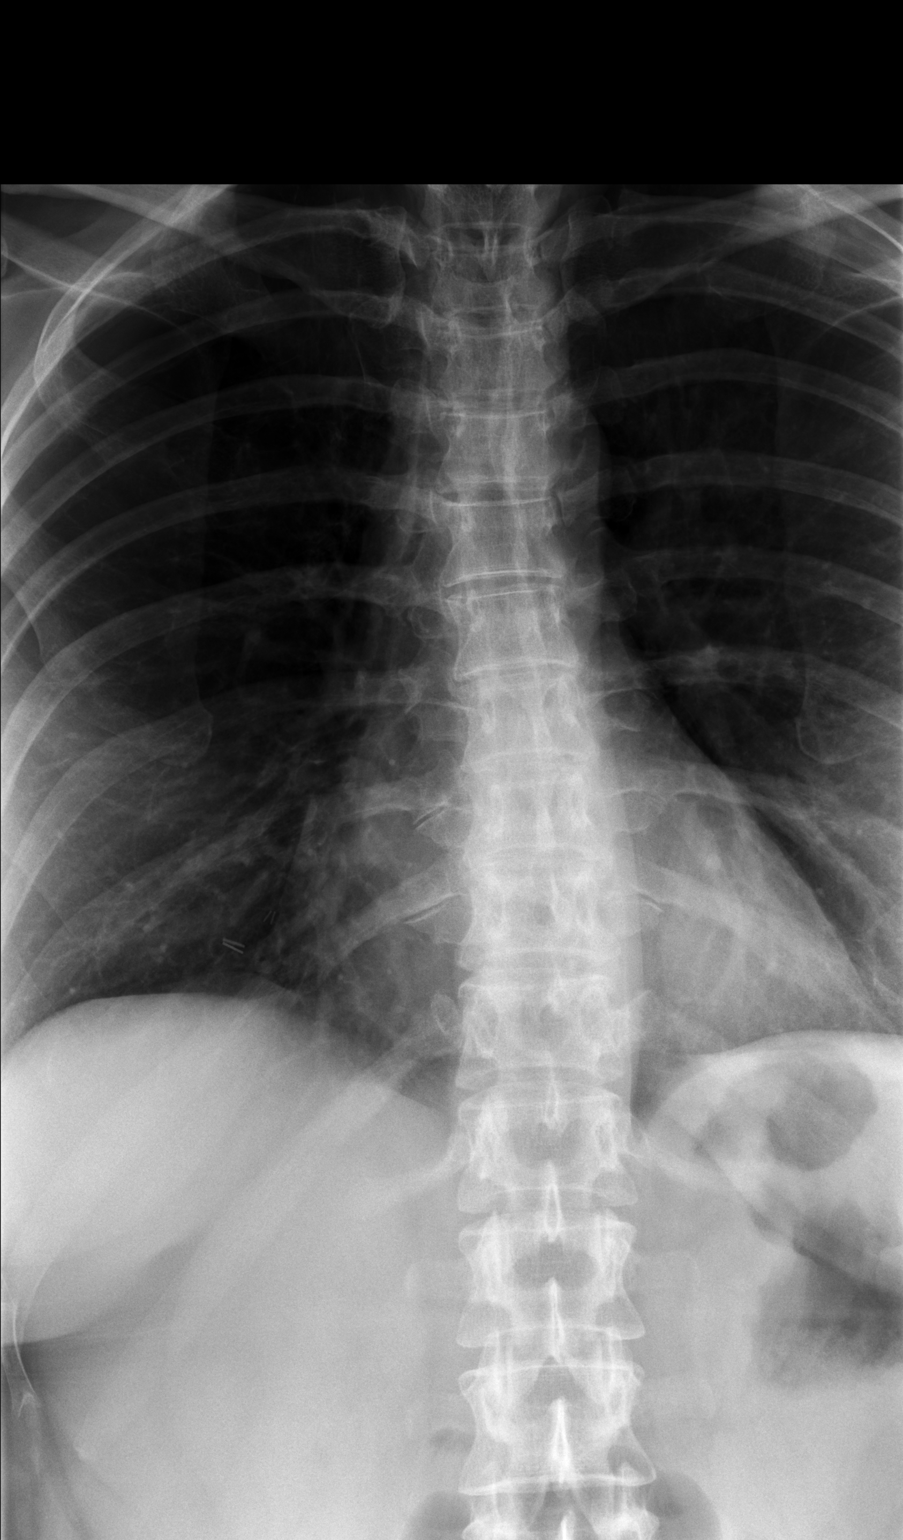

[3 of 3 positions shown; findings below may reference images not displayed]

FINDINGS: There are 12 rib-bearing thoracic type vertebral bodies with small
ribs at T12. The alignment is stable with a mild scoliosis. No
evidence of acute fracture, paraspinal hematoma or widening of the
interpedicular distance. Mild thoracic spine degenerative changes
are noted.
IMPRESSION: Mild thoracic spondylosis and scoliosis.  No acute osseous findings.

## 2022-08-11 IMAGING — CR DG CERVICAL SPINE COMPLETE 4+V
6 series · 6 of 6 positions shown · non-contrast
Comparison: Radiographs 01/25/2017.

CLINICAL DATA: One month history of cervical and thoracic pain. No
known injury.

EXAM:
CERVICAL SPINE - COMPLETE 4+ VIEW

[w cervical spine lat]
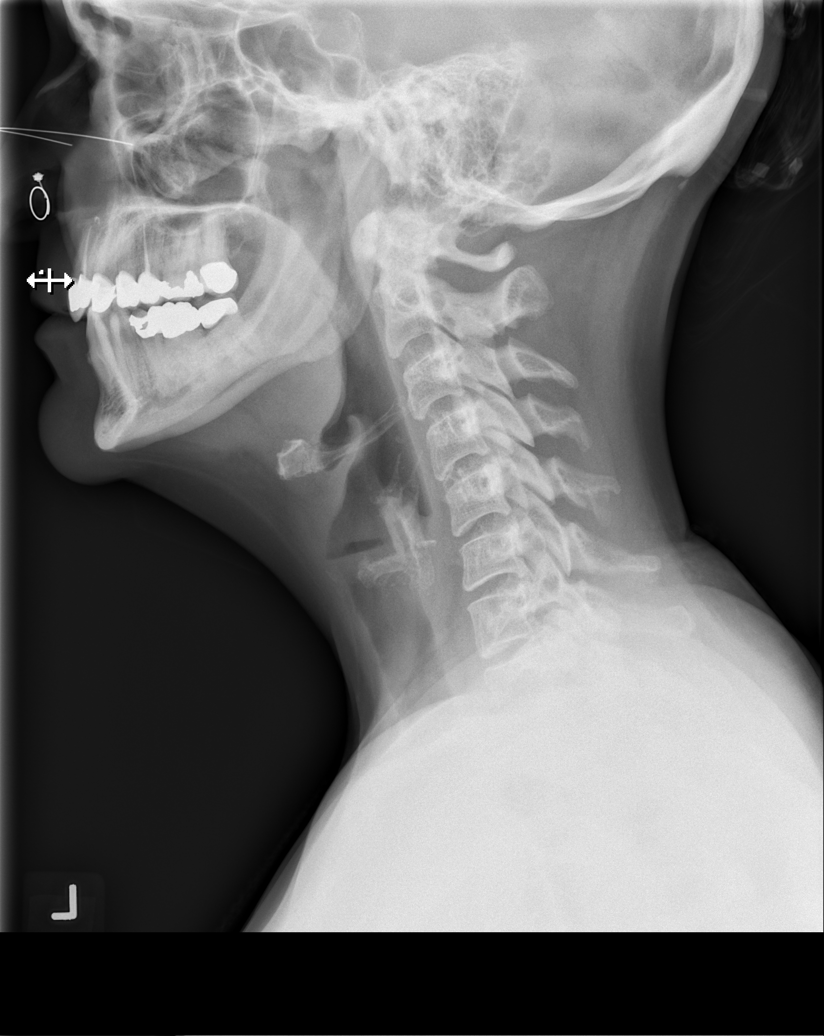

[w cervical spine ap_obl (1 of 2)]
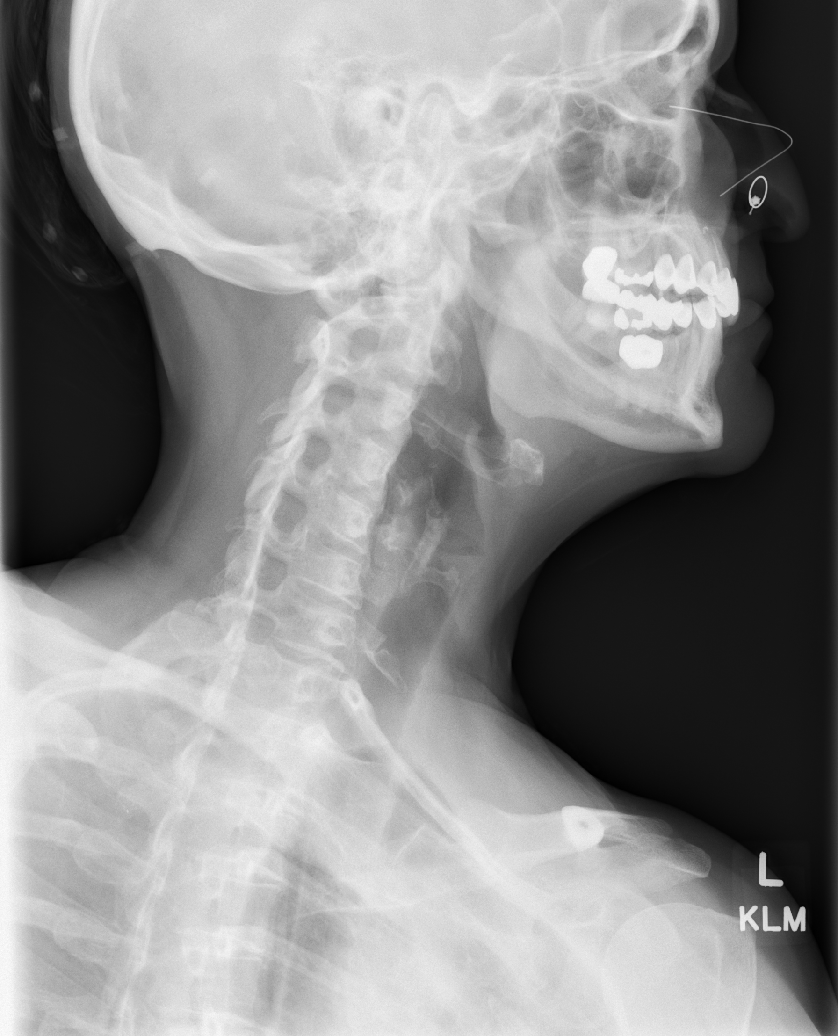

[w cervical spine ap_obl (2 of 2)]
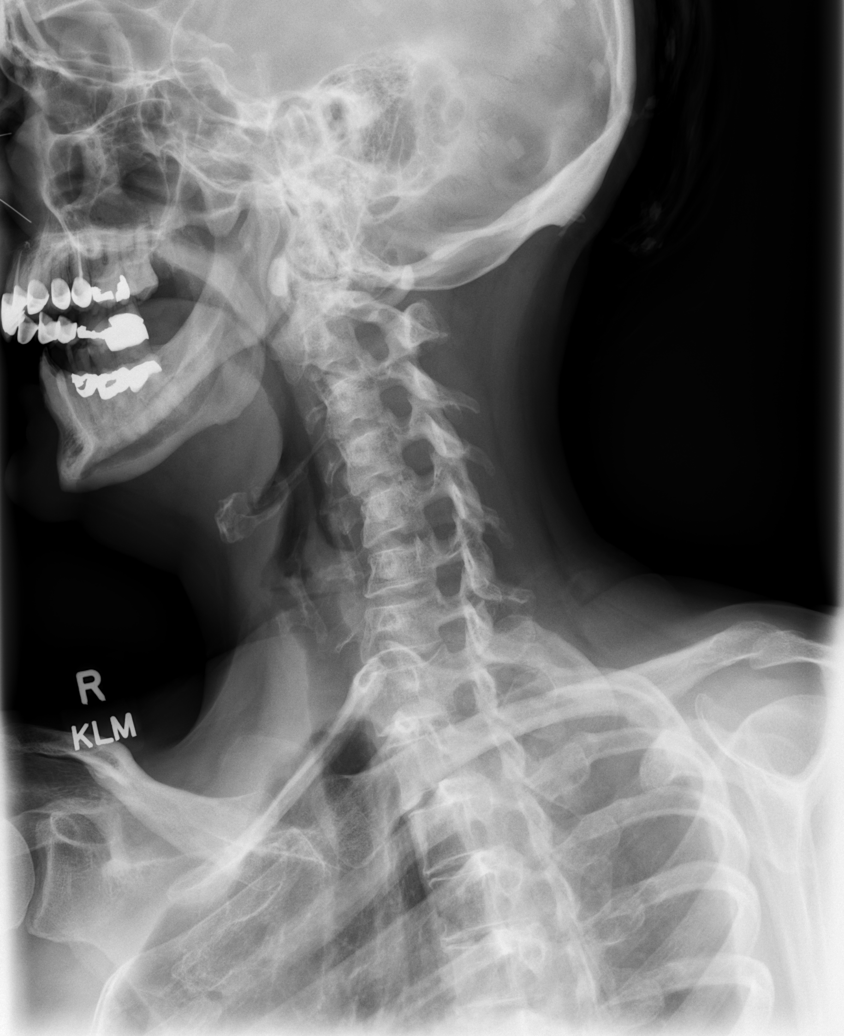

[w cervical spine ap]
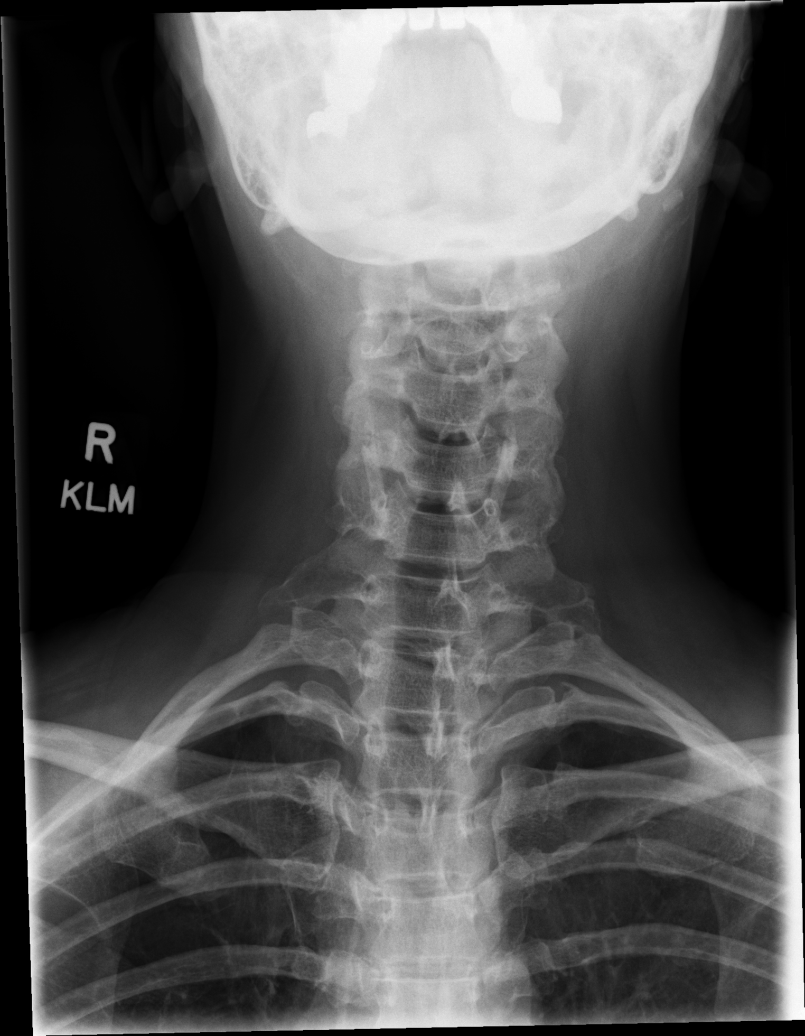

[w cervical spine odontoid (1 of 2)]
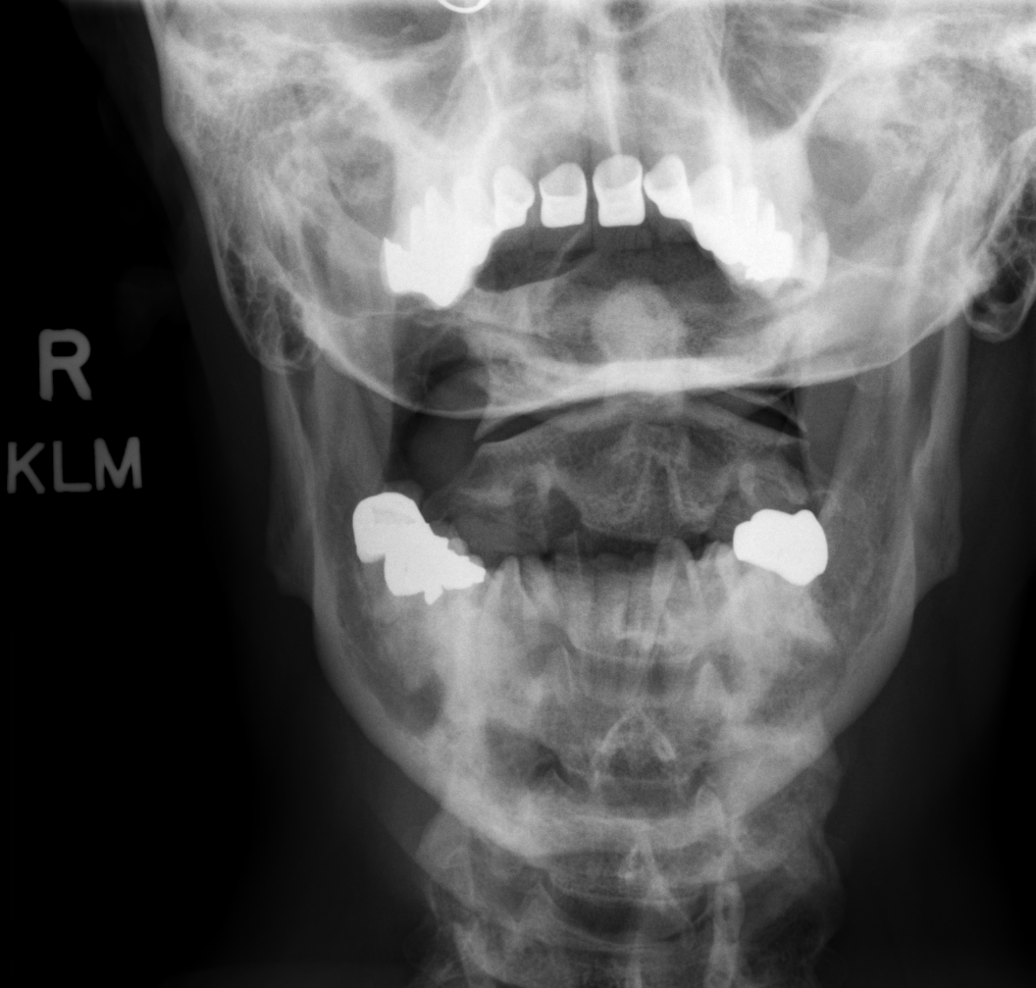

[w cervical spine odontoid (2 of 2)]
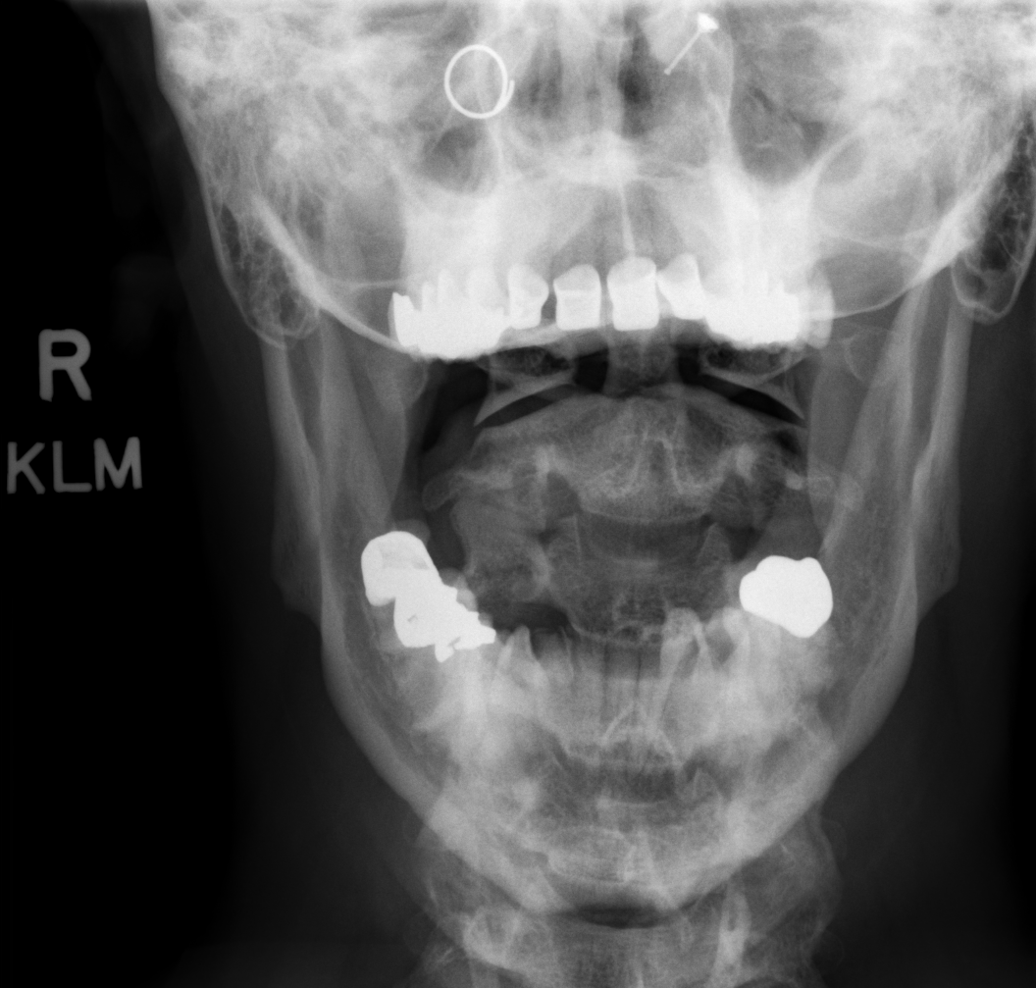

[6 of 6 positions shown; findings below may reference images not displayed]

FINDINGS: There is mild straightening of the usual cervical lordosis, similar
to previous study. The prevertebral soft tissues are normal. There
is no evidence of acute fracture or traumatic subluxation. The disc
spaces are preserved. There is no significant osseous foraminal
narrowing.
IMPRESSION: Stable examination. No acute osseous findings or significant
spondylosis.

## 2022-08-12 NOTE — Telephone Encounter (Signed)
If blood pressures are indeed that low, my recommendation will be to hold valsartan for a couple days, then if pressures normalized would restart at 80 mg daily.  (1/2 tablet).  If pressures are below 100, would hold regardless.  Glenetta Hew, MD

## 2022-08-16 ENCOUNTER — Ambulatory Visit: Payer: BC Managed Care – PPO | Admitting: Orthopaedic Surgery

## 2022-08-16 ENCOUNTER — Encounter: Payer: Self-pay | Admitting: Physical Therapy

## 2022-08-16 ENCOUNTER — Ambulatory Visit: Payer: BC Managed Care – PPO | Admitting: Physical Therapy

## 2022-08-16 DIAGNOSIS — R293 Abnormal posture: Secondary | ICD-10-CM

## 2022-08-16 DIAGNOSIS — M546 Pain in thoracic spine: Secondary | ICD-10-CM

## 2022-08-16 DIAGNOSIS — R262 Difficulty in walking, not elsewhere classified: Secondary | ICD-10-CM | POA: Diagnosis not present

## 2022-08-16 DIAGNOSIS — M542 Cervicalgia: Secondary | ICD-10-CM | POA: Diagnosis not present

## 2022-08-16 DIAGNOSIS — M6281 Muscle weakness (generalized): Secondary | ICD-10-CM

## 2022-08-16 NOTE — Therapy (Signed)
OUTPATIENT PHYSICAL THERAPY TREATMENT NOTE DISCHARGE SUMMARY   Patient Name: Lisa Waters MRN: [id number] DOB:September 01, 1980, 42 y.o., female Today's Date: 08/16/2022  END OF SESSION:   PT End of Session - 08/16/22 1011     Visit Number 4    Number of Visits --    PT Start Time 8099    PT Stop Time 1040    PT Time Calculation (min) 25 min    Activity Tolerance Patient tolerated treatment well;No increased pain;Patient limited by fatigue    Behavior During Therapy Lisa Waters for tasks assessed/performed               Past Medical History:  Diagnosis Date   Anxiety    Asthma    mild intermittent as of 07/2017 - just when patient gets sick   Depression    Female infertility    GERD (gastroesophageal reflux disease)    Hx - no current problems, no meds   H/O amaurosis fugax Fall 2012   Resolved, no current problems, Hx-MRI of brain/brain stem: no acute abnormality, small area of encephalomalacia left superior cerebellum that could be prior trauma, infection, or lacunar infarct   History of Hodgkin's lymphoma 2007   in remission, sees Lisa Waters, prior with Lisa Waters; prior chemotherapy and radiation therapy; sees yearly in December, no current problems   Hyperlipidemia 07/2017   diet control, no meds   Hypothyroidism    Moderate mitral regurgitation by prior echocardiogram 09/2010   10/2015: EF 55-60%. Normal WM. Normal D Fxn. Bilateral MVP w/ Mod MR (very eccentric). Normal LA, RV & RA. Normal PAP.   MVP (mitral valve prolapse) 09/2010   Lisa Waters, [facility]; a) Echo 3/'12: Mod MR; EF 60-65%; b) TEE 5/'12: Nl LV Fxn, EF 60-65%, mild, holosystolic prolapse of the medial anterior leaflet. Mod MR . c) Echo 3/'15: Moderate, holosystolic prolapse of  medial. Mod MR d) TM Stress Echo (@ [place]) Nl Lv Fxn, Mild-Mod MR @ Res0 --> Mod MR at HR 181 bpm (7:22 min, 10.10 METS) w/p WMA   Oral ulcer    Palpitations 10/2012   holter monitor, Lisa Waters; no arrhythmia, no current problems   PONV  (postoperative nausea and vomiting)    Past Surgical History:  Procedure Laterality Date   ANTERIOR CERVICAL DECOMP/DISCECTOMY FUSION N/A 09/09/2021   Procedure: C6-7 ANTERIOR CERVICAL DISCECTOMY FUSION, ALLOGRAFT, PLATE;  Surgeon: Lisa Killings, MD;  Location: [facility];  Service: Orthopedics;  Laterality: N/A;   [place] SURGERY  2002   COLONOSCOPY  07/2014   Dr. Collene Waters - normal   DILATATION & CURETTAGE/HYSTEROSCOPY WITH MYOSURE N/A 01/29/2018   Procedure: DILATATION & CURETTAGE/HYSTEROSCOPY WITH MYOSURE POLYPECTOMY;  Surgeon: Lisa Bigger, MD;  Location: [facility];  Service: Gynecology;  Laterality: N/A;   ESOPHAGOGASTRODUODENOSCOPY  04/11/2013   Lisa Waters Dr. Collene Waters   FOOT SURGERY Bilateral    on toes   INSERTION CENTRAL VENOUS ACCESS DEVICE W/ SUBCUTANEOUS PORT  2006   LYMPH NODE BIOPSY  2006   under right arm   MASTECTOMY Bilateral 02/2020   double mastectomy. Has had 9 surgical procedures on breast since then.   NM MYOCAR PERF WALL MOTION  12/2010   persantine myoview - moderate breast attenuation (fixed mid-distal anterior defect), EF 68%, low risk scan   RHINOPLASTY  2007   deviated septum   TEE WITHOUT CARDIOVERSION  11/2010   Nl LV Fxn, EF 60-65%, mild, holosystolic prolapse of the medial anterior leaflet.  Mod MR .    TRANSTHORACIC ECHOCARDIOGRAM  3/'14; 3/'15   LVEF 60-65%, wall motion normal, LV function normal, moderate holosystolic MVP (medial the middle scallop of the posterior leaflet), moderate regurgitation (directed posteriorly), no shunt -- stable over 2 years   TRANSTHORACIC ECHOCARDIOGRAM  11/03/2021   EF 60 to 65%.  Normal LV size and function.  Normal diastolic parameters.  Normal RV size and function.  Normal aortic valve.  Normal RAP.  Myxomatous mitral valve with anterior leaflet prolapse, moderate MR-posteriorly directed eccentric MR jet:   TRANSTHORACIC ECHOCARDIOGRAM  10/22/2019   EF 55 to 60%.  Mild LVH.  GRII  DD.  Elevated LAP.  Mild LA dilation.  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  Normal aortic valve.  (Stable   WISDOM TOOTH EXTRACTION     Patient Active Problem List   Diagnosis Date Noted   History of fusion of cervical spine 06/28/2022   Bilateral hand numbness 06/14/2022   Preoperative cardiovascular examination 02/05/2022   History of cervical discectomy 11/01/2021   Cervical spinal stenosis 09/09/2021   Hyperlipidemia LDL goal <100 08/31/2020   Essential hypertension 08/30/2020   Vitamin D deficiency 11/12/2018   Acute non-recurrent maxillary sinusitis 12/07/2017   Vaccine counseling 07/26/2017   Routine general medical examination at a health care facility 05/30/2016   Other specified hypothyroidism 04/01/2015   Major depression, recurrent ([place]) 05/25/2013   Allergic rhinitis 01/20/2013   Female infertility of other specified origin 04/09/2012   Raynauds disease 06/29/2011   Hodgkin's disease in remission ([place]) 12/29/2010   MVP (mitral valve prolapse) 12/29/2010     THERAPY DIAG:  Abnormal posture  Difficulty in walking, not elsewhere classified  Pain in thoracic spine  Cervicalgia  Muscle weakness (generalized)   PCP: Lisa Lung, MD   REFERRING PROVIDER: [name], MD   REFERRING DIAG:  Diagnosis  Z98.1 (ICD-10-CM) - History of fusion of cervical spine      THERAPY DIAG:  Abnormal posture - Plan: PT plan of care cert/re-cert   Difficulty in walking, not elsewhere classified - Plan: PT plan of care cert/re-cert   Cervicalgia - Plan: PT plan of care cert/re-cert   Rationale for Evaluation and Treatment: Rehabilitation   ONSET DATE: 09/09/2021   SUBJECTIVE:                                                                                                                                                                                                  SUBJECTIVE STATEMENT: Feels much better, occasional episodes but much better.  BP is still low.  EVAL:  Lisa Waters had  cancer 12 years ago with chemo and radiation.  One of her complications with the cancer treatment was spine pain.  She had ACDF C6-7 on 09/09/2021.  She wore a neck brace after surgery for 6 weeks and appeared to do well up until September 2023.  In September 2023, she started having low back pain.  Now she is having neck and Lt arm/hand pain intermittently.  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   PERTINENT HISTORY:  Anxiety, asthma, depression, h/o amaurosis fugas, Hodgkin's Lymphoma, hypothoidism, hyperlipidemia, HTN, MVP, bilateral foot surgery, Raynauds disease   PAIN:  Are you having pain? Yes: NPRS scale: This week neck and L UE 1-4/10 and low back Lt leg to the toes 0-3/10, not really left leg anymore Pain location: Neck, Lt UE, back and Lt LE Pain description: Occasional "jolt" to the scapula and constant tingling in the fingers Aggravating factors: Prolonged postures, difficulty sleeping Relieving factors: Nothing yet   PRECAUTIONS: Other: General postural precautions for someone post ACDF   WEIGHT BEARING RESTRICTIONS: No   FALLS:  Has patient fallen in last 6 months? 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  Upper extremity symptoms are present most of the time whereas lower extremity symptoms are extremely intermittent.   POSTURE: rounded shoulders, forward  head, and decreased lumbar lordosis   CERVICAL ROM:    Active ROM A/PROM (deg) eval AROM 1/?/2023  Lumbar Extension  10   Cervical Extension 35 45  Cervical Right lateral flexion     Cervical Left lateral flexion     Cervical Right rotation 30 60  Cervical Left rotation 35 58   (Blank rows = not tested)     STRENGTH:   In Pounds assessed with hand-held dynamometer 07/06/2022 07/26/2022  08/16/22  Shoulder flexion       Shoulder extension       Shoulder abduction       Shoulder adduction       Shoulder extension       Shoulder internal rotation       Shoulder external rotation       Middle trapezius       Lower trapezius       Cervical lateral flexion 4.1/4.1  15.1/16.4 Rt: 27.7/Lt: 20.8  Cervical extension 13.8  29.0 30.9  Wrist flexion       Wrist extension       Wrist ulnar deviation       Wrist radial deviation       Wrist pronation       Wrist supination       Grip strength        (Blank rows = not tested)   TODAY'S TREATMENT:  DATE:  08/16/22 TherEx AROM and MMT measurements as noted above Discussed current HEP and continued exercise - pt with excellent understanding of continued exercise  Self-Care Discussed current BP issues, which pt states continues to be low, discussed ways to manage orthostatic hypotension as well as monitoring and continuing to follow up with MD  07/26/2022 Shoulder blade pinches 10X 5 seconds Cervical Isometrics Extension 10X 5 seconds Rows/Pull to chest theraband 20X Blue Shoulder ER theraband Green 10X Bil Shoulder extension theraband Red palms up 10X Prone 90 degrees chin in neutral 2 sets of 10X 3 seconds Trunk extension AROM 10X 3 seconds Prone 90 degrees (prone T) 2 sets of 10X 3 seconds   07/12/22 TherEx Shoulder blade pinches 10 x 5 seconds Cervical extension isometrics 10 x 5 seconds Trunk  extension active range of motion 10 x 3 seconds Trial of wall and counter push ups for scapular stabilization - better control on wall Trial of quadruped weight bearing with hip extension for increased scapular stabilization Supine serratus punch 5# 2x10 bil Supine shoulder circles in 90 deg flexion x 20 rep CW/CWW bil; 5#  Manual IASTM with percussive device to Lt shoulder girdle in prone, twitch responses noted in infraspinatus   07/06/2022 Shoulder blade pinches 10 x 5 seconds Cervical extension isometrics 10 x 5 seconds Trunk extension active range of motion 10 x 3 seconds   Functional activities: Reviewed spine anatomy, posture, the importance of changing position frequently, correct lumbar roll use and prescribed a home walking and exercise program   PATIENT EDUCATION:  Education details: See above Person educated: Patient Education method: Explanation, Demonstration, Tactile cues, Verbal cues, and Handouts Education comprehension: verbalized understanding, returned demonstration, verbal cues required, tactile cues required, and needs further education   HOME EXERCISE PROGRAM: Access Code: [email or web address] URL: [email or web address] Date: 07/26/2022 Prepared by: Vista Mink  Exercises - Standing Scapular Retraction  - 5 x daily - 7 x weekly - 1 sets - 5 reps - 5 second hold - Standing Isometric Cervical Extension with Manual Resistance  - 5 x daily - 7 x weekly - 1 sets - 5 reps - 5 hold - Standing Lumbar Extension at [place]  - 5 x daily - 7 x weekly - 1 sets - 5 reps - 3 seconds hold - Wall Push Up  - 1 x daily - 1-7 x weekly - 1 sets - 5-10 reps - Quadruped Alternating Leg Extensions  - 1 x daily - 1-7 x weekly - 1 sets - 5-10 reps - Supine Scapular Protraction in Flexion with Dumbbells  - 1 x daily - 7 x weekly - 1 sets - 20 reps - Prone Alternating Arm and Leg Lifts  - 1 x daily - 7 x weekly - 2 sets - 10 reps - 3-10 seconds hold - Prone Shoulder  Horizontal Abduction with Thumbs Up  - 1 x daily - 7 x weekly - 2 sets - 10 reps - 3 seconds hold - Shoulder extension with resistance - Neutral  - 1 x daily - 7 x weekly - 1-2 sets - 10 reps - 3 hold   ASSESSMENT:   CLINICAL IMPRESSION: Pt has met all goals except cervical extension strength goal, and feel this will continue to improve with continued exercise.  Will d/c PT today.   OBJECTIVE IMPAIRMENTS: cardiopulmonary status limiting activity, decreased activity tolerance, decreased endurance, decreased knowledge of condition, difficulty walking, decreased ROM, decreased strength, decreased safety awareness, impaired perceived functional ability, increased muscle spasms,  impaired UE functional use, improper body mechanics, postural dysfunction, obesity, and pain.    ACTIVITY LIMITATIONS: carrying, lifting, bending, sitting, standing, squatting, sleeping, stairs, bed mobility, reach over head, and locomotion level   PARTICIPATION LIMITATIONS: interpersonal relationship, community activity, and occupation   PERSONAL FACTORS: Anxiety, asthma, depression, h/o amaurosis fugas, Hodgkin's Lymphoma, hypothoidism, hyperlipidemia, HTN, MVP, bilateral foot surgery, Raynauds disease are also affecting patient's functional outcome.    REHAB POTENTIAL: Good   CLINICAL DECISION MAKING: Stable/uncomplicated   EVALUATION COMPLEXITY: Low     GOALS: Goals reviewed with patient? Yes   SHORT TERM GOALS: Target date: 08/03/2022   Araseli will improve her cervical strength for extension to at least 25 pounds and lateral bending to at least 12 pounds Baseline: ~ 13 and ~ 4 pounds respectively Goal status: Met 07/26/2022   2.  Korrine will have improved postural awareness and will be able to implement this into all static and dynamic activities Baseline: Just started addressing at evaluation 07/06/2022 Goal status: Met 08/16/22   3.  Chayce will be independent with her beginner starting HEP Baseline: Started  07/06/2022 Goal status: Met 07/26/2022     LONG TERM GOALS: Target date: 08/31/2022   Improve FOTO to 60 Baseline: 50 Goal status: Met 08/16/22   2.  Improve cervical and low back pain to consistently 0-3 out of 10 on the visual analog scale with no complaints of upper or lower extremity peripheral symptoms Baseline: Can be as high as 7 out of 10 with left upper and lower extremity peripheral symptoms Goal status: Met 08/16/22   3.  Improve cervical extension active range of motion to 40 degrees and rotation to 45 degrees Baseline: 35 and 35/30 respectively Goal status: Met 08/16/22   4.  Larkin will improve her cervical strength for extension to 40 pounds and lateral bending to 20 pounds Baseline: ~13 and ~ 4 pounds respectively Goal status: Partially Met 08/16/22   5.  Vendela will return to walking at least a mile at a time 3 or more times a week at discharge Baseline: None at evaluation Goal status: Met 07/26/2022   6.  Alayla will be independent with a long-term home exercise program at discharge Baseline: Started 07/06/2022 Goal status: Met 08/16/22     PLAN:   PT FREQUENCY: 1-2x/week   PT DURATION: 8 weeks   PLANNED INTERVENTIONS: Therapeutic exercises, Therapeutic activity, Neuromuscular re-education, Balance training, Gait training, Patient/Family education, Self Care, Joint mobilization, Stair training, Cryotherapy, Moist heat, and Manual therapy   PLAN FOR NEXT SESSION: d/c PT today   Laureen Abrahams, PT, DPT 08/16/22 10:50 AM      PHYSICAL THERAPY DISCHARGE SUMMARY  Visits from Start of Care: 4  Current functional level related to goals / functional outcomes: See above   Remaining deficits: See above   Education / Equipment: HEP   Patient agrees to discharge. Patient goals were partially met. Patient is being discharged due to being pleased with the current functional level.   Laureen Abrahams, PT, DPT 08/16/22 10:50 AM  [facility] [address] [place], [place], [address] Phone: [phone]   Fax:  [phone]

## 2022-08-18 NOTE — Telephone Encounter (Signed)
What I would do is hold Toprol for for 1 day and take 1/2 tablet at night for couple days, then every other day for 4 days, then stop.  Make sure you are drinking plenty of water and liberalize salt for the next week.  Glenetta Hew, MD

## 2022-08-23 ENCOUNTER — Ambulatory Visit: Payer: BC Managed Care – PPO | Admitting: Orthopaedic Surgery

## 2022-08-23 ENCOUNTER — Ambulatory Visit (INDEPENDENT_AMBULATORY_CARE_PROVIDER_SITE_OTHER): Payer: BC Managed Care – PPO

## 2022-08-23 ENCOUNTER — Encounter: Payer: Self-pay | Admitting: Orthopaedic Surgery

## 2022-08-23 VITALS — BP 146/83 | HR 118 | Ht 65.0 in | Wt 175.0 lb

## 2022-08-23 DIAGNOSIS — M79672 Pain in left foot: Secondary | ICD-10-CM | POA: Diagnosis not present

## 2022-08-23 NOTE — Progress Notes (Unsigned)
Office Visit Note   Patient: Lisa Waters           Date of Birth: Sep 21, 1980           MRN: [id number] Visit Date: 08/23/2022              Requested by: Denita Lung, MD [place],  [place] [address] PCP: Denita Lung, MD   Assessment & Plan: Visit Diagnoses:  1. Pain in left foot     Plan: X-rays are negative for fracture and she can progressively weight-bear as tolerated elevation ibuprofen Tylenol.  Return as needed.  Follow-Up Instructions: Return if symptoms worsen or fail to improve.   Orders:  Orders Placed This Encounter  Procedures   XR Foot Complete Left   No orders of the defined types were placed in this encounter.     Procedures: No procedures performed   Clinical Data: No additional findings.   Subjective: Chief Complaint  Patient presents with   Neck - Follow-up   Lower Back - Follow-up   Left Foot - Pain    HPI 42 year old female returns neck and back to be doing well she had a neck fusion C6-7 1 year ago.  She dropped a board of lumber onto her foot and had a left foot bruising pain in the forefoot over the second third and fourth metatarsal shaft region.  She has been ambulating with a limp.  No previous injury to the foot.  Review of Systems all other systems noncontributory to HPI.   Objective: Vital Signs: BP (!) 146/83   Pulse (!) 118   Ht '5\' 5"'$  (1.651 m)   Wt 175 lb (79.4 kg)   BMI 29.12 kg/m   Physical Exam Constitutional:      Appearance: She is well-developed.  HENT:     Head: Normocephalic.     Right Ear: External ear normal.     Left Ear: External ear normal. There is no impacted cerumen.  Eyes:     Pupils: Pupils are equal, round, and reactive to light.  Neck:     Thyroid: No thyromegaly.     Trachea: No tracheal deviation.  Cardiovascular:     Rate and Rhythm: Normal rate.  Pulmonary:     Effort: Pulmonary effort is normal.  Abdominal:     Palpations: Abdomen is soft.  Musculoskeletal:      Cervical back: No rigidity.  Skin:    General: Skin is warm and dry.  Neurological:     Mental Status: She is alert and oriented to person, place, and time.  Psychiatric:        Behavior: Behavior normal.     Ortho Exam ecchymosis dorsal left foot with tenderness.  No motion noted with palpation over the metatarsal shafts.  Small scar over the small toe from previous surgery.  Specialty Comments:  No specialty comments available.  Imaging: XR Foot Complete Left  Result Date: 08/23/2022 Three-view x-rays left foot obtained no acute fracture seen metatarsals are intact.  Postop changes PIP joint small toe. Impression: Left foot negative for acute injury.  Some dorsal swelling noted over the    PMFS History: Patient Active Problem List   Diagnosis Date Noted   History of fusion of cervical spine 06/28/2022   Bilateral hand numbness 06/14/2022   Preoperative cardiovascular examination 02/05/2022   History of cervical discectomy 11/01/2021   Cervical spinal stenosis 09/09/2021   Hyperlipidemia LDL goal <100 08/31/2020   Essential hypertension 08/30/2020  Vitamin D deficiency 11/12/2018   Acute non-recurrent maxillary sinusitis 12/07/2017   Vaccine counseling 07/26/2017   Routine general medical examination at a health care facility 05/30/2016   Other specified hypothyroidism 04/01/2015   Major depression, recurrent ([place]) 05/25/2013   Allergic rhinitis 01/20/2013   Female infertility of other specified origin 04/09/2012   Raynauds disease 06/29/2011   Hodgkin's disease in remission ([place]) 12/29/2010   MVP (mitral valve prolapse) 12/29/2010   Past Medical History:  Diagnosis Date   Anxiety    Asthma    mild intermittent as of 07/2017 - just when patient gets sick   Depression    Female infertility    GERD (gastroesophageal reflux disease)    Hx - no current problems, no meds   H/O amaurosis fugax Fall 2012   Resolved, no current problems, Hx-MRI of brain/brain stem:  no acute abnormality, small area of encephalomalacia left superior cerebellum that could be prior trauma, infection, or lacunar infarct   History of Hodgkin's lymphoma 2007   in remission, sees [facility], prior with Dr. Elwanda [place]; prior chemotherapy and radiation therapy; sees yearly in December, no current problems   Hyperlipidemia 07/2017   diet control, no meds   Hypothyroidism    Moderate mitral regurgitation by prior echocardiogram 09/2010   10/2015: EF 55-60%. Normal WM. Normal D Fxn. Bilateral MVP w/ Mod MR (very eccentric). Normal LA, RV & RA. Normal PAP.   MVP (mitral valve prolapse) 09/2010   Dr. Ellyn Hack, [facility]; a) Echo 3/'12: Mod MR; EF 60-65%; b) TEE 5/'12: Nl LV Fxn, EF 60-65%, mild, holosystolic prolapse of the medial anterior leaflet. Mod MR . c) Echo 3/'15: Moderate, holosystolic prolapse of  medial. Mod MR d) TM Stress Echo (@ [place]) Nl Lv Fxn, Mild-Mod MR @ Res0 --> Mod MR at HR 181 bpm (7:22 min, 10.10 METS) w/p WMA   Oral ulcer    Palpitations 10/2012   holter monitor, Dr. Ellyn Hack; no arrhythmia, no current problems   PONV (postoperative nausea and vomiting)     Family History  Problem Relation Age of Onset   Skin cancer Father    Cancer Paternal Grandmother        metastasis   Alcohol abuse Mother    Emphysema Paternal Grandfather        was a smoker, heart problems   Diabetes Maternal Aunt    OCD Maternal Aunt    Bipolar disorder Maternal Aunt    Stroke Maternal Grandmother    Cancer Maternal Aunt        skin   Heart disease Neg Hx    Hypertension Neg Hx    Hyperlipidemia Neg Hx     Past Surgical History:  Procedure Laterality Date   ANTERIOR CERVICAL DECOMP/DISCECTOMY FUSION N/A 09/09/2021   Procedure: C6-7 ANTERIOR CERVICAL DISCECTOMY FUSION, ALLOGRAFT, PLATE;  Surgeon: [name], MD;  Location: [facility];  Service: Orthopedics;  Laterality: N/A;   [place] SURGERY  2002   COLONOSCOPY  07/2014   Dr. Collene Mares - normal    DILATATION & CURETTAGE/HYSTEROSCOPY WITH MYOSURE N/A 01/29/2018   Procedure: DILATATION & CURETTAGE/HYSTEROSCOPY WITH MYOSURE POLYPECTOMY;  Surgeon: Charyl Bigger, MD;  Location: [facility];  Service: Gynecology;  Laterality: N/A;   ESOPHAGOGASTRODUODENOSCOPY  04/11/2013   [facility] Dr. Collene Mares   FOOT SURGERY Bilateral    on toes   INSERTION CENTRAL VENOUS ACCESS DEVICE W/ SUBCUTANEOUS PORT  2006   LYMPH NODE BIOPSY  2006  under right arm   MASTECTOMY Bilateral 02/2020   double mastectomy. Has had 9 surgical procedures on breast since then.   NM MYOCAR PERF WALL MOTION  12/2010   persantine myoview - moderate breast attenuation (fixed mid-distal anterior defect), EF 68%, low risk scan   RHINOPLASTY  2007   deviated septum   TEE WITHOUT CARDIOVERSION  11/2010   Nl LV Fxn, EF 60-65%, mild, holosystolic prolapse of the medial anterior leaflet. Mod MR .    TRANSTHORACIC ECHOCARDIOGRAM  3/'14; 3/'15   LVEF 60-65%, wall motion normal, LV function normal, moderate holosystolic MVP (medial the middle scallop of the posterior leaflet), moderate regurgitation (directed posteriorly), no shunt -- stable over 2 years   TRANSTHORACIC ECHOCARDIOGRAM  11/03/2021   EF 60 to 65%.  Normal LV size and function.  Normal diastolic parameters.  Normal RV size and function.  Normal aortic valve.  Normal RAP.  Myxomatous mitral valve with anterior leaflet prolapse, moderate MR-posteriorly directed eccentric MR jet:   TRANSTHORACIC ECHOCARDIOGRAM  10/22/2019   EF 55 to 60%.  Mild LVH.  GRII DD.  Elevated LAP.  Mild LA dilation.  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  Normal aortic valve.  (Stable   WISDOM TOOTH EXTRACTION     Social History   Occupational History   Occupation: works in Engineer, production: [employer]  Tobacco Use   Smoking status: Former    Packs/day: 1.00    Years: 10.00    Total pack years: 10.00    Types: Cigarettes    Quit date: 11/07/2009    Years since quitting: 12.8    Smokeless tobacco: Never  Vaping Use   Vaping Use: Never used  Substance and Sexual Activity   Alcohol use: Yes    Alcohol/week: 1.0 - 2.0 standard drink of alcohol    Types: 1 - 2 Cans of beer per week    Comment: social Beer/Liquor   Drug use: No   Sexual activity: Yes    Partners: Male    Birth control/protection: None

## 2022-08-29 NOTE — Telephone Encounter (Signed)
Sounds like she probably needs to come in just for [place] to confirm her blood pressures and see was going on.  She may require some midodrine to help her blood pressures come up.  I am not sure how she went for a walk with a blood pressure that low.  Concerned that maybe her blood pressure cuff is not recording right.  When she comes in, she should bring in her cuff and have it calibrated.   Glenetta Hew, MD

## 2022-08-30 NOTE — Telephone Encounter (Signed)
Called patient . Informed patient [name] would like for her to come into the office for evaluation.  Patient is agreeable. Patient isa away to bring blood pressure cuff with her to visit  appointment schedule 09/05/22 at 2:45 pm.   Patient states she is not on any  cardiac medication at present time.  She states I feel fine other than  low blood pressure.  She states she is on a diet now - - drink a gallon of water at day , do ( 2 ) forty minute work out daily  and eat about 1800 calories a day.  Patent verbalized understanding

## 2022-09-05 ENCOUNTER — Encounter: Payer: Self-pay | Admitting: Nurse Practitioner

## 2022-09-05 ENCOUNTER — Ambulatory Visit: Payer: BC Managed Care – PPO | Attending: Nurse Practitioner | Admitting: Nurse Practitioner

## 2022-09-05 VITALS — BP 136/72 | HR 93 | Ht 67.0 in | Wt 171.4 lb

## 2022-09-05 DIAGNOSIS — I340 Nonrheumatic mitral (valve) insufficiency: Secondary | ICD-10-CM | POA: Diagnosis not present

## 2022-09-05 DIAGNOSIS — R42 Dizziness and giddiness: Secondary | ICD-10-CM

## 2022-09-05 DIAGNOSIS — I959 Hypotension, unspecified: Secondary | ICD-10-CM

## 2022-09-05 DIAGNOSIS — I341 Nonrheumatic mitral (valve) prolapse: Secondary | ICD-10-CM

## 2022-09-05 DIAGNOSIS — E785 Hyperlipidemia, unspecified: Secondary | ICD-10-CM

## 2022-09-05 DIAGNOSIS — I10 Essential (primary) hypertension: Secondary | ICD-10-CM

## 2022-09-05 DIAGNOSIS — R002 Palpitations: Secondary | ICD-10-CM | POA: Diagnosis not present

## 2022-09-05 NOTE — Patient Instructions (Signed)
Medication Instructions:  Your physician recommends that you continue on your current medications as directed. Please refer to the Current Medication list given to you today.   *If you need a refill on your cardiac medications before your next appointment, please call your pharmacy*   Lab Work: NONE ordered at this time of appointment   If you have labs (blood work) drawn today and your tests are completely normal, you will receive your results only by: MyChart Message (if you have MyChart) OR A paper copy in the mail If you have any lab test that is abnormal or we need to change your treatment, we will call you to review the results.   Testing/Procedures: NONE ordered at this time of appointment     Follow-Up: At [facility], you and your health needs are our priority.  As part of our continuing mission to provide you with exceptional heart care, we have created designated Provider Care Teams.  These Care Teams include your primary Cardiologist (physician) and Advanced Practice Providers (APPs -  Physician Assistants and Nurse Practitioners) who all work together to provide you with the care you need, when you need it.  We recommend signing up for the patient portal called "MyChart".  Sign up information is provided on this After Visit Summary.  MyChart is used to connect with patients for Virtual Visits (Telemedicine).  Patients are able to view lab/test results, encounter notes, upcoming appointments, etc.  Non-urgent messages can be sent to your provider as well.   To learn more about what you can do with MyChart, go to [email or web address].    Your next appointment:   3 month(s)  Provider:   Emily Monge, NP        Other Instructions   

## 2022-09-05 NOTE — Progress Notes (Unsigned)
Office Visit    Patient Name: Lisa Waters Date of Encounter: 09/05/2022  Primary Care Provider:  Denita Lung, MD Primary Cardiologist:  Glenetta Hew, MD  Chief Complaint    42 year old female with a history of palpitations, moderate mitral valve regurgitation with mitral valve prolapse, hypertension, hyperlipidemia, breast cancer s/p bilateral mastectomy, and Raynaud's phenomenon who presents for follow-up related to hypertension.  Past Medical History    Past Medical History:  Diagnosis Date   Anxiety    Asthma    mild intermittent as of 07/2017 - just when patient gets sick   Depression    Female infertility    GERD (gastroesophageal reflux disease)    Hx - no current problems, no meds   H/O amaurosis [place] Fall 2012   Resolved, no current problems, Hx-MRI of brain/brain stem: no acute abnormality, small area of encephalomalacia left superior cerebellum that could be prior trauma, infection, or lacunar infarct   History of Hodgkin's lymphoma 2007   in remission, sees [facility], prior with Dr. Elwanda [place]; prior chemotherapy and radiation therapy; sees yearly in December, no current problems   Hyperlipidemia 07/2017   diet control, no meds   Hypothyroidism    Moderate mitral regurgitation by prior echocardiogram 09/2010   10/2015: EF 55-60%. Normal WM. Normal D Fxn. Bilateral MVP w/ Mod MR (very eccentric). Normal LA, RV & RA. Normal PAP.   MVP (mitral valve prolapse) 09/2010   Dr. Ellyn Hack, [facility]; a) Echo 3/'12: Mod MR; EF 60-65%; b) TEE 5/'12: Nl LV Fxn, EF 60-65%, mild, holosystolic prolapse of the medial anterior leaflet. Mod MR . c) Echo 3/'15: Moderate, holosystolic prolapse of  medial. Mod MR d) TM Stress Echo (@ [place]) Nl Lv Fxn, Mild-Mod MR @ Res0 --> Mod MR at HR 181 bpm (7:22 min, 10.10 METS) w/p WMA   Oral ulcer    Palpitations 10/2012   holter monitor, Dr. Ellyn Hack; no arrhythmia, no current problems   PONV (postoperative nausea and vomiting)    Past Surgical  History:  Procedure Laterality Date   ANTERIOR CERVICAL DECOMP/DISCECTOMY FUSION N/A 09/09/2021   Procedure: C6-7 ANTERIOR CERVICAL DISCECTOMY FUSION, ALLOGRAFT, PLATE;  Surgeon: [name], MD;  Location: [facility];  Service: Orthopedics;  Laterality: N/A;   [place] SURGERY  2002   COLONOSCOPY  07/2014   Dr. Collene Mares - normal   DILATATION & CURETTAGE/HYSTEROSCOPY WITH MYOSURE N/A 01/29/2018   Procedure: DILATATION & CURETTAGE/HYSTEROSCOPY WITH MYOSURE POLYPECTOMY;  Surgeon: Charyl Bigger, MD;  Location: [facility];  Service: Gynecology;  Laterality: N/A;   ESOPHAGOGASTRODUODENOSCOPY  04/11/2013   [facility] Dr. Collene Mares   FOOT SURGERY Bilateral    on toes   INSERTION CENTRAL VENOUS ACCESS DEVICE W/ SUBCUTANEOUS PORT  2006   LYMPH NODE BIOPSY  2006   under right arm   MASTECTOMY Bilateral 02/2020   double mastectomy. Has had 9 surgical procedures on breast since then.   NM MYOCAR PERF WALL MOTION  12/2010   persantine myoview - moderate breast attenuation (fixed mid-distal anterior defect), EF 68%, low risk scan   RHINOPLASTY  2007   deviated septum   TEE WITHOUT CARDIOVERSION  11/2010   Nl LV Fxn, EF 60-65%, mild, holosystolic prolapse of the medial anterior leaflet. Mod MR .    TRANSTHORACIC ECHOCARDIOGRAM  3/'14; 3/'15   LVEF 60-65%, wall motion normal, LV function normal, moderate holosystolic MVP (medial the middle scallop of the posterior leaflet), moderate regurgitation (directed posteriorly),  no shunt -- stable over 2 years   TRANSTHORACIC ECHOCARDIOGRAM  11/03/2021   EF 60 to 65%.  Normal LV size and function.  Normal diastolic parameters.  Normal RV size and function.  Normal aortic valve.  Normal RAP.  Myxomatous mitral valve with anterior leaflet prolapse, moderate MR-posteriorly directed eccentric MR jet:   TRANSTHORACIC ECHOCARDIOGRAM  10/22/2019   EF 55 to 60%.  Mild LVH.  GRII DD.  Elevated LAP.  Mild LA dilation.  Myxomatous MV with  moderate MR.  Normal aortic valve.  (Stable   WISDOM TOOTH EXTRACTION      Allergies  Allergies  Allergen Reactions   Clindamycin Nausea And Vomiting    Pt states she took this medication at home and started having heartburn followed by N/V.    Codeine Itching   Fish Allergy Itching    Mahi Mahi   Iohexol Hives   Pindolol     Avoid non selective beta blockers due to dyspnea   Iodinated Contrast Media Rash    Has received since rash developed without pre-meds without development of rash.     Labs/Other Studies Reviewed    The following studies were reviewed today: Echo 10/28/2021: EF 60 to 65%.  Normal LV size and function.  Normal diastolic parameters.  Normal RV size and function.  Normal aortic valve.  Normal RAP.  Myxomatous mitral valve with anterior leaflet prolapse, moderate MR-posteriorly directed eccentric MR jet:  Conclusion(s)/Recommendation(s): Compared to prior study 10/22/2019, MR appears improved.   14-day Zio 03/2019: 14-day ZIO patch monitor Sinus rhythm with very rare PACs and PVCs. Minimum heart rate 70 bpm. Maximum heart rate 171 bpm with average heart rate 96 bpm. No tachycardia or bradycardia arrhythmias noted.   Pretty normal monitor.  Symptoms noted with heart rates greater than 90 bpm.   With these findings, we can consider increasing the diltiazem dose to lower resting heart rate.   Glenetta Hew, MD Recent Labs: 09/06/2021: Hemoglobin 12.1; Platelets 317 11/01/2021: TSH 3.110 01/23/2022: ALT 79; BUN 9; Creatinine, Ser 0.73; Potassium 4.3; Sodium 137  Recent Lipid Panel    Component Value Date/Time   CHOL 282 (H) 01/23/2022 1104   TRIG 86 01/23/2022 1104   HDL 117 01/23/2022 1104   CHOLHDL 2.4 01/23/2022 1104   CHOLHDL 2.5 07/26/2017 1128   VLDL 11 04/01/2015 0001   LDLCALC 151 (H) 01/23/2022 1104   LDLCALC 154 (H) 07/26/2017 1128    History of Present Illness    42 year old female with the above past medical history including palpitations,   moderate mitral valve regurgitation with mitral valve prolapse, hypertension, hyperlipidemia, breast cancer s/p bilateral mastectomy, and Raynaud's phenomenon.   Myoview in 2012 was low risk, no evidence of ischemia.  She has followed with Dr. Ellyn Hack primarily for ongoing monitoring of mitral valve regurgitation with mitral valve prolapse.  Cardiac monitor in the setting of palpitations in 03/2019 showed sinus rhythm with rare PACs and PVCs.  She has been stable on metoprolol.  Most recent echocardiogram in 10/2021 showed EF 60 to 65%, normal LV function, RV systolic function, myxomatous mitral valve with anterior leaflet prolapse, moderate mitral valve regurgitation, posteriorly directed eccentric MR jet, suggesting some improvement in mitral valve regurgitation. Repeat echocardiogram was recommended in 2 years.  She was last seen in the office on 01/23/2022 and was stable from a cardiac standpoint.  Her BP was elevated.  She noted she was no longer taking her valsartan.  Valsartan was restarted.  He contacted our office  on 08/11/2022 with concerns for dizziness, headaches, hypotension.  She reported exercising twice a day for 40 minutes at a time, drinking a gallon of water daily, and following a restricted calorie diet.  She was advised to liberalize salt intake, metoprolol and valsartan were discontinued.  She presents today for follow-up.  Since her last visit  Been stable overall from a cardiac standpoint.  She got a new blood pressure cuff her old blood pressure cuff was too large and since then she has noted more normal blood pressure readings.  She has noted some dizziness at the end of her morning walk when she is coming back.  She denies any palpitations, dyspnea, chest discomfort.  She notes that she gets up at 5 AM to exercise and does not eat before.  She has also had several migraines recently.  I advised her to eat a small snack that includes a protein and carbohydrate prior to exercise in the  mornings.  She does not notice dizziness on her afternoon walks.  Continue to monitor BP.  Follow-up in 3 months. Hypertension/hypotension: Palpitations: Mitral valve regurgitation/mitral valve prolapse: Hyperlipidemia:   Disposition:  Home Medications    Current Outpatient Medications  Medication Sig Dispense Refill   sertraline (ZOLOFT) 100 MG tablet Take 1 tablet (100 mg total) by mouth daily. 90 tablet 3   SYNTHROID 88 MCG tablet Take 1 tablet (88 mcg total) by mouth daily. 90 tablet 3   ALPRAZolam (XANAX) 0.25 MG tablet TAKE 1 TABLET BY MOUTH TWICE A DAY AS NEEDED FOR ANXIETY (Patient not taking: Reported on 09/05/2022) 20 tablet 1   benzonatate (TESSALON) 100 MG capsule Take 1 capsule (100 mg total) by mouth 3 (three) times daily as needed. (Patient not taking: Reported on 09/05/2022) 30 capsule 0   cyanocobalamin (,VITAMIN B-12,) 1000 MCG/ML injection Inject 1,000 mcg into the muscle every 30 (thirty) days. (Patient not taking: Reported on 09/05/2022)     ibuprofen (ADVIL) 800 MG tablet Take 800 mg by mouth 3 (three) times daily as needed (menstrual pain.). (Patient not taking: Reported on 05/24/2022)     metoprolol succinate (TOPROL-XL) 50 MG 24 hr tablet Take 1 tablet (50 mg total) by mouth daily. TAKE WITH OR IMMEDIATELY FOLLOWING A MEAL. (Patient not taking: Reported on 09/05/2022) 90 tablet 3   oxyCODONE-acetaminophen (PERCOCET) 5-325 MG tablet Take 1-2 tablets by mouth every 6 (six) hours as needed for severe pain. (Patient not taking: Reported on 05/24/2022) 40 tablet 0   predniSONE (STERAPRED UNI-PAK 21 TAB) 10 MG (21) TBPK tablet 6 day taper; take as directed on package instructions (Patient not taking: Reported on 08/23/2022) 21 tablet 0   rosuvastatin (CRESTOR) 20 MG tablet Take 1 tablet (20 mg total) by mouth daily. (Patient not taking: Reported on 09/05/2022) 90 tablet 3   SYRINGE-NEEDLE, DISP, 3 ML (SAFESNAP SYRINGE) 25G X 1" 3 ML MISC Use for Vitamin B12 injections per instructions  (Patient not taking: Reported on 09/05/2022)     valsartan (DIOVAN) 160 MG tablet Take 1 tablet (160 mg total) by mouth daily. (Patient not taking: Reported on 09/05/2022) 90 tablet 3   Vitamin D, Ergocalciferol, (DRISDOL) 1.25 MG (50000 UNIT) CAPS capsule TAKE 1 CAPSULE BY MOUTH ONE TIME PER WEEK (Patient not taking: Reported on 09/05/2022) 12 capsule 2   No current facility-administered medications for this visit.     Review of Systems    ***.  All other systems reviewed and are otherwise negative except as noted above.  Physical Exam    VS:  BP 136/72 (BP Location: Left Arm, Patient Position: Sitting, Cuff Size: Normal)   Pulse 93   Ht 5' 7"$  (1.702 m)   Wt 171 lb 6.4 oz (77.7 kg)   SpO2 98%   BMI 26.85 kg/m  GEN: Well nourished, well developed, in no acute distress. HEENT: normal. Neck: Supple, no JVD, carotid bruits, or masses. Cardiac: RRR, no murmurs, rubs, or gallops. No clubbing, cyanosis, edema.  Radials/DP/PT 2+ and equal bilaterally.  Respiratory:  Respirations regular and unlabored, clear to auscultation bilaterally. GI: Soft, nontender, nondistended, BS + x 4. MS: no deformity or atrophy. Skin: warm and dry, no rash. Neuro:  Strength and sensation are intact. Psych: Normal affect.  Accessory Clinical Findings    ECG personally reviewed by me today -NSR, 80 bpm- no acute changes.   Lab Results  Component Value Date   WBC 4.9 09/06/2021   HGB 12.1 09/06/2021   HCT 37.3 09/06/2021   MCV 92.1 09/06/2021   PLT 317 09/06/2021   Lab Results  Component Value Date   CREATININE 0.73 01/23/2022   BUN 9 01/23/2022   NA 137 01/23/2022   K 4.3 01/23/2022   CL 98 01/23/2022   CO2 23 01/23/2022   Lab Results  Component Value Date   ALT 79 (H) 01/23/2022   AST 116 (H) 01/23/2022   ALKPHOS 76 01/23/2022   BILITOT 0.5 01/23/2022   Lab Results  Component Value Date   CHOL 282 (H) 01/23/2022   HDL 117 01/23/2022   LDLCALC 151 (H) 01/23/2022   TRIG 86 01/23/2022    CHOLHDL 2.4 01/23/2022    No results found for: "HGBA1C"  Assessment & Plan    1.  ***      Lenna Sciara, NP 09/05/2022, 3:07 PM

## 2022-09-06 ENCOUNTER — Encounter: Payer: Self-pay | Admitting: Nurse Practitioner

## 2022-10-14 ENCOUNTER — Emergency Department (HOSPITAL_COMMUNITY): Payer: BC Managed Care – PPO

## 2022-10-14 ENCOUNTER — Emergency Department (HOSPITAL_COMMUNITY)
Admission: EM | Admit: 2022-10-14 | Discharge: 2022-10-15 | Discharge status: Against Medical Advice | Payer: BC Managed Care – PPO | Attending: Emergency Medicine | Admitting: Emergency Medicine

## 2022-10-14 ENCOUNTER — Encounter (HOSPITAL_COMMUNITY): Payer: Self-pay | Admitting: Emergency Medicine

## 2022-10-14 DIAGNOSIS — M7989 Other specified soft tissue disorders: Secondary | ICD-10-CM | POA: Diagnosis not present

## 2022-10-14 DIAGNOSIS — S50812A Abrasion of left forearm, initial encounter: Secondary | ICD-10-CM | POA: Insufficient documentation

## 2022-10-14 DIAGNOSIS — S59912A Unspecified injury of left forearm, initial encounter: Secondary | ICD-10-CM | POA: Diagnosis not present

## 2022-10-14 DIAGNOSIS — Z5321 Procedure and treatment not carried out due to patient leaving prior to being seen by health care provider: Secondary | ICD-10-CM | POA: Diagnosis not present

## 2022-10-14 DIAGNOSIS — S4992XA Unspecified injury of left shoulder and upper arm, initial encounter: Secondary | ICD-10-CM | POA: Diagnosis not present

## 2022-10-14 LAB — PREGNANCY, URINE: Preg Test, Ur: NEGATIVE

## 2022-10-14 NOTE — ED Notes (Signed)
Pt called for room, no response. 
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
## 2022-10-22 ENCOUNTER — Other Ambulatory Visit: Payer: Self-pay | Admitting: Family Medicine

## 2022-10-22 DIAGNOSIS — F411 Generalized anxiety disorder: Secondary | ICD-10-CM

## 2022-10-23 NOTE — Telephone Encounter (Signed)
Refill request last apt 11/01/21 next apt 11/06/22.

## 2022-11-06 ENCOUNTER — Encounter: Payer: Self-pay | Admitting: Family Medicine

## 2022-11-06 ENCOUNTER — Ambulatory Visit: Payer: BC Managed Care – PPO | Admitting: Family Medicine

## 2022-11-06 VITALS — BP 130/92 | HR 94 | Temp 97.9°F | Resp 16 | Ht 66.5 in | Wt 155.0 lb

## 2022-11-06 DIAGNOSIS — M4802 Spinal stenosis, cervical region: Secondary | ICD-10-CM

## 2022-11-06 DIAGNOSIS — F411 Generalized anxiety disorder: Secondary | ICD-10-CM

## 2022-11-06 DIAGNOSIS — E038 Other specified hypothyroidism: Secondary | ICD-10-CM

## 2022-11-06 DIAGNOSIS — E559 Vitamin D deficiency, unspecified: Secondary | ICD-10-CM | POA: Diagnosis not present

## 2022-11-06 DIAGNOSIS — Z9889 Other specified postprocedural states: Secondary | ICD-10-CM

## 2022-11-06 DIAGNOSIS — I10 Essential (primary) hypertension: Secondary | ICD-10-CM

## 2022-11-06 DIAGNOSIS — E785 Hyperlipidemia, unspecified: Secondary | ICD-10-CM | POA: Diagnosis not present

## 2022-11-06 DIAGNOSIS — F3341 Major depressive disorder, recurrent, in partial remission: Secondary | ICD-10-CM

## 2022-11-06 DIAGNOSIS — I341 Nonrheumatic mitral (valve) prolapse: Secondary | ICD-10-CM | POA: Diagnosis not present

## 2022-11-06 DIAGNOSIS — Z0000 Encounter for general adult medical examination without abnormal findings: Secondary | ICD-10-CM | POA: Diagnosis not present

## 2022-11-06 DIAGNOSIS — C8190 Hodgkin lymphoma, unspecified, unspecified site: Secondary | ICD-10-CM

## 2022-11-06 DIAGNOSIS — J309 Allergic rhinitis, unspecified: Secondary | ICD-10-CM | POA: Diagnosis not present

## 2022-11-06 DIAGNOSIS — Z981 Arthrodesis status: Secondary | ICD-10-CM

## 2022-11-06 DIAGNOSIS — Z9013 Acquired absence of bilateral breasts and nipples: Secondary | ICD-10-CM

## 2022-11-06 DIAGNOSIS — I7300 Raynaud's syndrome without gangrene: Secondary | ICD-10-CM

## 2022-11-06 DIAGNOSIS — C819A Hodgkin lymphoma, unspecified, in remission: Secondary | ICD-10-CM

## 2022-11-06 LAB — POCT URINALYSIS DIP (PROADVANTAGE DEVICE)
Bilirubin, UA: NEGATIVE
Blood, UA: NEGATIVE
Glucose, UA: NEGATIVE mg/dL
Ketones, POC UA: NEGATIVE mg/dL
Leukocytes, UA: NEGATIVE
Nitrite, UA: NEGATIVE
Protein Ur, POC: NEGATIVE mg/dL
Specific Gravity, Urine: 1.005
Urobilinogen, Ur: 0.2
pH, UA: 6 (ref 5.0–8.0)

## 2022-11-06 MED ORDER — VALSARTAN 160 MG PO TABS: 160.0000 mg | ORAL_TABLET | Freq: Every day | ORAL | 3 refills | Status: DC

## 2022-11-06 MED ORDER — SYNTHROID 88 MCG PO TABS: 88.0000 ug | ORAL_TABLET | Freq: Every day | ORAL | 3 refills | Status: DC

## 2022-11-06 MED ORDER — SERTRALINE HCL 100 MG PO TABS: ORAL_TABLET | ORAL | 1 refills | Status: DC

## 2022-11-06 NOTE — Progress Notes (Signed)
Complete physical exam  Patient: Lisa Waters   DOB: 10-03-1980   42 y.o. Female  MRN: [id number]  Subjective:    Chief Complaint  Patient presents with   Annual Exam    Lisa Waters is a 42 y.o. female who presents today for a complete physical exam. She reports consuming a general diet. Home exercise routine includes 45 minutes workouts daiy. She generally feels fairly well. She reports sleeping fairly well. She complains of a 24-month history of difficulty with increased irritability and anxiety.  She does not note any precipitating causes.  She and her husband are in the process of building a house but this started in October.  She is concerned that possibly the Zoloft is not working as well.  She does have a previous history of Hodgkin's disease and has had bilateral mastectomies apparently because of this.  She also is on thyroid medication.  She does have a history of mitral valve prolapse and has been followed by cardiology.  Apparently she was told she could stop taking her Crestor although she does have evidence of aortic atherosclerosis.  Her allergies are under good control.  She does have a previous history of vitamin D deficiency.  She has had previous difficulty with her neck and has had cervical fusion and discectomy.  Otherwise her family and social history as well as health maintenance and immunizations was reviewed.   Most recent fall risk assessment:    07/26/2017   10:47 AM  Fall Risk   Falls in the past year? No     Most recent depression screenings:    11/06/2022    1:46 PM 11/15/2021    3:40 PM  PHQ 2/9 Scores  PHQ - 2 Score 0 0  PHQ- 9 Score 1 0    Vision:Within last year and Dental: Receives regular dental care    Patient Care Team: Ronnald Nian, MD as PCP - General (Family Medicine) Marykay Lex, MD as PCP - Cardiology (Cardiology)   Outpatient Medications Prior to Visit  Medication Sig   ALPRAZolam (XANAX) 0.25 MG tablet TAKE 1 TABLET BY MOUTH  TWICE A DAY AS NEEDED FOR ANXIETY (Patient not taking: Reported on 09/05/2022)   ibuprofen (ADVIL) 800 MG tablet Take 800 mg by mouth 3 (three) times daily as needed (menstrual pain.). (Patient not taking: Reported on 05/24/2022)   metoprolol succinate (TOPROL-XL) 50 MG 24 hr tablet Take 1 tablet (50 mg total) by mouth daily. TAKE WITH OR IMMEDIATELY FOLLOWING A MEAL. (Patient not taking: Reported on 09/05/2022)   rosuvastatin (CRESTOR) 20 MG tablet Take 1 tablet (20 mg total) by mouth daily. (Patient not taking: Reported on 09/05/2022)   Vitamin D, Ergocalciferol, (DRISDOL) 1.25 MG (50000 UNIT) CAPS capsule TAKE 1 CAPSULE BY MOUTH ONE TIME PER WEEK (Patient not taking: Reported on 09/05/2022)   [DISCONTINUED] benzonatate (TESSALON) 100 MG capsule Take 1 capsule (100 mg total) by mouth 3 (three) times daily as needed. (Patient not taking: Reported on 09/05/2022)   [DISCONTINUED] cyanocobalamin (,VITAMIN B-12,) 1000 MCG/ML injection Inject 1,000 mcg into the muscle every 30 (thirty) days. (Patient not taking: Reported on 09/05/2022)   [DISCONTINUED] predniSONE (STERAPRED UNI-PAK 21 TAB) 10 MG (21) TBPK tablet 6 day taper; take as directed on package instructions (Patient not taking: Reported on 08/23/2022)   [DISCONTINUED] sertraline (ZOLOFT) 100 MG tablet TAKE 1 TABLET BY MOUTH EVERY DAY   [DISCONTINUED] SYNTHROID 88 MCG tablet Take 1 tablet (88 mcg total) by mouth daily.   [  DISCONTINUED] SYRINGE-NEEDLE, DISP, 3 ML (SAFESNAP SYRINGE) 25G X 1" 3 ML MISC Use for Vitamin B12 injections per instructions (Patient not taking: Reported on 09/05/2022)   [DISCONTINUED] valsartan (DIOVAN) 160 MG tablet Take 1 tablet (160 mg total) by mouth daily. (Patient not taking: Reported on 09/05/2022)   No facility-administered medications prior to visit.    Review of Systems  All other systems reviewed and are negative.         Objective:     BP (!) 130/92 (BP Location: Right Arm, Cuff Size: Normal)   Pulse 94   Temp  97.9 F (36.6 C) (Oral)   Resp 16   Ht 5' 6.5" (1.689 m)   Wt 155 lb (70.3 kg)   LMP 10/24/2022   SpO2 97% Comment: room air  BMI 24.64 kg/m    Physical Exam   Alert and in no distress. Tympanic membranes and canals are normal. Pharyngeal area is normal. Neck is supple without adenopathy or thyromegaly. Cardiac exam shows a regular sinus rhythm without murmurs or gallops. Lungs are clear to auscultation.     Assessment & Plan:    Routine general medical examination at a health care facility - Plan: CBC with Differential/Platelet, Comprehensive metabolic panel, Lipid panel, POCT Urinalysis DIP (Proadvantage Device)  Raynaud's disease without gangrene  MVP (mitral valve prolapse)  Essential hypertension - Plan: CBC with Differential/Platelet, Comprehensive metabolic panel, valsartan (DIOVAN) 160 MG tablet  Allergic rhinitis, unspecified seasonality, unspecified trigger  Other specified hypothyroidism - Plan: TSH, SYNTHROID 88 MCG tablet  Vitamin D deficiency - Plan: VITAMIN D 25 Hydroxy (Vit-D Deficiency, Fractures)  Hodgkin's disease in remission (HCC)  Cervical spinal stenosis  Hyperlipidemia LDL goal <100 - Plan: Lipid panel  Generalized anxiety disorder - Plan: sertraline (ZOLOFT) 100 MG tablet  History of mastectomy, bilateral  History of fusion of cervical spine  History of cervical discectomy  Immunization History  Administered Date(s) Administered   Influenza Split 05/20/2012   Influenza,inj,Quad PF,6+ Mos 04/07/2013   PPD Test 03/04/2013   Tdap 04/01/2015    Health Maintenance  Topic Date Due   COVID-19 Vaccine (1) Never done   INFLUENZA VACCINE  02/15/2023   PAP SMEAR-Modifier  05/11/2024   DTaP/Tdap/Td (2 - Td or Tdap) 03/31/2025   Hepatitis C Screening  Completed   HIV Screening  Completed   HPV VACCINES  Aged Out    Discussed her symptoms with her and I will increase her Zoloft to see if that would help.  We discussed possibly switching her  to a different medicine but the tapering off of this and onto something else could take some time.  We can rediscuss this in 1 month when she calls me back.  Discussed checking her blood pressure and pulse less often since she is really not having any MVP related symptoms.  Continue to treat her allergies as needed.  Continue her blood pressure medications as well as thyroid. Problem List Items Addressed This Visit     Essential hypertension (Chronic)   Relevant Medications   valsartan (DIOVAN) 160 MG tablet   Other Relevant Orders   CBC with Differential/Platelet   Comprehensive metabolic panel   Hodgkin's disease in remission (HCC) (Chronic)   Hyperlipidemia LDL goal <100 (Chronic)   Relevant Medications   valsartan (DIOVAN) 160 MG tablet   Other Relevant Orders   Lipid panel   MVP (mitral valve prolapse) (Chronic)   Relevant Medications   valsartan (DIOVAN) 160 MG tablet   Raynauds disease (Chronic)  Relevant Medications   valsartan (DIOVAN) 160 MG tablet   Allergic rhinitis   Cervical spinal stenosis   History of cervical discectomy   History of fusion of cervical spine   History of mastectomy, bilateral   Other specified hypothyroidism   Relevant Medications   SYNTHROID 88 MCG tablet   Other Relevant Orders   TSH   Routine general medical examination at a health care facility - Primary   Relevant Orders   CBC with Differential/Platelet   Comprehensive metabolic panel   Lipid panel   POCT Urinalysis DIP (Proadvantage Device)   Vitamin D deficiency   Relevant Orders   VITAMIN D 25 Hydroxy (Vit-D Deficiency, Fractures)   Other Visit Diagnoses     Generalized anxiety disorder       Relevant Medications   sertraline (ZOLOFT) 100 MG tablet     No therapy needed for the Raynaud's disease. She is to call me in 1 month and let me know how she is doing on the higher dose of the Zoloft.  Also discussed stress and anxiety with her and gave some pointers on not allowing  anything or anyone to have that much control over her stress and anxiety.    Sharlot Gowda, MD

## 2022-11-07 LAB — CBC WITH DIFFERENTIAL/PLATELET
Basophils Absolute: 0.1 10*3/uL (ref 0.0–0.2)
Basos: 1 %
EOS (ABSOLUTE): 0.3 10*3/uL (ref 0.0–0.4)
Eos: 6 %
Hematocrit: 33.6 % — ABNORMAL LOW (ref 34.0–46.6)
Hemoglobin: 10.9 g/dL — ABNORMAL LOW (ref 11.1–15.9)
Immature Grans (Abs): 0 10*3/uL (ref 0.0–0.1)
Immature Granulocytes: 0 %
Lymphocytes Absolute: 1.7 10*3/uL (ref 0.7–3.1)
Lymphs: 33 %
MCH: 27.7 pg (ref 26.6–33.0)
MCHC: 32.4 g/dL (ref 31.5–35.7)
MCV: 85 fL (ref 79–97)
Monocytes Absolute: 0.5 10*3/uL (ref 0.1–0.9)
Monocytes: 10 %
Neutrophils Absolute: 2.7 10*3/uL (ref 1.4–7.0)
Neutrophils: 50 %
Platelets: 299 10*3/uL (ref 150–450)
RBC: 3.94 x10E6/uL (ref 3.77–5.28)
RDW: 14.1 % (ref 11.7–15.4)
WBC: 5.3 10*3/uL (ref 3.4–10.8)

## 2022-11-07 LAB — COMPREHENSIVE METABOLIC PANEL
ALT: 25 IU/L (ref 0–32)
AST: 42 IU/L — ABNORMAL HIGH (ref 0–40)
Albumin/Globulin Ratio: 1.9 (ref 1.2–2.2)
Albumin: 4.6 g/dL (ref 3.9–4.9)
Alkaline Phosphatase: 66 IU/L (ref 44–121)
BUN/Creatinine Ratio: 13 (ref 9–23)
BUN: 9 mg/dL (ref 6–24)
Bilirubin Total: 0.2 mg/dL (ref 0.0–1.2)
CO2: 21 mmol/L (ref 20–29)
Calcium: 9.7 mg/dL (ref 8.7–10.2)
Chloride: 101 mmol/L (ref 96–106)
Creatinine, Ser: 0.68 mg/dL (ref 0.57–1.00)
Globulin, Total: 2.4 g/dL (ref 1.5–4.5)
Glucose: 77 mg/dL (ref 70–99)
Potassium: 4.1 mmol/L (ref 3.5–5.2)
Sodium: 140 mmol/L (ref 134–144)
Total Protein: 7 g/dL (ref 6.0–8.5)
eGFR: 111 mL/min/{1.73_m2} (ref >59)

## 2022-11-07 LAB — LIPID PANEL
Chol/HDL Ratio: 2.2 ratio (ref 0.0–4.4)
Cholesterol, Total: 280 mg/dL — ABNORMAL HIGH (ref 100–199)
HDL: 125 mg/dL (ref >39)
LDL Chol Calc (NIH): 144 mg/dL — ABNORMAL HIGH (ref 0–99)
Triglycerides: 72 mg/dL (ref 0–149)
VLDL Cholesterol Cal: 11 mg/dL (ref 5–40)

## 2022-11-07 LAB — TSH: TSH: 2.94 u[IU]/mL (ref 0.450–4.500)

## 2022-11-07 LAB — VITAMIN D 25 HYDROXY (VIT D DEFICIENCY, FRACTURES): Vit D, 25-Hydroxy: 59.7 ng/mL (ref 30.0–100.0)

## 2022-11-08 ENCOUNTER — Other Ambulatory Visit: Payer: Self-pay

## 2022-11-08 DIAGNOSIS — D649 Anemia, unspecified: Secondary | ICD-10-CM

## 2022-11-15 ENCOUNTER — Telehealth: Payer: BC Managed Care – PPO | Admitting: Family Medicine

## 2022-11-15 DIAGNOSIS — L237 Allergic contact dermatitis due to plants, except food: Secondary | ICD-10-CM | POA: Diagnosis not present

## 2022-11-15 MED ORDER — PREDNISONE 10 MG (21) PO TBPK: ORAL_TABLET | ORAL | 0 refills | Status: DC

## 2022-11-15 NOTE — Progress Notes (Signed)
E-Visit for [employer]  We are sorry that you are not feeing well.  Here is how we plan to help!  Based on what you have shared with me it looks like you have had an allergic reaction to the oily resin from a group of plants.  This resin is very sticky, so it easily attaches to your skin, clothing, tools equipment, and pet's fur.    This blistering rash is often called poison ivy rash although it can come from contact with the leaves, stems and roots of poison ivy, poison oak and poison sumac.  The oily resin contains urushiol (u-ROO-she-ol) that produces a skin rash on exposed skin.  The severity of the rash depends on the amount of urushiol that gets on your skin.  A section of skin with more urushiol on it may develop a rash sooner.  The rash usually develops 12-48 hours after exposure and can last two to three weeks.  Your skin must come in direct contact with the plant's oil to be affected.  Blister fluid doesn't spread the rash.  However, if you come into contact with a piece of clothing or pet fur that has urushiol on it, the rash may spread out.  You can also transfer the oil to other parts of your body with your fingers.  Often the rash looks like a straight line because of the way the plant brushes against your skin.  Most poison ivy treatments are usually limited to self-care methods. Small areas of the rash will typically go away on its own in two to three weeks.  Since your rash is limited, I am recommending that you follow these recommendations:  Make sure that the clothes you were wearing and any towels or sheets that may have come in contact with the oil (urushiol) are washed in detergent and hot water.  Apply Benadryl or Caladryl lotion to the rash.  You may apply these as often as needed to control the itching.  Cool baths also often help with itching.  You may also apply an over-the-counter corticosteroid cream for the first few days.  Take oral antihistamines, such as diphenhydramine  (Benadryl, others), which may also help you sleep better.  Soak in a cool-water bath containing an oatmeal-based bath product (Aveeno).  Place Cool, wet compresses on the affected area for 15 to 30 minutes several times a day.  Avoid a hot shower or bath as this may increase your itching.     I have developed the following plan to treat your condition  I am prescribing a 6 day prednisone taper.  Day 1: Take 2 tablets with breakfast, 1 tablet with lunch, 1 tablet with supper, and 2 tablets at bedtime Day 2: Take 2 tablets with breakfast, 1 tablet with lunch, 1 tablet with supper, and 1 tablet at bedtime Day 3: Take 1 tablet at breakfast, 1 tablet with lunch, 1 tablet with supper, and 1 tablet at bedtime Day 4: Take 1 tablet with breakfast, 1 tablet with supper and 1 tablet at bedtime Day 5: Take 1 tablet with breakfast and 1 tablet with supper or bedtime Day 6: Take 1 tablet with breakfast     What can you do to prevent this rash?  Avoid the plants.  Learn how to identify poison ivy, poison oak and poison sumac in all seasons.  When hiking or engaging in other activities that might expose you to these plants, try to stay on cleared pathways.  If camping, make sure  you pitch your tent in an area free of these plants.  Keep pets from running through wooded areas so that urushiol doesn't accidentally stick to their fur, which you may touch.  Remove or kill the plants.  In your yard, you can get rid of poison ivy by applying an herbicide or pulling it out of the ground, including the roots, while wearing heavy gloves.  Afterward remove the gloves and thoroughly wash them and your hands.  Don't burn poison ivy or related plants because the urushiol can be carried by smoke.  Wear protective clothing.  If needed, protect your skin by wearing socks, boots, pants, long sleeves and vinyl gloves.  Wash your skin right away.  Washing off the oil with soap and water within 30 minutes of exposure may reduce  your chances of getting a poison ivy rash.  Even washing after an hour or so can help reduce the severity of the rash.  If you walk through some poison ivy and then later touch your shoes, you may get some urushiol on your hands, which may then transfer to your face or body by touching or rubbing.  If the contaminated object isn't cleaned, the urushiol on it can still cause a skin reaction years later.    Be careful not to reuse towels after you have washed your skin.  Also carefully wash clothing in detergent and hot water to remove all traces of the oil.  Handle contaminated clothing carefully so you don't transfer the urushiol to yourself, furniture, rugs or appliances.  Remember that pets can carry the oil on their fur and paws.  If you think your pet may be contaminated with urushiol, put on some long rubber gloves and give your pet a bath.  Finally, be careful not to burn these plants as the smoke can contain traces of the oil.  Inhaling the smoke may result in difficulty breathing. If that occurred you should see a physician as soon as possible.  See your doctor right away if:  The reaction is severe or widespread You inhaled the smoke from burning poison ivy and are having difficulty breathing Your skin continues to swell The rash affects your eyes, mouth or genitals Blisters are oozing pus You develop a fever greater than 100 F (37.8 C) The rash doesn't get better within a few weeks.  If you scratch the poison ivy rash, bacteria under your fingernails may cause the skin to become infected.  See your doctor if pus starts oozing from the blisters.  Treatment generally includes antibiotics.  Poison ivy treatments are usually limited to self-care methods.  And the rash typically goes away on its own in two to three weeks.     If the rash is widespread or results in a large number of blisters, your doctor may prescribe an oral corticosteroid, such as prednisone.  If a bacterial  infection has developed at the rash site, your doctor may give you a prescription for an oral antibiotic.  MAKE SURE YOU  Understand these instructions. Will watch your condition. Will get help right away if you are not doing well or get worse.   Thank you for choosing an e-visit.  Your e-visit answers were reviewed by a board certified advanced clinical practitioner to complete your personal care plan. Depending upon the condition, your plan could have included both over the counter or prescription medications.  Please review your pharmacy choice. Make sure the pharmacy is open so you can pick up prescription  now. If there is a problem, you may contact your provider through [employer] and have the prescription routed to another pharmacy.  Your safety is important to [place]. If you have drug allergies check your prescription carefully.   For the next 24 hours you can use MyChart to ask questions about today's visit, request a non-urgent call back, or ask for a work or school excuse. You will get an email in the next two days asking about your experience. I hope that your e-visit has been valuable and will speed your recovery.   I provided 5 minutes of non face-to-face time during this encounter for chart review, medication and order placement, as well as and documentation.

## 2022-11-29 ENCOUNTER — Encounter: Payer: Self-pay | Admitting: Family Medicine

## 2022-12-08 ENCOUNTER — Other Ambulatory Visit: Payer: Self-pay

## 2022-12-08 ENCOUNTER — Ambulatory Visit: Payer: BC Managed Care – PPO | Attending: Nurse Practitioner

## 2022-12-08 ENCOUNTER — Encounter: Payer: Self-pay | Admitting: Nurse Practitioner

## 2022-12-08 ENCOUNTER — Other Ambulatory Visit: Payer: BC Managed Care – PPO

## 2022-12-08 ENCOUNTER — Ambulatory Visit: Payer: BC Managed Care – PPO | Attending: Nurse Practitioner | Admitting: Nurse Practitioner

## 2022-12-08 VITALS — BP 142/106 | HR 92 | Ht 66.5 in | Wt 153.0 lb

## 2022-12-08 DIAGNOSIS — R002 Palpitations: Secondary | ICD-10-CM

## 2022-12-08 DIAGNOSIS — E785 Hyperlipidemia, unspecified: Secondary | ICD-10-CM

## 2022-12-08 DIAGNOSIS — I341 Nonrheumatic mitral (valve) prolapse: Secondary | ICD-10-CM

## 2022-12-08 DIAGNOSIS — I340 Nonrheumatic mitral (valve) insufficiency: Secondary | ICD-10-CM | POA: Diagnosis not present

## 2022-12-08 DIAGNOSIS — R42 Dizziness and giddiness: Secondary | ICD-10-CM

## 2022-12-08 DIAGNOSIS — D649 Anemia, unspecified: Secondary | ICD-10-CM | POA: Diagnosis not present

## 2022-12-08 DIAGNOSIS — I10 Essential (primary) hypertension: Secondary | ICD-10-CM

## 2022-12-08 LAB — CBC WITH DIFFERENTIAL/PLATELET
Basophils Absolute: 0.1 10*3/uL (ref 0.0–0.2)
Basos: 2 %
EOS (ABSOLUTE): 0.2 10*3/uL (ref 0.0–0.4)
Eos: 5 %
Hematocrit: 40.5 % (ref 34.0–46.6)
Hemoglobin: 12.7 g/dL (ref 11.1–15.9)
Immature Grans (Abs): 0 10*3/uL (ref 0.0–0.1)
Immature Granulocytes: 0 %
Lymphocytes Absolute: 0.9 10*3/uL (ref 0.7–3.1)
Lymphs: 25 %
MCH: 28.2 pg (ref 26.6–33.0)
MCHC: 31.4 g/dL — ABNORMAL LOW (ref 31.5–35.7)
MCV: 90 fL (ref 79–97)
Monocytes Absolute: 0.4 10*3/uL (ref 0.1–0.9)
Monocytes: 13 %
Neutrophils Absolute: 1.8 10*3/uL (ref 1.4–7.0)
Neutrophils: 55 %
Platelets: 252 10*3/uL (ref 150–450)
RBC: 4.5 x10E6/uL (ref 3.77–5.28)
RDW: 16.6 % — ABNORMAL HIGH (ref 11.7–15.4)
WBC: 3.4 10*3/uL (ref 3.4–10.8)

## 2022-12-08 MED ORDER — OLMESARTAN MEDOXOMIL 20 MG PO TABS: 20.0000 mg | ORAL_TABLET | Freq: Every day | ORAL | 3 refills | Status: AC

## 2022-12-08 MED ORDER — METOPROLOL SUCCINATE ER 25 MG PO TB24: 25.0000 mg | ORAL_TABLET | Freq: Every day | ORAL | 3 refills | Status: DC

## 2022-12-08 NOTE — Progress Notes (Unsigned)
Enrolled patient for a 14 day Zio XT monitor to be mailed to patients home  Dr Harding to read 

## 2022-12-08 NOTE — Patient Instructions (Addendum)
Medication Instructions:  Start Metoprolol 25 mg daily Start Olmesartan 20 mg daily Stop Valsartan as directed *If you need a refill on your cardiac medications before your next appointment, please call your pharmacy*   Lab Work: BMET in 2 weeks.   Testing/Procedures: ZIO AT Long term monitor-Live Telemetry  Your physician has requested you wear a ZIO patch monitor for 14 days.  This is a single patch monitor. [employer] supplies one patch monitor per enrollment. Additional  stickers are not available.  Please do not apply patch if you will be having a Nuclear Stress Test, Echocardiogram, Cardiac CT, MRI,  or Chest Xray during the period you would be wearing the monitor. The patch cannot be worn during  these tests. You cannot remove and re-apply the ZIO AT patch monitor.  Your ZIO patch monitor will be mailed 3 day USPS to your address on file. It may take 3-5 days to  receive your monitor after you have been enrolled.  Once you have received your monitor, please review the enclosed instructions. Your monitor has  already been registered assigning a specific monitor serial # to you.   Billing and Patient Assistance Program information  Meredeth Ide has been supplied with any insurance information on record for billing. [employer] offers a sliding scale Patient Assistance Program for patients without insurance, or whose  insurance does not completely cover the cost of the ZIO patch monitor. You must apply for the  Patient Assistance Program to qualify for the discounted rate. To apply, call [employer] at [phone],  select option 4, select option 2 , ask to apply for the Patient Assistance Program, (you can request an  interpreter if needed). [employer] will ask your household income and how many people are in your  household. [employer] will quote your out-of-pocket cost based on this information. They will also be able  to set up a 12 month interest free payment plan if needed.  Applying the  monitor   Shave hair from upper left chest.  Hold the abrader disc by orange tab. Rub the abrader in 40 strokes over left upper chest as indicated in  your monitor instructions.  Clean area with 4 enclosed alcohol pads. Use all pads to ensure the area is cleaned thoroughly. Let  dry.  Apply patch as indicated in monitor instructions. Patch will be placed under collarbone on left side of  chest with arrow pointing upward.  Rub patch adhesive wings for 2 minutes. Remove the white label marked "1". Remove the white label  marked "2". Rub patch adhesive wings for 2 additional minutes.  While looking in a mirror, press and release button in center of patch. A small green light will flash 3-4  times. This will be your only indicator that the monitor has been turned on.  Do not shower for the first 24 hours. You may shower after the first 24 hours.  Press the button if you feel a symptom. You will hear a small click. Record Date, Time and Symptom in  the Patient Log.   Starting the Gateway  In your kit there is a Audiological scientist box the size of a cellphone. This is [employer], retail. It transmits all your  recorded data to [facility]. This box must always stay within 10 feet of you. Open the box and push the *  button. There will be a light that blinks orange and then green a few times. When the light stops  blinking, the Gateway is connected to the ZIO patch.  Call [employer] at [phone] to confirm your monitor is transmitting.  Returning your monitor  Remove your patch and place it inside the Gateway. In the lower half of the Gateway there is a white  bag with prepaid postage on it. Place Gateway in bag and seal. Mail package back to [place] as soon as  possible. Your physician should have your final report approximately 7 days after you have mailed back  your monitor. Call [facility] Customer Care at [phone] if you have questions regarding your ZIO AT  patch monitor. Call them  immediately if you see an orange light blinking on your monitor.  If your monitor falls off in less than 4 days, contact our Monitor department at [phone]. If your  monitor becomes loose or falls off after 4 days call [employer] at [phone] for suggestions on  securing your monitor    Follow-Up: At [facility], you and your health needs are our priority.  As part of our continuing mission to provide you with exceptional heart care, we have created designated Provider Care Teams.  These Care Teams include your primary Cardiologist (physician) and Advanced Practice Providers (APPs -  Physician Assistants and Nurse Practitioners) who all work together to provide you with the care you need, when you need it.  We recommend signing up for the patient portal called "MyChart".  Sign up information is provided on this After Visit Summary.  MyChart is used to connect with patients for Virtual Visits (Telemedicine).  Patients are able to view lab/test results, encounter notes, upcoming appointments, etc.  Non-urgent messages can be sent to your provider as well.   To learn more about what you can do with MyChart, go to [email or web address].    Your next appointment:   6-8 week(s)  Provider:   Bernadene Person, NP        Other Instructions

## 2022-12-08 NOTE — Progress Notes (Signed)
Office Visit    Patient Name: Lisa Waters Date of Encounter: 12/08/2022  Primary Care Provider:  Ronnald Nian, MD Primary Cardiologist:  Bryan Lemma, MD  Chief Complaint    42 year old female with a history of palpitations, moderate mitral valve regurgitation with mitral valve prolapse, hypertension, hyperlipidemia, breast cancer s/p bilateral mastectomy, and Raynaud's phenomenon who presents for follow-up related to hypertension and palpitations.   Past Medical History    Past Medical History:  Diagnosis Date   Anxiety    Asthma    mild intermittent as of 07/2017 - just when patient gets sick   Depression    Female infertility    GERD (gastroesophageal reflux disease)    Hx - no current problems, no meds   H/O amaurosis fugax Fall 2012   Resolved, no current problems, Hx-MRI of brain/brain stem: no acute abnormality, small area of encephalomalacia left superior cerebellum that could be prior trauma, infection, or lacunar infarct   History of Hodgkin's lymphoma 2007   in remission, sees [facility], prior with Dr. Francee Gentile; prior chemotherapy and radiation therapy; sees yearly in December, no current problems   Hyperlipidemia 07/2017   diet control, no meds   Hypothyroidism    Moderate mitral regurgitation by prior echocardiogram 09/2010   10/2015: EF 55-60%. Normal WM. Normal D Fxn. Bilateral MVP w/ Mod MR (very eccentric). Normal LA, RV & RA. Normal PAP.   MVP (mitral valve prolapse) 09/2010   Dr. Herbie Baltimore, [facility]; a) Echo 3/'12: Mod MR; EF 60-65%; b) TEE 5/'12: Nl LV Fxn, EF 60-65%, mild, holosystolic prolapse of the medial anterior leaflet. Mod MR . c) Echo 3/'15: Moderate, holosystolic prolapse of  medial. Mod MR d) TM Stress Echo (@ [facility]) Nl Lv Fxn, Mild-Mod MR @ Res0 --> Mod MR at HR 181 bpm (7:22 min, 10.10 METS) w/p WMA   Oral ulcer    Palpitations 10/2012   holter monitor, Dr. Herbie Baltimore; no arrhythmia, no current problems   PONV (postoperative nausea and vomiting)     Past Surgical History:  Procedure Laterality Date   ANTERIOR CERVICAL DECOMP/DISCECTOMY FUSION N/A 09/09/2021   Procedure: C6-7 ANTERIOR CERVICAL DISCECTOMY FUSION, ALLOGRAFT, PLATE;  Surgeon: Eldred Manges, MD;  Location: [facility] OR;  Service: Orthopedics;  Laterality: N/A;   APPENDECTOMY  1993   BREAST ENHANCEMENT SURGERY  2002   COLONOSCOPY  07/2014   Dr. Loreta Ave - normal   DILATATION & CURETTAGE/HYSTEROSCOPY WITH MYOSURE N/A 01/29/2018   Procedure: DILATATION & CURETTAGE/HYSTEROSCOPY WITH MYOSURE POLYPECTOMY;  Surgeon: Vick Frees, MD;  Location: [facility] ORS;  Service: Gynecology;  Laterality: N/A;   ESOPHAGOGASTRODUODENOSCOPY  04/11/2013   [facility] Dr. Loreta Ave   FOOT SURGERY Bilateral    on toes   INSERTION CENTRAL VENOUS ACCESS DEVICE W/ SUBCUTANEOUS PORT  2006   LYMPH NODE BIOPSY  2006   under right arm   MASTECTOMY Bilateral 02/2020   double mastectomy. Has had 9 surgical procedures on breast since then.   NM MYOCAR PERF WALL MOTION  12/2010   persantine myoview - moderate breast attenuation (fixed mid-distal anterior defect), EF 68%, low risk scan   RHINOPLASTY  2007   deviated septum   TEE WITHOUT CARDIOVERSION  11/2010   Nl LV Fxn, EF 60-65%, mild, holosystolic prolapse of the medial anterior leaflet. Mod MR .    TRANSTHORACIC ECHOCARDIOGRAM  3/'14; 3/'15   LVEF 60-65%, wall motion normal, LV function normal, moderate holosystolic MVP (medial the middle scallop of the posterior leaflet), moderate  regurgitation (directed posteriorly), no shunt -- stable over 2 years   TRANSTHORACIC ECHOCARDIOGRAM  11/03/2021   EF 60 to 65%.  Normal LV size and function.  Normal diastolic parameters.  Normal RV size and function.  Normal aortic valve.  Normal RAP.  Myxomatous mitral valve with anterior leaflet prolapse, moderate MR-posteriorly directed eccentric MR jet:   TRANSTHORACIC ECHOCARDIOGRAM  10/22/2019   EF 55 to 60%.  Mild LVH.  GRII DD.  Elevated LAP.  Mild LA dilation.   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  Normal aortic valve.  (Stable   WISDOM TOOTH EXTRACTION      Allergies  Allergies  Allergen Reactions   Clindamycin Nausea And Vomiting    Pt states she took this medication at home and started having heartburn followed by N/V.    Codeine Itching   Fish Allergy Itching    Mahi Mahi   Iohexol Hives   Pindolol     Avoid non selective beta blockers due to dyspnea   Iodinated Contrast Media Rash    Has received since rash developed without pre-meds without development of rash.     Labs/Other Studies Reviewed    The following studies were reviewed today:  Cardiac Studies & Procedures     STRESS TESTS  NM MYOCAR MULTI W/SPECT W 11/21/2013   ECHOCARDIOGRAM  ECHOCARDIOGRAM COMPLETE 10/28/2021  Narrative ECHOCARDIOGRAM REPORT    Patient Name:   Lisa Waters  Date of Exam: 10/28/2021 Medical Rec #:  [id number]     Height:       67.0 in Accession #:    [id number]    Weight:       161.0 lb Date of Birth:  1981/07/11     BSA:          1.844 m Patient Age:    40 years      BP:           126/83 mmHg Patient Gender: F             HR:           99 bpm. Exam Location:  [address]  Procedure: 2D Echo, 3D Echo, Cardiac Doppler and Color Doppler  Indications:    Mitral Valve Prolapse I34.1  History:        Patient has prior history of Echocardiogram examinations, most recent 10/22/2019. Mitral Valve Disease; Risk Factors:Dyslipidemia.  Sonographer:    Thurman Coyer RDCS Referring Phys: [id number] [facility]   Sonographer Comments: Image acquisition challenging due to breast implants. IMPRESSIONS   1. Left ventricular ejection fraction, by estimation, is 60 to 65%. The left ventricle has normal function. The left ventricle has no regional wall motion abnormalities. Left ventricular diastolic parameters were normal. 2. Right ventricular systolic function is normal. The right ventricular size is normal. 3. Eccentric posteriorly directed jet,  myxomatous valve with prolapse of the anterior leaflet, EROA 12 mm2. May be undestimated. MR mild to moderate. 4. The aortic valve is grossly normal. Aortic valve regurgitation is not visualized. 5. The inferior vena cava is normal in size with greater than 50% respiratory variability, suggesting right atrial pressure of 3 mmHg.  Conclusion(s)/Recommendation(s): Compared to prior study 10/22/2019, MR appears improved.  FINDINGS Left Ventricle: Left ventricular ejection fraction, by estimation, is 60 to 65%. The left ventricle has normal function. The left ventricle has no regional wall motion abnormalities. The left ventricular internal cavity size was normal in size. There is no left ventricular hypertrophy. Left ventricular diastolic parameters  were normal.  Right Ventricle: The right ventricular size is normal. No increase in right ventricular wall thickness. Right ventricular systolic function is normal.  Left Atrium: Left atrial size was normal in size.  Right Atrium: Right atrial size was normal in size.  Pericardium: There is no evidence of pericardial effusion.  Mitral Valve: Eccentric posteriorly directed jet, myxomatous valve with prolapse of the anterior leaflet, EROA 12 mm2. May be undestimated. MR mild to moderate.  Tricuspid Valve: The tricuspid valve is grossly normal. Tricuspid valve regurgitation is not demonstrated.  Aortic Valve: The aortic valve is grossly normal. Aortic valve regurgitation is not visualized.  Pulmonic Valve: Pulmonic valve regurgitation is not visualized.  Aorta: The aortic root and ascending aorta are structurally normal, with no evidence of dilitation.  Venous: The inferior vena cava is normal in size with greater than 50% respiratory variability, suggesting right atrial pressure of 3 mmHg.  IAS/Shunts: No atrial level shunt detected by color flow Doppler.   LEFT VENTRICLE PLAX 2D LVIDd:         4.60 cm   Diastology LVIDs:         3.40 cm   LV  e' medial:    8.81 cm/s LV PW:         0.80 cm   LV E/e' medial:  12.1 LV IVS:        0.90 cm   LV e' lateral:   11.50 cm/s LVOT diam:     2.10 cm   LV E/e' lateral: 9.3 LV SV:         50 LV SV Index:   27 LVOT Area:     3.46 cm  3D Volume EF: 3D EF:        60 % LV EDV:       148 ml LV ESV:       60 ml LV SV:        89 ml  RIGHT VENTRICLE RV Basal diam:  2.40 cm RV S prime:     12.80 cm/s TAPSE (M-mode): 2.2 cm  LEFT ATRIUM             Index        RIGHT ATRIUM           Index LA diam:        4.00 cm 2.17 cm/m   RA Area:     10.40 cm LA Vol (A2C):   61.6 ml 33.40 ml/m  RA Volume:   21.10 ml  11.44 ml/m LA Vol (A4C):   36.3 ml 19.68 ml/m LA Biplane Vol: 48.3 ml 26.19 ml/m AORTIC VALVE LVOT Vmax:   86.20 cm/s LVOT Vmean:  56.400 cm/s LVOT VTI:    0.143 m  AORTA Ao Root diam: 2.80 cm Ao Asc diam:  2.80 cm  MITRAL VALVE MV Area (PHT): 4.80 cm       SHUNTS MV Decel Time: 158 msec       Systemic VTI:  0.14 m MR Peak grad:    133.6 mmHg   Systemic Diam: 2.10 cm MR Mean grad:    81.0 mmHg MR Vmax:         578.00 cm/s MR Vmean:        425.0 cm/s MR PISA:         1.90 cm MR PISA Eff ROA: 10 mm MR PISA Radius:  0.55 cm MV E velocity: 107.00 cm/s MV A velocity: 85.60 cm/s MV E/A ratio:  1.25  [employer]  Electronically signed by Carolan Clines Signature Date/Time: 10/28/2021/11:23:13 AM    Final    MONITORS  LONG TERM MONITOR (3-14 DAYS) 04/21/2019  Narrative  14-day ZIO patch monitor  Sinus rhythm with very rare PACs and PVCs.  Minimum heart rate 70 bpm. Maximum heart rate 171 bpm with average heart rate 96 bpm.  No tachycardia or bradycardia arrhythmias noted.  Pretty normal monitor.  Symptoms noted with heart rates greater than 90 bpm.  With these findings, we can consider increasing the diltiazem dose to lower resting heart rate.  Bryan Lemma, MD          Recent Labs: 11/06/2022: ALT 25; BUN 9; Creatinine, Ser 0.68; Hemoglobin 10.9; Platelets  299; Potassium 4.1; Sodium 140; TSH 2.940  Recent Lipid Panel    Component Value Date/Time   CHOL 280 (H) 11/06/2022 1440   TRIG 72 11/06/2022 1440   HDL 125 11/06/2022 1440   CHOLHDL 2.2 11/06/2022 1440   CHOLHDL 2.5 07/26/2017 1128   VLDL 11 04/01/2015 0001   LDLCALC 144 (H) 11/06/2022 1440   LDLCALC 154 (H) 07/26/2017 1128    History of Present Illness    42 year old female with the above past medical history including palpitations,  moderate mitral valve regurgitation with mitral valve prolapse, hypertension, hyperlipidemia, breast cancer s/p bilateral mastectomy, and Raynaud's phenomenon.    Myoview in 2012 was low risk, no evidence of ischemia.  She has followed with Dr. Herbie Baltimore primarily for ongoing monitoring of mitral valve regurgitation with mitral valve prolapse.  Cardiac monitor in the setting of palpitations in 03/2019 showed sinus rhythm with rare PACs and PVCs.  She has been stable on metoprolol.  Most recent echocardiogram in 10/2021 showed EF 60 to 65%, normal LV function, RV systolic function, myxomatous mitral valve with anterior leaflet prolapse, moderate mitral valve regurgitation, posteriorly directed eccentric MR jet, suggesting some improvement in mitral valve regurgitation. Repeat echocardiogram was recommended in 2 years.  She contacted our office on 08/11/2022 with concerns for dizziness, headaches, hypotension.  She reported exercising twice a day for 40 minutes at a time, drinking a gallon of water daily, and following a restricted calorie diet.  She was advised to liberalize salt intake, metoprolol and valsartan were discontinued.  She was last seen in the office on 09/05/2022 and was stable from a cardiac standpoint.  Her blood pressure had normalized.  She did note some dizziness at the end of her morning walks.  It was noted that he was exercising on an empty stomach.  She was advised to eat a small snack prior to exercise.   She presents today for follow-up.  Since  her last visit she has been stable overall from a cardiac standpoint.  She has noted an elevated resting heart rate.  She was recently diagnosed with iron deficiency anemia, this is being managed per her PCP.  BP has also been elevated.  She notes generalized fatigue, occasional lightheadedness, and palpitations with associated chest comfort.  She had an episode 2 weeks ago that lasted approximately 2 hours during which her heart rate was elevated and she had a sharp pain in her chest.  She denies any exertional symptoms concerning for angina, denies presyncope, syncope.   Home Medications    Current Outpatient Medications  Medication Sig Dispense Refill   ALPRAZolam (XANAX) 0.25 MG tablet TAKE 1 TABLET BY MOUTH TWICE A DAY AS NEEDED FOR ANXIETY 20 tablet 1   ibuprofen (ADVIL) 800 MG tablet Take 800 mg by mouth 3 (  three) times daily as needed (menstrual pain.).     metoprolol succinate (TOPROL XL) 25 MG 24 hr tablet Take 1 tablet (25 mg total) by mouth daily. 90 tablet 3   Multiple Vitamin (MULTIVITAMIN ADULT PO) Take 1 Capful by mouth daily.     olmesartan (BENICAR) 20 MG tablet Take 1 tablet (20 mg total) by mouth daily. 90 tablet 3   predniSONE (STERAPRED UNI-PAK 21 TAB) 10 MG (21) TBPK tablet Take as directed 21 tablet 0   rosuvastatin (CRESTOR) 20 MG tablet Take 1 tablet (20 mg total) by mouth daily. 90 tablet 3   sertraline (ZOLOFT) 100 MG tablet Take 1-1/2 pills daily (Patient taking differently: Take 150 mg by mouth daily.) 45 tablet 1   SYNTHROID 88 MCG tablet Take 1 tablet (88 mcg total) by mouth daily. 90 tablet 3   Vitamin D, Ergocalciferol, (DRISDOL) 1.25 MG (50000 UNIT) CAPS capsule TAKE 1 CAPSULE BY MOUTH ONE TIME PER WEEK 12 capsule 2   No current facility-administered medications for this visit.     Review of Systems    She denies dyspnea, pnd, orthopnea, n, v, syncope, edema, weight gain, or early satiety. All other systems reviewed and are otherwise negative except as noted  above.   Physical Exam    VS:  BP (!) 142/106   Pulse 92   Ht 5' 6.5" (1.689 m)   Wt 153 lb (69.4 kg)   BMI 24.32 kg/m  . GEN: Well nourished, well developed, in no acute distress. HEENT: normal. Neck: Supple, no JVD, carotid bruits, or masses. Cardiac: RRR, no murmurs, rubs, or gallops. No clubbing, cyanosis, edema.  Radials/DP/PT 2+ and equal bilaterally.  Respiratory:  Respirations regular and unlabored, clear to auscultation bilaterally. GI: Soft, nontender, nondistended, BS + x 4. MS: no deformity or atrophy. Skin: warm and dry, no rash. Neuro:  Strength and sensation are intact. Psych: Normal affect.  Accessory Clinical Findings    ECG personally reviewed by me today -NSR, 92 bpm- no acute changes.   Lab Results  Component Value Date   WBC 5.3 11/06/2022   HGB 10.9 (L) 11/06/2022   HCT 33.6 (L) 11/06/2022   MCV 85 11/06/2022   PLT 299 11/06/2022   Lab Results  Component Value Date   CREATININE 0.68 11/06/2022   BUN 9 11/06/2022   NA 140 11/06/2022   K 4.1 11/06/2022   CL 101 11/06/2022   CO2 21 11/06/2022   Lab Results  Component Value Date   ALT 25 11/06/2022   AST 42 (H) 11/06/2022   ALKPHOS 66 11/06/2022   BILITOT 0.2 11/06/2022   Lab Results  Component Value Date   CHOL 280 (H) 11/06/2022   HDL 125 11/06/2022   LDLCALC 144 (H) 11/06/2022   TRIG 72 11/06/2022   CHOLHDL 2.2 11/06/2022    No results found for: "HGBA1C"  Assessment & Plan    1. Hypertension: BP elevated above goal in office today.  Previously on valsartan, though she has not been taking any medications for blood pressure the past several months.  Will start olmesartan 20 mg daily. BMET in 2 weeks. Continue to monitor BP and report BP consistently greater than 130/80.    2. Palpitations/dizziness/chest discomfort/fatigue: Prior monitor in 2020 showed rare PVCs and PACs.  Previously on metoprolol.  She has recently noted elevated resting heart rate, generalized fatigue, occasional  lightheadedness, and frequent palpitations with associated chest discomfort.  Denies exertional symptoms concerning for angina.  Will check 14-day ZIO.  Will resume metoprolol succinate 25 mg daily.  Discussed ED precautions.    3. Mitral valve regurgitation/mitral valve prolapse: Most recent echo in 10/2021 showed EF 60 to 65%, normal LV function, RV systolic function, myxomatous mitral valve with anterior leaflet prolapse, moderate mitral valve regurgitation, posteriorly directed eccentric MR jet, suggesting some improvement in mitral valve regurgitation. Repeat echocardiogram was recommended in 2 years (10/2023).    4. Hyperlipidemia: LDL was 144 in 10/2022, above goal.  She is no longer taking Crestor.  Encouraged ongoing lifestyle modifications with diet and exercise.  She is fairly active and follows a healthy diet.  She would likely benefit from resumption of statin therapy.  Monitored and managed per PCP.  5. Anemia: Recently diagnosed.  She has been taking iron supplements.  Repeat CBC pending today.  This could be contributing to her above symptoms (see #2).  Managed per PCP.   6. Disposition: Follow-up in 6 to 8 weeks.  HYPERTENSION CONTROL Vitals:   12/08/22 0912 12/08/22 0947  BP: (!) 138/106 (!) 142/106    The patient's blood pressure is elevated above target today.  In order to address the patient's elevated BP: Blood pressure will be monitored at home to determine if medication changes need to be made.; A new medication was prescribed today.; Follow up with general cardiology has been recommended.     Joylene Grapes, NP 12/08/2022, 9:51 AM

## 2022-12-10 ENCOUNTER — Other Ambulatory Visit: Payer: Self-pay | Admitting: Family Medicine

## 2022-12-10 DIAGNOSIS — E038 Other specified hypothyroidism: Secondary | ICD-10-CM

## 2022-12-12 ENCOUNTER — Other Ambulatory Visit: Payer: BC Managed Care – PPO

## 2022-12-12 DIAGNOSIS — R002 Palpitations: Secondary | ICD-10-CM | POA: Diagnosis not present

## 2022-12-12 DIAGNOSIS — R42 Dizziness and giddiness: Secondary | ICD-10-CM

## 2022-12-13 ENCOUNTER — Telehealth: Payer: BC Managed Care – PPO | Admitting: Nurse Practitioner

## 2022-12-13 ENCOUNTER — Encounter: Payer: Self-pay | Admitting: Family Medicine

## 2022-12-13 DIAGNOSIS — K121 Other forms of stomatitis: Secondary | ICD-10-CM

## 2022-12-13 MED ORDER — CHLORHEXIDINE GLUCONATE 0.12 % MT SOLN: 15.0000 mL | Freq: Two times a day (BID) | OROMUCOSAL | 0 refills | Status: AC

## 2022-12-13 NOTE — Progress Notes (Signed)
E-Visit for Mouth Ulcers  We are sorry that you are not feeling well.  Here is how we plan to help!  Based on what you have shared with me, it appears that you do have mouth ulcer(s).     The following medications should decrease the discomfort and help with healing. Orajel (Available over the counter) and Chlorhexidine mouthwash daily for 3 days - this will be called into your pharmacy   Mouth ulcers are painful areas in the mouth and gums. These are also known as "canker sores".  They can occur anywhere inside the mouth. While mostly harmless, mouth ulcers can be extremely uncomfortable and may make it difficult to eat, drink, and brush your teeth.  You may have more than 1 ulcer and they can vary and change in size. Mouth ulcers are not contagious and should not be confused with cold sores.  Cold sores appear on the lip or around the outside of the mouth and often begin with a tingling, burning or itching sensation.   While the exact causes are unknown, some common causes and factors that may aggravate mouth ulcers include: Genetics - Sometimes mouth ulcers run in families High alcohol intake Acidic foods such as citrus fruits like pineapple, grapefruit, orange fruits/juices, may aggravate mouth ulcers Other foods high in acidity or spice such as coffee, chocolate, chips, pretzels, eggs, nuts, cheese Quitting smoking Injury caused by biting the tongue or inside of the cheek Diet lacking in B-12, zinc, folic acid or iron Female hormone shifts with menstruation Excessive fatigue, emotional stress or anxiety Prevention: Talk to your doctor if you are taking meds that are known to cause mouth ulcers such as:   Anti-inflammatory drugs (for example Ibuprofen, Naproxen sodium), pain killers, Beta blockers, Oral nicotine replacement drugs, Some street drugs (heroin).   Avoid allowing any tablets to dissolve in your mouth that are meant to swallowed whole Avoid foods/drinks that trigger or worsen  symptoms Keep your mouth clean with daily brushing and flossing  Home Care: The goal with treatment is to ease the pain where ulcers occur and help them heal as quickly as possible.  There is no medical treatment to prevent mouth ulcers from coming back or recurring.  Avoid spicy and acidic foods Eat soft foods and avoid rough, crunchy foods Avoid chewing gum Do not use toothpaste that contains sodium lauryl sulphite Use a straw to drink which helps avoid liquids toughing the ulcers near the front of your mouth Use a very soft toothbrush If you have dentures or dental hardware that you feel is not fitting well or contributing to his, please see your dentist. Use saltwater mouthwash which helps healing. Dissolve a  teaspoon of salt in a glass of warm water. Swish around your mouth and spit it out. This can be used as needed if it is soothing.   GET HELP RIGHT AWAY IF: Persistent ulcers require checking IN PERSON (face to face). Any mouth lesion lasting longer than a month should be seen by your DENTIST as soon as possible for evaluation for possible oral cancer. If you have a non-painful ulcer in 1 or more areas of your mouth Ulcers that are spreading, are very large or particularly painful Ulcers last longer than one week without improving on treatment If you develop a fever, swollen glands and begin to feel unwell Ulcers that developed after starting a new medication MAKE SURE YOU: Understand these instructions. Will watch your condition. Will get help right away if you are  not doing well or get worse.  Thank you for choosing an e-visit.  Your e-visit answers were reviewed by a board certified advanced clinical practitioner to complete your personal care plan. Depending upon the condition, your plan could have included both over the counter or prescription medications.  Please review your pharmacy choice. Make sure the pharmacy is open so you can pick up prescription now. If there is  a problem, you may contact your provider through [employer] and have the prescription routed to another pharmacy.  Your safety is important to [place]. If you have drug allergies check your prescription carefully.   For the next 24 hours you can use MyChart to ask questions about today's visit, request a non-urgent call back, or ask for a work or school excuse. You will get an email in the next two days asking about your experience. I hope that your e-visit has been valuable and will speed your recovery.   Meds ordered this encounter  Medications   chlorhexidine (PERIDEX) 0.12 % solution    Sig: Use as directed 15 mLs in the mouth or throat 2 (two) times daily. Swish and spit    Dispense:  120 mL    Refill:  0     I spent approximately 5 minutes reviewing the patient's history, current symptoms and coordinating their care today.

## 2022-12-19 ENCOUNTER — Telehealth: Payer: BC Managed Care – PPO | Admitting: Nurse Practitioner

## 2022-12-19 DIAGNOSIS — L237 Allergic contact dermatitis due to plants, except food: Secondary | ICD-10-CM

## 2022-12-19 MED ORDER — PREDNISONE 10 MG PO TABS: ORAL_TABLET | ORAL | 0 refills | Status: DC

## 2022-12-19 NOTE — Progress Notes (Signed)
E-Visit for [employer]  We are sorry that you are not feeing well.  Here is how we plan to help!  Based on what you have shared with me it looks like you have had an allergic reaction to the oily resin from a group of plants.  This resin is very sticky, so it easily attaches to your skin, clothing, tools equipment, and pet's fur.    This blistering rash is often called poison ivy rash although it can come from contact with the leaves, stems and roots of poison ivy, poison oak and poison sumac.  The oily resin contains urushiol (u-ROO-she-ol) that produces a skin rash on exposed skin.  The severity of the rash depends on the amount of urushiol that gets on your skin.  A section of skin with more urushiol on it may develop a rash sooner.  The rash usually develops 12-48 hours after exposure and can last two to three weeks.  Your skin must come in direct contact with the plant's oil to be affected.  Blister fluid doesn't spread the rash.  However, if you come into contact with a piece of clothing or pet fur that has urushiol on it, the rash may spread out.  You can also transfer the oil to other parts of your body with your fingers.  Often the rash looks like a straight line because of the way the plant brushes against your skin.  Most poison ivy treatments are usually limited to self-care methods. Small areas of the rash will typically go away on its own in two to three weeks.  Since your rash is limited, I am recommending that you follow these recommendations:  Make sure that the clothes you were wearing and any towels or sheets that may have come in contact with the oil (urushiol) are washed in detergent and hot water.  Apply Benadryl or Caladryl lotion to the rash.  You may apply these as often as needed to control the itching.  Cool baths also often help with itching.  You may also apply an over-the-counter corticosteroid cream for the first few days.  Take oral antihistamines, such as diphenhydramine  (Benadryl, others), which may also help you sleep better.  Soak in a cool-water bath containing an oatmeal-based bath product (Aveeno).  Place Cool, wet compresses on the affected area for 15 to 30 minutes several times a day.  Avoid a hot shower or bath as this may increase your itching.     I have developed the following plan to treat your condition I am prescribing a two week course of steroids (37 tablets of 10 mg prednisone).  Days 1-4 take 4 tablets (40 mg) daily  Days 5-8 take 3 tablets (30 mg) daily, Days 9-11 take 2 tablets (20 mg) daily, Days 12-14 take 1 tablet (10 mg) daily.   Please note that repeated use of prednisone is not recommended. If you can continue with over the counter Calamine and topical hydrocortisone with an oral antihistamine like Benadryl for another 24 hours to see if you can control the spread that would be preferred. If this method does not help continue with the plan for prednisone   What can you do to prevent this rash?  Avoid the plants.  Learn how to identify poison ivy, poison oak and poison sumac in all seasons.  When hiking or engaging in other activities that might expose you to these plants, try to stay on cleared pathways.  If camping, make sure you pitch  your tent in an area free of these plants.  Keep pets from running through wooded areas so that urushiol doesn't accidentally stick to their fur, which you may touch.  Remove or kill the plants.  In your yard, you can get rid of poison ivy by applying an herbicide or pulling it out of the ground, including the roots, while wearing heavy gloves.  Afterward remove the gloves and thoroughly wash them and your hands.  Don't burn poison ivy or related plants because the urushiol can be carried by smoke.  Wear protective clothing.  If needed, protect your skin by wearing socks, boots, pants, long sleeves and vinyl gloves.  Wash your skin right away.  Washing off the oil with soap and water within 30 minutes of  exposure may reduce your chances of getting a poison ivy rash.  Even washing after an hour or so can help reduce the severity of the rash.  If you walk through some poison ivy and then later touch your shoes, you may get some urushiol on your hands, which may then transfer to your face or body by touching or rubbing.  If the contaminated object isn't cleaned, the urushiol on it can still cause a skin reaction years later.    Be careful not to reuse towels after you have washed your skin.  Also carefully wash clothing in detergent and hot water to remove all traces of the oil.  Handle contaminated clothing carefully so you don't transfer the urushiol to yourself, furniture, rugs or appliances.  Remember that pets can carry the oil on their fur and paws.  If you think your pet may be contaminated with urushiol, put on some long rubber gloves and give your pet a bath.  Finally, be careful not to burn these plants as the smoke can contain traces of the oil.  Inhaling the smoke may result in difficulty breathing. If that occurred you should see a physician as soon as possible.  See your doctor right away if:  The reaction is severe or widespread You inhaled the smoke from burning poison ivy and are having difficulty breathing Your skin continues to swell The rash affects your eyes, mouth or genitals Blisters are oozing pus You develop a fever greater than 100 F (37.8 C) The rash doesn't get better within a few weeks.  If you scratch the poison ivy rash, bacteria under your fingernails may cause the skin to become infected.  See your doctor if pus starts oozing from the blisters.  Treatment generally includes antibiotics.  Poison ivy treatments are usually limited to self-care methods.  And the rash typically goes away on its own in two to three weeks.     If the rash is widespread or results in a large number of blisters, your doctor may prescribe an oral corticosteroid, such as prednisone.  If  a bacterial infection has developed at the rash site, your doctor may give you a prescription for an oral antibiotic.  MAKE SURE YOU  Understand these instructions. Will watch your condition. Will get help right away if you are not doing well or get worse.   Thank you for choosing an e-visit.  Your e-visit answers were reviewed by a board certified advanced clinical practitioner to complete your personal care plan. Depending upon the condition, your plan could have included both over the counter or prescription medications.  Please review your pharmacy choice. Make sure the pharmacy is open so you can pick up prescription now. If  there is a problem, you may contact your provider through [employer] and have the prescription routed to another pharmacy.  Your safety is important to [place]. If you have drug allergies check your prescription carefully.   For the next 24 hours you can use MyChart to ask questions about today's visit, request a non-urgent call back, or ask for a work or school excuse. You will get an email in the next two days asking about your experience. I hope that your e-visit has been valuable and will speed your recovery.  Meds ordered this encounter  Medications   predniSONE (DELTASONE) 10 MG tablet    Sig: Take 4 tablets (40mg ) on days 1-4, then 3 tablets (30mg ) on days 5-8, then 2 tablets (20mg ) on days 9-11, then 1 tablet daily for days 12-14. Take with food.    Dispense:  37 tablet    Refill:  0    I spent approximately 5 minutes reviewing the patient's history, current symptoms and coordinating their care today.

## 2022-12-26 DIAGNOSIS — K120 Recurrent oral aphthae: Secondary | ICD-10-CM | POA: Diagnosis not present

## 2022-12-26 DIAGNOSIS — C8190 Hodgkin lymphoma, unspecified, unspecified site: Secondary | ICD-10-CM | POA: Diagnosis not present

## 2022-12-26 DIAGNOSIS — D649 Anemia, unspecified: Secondary | ICD-10-CM | POA: Diagnosis not present

## 2023-01-01 DIAGNOSIS — R42 Dizziness and giddiness: Secondary | ICD-10-CM | POA: Diagnosis not present

## 2023-01-01 DIAGNOSIS — R002 Palpitations: Secondary | ICD-10-CM | POA: Diagnosis not present

## 2023-01-09 DIAGNOSIS — K120 Recurrent oral aphthae: Secondary | ICD-10-CM | POA: Diagnosis not present

## 2023-01-11 DIAGNOSIS — N951 Menopausal and female climacteric states: Secondary | ICD-10-CM | POA: Diagnosis not present

## 2023-01-11 DIAGNOSIS — N920 Excessive and frequent menstruation with regular cycle: Secondary | ICD-10-CM | POA: Diagnosis not present

## 2023-01-15 ENCOUNTER — Encounter: Payer: Self-pay | Admitting: Nurse Practitioner

## 2023-01-15 ENCOUNTER — Ambulatory Visit: Payer: BC Managed Care – PPO | Attending: Nurse Practitioner | Admitting: Nurse Practitioner

## 2023-01-15 VITALS — BP 148/98 | HR 95 | Ht 67.0 in | Wt 160.2 lb

## 2023-01-15 DIAGNOSIS — R072 Precordial pain: Secondary | ICD-10-CM

## 2023-01-15 DIAGNOSIS — R42 Dizziness and giddiness: Secondary | ICD-10-CM | POA: Diagnosis not present

## 2023-01-15 DIAGNOSIS — I340 Nonrheumatic mitral (valve) insufficiency: Secondary | ICD-10-CM

## 2023-01-15 DIAGNOSIS — I341 Nonrheumatic mitral (valve) prolapse: Secondary | ICD-10-CM

## 2023-01-15 DIAGNOSIS — D649 Anemia, unspecified: Secondary | ICD-10-CM

## 2023-01-15 DIAGNOSIS — I471 Supraventricular tachycardia, unspecified: Secondary | ICD-10-CM | POA: Diagnosis not present

## 2023-01-15 DIAGNOSIS — I7300 Raynaud's syndrome without gangrene: Secondary | ICD-10-CM

## 2023-01-15 DIAGNOSIS — I10 Essential (primary) hypertension: Secondary | ICD-10-CM | POA: Diagnosis not present

## 2023-01-15 DIAGNOSIS — I2510 Atherosclerotic heart disease of native coronary artery without angina pectoris: Secondary | ICD-10-CM

## 2023-01-15 DIAGNOSIS — R002 Palpitations: Secondary | ICD-10-CM | POA: Diagnosis not present

## 2023-01-15 DIAGNOSIS — E785 Hyperlipidemia, unspecified: Secondary | ICD-10-CM

## 2023-01-15 MED ORDER — AMLODIPINE BESYLATE 2.5 MG PO TABS: 2.5000 mg | ORAL_TABLET | Freq: Every day | ORAL | 3 refills | Status: AC

## 2023-01-15 MED ORDER — METOPROLOL SUCCINATE ER 50 MG PO TB24: 25.0000 mg | ORAL_TABLET | Freq: Every day | ORAL | 3 refills | Status: DC

## 2023-01-15 MED ORDER — METOPROLOL SUCCINATE ER 50 MG PO TB24
50.0000 mg | ORAL_TABLET | Freq: Every day | ORAL | 3 refills | Status: AC
Start: 2023-01-15 — End: ?

## 2023-01-15 MED ORDER — PREDNISONE 10 MG PO TABS: ORAL_TABLET | ORAL | 0 refills | Status: DC

## 2023-01-15 MED ORDER — METOPROLOL TARTRATE 100 MG PO TABS: ORAL_TABLET | ORAL | 0 refills | Status: DC

## 2023-01-15 NOTE — Patient Instructions (Addendum)
Medication Instructions:  Start Amlodipine 2.5 mg daily Increase Metoprolol Succinate 50 mg daily Prednisone 50 mg - take 13 hours prior to test, Take another Prednisone 50 mg 7 hours prior to test, Take another Prednisone 50 mg 1 hour prior to test, Take Benadryl 50 mg 1 hour prior to test Metoprolol Tartrate 100 mg take 1 tablet 2 hours prior to CT procedure.   *If you need a refill on your cardiac medications before your next appointment, please call your pharmacy*   Lab Work: BMET today  Testing/Procedures: Your physician has requested that you have an echocardiogram. Echocardiography is a painless test that uses sound waves to create images of your heart. It provides your doctor with information about the size and shape of your heart and how well your heart's chambers and valves are working. This procedure takes approximately one hour. There are no restrictions for this procedure. Please do NOT wear cologne, perfume, aftershave, or lotions (deodorant is allowed). Please arrive 15 minutes prior to your appointment time.  Cardiac CT Your physician has requested that you have a carotid duplex. This test is an ultrasound of the carotid arteries in your neck. It looks at blood flow through these arteries that supply the brain with blood. Allow one hour for this exam. There are no restrictions or special instructions.     Follow-Up: At [facility], you and your health needs are our priority.  As part of our continuing mission to provide you with exceptional heart care, we have created designated Provider Care Teams.  These Care Teams include your primary Cardiologist (physician) and Advanced Practice Providers (APPs -  Physician Assistants and Nurse Practitioners) who all work together to provide you with the care you need, when you need it.  We recommend signing up for the patient portal called "MyChart".  Sign up information is provided on this After Visit Summary.  MyChart is  used to connect with patients for Virtual Visits (Telemedicine).  Patients are able to view lab/test results, encounter notes, upcoming appointments, etc.  Non-urgent messages can be sent to your provider as well.   To learn more about what you can do with MyChart, go to [email or web address].    Your next appointment:   6-8 week(s)  Provider:   Bernadene Person, NP        Other Instructions   Your cardiac CT will be scheduled at one of the below locations:   [facility] [address] [place], [place] [address] [phone]  OR  [facility] [address] [place], [place] [address] [phone]  OR   [facility] [address] [place], [place] [address] [phone]  If scheduled at [facility], please arrive at the [facility] and Children's Entrance (Entrance C2) of [facility] 30 minutes prior to test start time. You can use the FREE valet parking offered at entrance C (encouraged to control the heart rate for the test)  Proceed to the [facility] Radiology Department (first floor) to check-in and test prep.  All radiology patients and guests should use entrance C2 at [facility], accessed from [facility], even though the hospital's physical address listed is [address].    If scheduled at [facility] or [facility], please arrive 15 mins early for check-in and test prep.   Please follow these instructions carefully (unless otherwise directed):  An IV will be required  for this test and Nitroglycerin will be given.  Hold all erectile dysfunction medications at least 3 days (72 hrs) prior to test. (Ie viagra, cialis, sildenafil, tadalafil, etc)   On the Night Before the Test: Be sure to Drink plenty of water. Do not consume any caffeinated/decaffeinated beverages or chocolate 12 hours prior to  your test. Do not take any antihistamines 12 hours prior to your test. If the patient has contrast allergy: Patient will need a prescription for Prednisone and very clear instructions (as follows): Prednisone 50 mg - take 13 hours prior to test Take another Prednisone 50 mg 7 hours prior to test Take another Prednisone 50 mg 1 hour prior to test Take Benadryl 50 mg 1 hour prior to test Patient must complete all four doses of above prophylactic medications. Patient will need a ride after test due to Benadryl.  On the Day of the Test: Drink plenty of water until 1 hour prior to the test. Do not eat any food 1 hour prior to test. You may take your regular medications prior to the test.  Take metoprolol (Lopressor) two hours prior to test. If you take Furosemide/Hydrochlorothiazide/Spironolactone, please HOLD on the morning of the test. FEMALES- please wear underwire-free bra if available, avoid dresses & tight clothing   *For Clinical Staff only. Please instruct patient the following:* Heart Rate Medication Recommendations for Cardiac CT  Resting HR < 50 bpm  No medication  Resting HR 50-60 bpm and BP >110/50 mmHG   Consider Metoprolol tartrate 25 mg PO 90-120 min prior to scan  Resting HR 60-65 bpm and BP >110/50 mmHG  Metoprolol tartrate 50 mg PO 90-120 minutes prior to scan   Resting HR > 65 bpm and BP >110/50 mmHG  Metoprolol tartrate 100 mg PO 90-120 minutes prior to scan  Consider Ivabradine 10-15 mg PO or a calcium channel blocker for resting HR >60 bpm and contraindication to metoprolol tartrate  Consider Ivabradine 10-15 mg PO in combination with metoprolol tartrate for HR >80 bpm         After the Test: Drink plenty of water. After receiving IV contrast, you may experience a mild flushed feeling. This is normal. On occasion, you may experience a mild rash up to 24 hours after the test. This is not dangerous. If this occurs, you can take Benadryl 25 mg and increase your  fluid intake. If you experience trouble breathing, this can be serious. If it is severe call 911 IMMEDIATELY. If it is mild, please call our office. If you take any of these medications: Glipizide/Metformin, Avandament, Glucavance, please do not take 48 hours after completing test unless otherwise instructed.  We will call to schedule your test 2-4 weeks out understanding that some insurance companies will need an authorization prior to the service being performed.   For more information and frequently asked questions, please visit our website : [email or web address]  For non-scheduling related questions, please contact the cardiac imaging nurse navigator should you have any questions/concerns: Rockwell [place], Cardiac Imaging Nurse Navigator Larey Brick, Cardiac Imaging Nurse Navigator [facility] Direct Office Dial: [phone]   For scheduling needs, including cancellations and rescheduling, please call [place], [phone].

## 2023-01-15 NOTE — Progress Notes (Signed)
Office Visit    Patient Name: Lisa Waters Date of Encounter: 01/15/2023  Primary Care Provider:  Ronnald Nian, MD Primary Cardiologist:  Bryan Lemma, MD  Chief Complaint    42 year old female with a history of palpitations, SVT, moderate mitral valve regurgitation with mitral valve prolapse, hypertension, hyperlipidemia, breast cancer s/p bilateral mastectomy, and Raynaud's phenomenon who presents for follow-up related to hypertension and palpitations.   Past Medical History    Past Medical History:  Diagnosis Date   Anxiety    Asthma    mild intermittent as of 07/2017 - just when patient gets sick   Depression    Female infertility    GERD (gastroesophageal reflux disease)    Hx - no current problems, no meds   H/O amaurosis fugax Fall 2012   Resolved, no current problems, Hx-MRI of brain/brain stem: no acute abnormality, small area of encephalomalacia left superior cerebellum that could be prior trauma, infection, or lacunar infarct   History of Hodgkin's lymphoma 2007   in remission, sees [facility], prior with Dr. Francee Gentile; prior chemotherapy and radiation therapy; sees yearly in December, no current problems   Hyperlipidemia 07/2017   diet control, no meds   Hypothyroidism    Moderate mitral regurgitation by prior echocardiogram 09/2010   10/2015: EF 55-60%. Normal WM. Normal D Fxn. Bilateral MVP w/ Mod MR (very eccentric). Normal LA, RV & RA. Normal PAP.   MVP (mitral valve prolapse) 09/2010   Dr. Herbie Baltimore, [facility]; a) Echo 3/'12: Mod MR; EF 60-65%; b) TEE 5/'12: Nl LV Fxn, EF 60-65%, mild, holosystolic prolapse of the medial anterior leaflet. Mod MR . c) Echo 3/'15: Moderate, holosystolic prolapse of  medial. Mod MR d) TM Stress Echo (@ [facility]) Nl Lv Fxn, Mild-Mod MR @ Res0 --> Mod MR at HR 181 bpm (7:22 min, 10.10 METS) w/p WMA   Oral ulcer    Palpitations 10/2012   holter monitor, Dr. Herbie Baltimore; no arrhythmia, no current problems   PONV (postoperative nausea and vomiting)     Past Surgical History:  Procedure Laterality Date   ANTERIOR CERVICAL DECOMP/DISCECTOMY FUSION N/A 09/09/2021   Procedure: C6-7 ANTERIOR CERVICAL DISCECTOMY FUSION, ALLOGRAFT, PLATE;  Surgeon: Eldred Manges, MD;  Location: [facility] OR;  Service: Orthopedics;  Laterality: N/A;   APPENDECTOMY  1993   BREAST ENHANCEMENT SURGERY  2002   COLONOSCOPY  07/2014   Dr. Loreta Ave - normal   DILATATION & CURETTAGE/HYSTEROSCOPY WITH MYOSURE N/A 01/29/2018   Procedure: DILATATION & CURETTAGE/HYSTEROSCOPY WITH MYOSURE POLYPECTOMY;  Surgeon: Vick Frees, MD;  Location: [facility];  Service: Gynecology;  Laterality: N/A;   ESOPHAGOGASTRODUODENOSCOPY  04/11/2013   [facility] Dr. Loreta Ave   FOOT SURGERY Bilateral    on toes   INSERTION CENTRAL VENOUS ACCESS DEVICE W/ SUBCUTANEOUS PORT  2006   LYMPH NODE BIOPSY  2006   under right arm   MASTECTOMY Bilateral 02/2020   double mastectomy. Has had 9 surgical procedures on breast since then.   NM MYOCAR PERF WALL MOTION  12/2010   persantine myoview - moderate breast attenuation (fixed mid-distal anterior defect), EF 68%, low risk scan   RHINOPLASTY  2007   deviated septum   TEE WITHOUT CARDIOVERSION  11/2010   Nl LV Fxn, EF 60-65%, mild, holosystolic prolapse of the medial anterior leaflet. Mod MR .    TRANSTHORACIC ECHOCARDIOGRAM  3/'14; 3/'15   LVEF 60-65%, wall motion normal, LV function normal, moderate holosystolic MVP (medial the middle scallop of the posterior leaflet),  moderate regurgitation (directed posteriorly), no shunt -- stable over 2 years   TRANSTHORACIC ECHOCARDIOGRAM  11/03/2021   EF 60 to 65%.  Normal LV size and function.  Normal diastolic parameters.  Normal RV size and function.  Normal aortic valve.  Normal RAP.  Myxomatous mitral valve with anterior leaflet prolapse, moderate MR-posteriorly directed eccentric MR jet:   TRANSTHORACIC ECHOCARDIOGRAM  10/22/2019   EF 55 to 60%.  Mild LVH.  GRII DD.  Elevated LAP.  Mild LA dilation.   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  Normal aortic valve.  (Stable   WISDOM TOOTH EXTRACTION      Allergies  Allergies  Allergen Reactions   Clindamycin Nausea And Vomiting    Pt states she took this medication at home and started having heartburn followed by N/V.    Codeine Itching   Fish Allergy Itching    Mahi Mahi   Iohexol Hives   Pindolol     Avoid non selective beta blockers due to dyspnea   Iodinated Contrast Media Rash    Has received since rash developed without pre-meds without development of rash.     Labs/Other Studies Reviewed    The following studies were reviewed today:  Cardiac Studies & Procedures     STRESS TESTS  NM MYOCAR MULTI W/SPECT W 11/21/2013   ECHOCARDIOGRAM  ECHOCARDIOGRAM COMPLETE 10/28/2021  Narrative ECHOCARDIOGRAM REPORT    Patient Name:   Lisa Waters  Date of Exam: 10/28/2021 Medical Rec #:  [id number]     Height:       67.0 in Accession #:    [id number]    Weight:       161.0 lb Date of Birth:  1981-06-27     BSA:          1.844 m Patient Age:    40 years      BP:           126/83 mmHg Patient Gender: F             HR:           99 bpm. Exam Location:  [address]  Procedure: 2D Echo, 3D Echo, Cardiac Doppler and Color Doppler  Indications:    Mitral Valve Prolapse I34.1  History:        Patient has prior history of Echocardiogram examinations, most recent 10/22/2019. Mitral Valve Disease; Risk Factors:Dyslipidemia.  Sonographer:    Thurman Coyer RDCS Referring Phys: [id number] [address] [facility]   Sonographer Comments: Image acquisition challenging due to breast implants. IMPRESSIONS   1. Left ventricular ejection fraction, by estimation, is 60 to 65%. The left ventricle has normal function. The left ventricle has no regional wall motion abnormalities. Left ventricular diastolic parameters were normal. 2. Right ventricular systolic function is normal. The right ventricular size is normal. 3. Eccentric posteriorly directed jet,  myxomatous valve with prolapse of the anterior leaflet, EROA 12 mm2. May be undestimated. MR mild to moderate. 4. The aortic valve is grossly normal. Aortic valve regurgitation is not visualized. 5. The inferior vena cava is normal in size with greater than 50% respiratory variability, suggesting right atrial pressure of 3 mmHg.  Conclusion(s)/Recommendation(s): Compared to prior study 10/22/2019, MR appears improved.  FINDINGS Left Ventricle: Left ventricular ejection fraction, by estimation, is 60 to 65%. The left ventricle has normal function. The left ventricle has no regional wall motion abnormalities. The left ventricular internal cavity size was normal in size. There is no left ventricular hypertrophy. Left ventricular diastolic  parameters were normal.  Right Ventricle: The right ventricular size is normal. No increase in right ventricular wall thickness. Right ventricular systolic function is normal.  Left Atrium: Left atrial size was normal in size.  Right Atrium: Right atrial size was normal in size.  Pericardium: There is no evidence of pericardial effusion.  Mitral Valve: Eccentric posteriorly directed jet, myxomatous valve with prolapse of the anterior leaflet, EROA 12 mm2. May be undestimated. MR mild to moderate.  Tricuspid Valve: The tricuspid valve is grossly normal. Tricuspid valve regurgitation is not demonstrated.  Aortic Valve: The aortic valve is grossly normal. Aortic valve regurgitation is not visualized.  Pulmonic Valve: Pulmonic valve regurgitation is not visualized.  Aorta: The aortic root and ascending aorta are structurally normal, with no evidence of dilitation.  Venous: The inferior vena cava is normal in size with greater than 50% respiratory variability, suggesting right atrial pressure of 3 mmHg.  IAS/Shunts: No atrial level shunt detected by color flow Doppler.   LEFT VENTRICLE PLAX 2D LVIDd:         4.60 cm   Diastology LVIDs:         3.40 cm   LV  e' medial:    8.81 cm/s LV PW:         0.80 cm   LV E/e' medial:  12.1 LV IVS:        0.90 cm   LV e' lateral:   11.50 cm/s LVOT diam:     2.10 cm   LV E/e' lateral: 9.3 LV SV:         50 LV SV Index:   27 LVOT Area:     3.46 cm  3D Volume EF: 3D EF:        60 % LV EDV:       148 ml LV ESV:       60 ml LV SV:        89 ml  RIGHT VENTRICLE RV Basal diam:  2.40 cm RV S prime:     12.80 cm/s TAPSE (M-mode): 2.2 cm  LEFT ATRIUM             Index        RIGHT ATRIUM           Index LA diam:        4.00 cm 2.17 cm/m   RA Area:     10.40 cm LA Vol (A2C):   61.6 ml 33.40 ml/m  RA Volume:   21.10 ml  11.44 ml/m LA Vol (A4C):   36.3 ml 19.68 ml/m LA Biplane Vol: 48.3 ml 26.19 ml/m AORTIC VALVE LVOT Vmax:   86.20 cm/s LVOT Vmean:  56.400 cm/s LVOT VTI:    0.143 m  AORTA Ao Root diam: 2.80 cm Ao Asc diam:  2.80 cm  MITRAL VALVE MV Area (PHT): 4.80 cm       SHUNTS MV Decel Time: 158 msec       Systemic VTI:  0.14 m MR Peak grad:    133.6 mmHg   Systemic Diam: 2.10 cm MR Mean grad:    81.0 mmHg MR Vmax:         578.00 cm/s MR Vmean:        425.0 cm/s MR PISA:         1.90 cm MR PISA Eff ROA: 10 mm MR PISA Radius:  0.55 cm MV E velocity: 107.00 cm/s MV A velocity: 85.60 cm/s MV E/A ratio:  1.25  Corrie Dandy  Branch Electronically signed by Carolan Clines Signature Date/Time: 10/28/2021/11:23:13 AM    Final    MONITORS  LONG TERM MONITOR (3-14 DAYS) 04/21/2019  Narrative  14-day ZIO patch monitor  Sinus rhythm with very rare PACs and PVCs.  Minimum heart rate 70 bpm. Maximum heart rate 171 bpm with average heart rate 96 bpm.  No tachycardia or bradycardia arrhythmias noted.  Pretty normal monitor.  Symptoms noted with heart rates greater than 90 bpm.  With these findings, we can consider increasing the diltiazem dose to lower resting heart rate.  Bryan Lemma, MD          Recent Labs: 11/06/2022: ALT 25; BUN 9; Creatinine, Ser 0.68; Potassium 4.1; Sodium 140;  TSH 2.940 12/08/2022: Hemoglobin 12.7; Platelets 252  Recent Lipid Panel    Component Value Date/Time   CHOL 280 (H) 11/06/2022 1440   TRIG 72 11/06/2022 1440   HDL 125 11/06/2022 1440   CHOLHDL 2.2 11/06/2022 1440   CHOLHDL 2.5 07/26/2017 1128   VLDL 11 04/01/2015 0001   LDLCALC 144 (H) 11/06/2022 1440   LDLCALC 154 (H) 07/26/2017 1128    History of Present Illness    43 year old female with the above past medical history including palpitations, SVT, moderate mitral valve regurgitation with mitral valve prolapse, hypertension, hyperlipidemia, breast cancer s/p bilateral mastectomy, and Raynaud's phenomenon.    Myoview in 2012 was low risk, no evidence of ischemia.  She has followed with Dr. Herbie Baltimore primarily for ongoing monitoring of mitral valve regurgitation with mitral valve prolapse.  Cardiac monitor in the setting of palpitations in 03/2019 showed sinus rhythm with rare PACs and PVCs.  She has been stable on metoprolol.  Most recent echocardiogram in 10/2021 showed EF 60 to 65%, normal LV function, RV systolic function, myxomatous mitral valve with anterior leaflet prolapse, moderate mitral valve regurgitation, posteriorly directed eccentric MR jet, suggesting some improvement in mitral valve regurgitation. Repeat echocardiogram was recommended in 2 years.  She contacted our office on 08/11/2022 with concerns for dizziness, headaches, hypotension.  She reported exercising twice a day for 40 minutes at a time, drinking a gallon of water daily, and following a restricted calorie diet.  She was advised to liberalize salt intake, metoprolol and valsartan were discontinued.  She was last seen in the office on 12/08/2022 and was stable from a cardiac standpoint.  Her BP was elevated.  She noted generalized fatigue, occasional lightheadedness, palpitations with associated chest discomfort.  She was restarted on olmesartan 20 mg daily.  14-day ZIO preliminary results predominantly sinus rhythm, 6 runs of  SVT, rare PACs and PVCs.  She presents today for follow-up.  Since her last visit she has been stable from a cardiac standpoint though she continues to note intermittent palpitations, dizziness, labile BP, intermittently elevated heart rate and intermittent chest pain both at rest and with exertion.  She also notes generalized fatigue and night sweats.  She was recently treated with prednisone for mouth sores, of which she has a prior history. She notes she is tired of feeling poorly and does not understand why she is still having symptoms. She saw her GYN who recommended she follow-up with cardiology.  Home Medications    Current Outpatient Medications  Medication Sig Dispense Refill   ALPRAZolam (XANAX) 0.25 MG tablet TAKE 1 TABLET BY MOUTH TWICE A DAY AS NEEDED FOR ANXIETY 20 tablet 1   amLODipine (NORVASC) 2.5 MG tablet Take 1 tablet (2.5 mg total) by mouth daily. 90 tablet 3   Iron-Folic  Acid-Vit B12 (IRON FORMULA PO) Take by mouth daily.     metoprolol succinate (TOPROL XL) 50 MG 24 hr tablet Take 1 tablet (50 mg total) by mouth daily. Take with or immediately following a meal. 90 tablet 3   metoprolol tartrate (LOPRESSOR) 100 MG tablet Take 1 tablet 2 hours prior to procedure. 1 tablet 0   naproxen (NAPROSYN) 500 MG tablet Take 500 mg by mouth as needed.     olmesartan (BENICAR) 20 MG tablet Take 1 tablet (20 mg total) by mouth daily. 90 tablet 3   predniSONE (DELTASONE) 10 MG tablet Take 5 tablets 13 hours prior to CT, take 5 tablets 7 hours prior to CT, take 5 tablets 1 hour prior to CT 15 tablet 0   sertraline (ZOLOFT) 100 MG tablet Take 1-1/2 pills daily (Patient taking differently: Take 150 mg by mouth daily.) 45 tablet 1   SYNTHROID 88 MCG tablet Take 1 tablet (88 mcg total) by mouth daily. 90 tablet 3   chlorhexidine (PERIDEX) 0.12 % solution Use as directed 15 mLs in the mouth or throat 2 (two) times daily. Swish and spit (Patient not taking: Reported on 01/15/2023) 120 mL 0    ibuprofen (ADVIL) 800 MG tablet Take 800 mg by mouth 3 (three) times daily as needed (menstrual pain.). (Patient not taking: Reported on 01/15/2023)     Multiple Vitamin (MULTIVITAMIN ADULT PO) Take 1 Capful by mouth daily. (Patient not taking: Reported on 01/15/2023)     rosuvastatin (CRESTOR) 20 MG tablet Take 1 tablet (20 mg total) by mouth daily. (Patient not taking: Reported on 01/15/2023) 90 tablet 3   Vitamin D, Ergocalciferol, (DRISDOL) 1.25 MG (50000 UNIT) CAPS capsule TAKE 1 CAPSULE BY MOUTH ONE TIME PER WEEK (Patient not taking: Reported on 01/15/2023) 12 capsule 2   No current facility-administered medications for this visit.     Review of Systems    She denies chest pain, palpitations, dyspnea, pnd, orthopnea, n, v, dizziness, syncope, edema, weight gain, or early satiety. All other systems reviewed and are otherwise negative except as noted above.   Physical Exam    VS:  BP (!) 148/98   Pulse 95   Ht 5\' 7"  (1.702 m)   Wt 160 lb 3.2 oz (72.7 kg)   SpO2 98%   BMI 25.09 kg/m   GEN: Well nourished, well developed, in no acute distress. HEENT: normal. Neck: Supple, no JVD, carotid bruits, or masses. Cardiac: RRR, no murmurs, rubs, or gallops. No clubbing, cyanosis, edema.  Radials/DP/PT 2+ and equal bilaterally.  Respiratory:  Respirations regular and unlabored, clear to auscultation bilaterally. GI: Soft, nontender, nondistended, BS + x 4. MS: no deformity or atrophy. Skin: warm and dry, no rash. Neuro:  Strength and sensation are intact. Psych: Normal affect.  Accessory Clinical Findings    ECG personally reviewed by me today -   No EKG in office today.    Lab Results  Component Value Date   WBC 3.4 12/08/2022   HGB 12.7 12/08/2022   HCT 40.5 12/08/2022   MCV 90 12/08/2022   PLT 252 12/08/2022   Lab Results  Component Value Date   CREATININE 0.68 11/06/2022   BUN 9 11/06/2022   NA 140 11/06/2022   K 4.1 11/06/2022   CL 101 11/06/2022   CO2 21 11/06/2022   Lab  Results  Component Value Date   ALT 25 11/06/2022   AST 42 (H) 11/06/2022   ALKPHOS 66 11/06/2022   BILITOT 0.2 11/06/2022  Lab Results  Component Value Date   CHOL 280 (H) 11/06/2022   HDL 125 11/06/2022   LDLCALC 144 (H) 11/06/2022   TRIG 72 11/06/2022   CHOLHDL 2.2 11/06/2022    No results found for: "HGBA1C"  Assessment & Plan    1. Hypertension: BP elevated above goal in office today.  Previously on valsartan, recently transition to olmesartan. Will increase metoprolol as below, will start amlodipine as below.  Continue to monitor BP and report BP consistently greater than 130/80.     2. Palpitations/PSVT/dizziness/chest discomfort/fatigue: Prior monitor in 2020 showed rare PVCs and PACs.  Recent cardiac monitor showed predominantly sinus rhythm, runs of SVT.  She notes ongoing elevated resting heart rate, generalized fatigue, occasional lightheadedness, and frequent palpitations with associated chest discomfort.  She has also noted some chest pain with exertion.  She does have evidence of coronary calcium on prior CT of the chest.  Will repeat echo, will check carotid Dopplers given dizziness, will check coronary CTA for further restratification.  Will increase metoprolol to 50 mg daily.  Reviewed ED precautions.  She does have a documented history of contrast allergy.  She has successfully received contrast with premeds.  Therefore, we will premedicate with prednisone and Benadryl per protocol. Will update BMET. Continue metoprolol as above.    3. Mitral valve regurgitation/mitral valve prolapse: Most recent echo in 10/2021 showed EF 60 to 65%, normal LV function, normal RV systolic function, myxomatous mitral valve with anterior leaflet prolapse, moderate mitral valve regurgitation, posteriorly directed eccentric MR jet, suggesting some improvement in mitral valve regurgitation. Repeat echocardiogram was recommended in 2 years (10/2023).  However, given ongoing symptoms as above, will  repeat echo.   4. Hyperlipidemia: LDL was 144 in 10/2022, above goal.  She is no longer taking Crestor.  Encouraged ongoing lifestyle modifications with diet and exercise.  She is fairly active and follows a healthy diet.  She would likely benefit from resumption of statin therapy.  Will revisit pending CT results as above.   5. Anemia: She has been taking iron supplements. Managed per PCP.  6.  Raynaud's phenomenon: She notes intermittent symptoms. Will start amlodipine 2.5 mg daily, and titrate as needed.   7. Disposition: Follow-up in 6 to 8 weeks.  HYPERTENSION CONTROL Vitals:   01/15/23 1459 01/15/23 1520  BP: (!) 142/98 (!) 148/98    The patient's blood pressure is elevated above target today.  In order to address the patient's elevated BP: A current anti-hypertensive medication was adjusted today.; A new medication was prescribed today.; Blood pressure will be monitored at home to determine if medication changes need to be made.; Follow up with general cardiology has been recommended.      Joylene Grapes, NP 01/15/2023, 4:29 PM

## 2023-01-16 ENCOUNTER — Telehealth: Payer: Self-pay

## 2023-01-16 LAB — BASIC METABOLIC PANEL
BUN/Creatinine Ratio: 14 (ref 9–23)
BUN: 8 mg/dL (ref 6–24)
CO2: 25 mmol/L (ref 20–29)
Calcium: 9.5 mg/dL (ref 8.7–10.2)
Chloride: 98 mmol/L (ref 96–106)
Creatinine, Ser: 0.57 mg/dL (ref 0.57–1.00)
Glucose: 75 mg/dL (ref 70–99)
Potassium: 4 mmol/L (ref 3.5–5.2)
Sodium: 141 mmol/L (ref 134–144)
eGFR: 116 mL/min/{1.73_m2} (ref >59)

## 2023-01-16 NOTE — Telephone Encounter (Signed)
Lab results reviewed by pt via mychart. Pt ok to proceed with procedure.

## 2023-01-20 ENCOUNTER — Encounter: Payer: Self-pay | Admitting: Family Medicine

## 2023-01-20 DIAGNOSIS — F411 Generalized anxiety disorder: Secondary | ICD-10-CM

## 2023-01-22 ENCOUNTER — Encounter (HOSPITAL_COMMUNITY): Payer: Self-pay

## 2023-01-22 MED ORDER — SERTRALINE HCL 100 MG PO TABS: ORAL_TABLET | ORAL | 1 refills | Status: DC

## 2023-01-24 ENCOUNTER — Ambulatory Visit (HOSPITAL_COMMUNITY)
Admission: RE | Admit: 2023-01-24 | Discharge: 2023-01-24 | Disposition: A | Discharge status: Home / Self Care | Payer: BC Managed Care – PPO | Source: Ambulatory Visit | Attending: Nurse Practitioner | Admitting: Nurse Practitioner

## 2023-01-24 ENCOUNTER — Other Ambulatory Visit: Payer: Self-pay

## 2023-01-24 ENCOUNTER — Encounter (HOSPITAL_COMMUNITY): Payer: Self-pay

## 2023-01-24 ENCOUNTER — Ambulatory Visit: Payer: BC Managed Care – PPO | Admitting: Nurse Practitioner

## 2023-01-24 DIAGNOSIS — R002 Palpitations: Secondary | ICD-10-CM | POA: Diagnosis not present

## 2023-01-24 DIAGNOSIS — R072 Precordial pain: Secondary | ICD-10-CM | POA: Diagnosis not present

## 2023-01-24 DIAGNOSIS — I2510 Atherosclerotic heart disease of native coronary artery without angina pectoris: Secondary | ICD-10-CM | POA: Insufficient documentation

## 2023-01-24 MED ORDER — DILTIAZEM HCL 25 MG/5ML IV SOLN
10.0000 mg | INTRAVENOUS | Status: DC | PRN
  Administered 2023-01-24 (×2): 10 mg via INTRAVENOUS

## 2023-01-24 MED ORDER — NITROGLYCERIN 0.4 MG SL SUBL
SUBLINGUAL_TABLET | SUBLINGUAL | Status: AC
  Filled 2023-01-24: qty 1

## 2023-01-24 MED ORDER — NITROGLYCERIN 0.4 MG SL SUBL: 0.8000 mg | SUBLINGUAL_TABLET | SUBLINGUAL | Status: DC | PRN

## 2023-01-24 MED ORDER — PREDNISONE 10 MG PO TABS: ORAL_TABLET | ORAL | 0 refills | Status: AC

## 2023-01-24 MED ORDER — METOPROLOL TARTRATE 100 MG PO TABS: ORAL_TABLET | ORAL | 0 refills | Status: AC

## 2023-01-24 MED ORDER — METOPROLOL TARTRATE 5 MG/5ML IV SOLN
INTRAVENOUS | Status: AC
  Filled 2023-01-24: qty 5

## 2023-01-24 MED ORDER — DILTIAZEM HCL 25 MG/5ML IV SOLN
INTRAVENOUS | Status: AC
  Filled 2023-01-24: qty 5

## 2023-01-24 MED ORDER — IVABRADINE HCL 5 MG PO TABS: 15.0000 mg | ORAL_TABLET | Freq: Once | ORAL | 0 refills | Status: AC

## 2023-01-24 NOTE — Progress Notes (Signed)
Unable to decrease heart rate with medications as ordered for CTA scan, reader notified, patient d/c without scan

## 2023-01-25 ENCOUNTER — Telehealth: Payer: Self-pay | Admitting: Cardiology

## 2023-01-25 ENCOUNTER — Other Ambulatory Visit (HOSPITAL_COMMUNITY): Payer: Self-pay

## 2023-01-25 ENCOUNTER — Telehealth (HOSPITAL_COMMUNITY): Payer: Self-pay | Admitting: *Deleted

## 2023-01-25 DIAGNOSIS — I2510 Atherosclerotic heart disease of native coronary artery without angina pectoris: Secondary | ICD-10-CM

## 2023-01-25 MED ORDER — IVABRADINE HCL 5 MG PO TABS
15.0000 mg | ORAL_TABLET | Freq: Once | ORAL | 0 refills | Status: AC
Start: 2023-01-25 — End: 2023-01-26
  Filled 2023-01-25: qty 3, 1d supply, fill #0

## 2023-01-25 NOTE — Telephone Encounter (Signed)
Patient aware Corlanor was sent to [facility]

## 2023-01-25 NOTE — Telephone Encounter (Signed)
Pt c/o medication issue:  1. Name of Medication:  Corlanor  2. How are you currently taking this medication (dosage and times per day)?   3. Are you having a reaction (difficulty breathing--STAT)?   4. What is your medication issue?   Patient states she was advised that Corlanor should have been called in to her pharmacy but they have not received it. Please confirm whether patient needs this medication for her CT. If so, she would like to have it sent to her pharmacy.  CT is scheduled for 7/12 at 11:30 AM.

## 2023-01-25 NOTE — Telephone Encounter (Signed)
Reaching out to patient to offer assistance regarding upcoming cardiac imaging study; pt verbalizes understanding of appt date/time, parking situation and where to check in, pre-test NPO status and medications ordered, and verified current allergies; name and call back number provided for further questions should they arise  Larey Brick RN Navigator Cardiac Imaging [facility] [phone] office [phone] cell  Patient verbalized how to take 13 hour prep in addition to metoprolol and ivabradine for her second attempt.

## 2023-01-26 ENCOUNTER — Ambulatory Visit (HOSPITAL_COMMUNITY)
Admission: RE | Admit: 2023-01-26 | Discharge: 2023-01-26 | Disposition: A | Discharge status: Home / Self Care | Payer: BC Managed Care – PPO | Source: Ambulatory Visit | Attending: Nurse Practitioner | Admitting: Nurse Practitioner

## 2023-01-26 ENCOUNTER — Encounter (HOSPITAL_COMMUNITY): Payer: Self-pay

## 2023-01-26 DIAGNOSIS — I2510 Atherosclerotic heart disease of native coronary artery without angina pectoris: Secondary | ICD-10-CM | POA: Insufficient documentation

## 2023-01-26 DIAGNOSIS — R072 Precordial pain: Secondary | ICD-10-CM | POA: Diagnosis not present

## 2023-01-26 DIAGNOSIS — R002 Palpitations: Secondary | ICD-10-CM | POA: Insufficient documentation

## 2023-01-26 MED ORDER — LORAZEPAM 2 MG/ML IJ SOLN
1.0000 mg | INTRAMUSCULAR | Status: AC
  Administered 2023-01-26: 1 mg via INTRAVENOUS

## 2023-01-26 MED ORDER — NITROGLYCERIN 0.4 MG SL SUBL
SUBLINGUAL_TABLET | SUBLINGUAL | Status: AC
  Filled 2023-01-26: qty 2

## 2023-01-26 MED ORDER — LORAZEPAM 1 MG PO TABS: 1.0000 mg | ORAL_TABLET | ORAL | Status: DC

## 2023-01-26 MED ORDER — NITROGLYCERIN 0.4 MG SL SUBL: 0.8000 mg | SUBLINGUAL_TABLET | SUBLINGUAL | Status: DC | PRN

## 2023-01-26 MED ORDER — DILTIAZEM HCL 25 MG/5ML IV SOLN
INTRAVENOUS | Status: AC
  Filled 2023-01-26: qty 5

## 2023-01-26 MED ORDER — METOPROLOL TARTRATE 5 MG/5ML IV SOLN
INTRAVENOUS | Status: AC
  Filled 2023-01-26: qty 10

## 2023-01-26 MED ORDER — LORAZEPAM 2 MG/ML IJ SOLN
INTRAMUSCULAR | Status: AC
  Filled 2023-01-26: qty 1

## 2023-01-26 MED ORDER — DILTIAZEM HCL 25 MG/5ML IV SOLN
10.0000 mg | INTRAVENOUS | Status: DC | PRN
  Administered 2023-01-26: 10 mg via INTRAVENOUS

## 2023-01-26 MED ORDER — METOPROLOL TARTRATE 5 MG/5ML IV SOLN
5.0000 mg | INTRAVENOUS | Status: DC | PRN
  Administered 2023-01-26: 5 mg via INTRAVENOUS
  Administered 2023-01-26: 10 mg via INTRAVENOUS
  Administered 2023-01-26: 5 mg via INTRAVENOUS

## 2023-01-26 NOTE — Progress Notes (Signed)
Dr. Duke Salvia called due to heart rate sustained at 75 bpm despite medicating with PRN 10 mg metoprolol IV and 10 mg diltiazem IV.  Verbal order received for 1 mg ativan once due to home medication of xanax for anxiety.  Order placed and IV ativan administered.

## 2023-01-26 NOTE — Progress Notes (Signed)
Came to CT scanner and attached to monitor. Medicated with 10 mg Metoprolol IV while on CT scanner table.  Despite best efforts, patient's HR remains sustained between 83-85 bpm sinus rhythm.  CT coronary aborted at this time.  Patient understands that despite medications, heart rate remains sustained at a greater rate than needed for quality images.   Dr. Duke Salvia, Christeen Douglas RN/ Huntley Dec RN (RN Navigator) notified of aborted scan.   IV access removed and patient escorted to waiting room where husband is waiting to transport home.

## 2023-01-26 NOTE — Progress Notes (Signed)
   01/26/23 1155  Unsuccessful Nursing Procedure/Treatment  Type of Nursing Procedure/Treatment Peripheral IV insertion (CT Tech)  Number of attempts 2 (US guided IV attempted by CT Tech)  Location of attempt right ac, left ac    3 attempts for IV access for CT Coronary.  IV team consult order placed as patient would like to proceed with CT scan and need IV access to continue.

## 2023-01-31 NOTE — Telephone Encounter (Signed)
Per the nurse navator, the patient has been over to have CT several times and her heart rate was to high after taking metoprolol or coronar. She was also a hard IV stick. Is there other testing needed instead?

## 2023-02-01 ENCOUNTER — Telehealth: Payer: Self-pay | Admitting: Nurse Practitioner

## 2023-02-01 ENCOUNTER — Other Ambulatory Visit: Payer: Self-pay

## 2023-02-01 DIAGNOSIS — R5383 Other fatigue: Secondary | ICD-10-CM

## 2023-02-01 DIAGNOSIS — R002 Palpitations: Secondary | ICD-10-CM

## 2023-02-01 DIAGNOSIS — R079 Chest pain, unspecified: Secondary | ICD-10-CM

## 2023-02-01 DIAGNOSIS — R42 Dizziness and giddiness: Secondary | ICD-10-CM

## 2023-02-01 NOTE — Telephone Encounter (Signed)
Suggest if we cannot get the Coronary CTA done then may be stress PET would be helpful.  Bryan Lemma, MD

## 2023-02-01 NOTE — Telephone Encounter (Signed)
Order placed for Cardiac PET stress test.

## 2023-02-01 NOTE — Telephone Encounter (Signed)
   Patient Name: Lisa Waters  DOB: 1980/09/29 MRN: [id number]  Primary Cardiologist: Bryan Lemma, MD  Patient unable to complete coronary CT angiogram due to elevated heart rate.  Discussed with Dr. Herbie Baltimore who recommends cardiac PET stress test as alternative.  Discussed with patient, she is agreeable to proceed.  Patient is unable to exercise on treadmill due to dizziness, elevated heart rate, and fatigue.  Shared Decision Making/Informed Consent The risks [chest pain, shortness of breath, cardiac arrhythmias, dizziness, blood pressure fluctuations, myocardial infarction, stroke/transient ischemic attack, nausea, vomiting, allergic reaction, radiation exposure, metallic taste sensation and life-threatening complications (estimated to be 1 in 10,000)], benefits (risk stratification, diagnosing coronary artery disease, treatment guidance) and alternatives of a cardiac PET stress test were discussed in detail with [name] and she agrees to proceed.  I will have our covering team reach out to patient to order and schedule cardiac PET stress test for chest chest pain with exertion, dizziness, palpitations, and fatigue.  Joylene Grapes, NP 02/01/2023, 8:30 AM

## 2023-02-01 NOTE — Telephone Encounter (Signed)
Noted. Will place order.

## 2023-02-02 DIAGNOSIS — N920 Excessive and frequent menstruation with regular cycle: Secondary | ICD-10-CM | POA: Diagnosis not present

## 2023-02-15 ENCOUNTER — Ambulatory Visit (INDEPENDENT_AMBULATORY_CARE_PROVIDER_SITE_OTHER): Payer: BC Managed Care – PPO

## 2023-02-15 ENCOUNTER — Ambulatory Visit (HOSPITAL_BASED_OUTPATIENT_CLINIC_OR_DEPARTMENT_OTHER): Payer: BC Managed Care – PPO

## 2023-02-15 DIAGNOSIS — R002 Palpitations: Secondary | ICD-10-CM | POA: Diagnosis not present

## 2023-02-15 DIAGNOSIS — R42 Dizziness and giddiness: Secondary | ICD-10-CM

## 2023-02-15 DIAGNOSIS — I340 Nonrheumatic mitral (valve) insufficiency: Secondary | ICD-10-CM

## 2023-02-15 DIAGNOSIS — I341 Nonrheumatic mitral (valve) prolapse: Secondary | ICD-10-CM

## 2023-02-15 DIAGNOSIS — I471 Supraventricular tachycardia, unspecified: Secondary | ICD-10-CM

## 2023-02-15 LAB — ECHOCARDIOGRAM COMPLETE
Area-P 1/2: 5.97 cm2
MV M vel: 3.9 m/s
MV Peak grad: 60.7 mmHg
S' Lateral: 2.4 cm

## 2023-02-19 ENCOUNTER — Telehealth: Payer: Self-pay

## 2023-02-19 NOTE — Telephone Encounter (Signed)
Spoke with pt. Pt was notified of echo results. Pt will continue current medication and f/u as planned.  

## 2023-03-02 ENCOUNTER — Encounter (HOSPITAL_COMMUNITY): Payer: Self-pay

## 2023-03-05 ENCOUNTER — Telehealth (HOSPITAL_COMMUNITY): Payer: Self-pay | Admitting: Emergency Medicine

## 2023-03-05 DIAGNOSIS — Z3202 Encounter for pregnancy test, result negative: Secondary | ICD-10-CM | POA: Diagnosis not present

## 2023-03-05 DIAGNOSIS — N920 Excessive and frequent menstruation with regular cycle: Secondary | ICD-10-CM | POA: Diagnosis not present

## 2023-03-05 NOTE — Telephone Encounter (Signed)
Attempted to call patient regarding upcoming cardiac PET appointment. Left message on voicemail with name and callback number Sara Wallace RN Navigator Cardiac Imaging [facility] [phone] Office [phone] Cell  

## 2023-03-06 ENCOUNTER — Encounter (HOSPITAL_COMMUNITY)
Admission: RE | Admit: 2023-03-06 | Discharge: 2023-03-06 | Disposition: A | Discharge status: Home / Self Care | Payer: BC Managed Care – PPO | Source: Ambulatory Visit | Attending: Nurse Practitioner | Admitting: Nurse Practitioner

## 2023-03-06 DIAGNOSIS — R002 Palpitations: Secondary | ICD-10-CM | POA: Diagnosis not present

## 2023-03-06 DIAGNOSIS — R42 Dizziness and giddiness: Secondary | ICD-10-CM | POA: Diagnosis not present

## 2023-03-06 DIAGNOSIS — J432 Centrilobular emphysema: Secondary | ICD-10-CM | POA: Diagnosis not present

## 2023-03-06 DIAGNOSIS — R5383 Other fatigue: Secondary | ICD-10-CM | POA: Insufficient documentation

## 2023-03-06 DIAGNOSIS — R079 Chest pain, unspecified: Secondary | ICD-10-CM | POA: Insufficient documentation

## 2023-03-06 DIAGNOSIS — I2510 Atherosclerotic heart disease of native coronary artery without angina pectoris: Secondary | ICD-10-CM | POA: Diagnosis not present

## 2023-03-06 DIAGNOSIS — R590 Localized enlarged lymph nodes: Secondary | ICD-10-CM | POA: Diagnosis not present

## 2023-03-06 DIAGNOSIS — R918 Other nonspecific abnormal finding of lung field: Secondary | ICD-10-CM | POA: Diagnosis not present

## 2023-03-06 MED ORDER — RUBIDIUM RB82 GENERATOR (RUBYFILL)
18.7900 | PACK | Freq: Once | INTRAVENOUS | Status: AC
  Administered 2023-03-06: 18.79 via INTRAVENOUS

## 2023-03-06 MED ORDER — RUBIDIUM RB82 GENERATOR (RUBYFILL)
18.9900 | PACK | Freq: Once | INTRAVENOUS | Status: AC
  Administered 2023-03-06: 18.99 via INTRAVENOUS

## 2023-03-06 MED ORDER — REGADENOSON 0.4 MG/5ML IV SOLN
0.4000 mg | Freq: Once | INTRAVENOUS | Status: AC
  Administered 2023-03-06: 0.4 mg via INTRAVENOUS

## 2023-03-06 MED ORDER — REGADENOSON 0.4 MG/5ML IV SOLN
INTRAVENOUS | Status: AC
  Filled 2023-03-06: qty 5

## 2023-03-06 NOTE — Progress Notes (Signed)
Pts HR elevated before admin of regadenoson due to anxiety and claustrophobia. Verbal de-escalation methods and mediation was attempted to calm the patient. Pt agreed to proceed with the exam.  Pt tolerated exam without incident; vital signs within normal limits; pt denies lightheadedness or dizziness; encouraged to drink caffeine, eat meal; pt ambulatory to lobby steady gait noted w/ husband   Rockwell Alexandria RN Navigator Cardiac Imaging [facility] [phone] Office  [phone] Cell

## 2023-03-07 LAB — NM PET CT CARDIAC PERFUSION MULTI W/ABSOLUTE BLOODFLOW
MBFR: 2.3
Nuc Rest EF: 54 %
Nuc Stress EF: 57 %
Rest MBF: 1.9 ml/g/min
Rest Nuclear Isotope Dose: 18.8 mCi
ST Depression (mm): 0 mm
Stress MBF: 4.37 ml/g/min
Stress Nuclear Isotope Dose: 19 mCi
TID: 0.96

## 2023-03-14 ENCOUNTER — Encounter: Payer: Self-pay | Admitting: Nurse Practitioner

## 2023-03-14 ENCOUNTER — Ambulatory Visit: Payer: BC Managed Care – PPO | Attending: Nurse Practitioner | Admitting: Nurse Practitioner

## 2023-03-14 VITALS — BP 110/72 | HR 84 | Ht 67.0 in | Wt 164.2 lb

## 2023-03-14 DIAGNOSIS — R42 Dizziness and giddiness: Secondary | ICD-10-CM

## 2023-03-14 DIAGNOSIS — D649 Anemia, unspecified: Secondary | ICD-10-CM

## 2023-03-14 DIAGNOSIS — R002 Palpitations: Secondary | ICD-10-CM

## 2023-03-14 DIAGNOSIS — I7300 Raynaud's syndrome without gangrene: Secondary | ICD-10-CM

## 2023-03-14 DIAGNOSIS — E785 Hyperlipidemia, unspecified: Secondary | ICD-10-CM

## 2023-03-14 DIAGNOSIS — I10 Essential (primary) hypertension: Secondary | ICD-10-CM

## 2023-03-14 DIAGNOSIS — I341 Nonrheumatic mitral (valve) prolapse: Secondary | ICD-10-CM | POA: Diagnosis not present

## 2023-03-14 DIAGNOSIS — R0609 Other forms of dyspnea: Secondary | ICD-10-CM

## 2023-03-14 DIAGNOSIS — I340 Nonrheumatic mitral (valve) insufficiency: Secondary | ICD-10-CM

## 2023-03-14 MED ORDER — ROSUVASTATIN CALCIUM 10 MG PO TABS: 10.0000 mg | ORAL_TABLET | Freq: Every day | ORAL | 3 refills | Status: AC

## 2023-03-14 NOTE — Patient Instructions (Signed)
Medication Instructions:  Start Crestor 10 mg daily  *If you need a refill on your cardiac medications before your next appointment, please call your pharmacy*   Lab Work: NONE ordered at this time of appointment     Testing/Procedures: NONE ordered at this time of appointment     Follow-Up: At [facility], you and your health needs are our priority.  As part of our continuing mission to provide you with exceptional heart care, we have created designated Provider Care Teams.  These Care Teams include your primary Cardiologist (physician) and Advanced Practice Providers (APPs -  Physician Assistants and Nurse Practitioners) who all work together to provide you with the care you need, when you need it.  We recommend signing up for the patient portal called "MyChart".  Sign up information is provided on this After Visit Summary.  MyChart is used to connect with patients for Virtual Visits (Telemedicine).  Patients are able to view lab/test results, encounter notes, upcoming appointments, etc.  Non-urgent messages can be sent to your provider as well.   To learn more about what you can do with MyChart, go to [email or web address].    Your next appointment:   6 month(s)  Provider:   Bryan Lemma, MD

## 2023-03-14 NOTE — Progress Notes (Signed)
Office Visit    Patient Name: Lisa Waters Date of Encounter: 03/14/2023  Primary Care Provider:  Ronnald Nian, MD Primary Cardiologist:  Bryan Lemma, MD  Chief Complaint    42 year old female with a history of palpitations, SVT, moderate mitral valve regurgitation with mitral valve prolapse, hypertension, hyperlipidemia, breast cancer s/p bilateral mastectomy, and Raynaud's phenomenon who presents for follow-up related to hypertension and palpitations.   Past Medical History    Past Medical History:  Diagnosis Date   Anxiety    Asthma    mild intermittent as of 07/2017 - just when patient gets sick   Depression    Female infertility    GERD (gastroesophageal reflux disease)    Hx - no current problems, no meds   H/O amaurosis fugax Fall 2012   Resolved, no current problems, Hx-MRI of brain/brain stem: no acute abnormality, small area of encephalomalacia left superior cerebellum that could be prior trauma, infection, or lacunar infarct   History of Hodgkin's lymphoma 2007   in remission, sees [facility], prior with Dr. Francee Gentile; prior chemotherapy and radiation therapy; sees yearly in December, no current problems   Hyperlipidemia 07/2017   diet control, no meds   Hypothyroidism    Moderate mitral regurgitation by prior echocardiogram 09/2010   10/2015: EF 55-60%. Normal WM. Normal D Fxn. Bilateral MVP w/ Mod MR (very eccentric). Normal LA, RV & RA. Normal PAP.   MVP (mitral valve prolapse) 09/2010   Dr. Herbie Baltimore, [facility]; a) Echo 3/'12: Mod MR; EF 60-65%; b) TEE 5/'12: Nl LV Fxn, EF 60-65%, mild, holosystolic prolapse of the medial anterior leaflet. Mod MR . c) Echo 3/'15: Moderate, holosystolic prolapse of  medial. Mod MR d) TM Stress Echo (@ [facility]) Nl Lv Fxn, Mild-Mod MR @ Res0 --> Mod MR at HR 181 bpm (7:22 min, 10.10 METS) w/p WMA   Oral ulcer    Palpitations 10/2012   holter monitor, Dr. Herbie Baltimore; no arrhythmia, no current problems   PONV (postoperative nausea and vomiting)     Past Surgical History:  Procedure Laterality Date   ANTERIOR CERVICAL DECOMP/DISCECTOMY FUSION N/A 09/09/2021   Procedure: C6-7 ANTERIOR CERVICAL DISCECTOMY FUSION, ALLOGRAFT, PLATE;  Surgeon: Eldred Manges, MD;  Location: [facility] OR;  Service: Orthopedics;  Laterality: N/A;   APPENDECTOMY  1993   BREAST ENHANCEMENT SURGERY  2002   COLONOSCOPY  07/2014   Dr. Loreta Ave - normal   DILATATION & CURETTAGE/HYSTEROSCOPY WITH MYOSURE N/A 01/29/2018   Procedure: DILATATION & CURETTAGE/HYSTEROSCOPY WITH MYOSURE POLYPECTOMY;  Surgeon: Vick Frees, MD;  Location: [facility] ORS;  Service: Gynecology;  Laterality: N/A;   ESOPHAGOGASTRODUODENOSCOPY  04/11/2013   [facility] Dr. Loreta Ave   FOOT SURGERY Bilateral    on toes   INSERTION CENTRAL VENOUS ACCESS DEVICE W/ SUBCUTANEOUS PORT  2006   LYMPH NODE BIOPSY  2006   under right arm   MASTECTOMY Bilateral 02/2020   double mastectomy. Has had 9 surgical procedures on breast since then.   NM MYOCAR PERF WALL MOTION  12/2010   persantine myoview - moderate breast attenuation (fixed mid-distal anterior defect), EF 68%, low risk scan   RHINOPLASTY  2007   deviated septum   TEE WITHOUT CARDIOVERSION  11/2010   Nl LV Fxn, EF 60-65%, mild, holosystolic prolapse of the medial anterior leaflet. Mod MR .    TRANSTHORACIC ECHOCARDIOGRAM  3/'14; 3/'15   LVEF 60-65%, wall motion normal, LV function normal, moderate holosystolic MVP (medial the middle scallop of the posterior leaflet),  moderate regurgitation (directed posteriorly), no shunt -- stable over 2 years   TRANSTHORACIC ECHOCARDIOGRAM  11/03/2021   EF 60 to 65%.  Normal LV size and function.  Normal diastolic parameters.  Normal RV size and function.  Normal aortic valve.  Normal RAP.  Myxomatous mitral valve with anterior leaflet prolapse, moderate MR-posteriorly directed eccentric MR jet:   TRANSTHORACIC ECHOCARDIOGRAM  10/22/2019   EF 55 to 60%.  Mild LVH.  GRII DD.  Elevated LAP.  Mild LA dilation.   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  Normal aortic valve.  (Stable   WISDOM TOOTH EXTRACTION      Allergies  Allergies  Allergen Reactions   Clindamycin Nausea And Vomiting    Pt states she took this medication at home and started having heartburn followed by N/V.    Iohexol Hives   Pindolol Other (See Comments)    Avoid non selective beta blockers due to dyspnea   Codeine Itching   Fish Allergy Itching    Mahi Mahi   Iodinated Contrast Media Rash    Has received since rash developed without pre-meds without development of rash.     Labs/Other Studies Reviewed    The following studies were reviewed today:  Cardiac Studies & Procedures     STRESS TESTS  NM PET CT CARDIAC PERFUSION MULTI W/ABSOLUTE BLOODFLOW 03/06/2023  Narrative   The study is normal. The study is low risk.   LV perfusion is normal. There is no evidence of ischemia. There is no evidence of infarction.   Rest left ventricular function is normal. Rest EF: 54%. Stress left ventricular function is normal. Stress EF: 57%. End diastolic cavity size is normal. End systolic cavity size is normal.   Myocardial blood flow was computed to be 1.56ml/g/min at rest and 4.106ml/g/min at stress. Global myocardial blood flow reserve was 2.30 and was normal.   Coronary calcium was present on the attenuation correction CT images. Mild coronary calcifications were present. Coronary calcifications were present in the left anterior descending artery distribution(s).   Electronically signed by Epifanio Lesches, MD  CLINICAL DATA:  This over-read does not include interpretation of cardiac or coronary anatomy or pathology. The Cardiac PET CT interpretation by the cardiologist is attached.  COMPARISON:  05/31/2021 chest CT angiogram  FINDINGS: Cardiovascular: Normal heart size. No significant pericardial effusion/thickening. Left anterior descending coronary atherosclerosis. Mildly atherosclerotic nonaneurysmal thoracic aorta.  Normal caliber pulmonary arteries.  Mediastinum/Nodes: Unremarkable esophagus. No pathologically enlarged mediastinal or hilar lymph nodes, noting limited sensitivity for the detection of hilar adenopathy on this noncontrast study.  Lungs/Pleura: No pneumothorax. No pleural effusion. Moderate paraseptal and centrilobular emphysema with diffuse bronchial wall thickening. No acute consolidative airspace disease, lung masses or significant pulmonary nodules.  Upper abdomen: No acute abnormality.  Musculoskeletal:  No aggressive appearing focal osseous lesions.  IMPRESSION: 1. No acute pulmonary disease. 2. Moderate paraseptal and centrilobular emphysema with diffuse bronchial wall thickening, suggesting COPD. 3. Aortic Atherosclerosis (ICD10-I70.0) and Emphysema (ICD10-J43.9).   Electronically Signed By: Delbert Phenix M.D. On: 03/06/2023 14:13   ECHOCARDIOGRAM  ECHOCARDIOGRAM COMPLETE 02/15/2023  Narrative ECHOCARDIOGRAM REPORT    Patient Name:   Lisa Waters Date of Exam: 02/15/2023 Medical Rec #:  [id number]    Height:       67.0 in Accession #:    [id number]   Weight:       160.2 lb Date of Birth:  [birth year]    BSA:          1.840 m  Patient Age:    42 years     BP:           140/85 mmHg Patient Gender: F            HR:           114 bpm. Exam Location:  Outpatient  Procedure: 3D Echo, 2D Echo, Color Doppler, Cardiac Doppler and Strain Analysis  Indications:    Mitral Regurgitation  History:        Patient has prior history of Echocardiogram examinations, most recent 10/27/2021. Risk Factors:Former Smoker, Dyslipidemia and Hypertension. Mitral Valve Prolapse; Hogkins Disease; s/p bilateral mastectomy.  Sonographer:    Jeryl Columbia RDCS Referring Phys: [phone] Nashawn Hillock C Rolan Wrightsman   Sonographer Comments: Image acquisition challenging due to breast implants. IMPRESSIONS   1. Left ventricular ejection fraction, by estimation, is 60 to 65%. The left ventricle has normal  function. The left ventricle has no regional wall motion abnormalities. Left ventricular diastolic parameters were normal. 2. Right ventricular systolic function is normal. The right ventricular size is normal. Tricuspid regurgitation signal is inadequate for assessing PA pressure. 3. Mild MVP. Mild mitral valve regurgitation. 4. The aortic valve was not well visualized. Aortic valve regurgitation is not visualized. 5. The inferior vena cava is normal in size with greater than 50% respiratory variability, suggesting right atrial pressure of 3 mmHg.  Comparison(s): No significant change from prior study.  FINDINGS Left Ventricle: Left ventricular ejection fraction, by estimation, is 60 to 65%. The left ventricle has normal function. The left ventricle has no regional wall motion abnormalities. The left ventricular internal cavity size was normal in size. There is no left ventricular hypertrophy. Left ventricular diastolic parameters were normal.  Right Ventricle: The right ventricular size is normal. Right ventricular systolic function is normal. Tricuspid regurgitation signal is inadequate for assessing PA pressure.  Left Atrium: Left atrial size was normal in size.  Right Atrium: Right atrial size was normal in size.  Pericardium: There is no evidence of pericardial effusion.  Mitral Valve: Mild MVP. Mild mitral valve regurgitation.  Tricuspid Valve: Tricuspid valve regurgitation is not demonstrated.  Aortic Valve: The aortic valve was not well visualized. Aortic valve regurgitation is not visualized.  Pulmonic Valve: Pulmonic valve regurgitation is not visualized.  Aorta: The aortic root and ascending aorta are structurally normal, with no evidence of dilitation.  Venous: The inferior vena cava is normal in size with greater than 50% respiratory variability, suggesting right atrial pressure of 3 mmHg.  IAS/Shunts: No atrial level shunt detected by color flow Doppler.   LEFT  VENTRICLE PLAX 2D LVIDd:         4.29 cm   Diastology LVIDs:         2.40 cm   LV e' medial:    13.50 cm/s LV PW:         0.97 cm   LV E/e' medial:  4.8 LV IVS:        0.74 cm   LV e' lateral:   11.40 cm/s LVOT diam:     1.90 cm   LV E/e' lateral: 5.7 LVOT Area:     2.84 cm   RIGHT VENTRICLE RV Basal diam:  2.35 cm RV Mid diam:    3.14 cm RV S prime:     10.40 cm/s TAPSE (M-mode): 2.1 cm  LEFT ATRIUM             Index        RIGHT ATRIUM  Index LA diam:        3.10 cm 1.68 cm/m   RA Area:     8.46 cm LA Vol (A2C):   29.2 ml 15.87 ml/m  RA Volume:   13.60 ml 7.39 ml/m LA Vol (A4C):   28.5 ml 15.49 ml/m LA Biplane Vol: 29.7 ml 16.14 ml/m  AORTA Ao Root diam: 2.80 cm Ao Asc diam:  3.00 cm  MITRAL VALVE MV Area (PHT): 5.97 cm    SHUNTS MV Decel Time: 127 msec    Systemic Diam: 1.90 cm MR Peak grad: 60.7 mmHg MR Vmax:      389.50 cm/s MV E velocity: 64.75 cm/s MV A velocity: 68.70 cm/s MV E/A ratio:  0.94  [place] signed by Carolan Clines Signature Date/Time: 02/15/2023/4:03:58 PM    Final    MONITORS  LONG TERM MONITOR (3-14 DAYS) 01/01/2023  Narrative   ~14 D Zio Patch Monitor: May0June 2024   Predominant underlying Rhythm is Sinus: HR 74-171 bpm, Avg 99 bpm.   Rare PACs and PVCs.  Symptoms noted with sinus rhythm plus or minus PVCs.  Oftentimes sinus tachycardia.   6 short atrial runs: Fastest 8 beats (3.4 sec) HR 113-164 bpm, avg 134 bpm; longest 16 beats (9 Sec) HR 89-141 bpm, avg 109 bpm.   No Sustained Arrhythmias: Atrial Tachycardia (AT), Supraventricular Tachycardia (SVT), Atrial Fibrillation (A-Fib), Atrial Flutter (A-Flutter), Sustained Ventricular Tachycardia (VT)  Overall pretty normal study.  The only thing prominent is that the average heart rate is almost 100 bpm.  This is faster than normally expected.  Can consider either increasing Toprol dose to 100 mg or converting from amlodipine to diltiazem to allow for additional  rate control.  Also.  Stressed importance of adequate hydration.  Bryan Lemma, MD          Recent Labs: 11/06/2022: ALT 25; TSH 2.940 12/08/2022: Hemoglobin 12.7; Platelets 252 01/15/2023: BUN 8; Creatinine, Ser 0.57; Potassium 4.0; Sodium 141  Recent Lipid Panel    Component Value Date/Time   CHOL 280 (H) 11/06/2022 1440   TRIG 72 11/06/2022 1440   HDL 125 11/06/2022 1440   CHOLHDL 2.2 11/06/2022 1440   CHOLHDL 2.5 07/26/2017 1128   VLDL 11 04/01/2015 0001   LDLCALC 144 (H) 11/06/2022 1440   LDLCALC 154 (H) 07/26/2017 1128    History of Present Illness    43 year old female with the above past medical history including palpitations, SVT, moderate mitral valve regurgitation with mitral valve prolapse, hypertension, hyperlipidemia, breast cancer s/p bilateral mastectomy, and Raynaud's phenomenon.    Myoview in 2012 was low risk.  She has followed with Dr. Herbie Baltimore primarily for ongoing monitoring of mitral valve regurgitation with mitral valve prolapse.  Cardiac monitor in the setting of palpitations in 03/2019 showed sinus rhythm with rare PACs and PVCs. Echocardiogram in 10/2021 showed EF 60 to 65%, normal LV function, RV systolic function, myxomatous mitral valve with anterior leaflet prolapse, moderate mitral valve regurgitation, posteriorly directed eccentric MR jet, suggesting some improvement in mitral valve regurgitation. Repeat echocardiogram was recommended in 2 years.  She contacted our office on 08/11/2022 with concerns for dizziness, headaches, hypotension.  She reported exercising twice a day for 40 minutes at a time, drinking a gallon of water daily, and following a restricted calorie diet.  She was advised to liberalize salt intake, metoprolol and valsartan were discontinued.  She was last seen in the office on 12/08/2022 and was stable from a cardiac standpoint.  Her BP was elevated.  She noted generalized fatigue, occasional lightheadedness, palpitations with associated chest  discomfort.  She was restarted on olmesartan 20 mg daily.  14-day ZIO in 12/2022 setting of lightheadedness, palpitations revealed predominantly sinus rhythm, 6 runs of SVT, rare PACs and PVCs.  Metoprolol was increased.  She was last seen in the office on 01/15/2023 and noted labile BP, intermittent palpitations, dizziness, elevated resting heart rate, and intermittent chest pain both at rest and with exertion.  Metoprolol was increased, she was started on amlodipine.  Coronary CTA was ordered but unable to be completed due to resting tachycardia.  She underwent cardiac PET stress test in 02/2023 which was low risk, no evidence of ischemia, EF 54%, with mild coronary calcification.  There was evidence of moderate paraseptal and centrilobular emphysema with diffuse bronchial wall thickening, suggesting COPD. Carotid Dopplers showed minimal wall thickening/plaque.  Repeat echocardiogram in 02/2023 showed EF 60 to 65%, normal LV function, no RWMA, normal RV, mild mitral valve prolapse, mild mitral valve regurgitation, no significant change from prior study.   She presents today for follow-up accompanied by her husband.  Since her last visit she has been stable from a cardiac standpoint.  She is no longer taking olmesartan.  BP has been well-controlled generally with the exception of increased BP in the setting of intermittent anxiety.  She continues to note occasional dyspnea on exertion, palpitations, chest tightness, overall improved with increased metoprolol dosing.  She is aware of the chronic lung changes noted on her cardiac PET stress test, and is interested in speaking to a pulmonologist.  She continues to note significant anxiety, not improved with increased Zoloft.   Home Medications    Current Outpatient Medications  Medication Sig Dispense Refill   ALPRAZolam (XANAX) 0.25 MG tablet TAKE 1 TABLET BY MOUTH TWICE A DAY AS NEEDED FOR ANXIETY (Patient taking differently: Take 0.25 mg by mouth as needed.) 20  tablet 1   amLODipine (NORVASC) 2.5 MG tablet Take 1 tablet (2.5 mg total) by mouth daily. 90 tablet 3   ibuprofen (ADVIL) 800 MG tablet Take 800 mg by mouth 3 (three) times daily as needed (menstrual pain.).     IRON, FERROUS GLUCONATE, PO Take by mouth daily.     metoprolol succinate (TOPROL XL) 50 MG 24 hr tablet Take 1 tablet (50 mg total) by mouth daily. Take with or immediately following a meal. 90 tablet 3   Multiple Vitamin (MULTIVITAMIN ADULT PO) Take 1 Capful by mouth daily.     naproxen (NAPROSYN) 500 MG tablet Take 500 mg by mouth as needed.     olmesartan (BENICAR) 20 MG tablet Take 1 tablet (20 mg total) by mouth daily. 90 tablet 3   rosuvastatin (CRESTOR) 10 MG tablet Take 1 tablet (10 mg total) by mouth daily. 90 tablet 3   sertraline (ZOLOFT) 100 MG tablet Take 1-1/2 pills daily 45 tablet 1   SYNTHROID 88 MCG tablet Take 1 tablet (88 mcg total) by mouth daily. 90 tablet 3   Vitamin D, Ergocalciferol, (DRISDOL) 1.25 MG (50000 UNIT) CAPS capsule TAKE 1 CAPSULE BY MOUTH ONE TIME PER WEEK 12 capsule 2   chlorhexidine (PERIDEX) 0.12 % solution Use as directed 15 mLs in the mouth or throat 2 (two) times daily. Swish and spit (Patient not taking: Reported on 03/14/2023) 120 mL 0   Iron-Folic Acid-Vit B12 (IRON FORMULA PO) Take by mouth daily. (Patient not taking: Reported on 03/14/2023)     metoprolol tartrate (LOPRESSOR) 100 MG tablet Take 1 tablet  2 hours prior to procedure. (Patient not taking: Reported on 03/14/2023) 1 tablet 0   predniSONE (DELTASONE) 10 MG tablet Take 5 tablets 13 hours prior to CT, take 5 tablets 7 hours prior to CT, take 5 tablets 1 hour prior to CT (Patient not taking: Reported on 03/14/2023) 15 tablet 0   No current facility-administered medications for this visit.     Review of Systems    She denies pnd, orthopnea, n, v, dizziness, syncope, edema, weight gain, or early satiety. All other systems reviewed and are otherwise negative except as noted above.    Physical Exam    VS:  BP 110/72 (BP Location: Left Arm, Patient Position: Sitting, Cuff Size: Normal)   Pulse 84   Ht 5\' 7"  (1.702 m)   Wt 164 lb 3.2 oz (74.5 kg)   SpO2 100%   BMI 25.72 kg/m   GEN: Well nourished, well developed, in no acute distress. HEENT: normal. Neck: Supple, no JVD, carotid bruits, or masses. Cardiac: RRR, no murmurs, rubs, or gallops. No clubbing, cyanosis, edema.  Radials/DP/PT 2+ and equal bilaterally.  Respiratory:  Respirations regular and unlabored, clear to auscultation bilaterally. GI: Soft, nontender, nondistended, BS + x 4. MS: no deformity or atrophy. Skin: warm and dry, no rash. Neuro:  Strength and sensation are intact. Psych: Normal affect.  Accessory Clinical Findings    ECG personally reviewed by me today -    - no EKG in office today.    Lab Results  Component Value Date   WBC 3.4 12/08/2022   HGB 12.7 12/08/2022   HCT 40.5 12/08/2022   MCV 90 12/08/2022   PLT 252 12/08/2022   Lab Results  Component Value Date   CREATININE 0.57 01/15/2023   BUN 8 01/15/2023   NA 141 01/15/2023   K 4.0 01/15/2023   CL 98 01/15/2023   CO2 25 01/15/2023   Lab Results  Component Value Date   ALT 25 11/06/2022   AST 42 (H) 11/06/2022   ALKPHOS 66 11/06/2022   BILITOT 0.2 11/06/2022   Lab Results  Component Value Date   CHOL 280 (H) 11/06/2022   HDL 125 11/06/2022   LDLCALC 144 (H) 11/06/2022   TRIG 72 11/06/2022   CHOLHDL 2.2 11/06/2022    No results found for: "HGBA1C"  Assessment & Plan    1. Hypertension: BP well controlled.  Olmesartan was recently discontinued.  Continue current antihypertensive regimen.    2. Coronary artery calcium/Palpitations/PSVT/dizziness/chest discomfort/fatigue: Prior monitor in 2020 showed rare PVCs and PACs.  Recent cardiac monitor showed predominantly sinus rhythm, runs of SVT.  Palpitations have improved overall with increased metoprolol.  Cardiac PET stress test was negative for ischemia (did show  evidence of mild coronary artery calcium), echocardiogram was reassuring.  Continue to monitor symptoms.  If she continues to note persistently elevated heart rate, worsening palpitations, consider increasing metoprolol dosing.  For now, continue metoprolol at current dose.    3. Mitral valve regurgitation/mitral valve prolapse: Repeat echocardiogram in 02/2023 showed EF 60 to 65%, normal LV function, no RWMA, normal RV, mild mitral valve prolapse, mild mitral valve regurgitation, no significant change from prior study.  Consider repeat echo in 2 years, or as clinically indicated.   4. Hyperlipidemia: LDL was 144 in 10/2022, above goal.  She is no longer taking Crestor. Given evidence of coronary calcium on recent cardiac PET stress test, will start Crestor 10 mg daily.  Will repeat fasting lipids, LFTs in 6 to 8 weeks.  Encouraged ongoing lifestyle modifications with diet and exercise.    5. Anemia: She has been taking iron supplements. Managed per PCP.   6.  Raynaud's phenomenon: Symptoms improved with initiation of amlodipine.   7. Anxiety: She continues to note frequent symptoms of anxiety despite increased Zoloft.  I suspect this could be contributing some to her palpitations and labile BP.  Recommend following up with PCP for possible medication adjustment.  8.  Dyspnea on exertion/possible COPD: She notes intermittent dyspnea on exertion.  Recent cardiac PET stress test showed evidence of moderate paraseptal and centrilobular emphysema with diffuse bronchial wall thickening, suggesting COPD.  Recent cardiac workup reassuring.  Through shared decision making, will refer to pulmonology for further evaluation.  9. Disposition: Follow-up in 6 months, sooner if needed.      Joylene Grapes, NP 03/14/2023, 10:50 AM

## 2023-03-30 DIAGNOSIS — Z30430 Encounter for insertion of intrauterine contraceptive device: Secondary | ICD-10-CM | POA: Diagnosis not present

## 2023-03-30 DIAGNOSIS — Z3202 Encounter for pregnancy test, result negative: Secondary | ICD-10-CM | POA: Diagnosis not present

## 2023-04-17 ENCOUNTER — Institutional Professional Consult (permissible substitution): Payer: BC Managed Care – PPO | Admitting: Internal Medicine

## 2023-04-17 ENCOUNTER — Encounter: Payer: Self-pay | Admitting: Internal Medicine

## 2023-04-27 DIAGNOSIS — Z30431 Encounter for routine checking of intrauterine contraceptive device: Secondary | ICD-10-CM | POA: Diagnosis not present

## 2023-05-11 ENCOUNTER — Telehealth: Payer: BC Managed Care – PPO | Admitting: Family Medicine

## 2023-05-11 DIAGNOSIS — M545 Low back pain, unspecified: Secondary | ICD-10-CM

## 2023-05-11 MED ORDER — NAPROXEN 500 MG PO TABS
500.0000 mg | ORAL_TABLET | Freq: Two times a day (BID) | ORAL | 0 refills | Status: AC
Start: 2023-05-11 — End: ?

## 2023-05-11 MED ORDER — CYCLOBENZAPRINE HCL 10 MG PO TABS
10.0000 mg | ORAL_TABLET | Freq: Three times a day (TID) | ORAL | 0 refills | Status: AC | PRN
Start: 2023-05-11 — End: ?

## 2023-05-11 NOTE — Progress Notes (Signed)
E-Visit for Back Pain   We are sorry that you are not feeling well.  Here is how we plan to help!  Based on what you have shared with me it looks like you mostly have acute back pain.  Acute back pain is defined as musculoskeletal pain that can resolve in 1-3 weeks with conservative treatment.  I have prescribed Naprosyn 500 mg take one by mouth twice a day non-steroid anti-inflammatory (NSAID) as well as Flexeril 10 mg every eight hours as needed which is a muscle relaxer  Some patients experience stomach irritation or in increased heartburn with anti-inflammatory drugs.  Please keep in mind that muscle relaxer's can cause fatigue and should not be taken while at work or driving.  Back pain is very common.  The pain often gets better over time.  The cause of back pain is usually not dangerous.  Most people can learn to manage their back pain on their own.  Home Care Stay active.  Start with short walks on flat ground if you can.  Try to walk farther each day. Do not sit, drive or stand in one place for more than 30 minutes.  Do not stay in bed. Do not avoid exercise or work.  Activity can help your back heal faster. Be careful when you bend or lift an object.  Bend at your knees, keep the object close to you, and do not twist. Sleep on a firm mattress.  Lie on your side, and bend your knees.  If you lie on your back, put a pillow under your knees. Only take medicines as told by your doctor. Put ice on the injured area. Put ice in a plastic bag Place a towel between your skin and the bag Leave the ice on for 15-20 minutes, 3-4 times a day for the first 2-3 days. 210 After that, you can switch between ice and heat packs. Ask your doctor about back exercises or massage. Avoid feeling anxious or stressed.  Find good ways to deal with stress, such as exercise.  Get Help Right Way If: Your pain does not go away with rest or medicine. Your pain does not go away in 1 week. You have new  problems. You do not feel well. The pain spreads into your legs. You cannot control when you poop (bowel movement) or pee (urinate) You feel sick to your stomach (nauseous) or throw up (vomit) You have belly (abdominal) pain. You feel like you may pass out (faint). If you develop a fever.  Make Sure you: Understand these instructions. Will watch your condition Will get help right away if you are not doing well or get worse.  Your e-visit answers were reviewed by a board certified advanced clinical practitioner to complete your personal care plan.  Depending on the condition, your plan could have included both over the counter or prescription medications.  If there is a problem please reply  once you have received a response from your provider.  Your safety is important to [place].  If you have drug allergies check your prescription carefully.    You can use MyChart to ask questions about today's visit, request a non-urgent call back, or ask for a work or school excuse for 24 hours related to this e-Visit. If it has been greater than 24 hours you will need to follow up with your provider, or enter a new e-Visit to address those concerns.  You will get an e-mail in the next two days asking about  your experience.  I hope that your e-visit has been valuable and will speed your recovery. Thank you for using e-visits.  I provided 5 minutes of non face-to-face time during this encounter for chart review, medication and order placement, as well as and documentation.

## 2023-07-08 ENCOUNTER — Telehealth: Payer: BC Managed Care – PPO | Admitting: Family Medicine

## 2023-07-08 DIAGNOSIS — R6889 Other general symptoms and signs: Secondary | ICD-10-CM | POA: Diagnosis not present

## 2023-07-08 MED ORDER — OSELTAMIVIR PHOSPHATE 75 MG PO CAPS: 75.0000 mg | ORAL_CAPSULE | Freq: Two times a day (BID) | ORAL | 0 refills | Status: AC

## 2023-07-08 MED ORDER — ALBUTEROL SULFATE HFA 108 (90 BASE) MCG/ACT IN AERS: 2.0000 | INHALATION_SPRAY | Freq: Four times a day (QID) | RESPIRATORY_TRACT | 0 refills | Status: AC | PRN

## 2023-07-08 NOTE — Progress Notes (Signed)
E visit for Flu like symptoms   We are sorry that you are not feeling well.  Here is how we plan to help! Based on what you have shared with me it looks like you may have a respiratory virus that may be influenza.  Influenza or "the flu" is   an infection caused by a respiratory virus. The flu virus is highly contagious and persons who did not receive their yearly flu vaccination may "catch" the flu from close contact.  We have anti-viral medications to treat the viruses that cause this infection. They are not a "cure" and only shorten the course of the infection. These prescriptions are most effective when they are given within the first 2 days of "flu" symptoms. Antiviral medication are indicated if you have a high risk of complications from the flu. You should  also consider an antiviral medication if you are in close contact with someone who is at risk. These medications can help patients avoid complications from the flu  but have side effects that you should know. Possible side effects from Tamiflu or oseltamivir include nausea, vomiting, diarrhea, dizziness, headaches, eye redness, sleep problems or other respiratory symptoms. You should not take Tamiflu if you have an allergy to oseltamivir or any to the ingredients in Tamiflu.  Based upon your symptoms and potential risk factors I have prescribed Oseltamivir (Tamiflu).  It has been sent to your designated pharmacy.  You will take one 75 mg capsule orally twice a day for the next 5 days. I have also sent an inhaler.   ANYONE WHO HAS FLU SYMPTOMS SHOULD: Stay home. The flu is highly contagious and going out or to work exposes others! Be sure to drink plenty of fluids. Water is fine as well as fruit juices, sodas and electrolyte beverages. You may want to stay away from caffeine or alcohol. If you are nauseated, try taking small sips of liquids. How do you know if you are getting enough fluid? Your urine should be a pale yellow or almost  colorless. Get rest. Taking a steamy shower or using a humidifier may help nasal congestion and ease sore throat pain. Using a saline nasal spray works much the same way. Cough drops, hard candies and sore throat lozenges may ease your cough. Line up a caregiver. Have someone check on you regularly.   GET HELP RIGHT AWAY IF: You cannot keep down liquids or your medications. You become short of breath Your fell like you are going to pass out or loose consciousness. Your symptoms persist after you have completed your treatment plan MAKE SURE YOU  Understand these instructions. Will watch your condition. Will get help right away if you are not doing well or get worse.  Your e-visit answers were reviewed by a board certified advanced clinical practitioner to complete your personal care plan.  Depending on the condition, your plan could have included both over the counter or prescription medications.  If there is a problem please reply  once you have received a response from your provider.  Your safety is important to [place].  If you have drug allergies check your prescription carefully.    You can use MyChart to ask questions about today's visit, request a non-urgent call back, or ask for a work or school excuse for 24 hours related to this e-Visit. If it has been greater than 24 hours you will need to follow up with your provider, or enter a new e-Visit to address those concerns.  You  will get an e-mail in the next two days asking about your experience.  I hope that your e-visit has been valuable and will speed your recovery. Thank you for using e-visits.    have provided 5 minutes of non face to face time during this encounter for chart review and documentation.

## 2023-08-07 ENCOUNTER — Ambulatory Visit: Payer: BC Managed Care – PPO | Admitting: Orthopaedic Surgery

## 2023-08-07 ENCOUNTER — Other Ambulatory Visit (INDEPENDENT_AMBULATORY_CARE_PROVIDER_SITE_OTHER): Payer: BC Managed Care – PPO

## 2023-08-07 VITALS — BP 109/71 | HR 90

## 2023-08-07 DIAGNOSIS — M25562 Pain in left knee: Secondary | ICD-10-CM

## 2023-08-07 DIAGNOSIS — G8929 Other chronic pain: Secondary | ICD-10-CM

## 2023-08-07 NOTE — Progress Notes (Unsigned)
Office Visit Note   Patient: Lisa Waters           Date of Birth: 1981/02/11           MRN: [id number] Visit Date: 08/07/2023              Requested by: Ronnald Nian, MD [address] [place],  [place] [address] PCP: Ronnald Nian, MD   Assessment & Plan: Visit Diagnoses:  1. Chronic pain of left knee     Plan: Will set patient up for some physical therapy to go over some of the workout activity she has been doing with and without weights therapist reviewed her techniques.  Treatment for knee pain problems with likely chondromalacia patella.  She can follow-up with me in 5 weeks.  Follow-Up Instructions: Return in about 5 weeks (around 09/11/2023).   Orders:  Orders Placed This Encounter  Procedures   XR KNEE 3 VIEW LEFT   Ambulatory referral to Physical Therapy   No orders of the defined types were placed in this encounter.     Procedures: No procedures performed   Clinical Data: No additional findings.   Subjective: Chief Complaint  Patient presents with   Left Knee - Pain    HPI 43 year old female 2 years post cervical fusion by me ceric concerning left knee knee pain.  Patient's had problems with stairs squatting down on the floor.  She still does squats with workout using weights.  Denies any locking of her knee.  No groin pain no associated back pain.  More problems going up and down stairs.  No problems with turning or twisting.  Review of Systems history of Hodgkin's lymphoma in remission.  Previous mastectomy and reconstruction.   Objective: Vital Signs: BP 109/71 (BP Location: Left Arm, Patient Position: Sitting)   Pulse 90   Physical Exam Constitutional:      Appearance: She is well-developed.  HENT:     Head: Normocephalic.     Right Ear: External ear normal.     Left Ear: External ear normal. There is no impacted cerumen.  Eyes:     Pupils: Pupils are equal, round, and reactive to light.  Neck:     Thyroid: No thyromegaly.      Trachea: No tracheal deviation.  Cardiovascular:     Rate and Rhythm: Normal rate.  Pulmonary:     Effort: Pulmonary effort is normal.  Abdominal:     Palpations: Abdomen is soft.  Musculoskeletal:     Cervical back: No rigidity.  Skin:    General: Skin is warm and dry.  Neurological:     Mental Status: She is alert and oriented to person, place, and time.  Psychiatric:        Behavior: Behavior normal.     Ortho Exam negative logroll no sciatic notch tenderness.  No trochanteric bursal tenderness negative logroll hips knee and ankle jerk are intact.  Minimal medial joint line tenderness left knee.  No pain with hyperextension negative pivot shift negative anterior drawer negative posterior drawer.  Small tiny medial palpable Baker's cyst.  Hamstrings are normal.  Normal patella tracking no crepitus with knee flexion or extension.  Specialty Comments:  No specialty comments available.  Imaging: XR KNEE 3 VIEW LEFT Result Date: 08/07/2023 Three-view x-rays left knee obtained including AP both knees lateral left knee and sunrise left knee.  Normal anatomy no joint space narrowing.  No knee effusion.  Normal bone anatomy.  Tiny marginal osteophytes without subchondral  sclerosis or cyst formation. Impression: Negative left knee radiographs for acute changes.    PMFS History: Patient Active Problem List   Diagnosis Date Noted   History of mastectomy, bilateral 11/06/2022   History of fusion of cervical spine 06/28/2022   Bilateral hand numbness 06/14/2022   History of cervical discectomy 11/01/2021   Cervical spinal stenosis 09/09/2021   Hyperlipidemia LDL goal <100 08/31/2020   Essential hypertension 08/30/2020   Vitamin D deficiency 11/12/2018   Vaccine counseling 07/26/2017   Routine general medical examination at a health care facility 05/30/2016   Other specified hypothyroidism 04/01/2015   Major depression, recurrent (HCC) 05/25/2013   Allergic rhinitis 01/20/2013    Female infertility of other specified origin 04/09/2012   Raynauds disease 06/29/2011   Hodgkin's disease in remission (HCC) 12/29/2010   MVP (mitral valve prolapse) 12/29/2010   Past Medical History:  Diagnosis Date   Anxiety    Asthma    mild intermittent as of 07/2017 - just when patient gets sick   Depression    Female infertility    GERD (gastroesophageal reflux disease)    Hx - no current problems, no meds   H/O amaurosis fugax Fall 2012   Resolved, no current problems, Hx-MRI of brain/brain stem: no acute abnormality, small area of encephalomalacia left superior cerebellum that could be prior trauma, infection, or lacunar infarct   History of Hodgkin's lymphoma 2007   in remission, sees [facility], prior with Dr. Francee Gentile; prior chemotherapy and radiation therapy; sees yearly in December, no current problems   Hyperlipidemia 07/2017   diet control, no meds   Hypothyroidism    Moderate mitral regurgitation by prior echocardiogram 09/2010   10/2015: EF 55-60%. Normal WM. Normal D Fxn. Bilateral MVP w/ Mod MR (very eccentric). Normal LA, RV & RA. Normal PAP.   MVP (mitral valve prolapse) 09/2010   Dr. Herbie Baltimore, [facility]; a) Echo 3/'12: Mod MR; EF 60-65%; b) TEE 5/'12: Nl LV Fxn, EF 60-65%, mild, holosystolic prolapse of the medial anterior leaflet. Mod MR . c) Echo 3/'15: Moderate, holosystolic prolapse of  medial. Mod MR d) TM Stress Echo (@ [facility]) Nl Lv Fxn, Mild-Mod MR @ Res0 --> Mod MR at HR 181 bpm (7:22 min, 10.10 METS) w/p WMA   Oral ulcer    Palpitations 10/2012   holter monitor, Dr. Herbie Baltimore; no arrhythmia, no current problems   PONV (postoperative nausea and vomiting)     Family History  Problem Relation Age of Onset   Skin cancer Father    Cancer Paternal Grandmother        metastasis   Alcohol abuse Mother    Emphysema Paternal Grandfather        was a smoker, heart problems   Diabetes Maternal Aunt    OCD Maternal Aunt    Bipolar disorder Maternal Aunt    Stroke  Maternal Grandmother    Cancer Maternal Aunt        skin   Heart disease Neg Hx    Hypertension Neg Hx    Hyperlipidemia Neg Hx     Past Surgical History:  Procedure Laterality Date   ANTERIOR CERVICAL DECOMP/DISCECTOMY FUSION N/A 09/09/2021   Procedure: C6-7 ANTERIOR CERVICAL DISCECTOMY FUSION, ALLOGRAFT, PLATE;  Surgeon: Eldred Manges, MD;  Location: [facility] OR;  Service: Orthopedics;  Laterality: N/A;   APPENDECTOMY  1993   BREAST ENHANCEMENT SURGERY  2002   COLONOSCOPY  07/2014   Dr. Loreta Ave - normal   DILATATION & CURETTAGE/HYSTEROSCOPY WITH MYOSURE N/A  01/29/2018   Procedure: DILATATION & CURETTAGE/HYSTEROSCOPY WITH MYOSURE POLYPECTOMY;  Surgeon: Vick Frees, MD;  Location: [facility];  Service: Gynecology;  Laterality: N/A;   ESOPHAGOGASTRODUODENOSCOPY  04/11/2013   [facility] Dr. Loreta Ave   FOOT SURGERY Bilateral    on toes   INSERTION CENTRAL VENOUS ACCESS DEVICE W/ SUBCUTANEOUS PORT  2006   LYMPH NODE BIOPSY  2006   under right arm   MASTECTOMY Bilateral 02/2020   double mastectomy. Has had 9 surgical procedures on breast since then.   NM MYOCAR PERF WALL MOTION  12/2010   persantine myoview - moderate breast attenuation (fixed mid-distal anterior defect), EF 68%, low risk scan   RHINOPLASTY  2007   deviated septum   TEE WITHOUT CARDIOVERSION  11/2010   Nl LV Fxn, EF 60-65%, mild, holosystolic prolapse of the medial anterior leaflet. Mod MR .    TRANSTHORACIC ECHOCARDIOGRAM  3/'14; 3/'15   LVEF 60-65%, wall motion normal, LV function normal, moderate holosystolic MVP (medial the middle scallop of the posterior leaflet), moderate regurgitation (directed posteriorly), no shunt -- stable over 2 years   TRANSTHORACIC ECHOCARDIOGRAM  11/03/2021   EF 60 to 65%.  Normal LV size and function.  Normal diastolic parameters.  Normal RV size and function.  Normal aortic valve.  Normal RAP.  Myxomatous mitral valve with anterior leaflet prolapse, moderate MR-posteriorly directed  eccentric MR jet:   TRANSTHORACIC ECHOCARDIOGRAM  10/22/2019   EF 55 to 60%.  Mild LVH.  GRII DD.  Elevated LAP.  Mild LA dilation.  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  Normal aortic valve.  (Stable   WISDOM TOOTH EXTRACTION     Social History   Occupational History   Occupation: works in Hydrologist: [employer]  Tobacco Use   Smoking status: Former    Current packs/day: 0.00    Average packs/day: 1 pack/day for 10.0 years (10.0 ttl pk-yrs)    Types: Cigarettes    Start date: 11/08/1999    Quit date: 11/07/2009    Years since quitting: 13.7   Smokeless tobacco: Never  Vaping Use   Vaping status: Never Used  Substance and Sexual Activity   Alcohol use: Yes    Alcohol/week: 1.0 - 2.0 standard drink of alcohol    Types: 1 - 2 Cans of beer per week    Comment: social Beer/Liquor   Drug use: No   Sexual activity: Yes    Partners: Male    Birth control/protection: None

## 2023-08-15 ENCOUNTER — Ambulatory Visit: Payer: BC Managed Care – PPO | Admitting: Rehabilitative and Restorative Service Providers"

## 2023-08-15 ENCOUNTER — Encounter: Payer: Self-pay | Admitting: Rehabilitative and Restorative Service Providers"

## 2023-08-15 DIAGNOSIS — R262 Difficulty in walking, not elsewhere classified: Secondary | ICD-10-CM

## 2023-08-15 DIAGNOSIS — M6281 Muscle weakness (generalized): Secondary | ICD-10-CM

## 2023-08-15 DIAGNOSIS — R600 Localized edema: Secondary | ICD-10-CM

## 2023-08-15 DIAGNOSIS — M25562 Pain in left knee: Secondary | ICD-10-CM

## 2023-08-15 DIAGNOSIS — G8929 Other chronic pain: Secondary | ICD-10-CM

## 2023-08-15 NOTE — Therapy (Signed)
OUTPATIENT PHYSICAL THERAPY LOWER EXTREMITY EVALUATION   Patient Name: Lisa Waters MRN: [id number] DOB:19-Sep-1980, 43 y.o., female Today's Date: 08/15/2023  END OF SESSION:  PT End of Session - 08/15/23 1655     Visit Number 1    Number of Visits 11    Date for PT Re-Evaluation 11/07/23    Progress Note Due on Visit 11    PT Start Time 1600    PT Stop Time 1651    PT Time Calculation (min) 51 min    Activity Tolerance Patient tolerated treatment well;No increased pain;Patient limited by pain    Behavior During Therapy [facility] for tasks assessed/performed             Past Medical History:  Diagnosis Date   Anxiety    Asthma    mild intermittent as of 07/2017 - just when patient gets sick   Depression    Female infertility    GERD (gastroesophageal reflux disease)    Hx - no current problems, no meds   H/O amaurosis fugax Fall 2012   Resolved, no current problems, Hx-MRI of brain/brain stem: no acute abnormality, small area of encephalomalacia left superior cerebellum that could be prior trauma, infection, or lacunar infarct   History of Hodgkin's lymphoma 2007   in remission, sees [facility], prior with Dr. Francee Gentile; prior chemotherapy and radiation therapy; sees yearly in December, no current problems   Hyperlipidemia 07/2017   diet control, no meds   Hypothyroidism    Moderate mitral regurgitation by prior echocardiogram 09/2010   10/2015: EF 55-60%. Normal WM. Normal D Fxn. Bilateral MVP w/ Mod MR (very eccentric). Normal LA, RV & RA. Normal PAP.   MVP (mitral valve prolapse) 09/2010   Dr. Herbie Baltimore, [facility]; a) Echo 3/'12: Mod MR; EF 60-65%; b) TEE 5/'12: Nl LV Fxn, EF 60-65%, mild, holosystolic prolapse of the medial anterior leaflet. Mod MR . c) Echo 3/'15: Moderate, holosystolic prolapse of  medial. Mod MR d) TM Stress Echo (@ [facility]) Nl Lv Fxn, Mild-Mod MR @ Res0 --> Mod MR at HR 181 bpm (7:22 min, 10.10 METS) w/p WMA   Oral ulcer    Palpitations 10/2012   holter monitor,  Dr. Herbie Baltimore; no arrhythmia, no current problems   PONV (postoperative nausea and vomiting)    Past Surgical History:  Procedure Laterality Date   ANTERIOR CERVICAL DECOMP/DISCECTOMY FUSION N/A 09/09/2021   Procedure: C6-7 ANTERIOR CERVICAL DISCECTOMY FUSION, ALLOGRAFT, PLATE;  Surgeon: Eldred Manges, MD;  Location: [facility] OR;  Service: Orthopedics;  Laterality: N/A;   APPENDECTOMY  1993   BREAST ENHANCEMENT SURGERY  2002   COLONOSCOPY  07/2014   Dr. Loreta Ave - normal   DILATATION & CURETTAGE/HYSTEROSCOPY WITH MYOSURE N/A 01/29/2018   Procedure: DILATATION & CURETTAGE/HYSTEROSCOPY WITH MYOSURE POLYPECTOMY;  Surgeon: Vick Frees, MD;  Location: [facility] ORS;  Service: Gynecology;  Laterality: N/A;   ESOPHAGOGASTRODUODENOSCOPY  04/11/2013   [facility] Dr. Loreta Ave   FOOT SURGERY Bilateral    on toes   INSERTION CENTRAL VENOUS ACCESS DEVICE W/ SUBCUTANEOUS PORT  2006   LYMPH NODE BIOPSY  2006   under right arm   MASTECTOMY Bilateral 02/2020   double mastectomy. Has had 9 surgical procedures on breast since then.   NM MYOCAR PERF WALL MOTION  12/2010   persantine myoview - moderate breast attenuation (fixed mid-distal anterior defect), EF 68%, low risk scan   RHINOPLASTY  2007   deviated septum   TEE WITHOUT CARDIOVERSION  11/2010  Nl LV Fxn, EF 60-65%, mild, holosystolic prolapse of the medial anterior leaflet. Mod MR .    TRANSTHORACIC ECHOCARDIOGRAM  3/'14; 3/'15   LVEF 60-65%, wall motion normal, LV function normal, moderate holosystolic MVP (medial the middle scallop of the posterior leaflet), moderate regurgitation (directed posteriorly), no shunt -- stable over 2 years   TRANSTHORACIC ECHOCARDIOGRAM  11/03/2021   EF 60 to 65%.  Normal LV size and function.  Normal diastolic parameters.  Normal RV size and function.  Normal aortic valve.  Normal RAP.  Myxomatous mitral valve with anterior leaflet prolapse, moderate MR-posteriorly directed eccentric MR jet:   TRANSTHORACIC  ECHOCARDIOGRAM  10/22/2019   EF 55 to 60%.  Mild LVH.  GRII DD.  Elevated LAP.  Mild LA dilation.  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  Normal aortic valve.  (Stable   WISDOM TOOTH EXTRACTION     Patient Active Problem List   Diagnosis Date Noted   History of mastectomy, bilateral 11/06/2022   History of fusion of cervical spine 06/28/2022   Bilateral hand numbness 06/14/2022   History of cervical discectomy 11/01/2021   Cervical spinal stenosis 09/09/2021   Hyperlipidemia LDL goal <100 08/31/2020   Essential hypertension 08/30/2020   Vitamin D deficiency 11/12/2018   Vaccine counseling 07/26/2017   Routine general medical examination at a health care facility 05/30/2016   Other specified hypothyroidism 04/01/2015   Major depression, recurrent (HCC) 05/25/2013   Allergic rhinitis 01/20/2013   Female infertility of other specified origin 04/09/2012   Raynauds disease 06/29/2011   Hodgkin's disease in remission (HCC) 12/29/2010   MVP (mitral valve prolapse) 12/29/2010    PCP: Ronnald Nian, MD  REFERRING PROVIDER: Eldred Manges, MD  REFERRING DIAG:  Diagnosis  [phone] (ICD-10-CM) - Chronic pain of left knee    THERAPY DIAG:  Difficulty in walking, not elsewhere classified - Plan: PT plan of care cert/re-cert  Muscle weakness (generalized) - Plan: PT plan of care cert/re-cert  Chronic pain of left knee - Plan: PT plan of care cert/re-cert  Localized edema - Plan: PT plan of care cert/re-cert  Rationale for Evaluation and Treatment: Rehabilitation  ONSET DATE: 6 months  SUBJECTIVE:   SUBJECTIVE STATEMENT: Lisa Waters notes "popping, cracking" and difficulty with stairs (particularly down).  Symptoms have been getting gradually worse over the past 6 months and she has continued to workout during this time.  She is seeking physical therapy because she does not want to continue to worsen to the point that she is unable to exercise recreationally.  PERTINENT  HISTORY: Anxiety, asthma, depression, h/o amaurosis fugas, Hodgkin's Lymphoma, hypothoidism, hyperlipidemia, HTN, MVP, bilateral foot surgery, Raynauds disease, ACDF PAIN:  Are you having pain? Yes: NPRS scale: 0-5/10 this week Pain location: Left knee Pain description: Burning, throbbing, "stuck, jolt" Aggravating factors: Stairs, standing on concrete, lunch time and after, sore at bedtime Relieving factors: Ice, get off her feet  PRECAUTIONS: Cervical  RED FLAGS: None   WEIGHT BEARING RESTRICTIONS: No  FALLS:  Has patient fallen in last 6 months? 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  DIAGNOSTIC  FINDINGS: Three-view x-rays left knee obtained including AP both knees lateral left  knee and sunrise left knee.  Normal anatomy no joint space narrowing.  No  knee effusion.  Normal bone anatomy.  Tiny marginal osteophytes without  subchondral sclerosis or cyst formation.   Impression: Negative left knee radiographs for acute changes.  PATIENT SURVEYS:  FOTO 63 (Goal 77 in 11 visits)  COGNITION: Overall cognitive status: Within functional limits for tasks assessed     SENSATION: WFL  EDEMA:  Noticed at night, none noted today  MUSCLE LENGTH: Hamstrings: Right 30 deg; Left 30 deg Quadriceps: Right 120 deg; Left 120 deg Gastrocnemius: Right 6 deg; Left 6 deg  POSTURE: No Significant postural limitations  PALPATION: No significant tenderness noted of the inferior patellar pole, medial or lateral joint line  LOWER EXTREMITY ROM:  Active  ROM Left/Right 08/15/2023   Hip flexion    Hip extension    Hip abduction    Hip adduction    Hip internal rotation    Hip external rotation    Knee flexion 151/155   Knee extension 0/0   Ankle dorsiflexion    Ankle plantarflexion    Ankle inversion    Ankle eversion     (Blank rows = not tested)  LOWER EXTREMITY MMT: Thigh girth was assessed 15 cm proximal to the superior patellar pole  MMT Left/Right in cm  Left eval  Hip flexion    Hip extension    Hip abduction    Hip adduction    Hip internal rotation    Hip external rotation    Knee flexion 16.0/24.7 pounds   Knee extension 49.5/50.0 cm & 43.8/64.3 pounds   Ankle dorsiflexion    Ankle plantarflexion    Ankle inversion    Ankle eversion     (Blank rows = not tested)  GAIT: Distance walked: 100 feet Assistive device utilized: None Level of assistance: Complete Independence Comments: Lisa Waters notes increasing knee pain throughout the day, particularly with standing and walking.  No significant deviations noted while walking in the clinic.                                                                                                                                TREATMENT DATE:  08/15/2023 Supine hamstrings stretch with ankle dorsiflexion 2 x 20 seconds Seated straight leg raises 3 sets of 5 with 2-1/2 pound weights (1 pound next visit)  Functional Activities: Reviewed exam findings, discussed normal quadriceps and hamstrings strength values for 43 year old female would be approximately 94 pounds for quadriceps and 63 pounds for hamstrings.  Discussed the importance of avoiding overuse including recommendations to avoid lower extremity cardiovascular exercise on a daily basis and reducing that to 3 times a week to help avoid overuse while focusing on thigh strengthening, particularly quadriceps.   PATIENT EDUCATION:  Education details: See above Person educated: Patient Education method: Explanation, Facilities manager,  Verbal cues, and Handouts Education  comprehension: verbalized understanding, returned demonstration, verbal cues required, and needs further education  HOME EXERCISE PROGRAM: Access Code: [email or web address] URL: [email or web address] Date: 08/15/2023 Prepared by: Pauletta Browns  Exercises - Supine Hamstring Stretch  - 2-3 x daily - 7 x weekly - 1 sets - 4-5 reps - 10-15 seconds hold - Small Range Straight Leg Raise  - 2 x daily - 7 x weekly - 5 sets - 5 reps - 3 seconds hold  ASSESSMENT:  CLINICAL IMPRESSION: Patient is a 43 y.o. female who was seen today for physical therapy evaluation and treatment for  Diagnosis  M25.562,G89.29 (ICD-10-CM) - Chronic pain of left knee  .  Lisa Waters's symptoms have been getting worse over the previous 6 months and she is continue to exercise on a daily basis including lower extremity cardio.  Avoiding overuse while focusing on proximal leg strength (particularly quadriceps) and working on flexibility impairments should allow Lisa Waters to meet the below listed goals.  Her's may be a slightly longer rehabilitation secondary to the symptoms being of 6+ months duration.  OBJECTIVE IMPAIRMENTS: decreased activity tolerance, decreased endurance, decreased knowledge of condition, difficulty walking, decreased strength, decreased safety awareness, impaired perceived functional ability, impaired flexibility, and pain.   ACTIVITY LIMITATIONS: standing, squatting, stairs, and locomotion level  PARTICIPATION LIMITATIONS: community activity, occupation, and workouts  PERSONAL FACTORS: ACDF, anxiety, asthma, depression, h/o amaurosis fugas, Hodgkin's Lymphoma, hypothoidism, hyperlipidemia, HTN, MVP, bilateral foot surgery, Raynauds disease are also affecting patient's functional outcome.   REHAB POTENTIAL: Good  CLINICAL DECISION MAKING: Stable/uncomplicated  EVALUATION COMPLEXITY: Low   GOALS: Goals reviewed with patient? Yes  SHORT TERM GOALS: Target date:  09/12/2023 Lisa Waters will be independent with her day 1 home exercise program Baseline: Started 08/15/2023 Goal status: INITIAL  2.  Improve left quadriceps strength to at least 60 pounds Baseline: 43.8 pounds Goal status: INITIAL  3.  Improve bilateral hamstrings flexibility to at least 40 degrees and ankle dorsiflexion active range of motion to at least 10 degrees Baseline: 30 degrees and 6 degrees respectively Goal status: INITIAL  LONG TERM GOALS: Target date: 11/07/2023  Improve FOTO to 77 in 11 visits Baseline: 63 Goal status: INITIAL  2.  Improve left knee pain to consistently 0-2/10 on the VAS Baseline: 0-5/10 Goal status: INITIAL  3.  Improve bilateral quadriceps strength to at least 80 pounds Baseline: See objective Goal status: INITIAL  4.  Improve bilateral hamstrings strength to at least 55 pounds Baseline: See objective Goal status: INITIAL  5.  Lisa Waters will be able to return to lower extremity cardiovascular work at least 5 days a week without increasing her symptoms above 2/10 on the VAS Baseline: Unable at evaluation Goal status: INITIAL  6.  Lisa Waters will be independent with her long-term home maintenance exercise program at discharge Baseline: Started 08/15/2023 Goal status: INITIAL   PLAN:  PT FREQUENCY: 1x/week  PT DURATION: 12 weeks  PLANNED INTERVENTIONS: 97110-Therapeutic exercises, 97530- Therapeutic activity, 97112- Neuromuscular re-education, 97535- Self Care, [id number]- Manual therapy, [id number]- Vasopneumatic device, Patient/Family education, Balance training, Stair training, and Cryotherapy  PLAN FOR NEXT SESSION: Emphasis on thigh strength, particularly quadriceps, while avoiding overuse.   Cherlyn Cushing, PT, MPT 08/15/2023, 5:29 PM

## 2023-08-22 ENCOUNTER — Encounter: Payer: Self-pay | Admitting: Rehabilitative and Restorative Service Providers"

## 2023-08-22 ENCOUNTER — Ambulatory Visit: Payer: BC Managed Care – PPO | Admitting: Rehabilitative and Restorative Service Providers"

## 2023-08-22 DIAGNOSIS — R600 Localized edema: Secondary | ICD-10-CM | POA: Diagnosis not present

## 2023-08-22 DIAGNOSIS — G8929 Other chronic pain: Secondary | ICD-10-CM

## 2023-08-22 DIAGNOSIS — R262 Difficulty in walking, not elsewhere classified: Secondary | ICD-10-CM

## 2023-08-22 DIAGNOSIS — M6281 Muscle weakness (generalized): Secondary | ICD-10-CM | POA: Diagnosis not present

## 2023-08-22 DIAGNOSIS — M25562 Pain in left knee: Secondary | ICD-10-CM

## 2023-08-22 NOTE — Therapy (Addendum)
 OUTPATIENT PHYSICAL THERAPY LOWER EXTREMITY TREATMENT  PHYSICAL THERAPY DISCHARGE SUMMARY  Visits from Start of Care: 2  Current functional level related to goals / functional outcomes: See note   Remaining deficits: See note   Education / Equipment: HEP   Patient agrees to discharge. Patient goals were established. Patient is being discharged due to not returning since the last visit.   Patient Name: Lisa Waters MRN: [id number] DOB:[birth year], 43 y.o., female Today's Date: 08/22/2023  END OF SESSION:  PT End of Session - 08/22/23 0947     Visit Number 2    Number of Visits 11    Date for PT Re-Evaluation 11/07/23    Progress Note Due on Visit 11    PT Start Time 0936    PT Stop Time 1016    PT Time Calculation (min) 40 min    Activity Tolerance Patient tolerated treatment well;No increased pain;Patient limited by pain    Behavior During Therapy [facility] for tasks assessed/performed              Past Medical History:  Diagnosis Date   Anxiety    Asthma    mild intermittent as of 07/2017 - just when patient gets sick   Depression    Female infertility    GERD (gastroesophageal reflux disease)    Hx - no current problems, no meds   H/O amaurosis fugax Fall 2012   Resolved, no current problems, Hx-MRI of brain/brain stem: no acute abnormality, small area of encephalomalacia left superior cerebellum that could be prior trauma, infection, or lacunar infarct   History of Hodgkin's lymphoma 2007   in remission, sees [facility], prior with Dr. Zanetta Stewart; prior chemotherapy and radiation therapy; sees yearly in December, no current problems   Hyperlipidemia 07/2017   diet control, no meds   Hypothyroidism    Moderate mitral regurgitation by prior echocardiogram 09/2010   10/2015: EF 55-60%. Normal WM. Normal D Fxn. Bilateral MVP w/ Mod MR (very eccentric). Normal LA, RV & RA. Normal PAP.   MVP (mitral valve prolapse) 09/2010   Dr. Anner, [facility]; a) Echo 3/'12: Mod MR; EF  60-65%; b) TEE 5/'12: Nl LV Fxn, EF 60-65%, mild, holosystolic prolapse of the medial anterior leaflet. Mod MR . c) Echo 3/'15: Moderate, holosystolic prolapse of  medial. Mod MR d) TM Stress Echo (@ [facility]) Nl Lv Fxn, Mild-Mod MR @ Res0 --> Mod MR at HR 181 bpm (7:22 min, 10.10 METS) w/p WMA   Oral ulcer    Palpitations 10/2012   holter monitor, Dr. Anner; no arrhythmia, no current problems   PONV (postoperative nausea and vomiting)    Past Surgical History:  Procedure Laterality Date   ANTERIOR CERVICAL DECOMP/DISCECTOMY FUSION N/A 09/09/2021   Procedure: C6-7 ANTERIOR CERVICAL DISCECTOMY FUSION, ALLOGRAFT, PLATE;  Surgeon: Barbarann Oneil BROCKS, MD;  Location: [facility] OR;  Service: Orthopedics;  Laterality: N/A;   APPENDECTOMY  1993   BREAST ENHANCEMENT SURGERY  2002   COLONOSCOPY  07/2014   Dr. Kristie - normal   DILATATION & CURETTAGE/HYSTEROSCOPY WITH MYOSURE N/A 01/29/2018   Procedure: DILATATION & CURETTAGE/HYSTEROSCOPY WITH MYOSURE POLYPECTOMY;  Surgeon: Linnell Devere BRAVO, MD;  Location: [facility] ORS;  Service: Gynecology;  Laterality: N/A;   ESOPHAGOGASTRODUODENOSCOPY  04/11/2013   [facility] Dr. kristie   FOOT SURGERY Bilateral    on toes   INSERTION CENTRAL VENOUS ACCESS DEVICE W/ SUBCUTANEOUS PORT  2006   LYMPH NODE BIOPSY  2006   under right arm   MASTECTOMY  Bilateral 02/2020   double mastectomy. Has had 9 surgical procedures on breast since then.   NM MYOCAR PERF WALL MOTION  12/2010   persantine myoview - moderate breast attenuation (fixed mid-distal anterior defect), EF 68%, low risk scan   RHINOPLASTY  2007   deviated septum   TEE WITHOUT CARDIOVERSION  11/2010   Nl LV Fxn, EF 60-65%, mild, holosystolic prolapse of the medial anterior leaflet. Mod MR .    TRANSTHORACIC ECHOCARDIOGRAM  3/'14; 3/'15   LVEF 60-65%, wall motion normal, LV function normal, moderate holosystolic MVP (medial the middle scallop of the posterior leaflet), moderate regurgitation (directed posteriorly),  no shunt -- stable over 2 years   TRANSTHORACIC ECHOCARDIOGRAM  11/03/2021   EF 60 to 65%.  Normal LV size and function.  Normal diastolic parameters.  Normal RV size and function.  Normal aortic valve.  Normal RAP.  Myxomatous mitral valve with anterior leaflet prolapse, moderate MR-posteriorly directed eccentric MR jet:   TRANSTHORACIC ECHOCARDIOGRAM  10/22/2019   EF 55 to 60%.  Mild LVH.  GRII DD.  Elevated LAP.  Mild LA dilation.  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  Normal aortic valve.  (Stable   WISDOM TOOTH EXTRACTION     Patient Active Problem List   Diagnosis Date Noted   History of mastectomy, bilateral 11/06/2022   History of fusion of cervical spine 06/28/2022   Bilateral hand numbness 06/14/2022   History of cervical discectomy 11/01/2021   Cervical spinal stenosis 09/09/2021   Hyperlipidemia LDL goal <100 08/31/2020   Essential hypertension 08/30/2020   Vitamin D  deficiency 11/12/2018   Vaccine counseling 07/26/2017   Routine general medical examination at a health care facility 05/30/2016   Other specified hypothyroidism 04/01/2015   Major depression, recurrent (HCC) 05/25/2013   Allergic rhinitis 01/20/2013   Female infertility of other specified origin 04/09/2012   Raynauds disease 06/29/2011   Hodgkin's disease in remission (HCC) 12/29/2010   MVP (mitral valve prolapse) 12/29/2010    PCP: Norleen JAYSON Jobs, MD  REFERRING PROVIDER: Oneil JAYSON Herald, MD  REFERRING DIAG:  Diagnosis  [phone] (ICD-10-CM) - Chronic pain of left knee    THERAPY DIAG:  Difficulty in walking, not elsewhere classified  Muscle weakness (generalized)  Chronic pain of left knee  Localized edema  Rationale for Evaluation and Treatment: Rehabilitation  ONSET DATE: 6 months  SUBJECTIVE:   SUBJECTIVE STATEMENT: Lisa Waters reports at least 50 straight leg raises per day since evaluation.  We discussed focusing more on the bike vs treadmill at the home gym.  Lisa Waters notes popping,  cracking and difficulty with stairs (particularly down).  Symptoms have been getting gradually worse over the past 6 months and she has continued to workout during this time.  She is seeking physical therapy because she does not want to continue to worsen to the point that she is unable to exercise recreationally.  PERTINENT HISTORY: Anxiety, asthma, depression, h/o amaurosis fugas, Hodgkin's Lymphoma, hypothoidism, hyperlipidemia, HTN, MVP, bilateral foot surgery, Raynauds disease, ACDF PAIN:  Are you having pain? Yes: NPRS scale: 0-5/10 this week Pain location: Left knee Pain description: Burning, throbbing, stuck, jolt Aggravating factors: Stairs, standing on concrete, lunch time and after, sore at bedtime Relieving factors: Ice, get off her feet  PRECAUTIONS: Cervical  RED FLAGS: None   WEIGHT BEARING RESTRICTIONS: No  FALLS:  Has patient fallen in last 6 months? No  LIVING ENVIRONMENT: Lives with: lives with their family and lives with their spouse Lives in: House/apartment Stairs: Difficulty up  and particularly down Has following equipment at home: None  OCCUPATION: Owns a holiday representative business  PLOF: Independent  PATIENT GOALS: Be able to do normal activities without being limited by the left knee  NEXT MD VISIT: 09/11/2023 at 1 PM  OBJECTIVE:  Note: Objective measures were completed at Evaluation unless otherwise noted.  DIAGNOSTIC FINDINGS: Three-view x-rays left knee obtained including AP both knees lateral left  knee and sunrise left knee.  Normal anatomy no joint space narrowing.  No  knee effusion.  Normal bone anatomy.  Tiny marginal osteophytes without  subchondral sclerosis or cyst formation.   Impression: Negative left knee radiographs for acute changes.  PATIENT SURVEYS:  FOTO 63 (Goal 77 in 11 visits)  COGNITION: Overall cognitive status: Within functional limits for tasks assessed     SENSATION: WFL  EDEMA:  Noticed at night, none noted  today  MUSCLE LENGTH: Hamstrings: Right 30 deg; Left 30 deg Quadriceps: Right 120 deg; Left 120 deg Gastrocnemius: Right 6 deg; Left 6 deg  POSTURE: No Significant postural limitations  PALPATION: No significant tenderness noted of the inferior patellar pole, medial or lateral joint line  LOWER EXTREMITY ROM:  Active ROM Left/Right 08/15/2023   Hip flexion    Hip extension    Hip abduction    Hip adduction    Hip internal rotation    Hip external rotation    Knee flexion 151/155   Knee extension 0/0   Ankle dorsiflexion    Ankle plantarflexion    Ankle inversion    Ankle eversion     (Blank rows = not tested)  LOWER EXTREMITY MMT: Thigh girth was assessed 15 cm proximal to the superior patellar pole  MMT Left/Right in cm    Hip flexion    Hip extension    Hip abduction    Hip adduction    Hip internal rotation    Hip external rotation    Knee flexion 16.0/24.7 pounds   Knee extension (Goal 95) 49.5/50.0 cm & 43.8/64.3 pounds   Ankle dorsiflexion    Ankle plantarflexion    Ankle inversion    Ankle eversion     (Blank rows = not tested)  GAIT: Distance walked: 100 feet Assistive device utilized: None Level of assistance: Complete Independence Comments: Lisa Waters notes increasing knee pain throughout the day, particularly with standing and walking.  No significant deviations noted while walking in the clinic.                                                                                                                                TREATMENT DATE:  08/22/2023 Supine hamstrings stretch with ankle dorsiflexion 4 x 20 seconds Seated straight leg raises 3 sets of 5 with 2 pound weights  Seated knee extension machine 2 sets of 15 with 15# up bilateral down with left only, slow eccentrics  Functional Activities (sit to stand and stairs): Single leg press (up bilateral, down left  only) 2 sets of 15 with slow eccentrics 25# Step-down 15 x slow eccentrics off 2 inch  step  Neuromuscular re-education: Tandem balance with slight knee bend eyes open; head turning   08/15/2023 Supine hamstrings stretch with ankle dorsiflexion 2 x 20 seconds Seated straight leg raises 3 sets of 5 with 2-1/2 pound weights (1 pound next visit)  Functional Activities: Reviewed exam findings, discussed normal quadriceps and hamstrings strength values for 43 year old female would be approximately 94 pounds for quadriceps and 63 pounds for hamstrings.  Discussed the importance of avoiding overuse including recommendations to avoid lower extremity cardiovascular exercise on a daily basis and reducing that to 3 times a week to help avoid overuse while focusing on thigh strengthening, particularly quadriceps.   PATIENT EDUCATION:  Education details: See above Person educated: Patient Education method: Programmer, Multimedia, Demonstration, Verbal cues, and Handouts Education comprehension: verbalized understanding, returned demonstration, verbal cues required, and needs further education  HOME EXERCISE PROGRAM: Access Code: [email or web address] URL: [email or web address] Date: 08/15/2023 Prepared by: Lamar Ivory  Exercises - Supine Hamstring Stretch  - 2-3 x daily - 7 x weekly - 1 sets - 4-5 reps - 10-15 seconds hold - Small Range Straight Leg Raise  - 2 x daily - 7 x weekly - 5 sets - 5 reps - 3 seconds hold  ASSESSMENT:  CLINICAL IMPRESSION: Lisa Waters reports and demonstrates good compliance and understanding with her day 1 exercises.  We added some activities she can do at her home gym to improve quadriceps strength, avoid overuse and improve function.  Continue current emphasis on quadriceps strength to meet long-term goals.  Patient is a 43 y.o. female who was seen today for physical therapy evaluation and treatment for  Diagnosis  M25.562,G89.29 (ICD-10-CM) - Chronic pain of left knee  .  Lisa Waters's symptoms have been getting worse over the previous 6 months and she is continue to  exercise on a daily basis including lower extremity cardio.  Avoiding overuse while focusing on proximal leg strength (particularly quadriceps) and working on flexibility impairments should allow Lisa Waters to meet the below listed goals.  Lisa Waters may be a slightly longer rehabilitation secondary to the symptoms being of 6+ months duration.  OBJECTIVE IMPAIRMENTS: decreased activity tolerance, decreased endurance, decreased knowledge of condition, difficulty walking, decreased strength, decreased safety awareness, impaired perceived functional ability, impaired flexibility, and pain.   ACTIVITY LIMITATIONS: standing, squatting, stairs, and locomotion level  PARTICIPATION LIMITATIONS: community activity, occupation, and workouts  PERSONAL FACTORS: ACDF, anxiety, asthma, depression, h/o amaurosis fugas, Hodgkin's Lymphoma, hypothoidism, hyperlipidemia, HTN, MVP, bilateral foot surgery, Raynauds disease are also affecting patient's functional outcome.   REHAB POTENTIAL: Good  CLINICAL DECISION MAKING: Stable/uncomplicated  EVALUATION COMPLEXITY: Low   GOALS: Goals reviewed with patient? Yes  SHORT TERM GOALS: Target date: 09/12/2023 Lisa Waters will be independent with her day 1 home exercise program Baseline: Started 08/15/2023 Goal status: Met 08/22/2023  2.  Improve left quadriceps strength to at least 60 pounds Baseline: 43.8 pounds Goal status: INITIAL  3.  Improve bilateral hamstrings flexibility to at least 40 degrees and ankle dorsiflexion active range of motion to at least 10 degrees Baseline: 30 degrees and 6 degrees respectively Goal status: INITIAL  LONG TERM GOALS: Target date: 11/07/2023  Improve FOTO to 77 in 11 visits Baseline: 63 Goal status: INITIAL  2.  Improve left knee pain to consistently 0-2/10 on the VAS Baseline: 0-5/10 Goal status: INITIAL  3.  Improve bilateral quadriceps strength to at least 80 pounds Baseline: See objective Goal  status: INITIAL  4.  Improve  bilateral hamstrings strength to at least 55 pounds Baseline: See objective Goal status: INITIAL  5.  Lisa Waters will be able to return to lower extremity cardiovascular work at least 5 days a week without increasing her symptoms above 2/10 on the VAS Baseline: Unable at evaluation Goal status: INITIAL  6.  Lisa Waters will be independent with her long-term home maintenance exercise program at discharge Baseline: Started 08/15/2023 Goal status: INITIAL   PLAN:  PT FREQUENCY: 1x/week  PT DURATION: 12 weeks  PLANNED INTERVENTIONS: 97110-Therapeutic exercises, 97530- Therapeutic activity, 97112- Neuromuscular re-education, 97535- Self Care, [id number]- Manual therapy, 97016- Vasopneumatic device, Patient/Family education, Balance training, Stair training, and Cryotherapy  PLAN FOR NEXT SESSION: Emphasis on thigh strength, particularly quadriceps, while avoiding overuse.   Myer LELON Ivory, PT, MPT 08/22/2023, 10:25 AM

## 2023-08-30 ENCOUNTER — Encounter: Payer: BC Managed Care – PPO | Admitting: Rehabilitative and Restorative Service Providers"

## 2023-09-11 ENCOUNTER — Encounter: Payer: Self-pay | Admitting: Internal Medicine

## 2023-09-11 ENCOUNTER — Ambulatory Visit: Payer: BC Managed Care – PPO | Admitting: Orthopaedic Surgery

## 2023-09-11 VITALS — BP 151/98 | HR 128

## 2023-09-11 DIAGNOSIS — M2242 Chondromalacia patellae, left knee: Secondary | ICD-10-CM | POA: Diagnosis not present

## 2023-09-11 NOTE — Progress Notes (Signed)
 Office Visit Note   Patient: Lisa Waters           Date of Birth: 08/23/1980           MRN: [id number] Visit Date: 09/11/2023              Requested by: Ronnald Nian, MD [address] [place],  [place] [address] PCP: Ronnald Nian, MD   Assessment & Plan: Visit Diagnoses:  1. Chondromalacia patellae, left knee     Plan: Patient continue to work on quad strengthening as instructed by therapy with a home program.  If she has ongoing problems in a few weeks she could return for single intra-articular injection.  We went over knee model and discussed knee anatomy and discussed activities that will not load the patellofemoral joint is much with terminal arc extensions and isometrics as well as straight leg raising against resistance.  Follow-Up Instructions: No follow-ups on file.   Orders:  No orders of the defined types were placed in this encounter.  No orders of the defined types were placed in this encounter.     Procedures: No procedures performed   Clinical Data: No additional findings.   Subjective: Chief Complaint  Patient presents with   Left Knee - Pain, Follow-up    HPI 44 year old female returns with ongoing problems with left knee pain.  Problems with squats and stairs and patellofemoral loading.  No locking.  Negative x-rays.  Past history of Hodgkin's lymphoma in remission previous mastectomy with reconstruction and single level cervical fusion we did which is doing well.  She has had a few therapy visits is still doing exercises only going once a week to therapy since she is doing the activities at home with equipment as instructed by PT.  Review of Systems all systems updated unchanged from 08/07/2023 office visit.   Objective: Vital Signs: BP (!) 151/98   Pulse (!) 128   Physical Exam Constitutional:      Appearance: She is well-developed.  HENT:     Head: Normocephalic.     Right Ear: External ear normal.     Left Ear: External ear  normal. There is no impacted cerumen.  Eyes:     Pupils: Pupils are equal, round, and reactive to light.  Neck:     Thyroid: No thyromegaly.     Trachea: No tracheal deviation.  Cardiovascular:     Rate and Rhythm: Normal rate.  Pulmonary:     Effort: Pulmonary effort is normal.  Abdominal:     Palpations: Abdomen is soft.  Musculoskeletal:     Cervical back: No rigidity.  Skin:    General: Skin is warm and dry.  Neurological:     Mental Status: She is alert and oriented to person, place, and time.  Psychiatric:        Behavior: Behavior normal.     Ortho Exam mild crepitus knee extension no pain with hyperextension collateral ligaments are stable negative anterior drawer.  No lateral patellar subluxation.  Specialty Comments:  No specialty comments available.  Imaging: No results found.   PMFS History: Patient Active Problem List   Diagnosis Date Noted   Chondromalacia patellae, left knee 09/11/2023   History of mastectomy, bilateral 11/06/2022   History of fusion of cervical spine 06/28/2022   Bilateral hand numbness 06/14/2022   History of cervical discectomy 11/01/2021   Cervical spinal stenosis 09/09/2021   Hyperlipidemia LDL goal <100 08/31/2020   Essential hypertension 08/30/2020   Vitamin  D deficiency 11/12/2018   Vaccine counseling 07/26/2017   Routine general medical examination at a health care facility 05/30/2016   Other specified hypothyroidism 04/01/2015   Major depression, recurrent (HCC) 05/25/2013   Allergic rhinitis 01/20/2013   Female infertility of other specified origin 04/09/2012   Raynauds disease 06/29/2011   Hodgkin's disease in remission (HCC) 12/29/2010   MVP (mitral valve prolapse) 12/29/2010   Past Medical History:  Diagnosis Date   Anxiety    Asthma    mild intermittent as of 07/2017 - just when patient gets sick   Depression    Female infertility    GERD (gastroesophageal reflux disease)    Hx - no current problems, no meds    H/O amaurosis fugax Fall 2012   Resolved, no current problems, Hx-MRI of brain/brain stem: no acute abnormality, small area of encephalomalacia left superior cerebellum that could be prior trauma, infection, or lacunar infarct   History of Hodgkin's lymphoma 2007   in remission, sees [facility], prior with Dr. Francee Gentile; prior chemotherapy and radiation therapy; sees yearly in December, no current problems   Hyperlipidemia 07/2017   diet control, no meds   Hypothyroidism    Moderate mitral regurgitation by prior echocardiogram 09/2010   10/2015: EF 55-60%. Normal WM. Normal D Fxn. Bilateral MVP w/ Mod MR (very eccentric). Normal LA, RV & RA. Normal PAP.   MVP (mitral valve prolapse) 09/2010   Dr. Herbie [place], [facility]; a) Echo 3/'12: Mod MR; EF 60-65%; b) TEE 5/'12: Nl LV Fxn, EF 60-65%, mild, holosystolic prolapse of the medial anterior leaflet. Mod MR . c) Echo 3/'15: Moderate, holosystolic prolapse of  medial. Mod MR d) TM Stress Echo (@ [facility]) Nl Lv Fxn, Mild-Mod MR @ Res0 --> Mod MR at HR 181 bpm (7:22 min, 10.10 METS) w/p WMA   Oral ulcer    Palpitations 10/2012   holter monitor, Dr. Herbie [place]; no arrhythmia, no current problems   PONV (postoperative nausea and vomiting)     Family History  Problem Relation Age of Onset   Skin cancer Father    Cancer Paternal Grandmother        metastasis   Alcohol abuse Mother    Emphysema Paternal Grandfather        was a smoker, heart problems   Diabetes Maternal Aunt    OCD Maternal Aunt    Bipolar disorder Maternal Aunt    Stroke Maternal Grandmother    Cancer Maternal Aunt        skin   Heart disease Neg Hx    Hypertension Neg Hx    Hyperlipidemia Neg Hx     Past Surgical History:  Procedure Laterality Date   ANTERIOR CERVICAL DECOMP/DISCECTOMY FUSION N/A 09/09/2021   Procedure: C6-7 ANTERIOR CERVICAL DISCECTOMY FUSION, ALLOGRAFT, PLATE;  Surgeon: Eldred Manges, MD;  Location: [facility] OR;  Service: Orthopedics;  Laterality: N/A;   APPENDECTOMY   1993   BREAST ENHANCEMENT SURGERY  2002   COLONOSCOPY  07/2014   Dr. Loreta Ave - normal   DILATATION & CURETTAGE/HYSTEROSCOPY WITH MYOSURE N/A 01/29/2018   Procedure: DILATATION & CURETTAGE/HYSTEROSCOPY WITH MYOSURE POLYPECTOMY;  Surgeon: Vick Frees, MD;  Location: [facility];  Service: Gynecology;  Laterality: N/A;   ESOPHAGOGASTRODUODENOSCOPY  04/11/2013   [facility] Dr. Loreta Ave   FOOT SURGERY Bilateral    on toes   INSERTION CENTRAL VENOUS ACCESS DEVICE W/ SUBCUTANEOUS PORT  2006   LYMPH NODE BIOPSY  2006   under right arm   MASTECTOMY Bilateral  02/2020   double mastectomy. Has had 9 surgical procedures on breast since then.   NM MYOCAR PERF WALL MOTION  12/2010   persantine myoview - moderate breast attenuation (fixed mid-distal anterior defect), EF 68%, low risk scan   RHINOPLASTY  2007   deviated septum   TEE WITHOUT CARDIOVERSION  11/2010   Nl LV Fxn, EF 60-65%, mild, holosystolic prolapse of the medial anterior leaflet. Mod MR .    TRANSTHORACIC ECHOCARDIOGRAM  3/'14; 3/'15   LVEF 60-65%, wall motion normal, LV function normal, moderate holosystolic MVP (medial the middle scallop of the posterior leaflet), moderate regurgitation (directed posteriorly), no shunt -- stable over 2 years   TRANSTHORACIC ECHOCARDIOGRAM  11/03/2021   EF 60 to 65%.  Normal LV size and function.  Normal diastolic parameters.  Normal RV size and function.  Normal aortic valve.  Normal RAP.  Myxomatous mitral valve with anterior leaflet prolapse, moderate MR-posteriorly directed eccentric MR jet:   TRANSTHORACIC ECHOCARDIOGRAM  10/22/2019   EF 55 to 60%.  Mild LVH.  GRII DD.  Elevated LAP.  Mild LA dilation.  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  Normal aortic valve.  (Stable   WISDOM TOOTH EXTRACTION     Social History   Occupational History   Occupation: works in Hydrologist: [employer]  Tobacco Use   Smoking status: Former    Current packs/day: 0.00    Average packs/day: 1  pack/day for 10.0 years (10.0 ttl pk-yrs)    Types: Cigarettes    Start date: 11/08/1999    Quit date: 11/07/2009    Years since quitting: 13.8   Smokeless tobacco: Never  Vaping Use   Vaping status: Never Used  Substance and Sexual Activity   Alcohol use: Yes    Alcohol/week: 1.0 - 2.0 standard drink of alcohol    Types: 1 - 2 Cans of beer per week    Comment: social Beer/Liquor   Drug use: No   Sexual activity: Yes    Partners: Male    Birth control/protection: None

## 2023-09-13 ENCOUNTER — Encounter: Payer: BC Managed Care – PPO | Admitting: Rehabilitative and Restorative Service Providers"

## 2023-10-05 ENCOUNTER — Other Ambulatory Visit: Payer: Self-pay | Admitting: Family Medicine

## 2023-10-05 DIAGNOSIS — F411 Generalized anxiety disorder: Secondary | ICD-10-CM

## 2023-10-05 NOTE — Telephone Encounter (Signed)
 Is this okay to refill?

## 2023-11-13 ENCOUNTER — Encounter: Payer: BC Managed Care – PPO | Admitting: Family Medicine

## 2023-11-13 DIAGNOSIS — I341 Nonrheumatic mitral (valve) prolapse: Secondary | ICD-10-CM

## 2023-11-13 DIAGNOSIS — Z0000 Encounter for general adult medical examination without abnormal findings: Secondary | ICD-10-CM

## 2023-11-13 DIAGNOSIS — I10 Essential (primary) hypertension: Secondary | ICD-10-CM

## 2023-11-13 DIAGNOSIS — M2242 Chondromalacia patellae, left knee: Secondary | ICD-10-CM

## 2023-11-13 DIAGNOSIS — F330 Major depressive disorder, recurrent, mild: Secondary | ICD-10-CM

## 2023-11-13 DIAGNOSIS — C817A Other Hodgkin lymphoma, in remission: Secondary | ICD-10-CM

## 2023-11-13 DIAGNOSIS — I7300 Raynaud's syndrome without gangrene: Secondary | ICD-10-CM

## 2023-11-13 DIAGNOSIS — E559 Vitamin D deficiency, unspecified: Secondary | ICD-10-CM

## 2023-11-13 DIAGNOSIS — E785 Hyperlipidemia, unspecified: Secondary | ICD-10-CM

## 2023-11-13 DIAGNOSIS — J301 Allergic rhinitis due to pollen: Secondary | ICD-10-CM

## 2023-11-13 DIAGNOSIS — E038 Other specified hypothyroidism: Secondary | ICD-10-CM

## 2023-11-15 ENCOUNTER — Encounter: Payer: BC Managed Care – PPO | Admitting: Family Medicine

## 2023-11-16 ENCOUNTER — Other Ambulatory Visit: Payer: Self-pay | Admitting: Family Medicine

## 2023-11-16 DIAGNOSIS — E038 Other specified hypothyroidism: Secondary | ICD-10-CM

## 2023-12-25 DIAGNOSIS — C819A Hodgkin lymphoma, unspecified, in remission: Secondary | ICD-10-CM | POA: Diagnosis not present

## 2023-12-25 DIAGNOSIS — C8190 Hodgkin lymphoma, unspecified, unspecified site: Secondary | ICD-10-CM | POA: Diagnosis not present

## 2024-01-04 ENCOUNTER — Other Ambulatory Visit: Payer: Self-pay | Admitting: Family Medicine

## 2024-01-04 DIAGNOSIS — F411 Generalized anxiety disorder: Secondary | ICD-10-CM

## 2024-01-07 ENCOUNTER — Telehealth: Admitting: Family Medicine

## 2024-01-07 DIAGNOSIS — L2381 Allergic contact dermatitis due to animal (cat) (dog) dander: Secondary | ICD-10-CM

## 2024-01-07 MED ORDER — TRIAMCINOLONE ACETONIDE 0.1 % EX CREA
1.0000 | TOPICAL_CREAM | Freq: Two times a day (BID) | CUTANEOUS | 0 refills | Status: AC
Start: 2024-01-07 — End: ?

## 2024-01-07 NOTE — Progress Notes (Signed)
 E Visit for Rash  We are sorry that you are not feeling well. Here is how we plan to help!  Based on what you shared with me it looks like you have contact dermatitis.  Contact dermatitis is a skin rash caused by something that touches the skin and causes irritation or inflammation.  Your skin may be red, swollen, dry, cracked, and itch.  The rash should go away in a few days but can last a few weeks.  If you get a rash, it's important to figure out what caused it so the irritant can be avoided in the future. and I am prescribing triamcinolone  0.1 % cream -- apply to the affected area(s) in a thin layer, twice daily for up to 14 days. Do not apply to face, privates or armpit regions.    HOME CARE:  Take cool showers and avoid direct sunlight. Apply cool compress or wet dressings. Take a bath in an oatmeal bath.  Sprinkle content of one Aveeno packet under running faucet with comfortably warm water.  Bathe for 15-20 minutes, 1-2 times daily.  Pat dry with a towel. Do not rub the rash. Use hydrocortisone cream. Take an antihistamine like Benadryl  for widespread rashes that itch.  The adult dose of Benadryl  is 25-50 mg by mouth 4 times daily. Caution:  This type of medication may cause sleepiness.  Do not drink alcohol, drive, or operate dangerous machinery while taking antihistamines.  Do not take these medications if you have prostate enlargement.  Read package instructions thoroughly on all medications that you take.  GET HELP RIGHT AWAY IF:  Symptoms don't go away after treatment. Severe itching that persists. If you rash spreads or swells. If you rash begins to smell. If it blisters and opens or develops a yellow-brown crust. You develop a fever. You have a sore throat. You become short of breath.  MAKE SURE YOU:  Understand these instructions. Will watch your condition. Will get help right away if you are not doing well or get worse.  Thank you for choosing an e-visit.  Your  e-visit answers were reviewed by a board certified advanced clinical practitioner to complete your personal care plan. Depending upon the condition, your plan could have included both over the counter or prescription medications.  Please review your pharmacy choice. Make sure the pharmacy is open so you can pick up prescription now. If there is a problem, you may contact your provider through [employer] and have the prescription routed to another pharmacy.  Your safety is important to us . If you have drug allergies check your prescription carefully.   For the next 24 hours you can use MyChart to ask questions about today's visit, request a non-urgent call back, or ask for a work or school excuse. You will get an email in the next two days asking about your experience. I hope that your e-visit has been valuable and will speed your recovery.  I provided 5 minutes of non face-to-face time during this encounter for chart review, medication and order placement, as well as and documentation.

## 2024-01-24 ENCOUNTER — Telehealth: Admitting: Emergency Medicine

## 2024-01-24 DIAGNOSIS — R051 Acute cough: Secondary | ICD-10-CM

## 2024-01-24 MED ORDER — BENZONATATE 100 MG PO CAPS: 100.0000 mg | ORAL_CAPSULE | Freq: Two times a day (BID) | ORAL | 0 refills | Status: AC | PRN

## 2024-01-24 NOTE — Progress Notes (Signed)
E-Visit for Cough  We are sorry that you are not feeling well.  Here is how we plan to help!  Based on your presentation I believe you most likely have A cough due to a virus.  This is called viral bronchitis and is best treated by rest, plenty of fluids and control of the cough.  You may use Ibuprofen or Tylenol as directed to help your symptoms.     In addition you may use A prescription cough medication called Tessalon Perles 100mg. You may take 1-2 capsules every 8 hours as needed for your cough.    From your responses in the eVisit questionnaire you describe inflammation in the upper respiratory tract which is causing a significant cough.  This is commonly called Bronchitis and has four common causes:   Allergies Viral Infections Acid Reflux Bacterial Infection Allergies, viruses and acid reflux are treated by controlling symptoms or eliminating the cause. An example might be a cough caused by taking certain blood pressure medications. You stop the cough by changing the medication. Another example might be a cough caused by acid reflux. Controlling the reflux helps control the cough.      HOME CARE Only take medications as instructed by your medical team. Complete the entire course of an antibiotic. Drink plenty of fluids and get plenty of rest. Avoid close contacts especially the very young and the elderly Cover your mouth if you cough or cough into your sleeve. Always remember to wash your hands A steam or ultrasonic humidifier can help congestion.   GET HELP RIGHT AWAY IF: You develop worsening fever. You become short of breath You cough up blood. Your symptoms persist after you have completed your treatment plan MAKE SURE YOU  Understand these instructions. Will watch your condition. Will get help right away if you are not doing well or get worse.    Thank you for choosing an e-visit.  Your e-visit answers were reviewed by a board certified advanced clinical  practitioner to complete your personal care plan. Depending upon the condition, your plan could have included both over the counter or prescription medications.  Please review your pharmacy choice. Make sure the pharmacy is open so you can pick up prescription now. If there is a problem, you may contact your provider through MyChart messaging and have the prescription routed to another pharmacy.  Your safety is important to us. If you have drug allergies check your prescription carefully.   For the next 24 hours you can use MyChart to ask questions about today's visit, request a non-urgent call back, or ask for a work or school excuse. You will get an email in the next two days asking about your experience. I hope that your e-visit has been valuable and will speed your recovery.  Approximately 5 minutes was used in reviewing the patient's chart, questionnaire, prescribing medications, and documentation.  

## 2024-03-06 DIAGNOSIS — E860 Dehydration: Secondary | ICD-10-CM | POA: Diagnosis not present

## 2024-03-06 DIAGNOSIS — R258 Other abnormal involuntary movements: Secondary | ICD-10-CM | POA: Diagnosis not present

## 2024-03-06 DIAGNOSIS — R0789 Other chest pain: Secondary | ICD-10-CM | POA: Diagnosis not present

## 2024-03-06 DIAGNOSIS — F32A Depression, unspecified: Secondary | ICD-10-CM | POA: Diagnosis not present

## 2024-03-06 DIAGNOSIS — Z8571 Personal history of Hodgkin lymphoma: Secondary | ICD-10-CM | POA: Diagnosis not present

## 2024-03-06 DIAGNOSIS — R55 Syncope and collapse: Secondary | ICD-10-CM | POA: Diagnosis not present

## 2024-03-06 DIAGNOSIS — E876 Hypokalemia: Secondary | ICD-10-CM | POA: Diagnosis not present

## 2024-03-06 DIAGNOSIS — R0602 Shortness of breath: Secondary | ICD-10-CM | POA: Diagnosis not present

## 2024-03-06 DIAGNOSIS — I10 Essential (primary) hypertension: Secondary | ICD-10-CM | POA: Diagnosis not present

## 2024-03-06 DIAGNOSIS — Z9221 Personal history of antineoplastic chemotherapy: Secondary | ICD-10-CM | POA: Diagnosis not present

## 2024-03-06 DIAGNOSIS — I341 Nonrheumatic mitral (valve) prolapse: Secondary | ICD-10-CM | POA: Diagnosis not present

## 2024-03-06 DIAGNOSIS — F1010 Alcohol abuse, uncomplicated: Secondary | ICD-10-CM | POA: Diagnosis not present

## 2024-03-06 DIAGNOSIS — R791 Abnormal coagulation profile: Secondary | ICD-10-CM | POA: Diagnosis not present

## 2024-03-06 DIAGNOSIS — Z91041 Radiographic dye allergy status: Secondary | ICD-10-CM | POA: Diagnosis not present

## 2024-03-06 DIAGNOSIS — E871 Hypo-osmolality and hyponatremia: Secondary | ICD-10-CM | POA: Diagnosis not present

## 2024-03-06 DIAGNOSIS — Z9013 Acquired absence of bilateral breasts and nipples: Secondary | ICD-10-CM | POA: Diagnosis not present

## 2024-03-06 DIAGNOSIS — R61 Generalized hyperhidrosis: Secondary | ICD-10-CM | POA: Diagnosis not present

## 2024-03-06 DIAGNOSIS — F10939 Alcohol use, unspecified with withdrawal, unspecified: Secondary | ICD-10-CM | POA: Diagnosis not present

## 2024-03-06 DIAGNOSIS — F410 Panic disorder [episodic paroxysmal anxiety] without agoraphobia: Secondary | ICD-10-CM | POA: Diagnosis not present

## 2024-03-06 DIAGNOSIS — Z85118 Personal history of other malignant neoplasm of bronchus and lung: Secondary | ICD-10-CM | POA: Diagnosis not present

## 2024-03-06 DIAGNOSIS — E039 Hypothyroidism, unspecified: Secondary | ICD-10-CM | POA: Diagnosis not present

## 2024-03-06 DIAGNOSIS — R0603 Acute respiratory distress: Secondary | ICD-10-CM | POA: Diagnosis not present

## 2024-03-06 DIAGNOSIS — Z923 Personal history of irradiation: Secondary | ICD-10-CM | POA: Diagnosis not present

## 2024-03-06 DIAGNOSIS — J449 Chronic obstructive pulmonary disease, unspecified: Secondary | ICD-10-CM | POA: Diagnosis not present

## 2024-03-06 DIAGNOSIS — E872 Acidosis, unspecified: Secondary | ICD-10-CM | POA: Diagnosis not present

## 2024-03-06 DIAGNOSIS — R079 Chest pain, unspecified: Secondary | ICD-10-CM | POA: Diagnosis not present

## 2024-03-06 DIAGNOSIS — Z79899 Other long term (current) drug therapy: Secondary | ICD-10-CM | POA: Diagnosis not present

## 2024-03-06 DIAGNOSIS — F10239 Alcohol dependence with withdrawal, unspecified: Secondary | ICD-10-CM | POA: Diagnosis not present

## 2024-03-06 DIAGNOSIS — Z79890 Hormone replacement therapy (postmenopausal): Secondary | ICD-10-CM | POA: Diagnosis not present

## 2024-03-06 DIAGNOSIS — I340 Nonrheumatic mitral (valve) insufficiency: Secondary | ICD-10-CM | POA: Diagnosis not present

## 2024-03-06 DIAGNOSIS — Y906 Blood alcohol level of 120-199 mg/100 ml: Secondary | ICD-10-CM | POA: Diagnosis not present

## 2024-03-06 DIAGNOSIS — R000 Tachycardia, unspecified: Secondary | ICD-10-CM | POA: Diagnosis not present

## 2024-03-06 DIAGNOSIS — R232 Flushing: Secondary | ICD-10-CM | POA: Diagnosis not present

## 2024-03-06 DIAGNOSIS — E785 Hyperlipidemia, unspecified: Secondary | ICD-10-CM | POA: Diagnosis not present

## 2024-03-24 ENCOUNTER — Encounter: Payer: Self-pay | Admitting: Family Medicine

## 2024-05-19 ENCOUNTER — Encounter: Payer: Self-pay | Admitting: Radiology

## 2024-05-30 DIAGNOSIS — M25512 Pain in left shoulder: Secondary | ICD-10-CM | POA: Diagnosis not present

## 2024-07-02 ENCOUNTER — Other Ambulatory Visit: Payer: Self-pay | Admitting: Family Medicine

## 2024-07-02 DIAGNOSIS — F411 Generalized anxiety disorder: Secondary | ICD-10-CM

## 2024-07-11 ENCOUNTER — Telehealth: Payer: Self-pay

## 2024-07-11 NOTE — Progress Notes (Signed)
" ° °  07/11/2024  Patient ID: Lisa Waters, female   DOB: 1981-05-04, 44 y.o.   MRN: [id number]  Pharmacy Quality Measure Review  This patient is appearing on a report for being at risk of failing the Controlling Blood Pressure measure this calendar year.   Last documented BP 151/98 on 09/11/23   Spoke with patient. She believes BP has come down now with lifestyle modifications but no specific readings to report. Discussed that she is past due for her annual physical, would like to schedule. Will coordinate with front office to outreach patient.  Jon VEAR Lindau, PharmD Clinical Pharmacist [phone]   "

## 2024-07-18 ENCOUNTER — Telehealth: Payer: Self-pay | Admitting: Family Medicine

## 2024-07-18 NOTE — Telephone Encounter (Signed)
Left message to schedule CPE 

## 2024-07-28 DIAGNOSIS — F1010 Alcohol abuse, uncomplicated: Secondary | ICD-10-CM | POA: Insufficient documentation

## 2024-07-31 ENCOUNTER — Encounter: Payer: Self-pay | Admitting: Family Medicine

## 2024-07-31 ENCOUNTER — Ambulatory Visit (INDEPENDENT_AMBULATORY_CARE_PROVIDER_SITE_OTHER): Admitting: Family Medicine

## 2024-07-31 VITALS — BP 108/78 | HR 83 | Ht 67.0 in | Wt 143.6 lb

## 2024-07-31 DIAGNOSIS — Z0000 Encounter for general adult medical examination without abnormal findings: Secondary | ICD-10-CM | POA: Diagnosis not present

## 2024-07-31 DIAGNOSIS — E038 Other specified hypothyroidism: Secondary | ICD-10-CM

## 2024-07-31 DIAGNOSIS — Z9889 Other specified postprocedural states: Secondary | ICD-10-CM

## 2024-07-31 DIAGNOSIS — I341 Nonrheumatic mitral (valve) prolapse: Secondary | ICD-10-CM

## 2024-07-31 DIAGNOSIS — F325 Major depressive disorder, single episode, in full remission: Secondary | ICD-10-CM

## 2024-07-31 DIAGNOSIS — Z23 Encounter for immunization: Secondary | ICD-10-CM

## 2024-07-31 DIAGNOSIS — E785 Hyperlipidemia, unspecified: Secondary | ICD-10-CM | POA: Diagnosis not present

## 2024-07-31 DIAGNOSIS — M4802 Spinal stenosis, cervical region: Secondary | ICD-10-CM | POA: Diagnosis not present

## 2024-07-31 DIAGNOSIS — J301 Allergic rhinitis due to pollen: Secondary | ICD-10-CM | POA: Diagnosis not present

## 2024-07-31 DIAGNOSIS — F330 Major depressive disorder, recurrent, mild: Secondary | ICD-10-CM

## 2024-07-31 DIAGNOSIS — Z9013 Acquired absence of bilateral breasts and nipples: Secondary | ICD-10-CM

## 2024-07-31 DIAGNOSIS — F1010 Alcohol abuse, uncomplicated: Secondary | ICD-10-CM

## 2024-07-31 DIAGNOSIS — Z981 Arthrodesis status: Secondary | ICD-10-CM

## 2024-07-31 DIAGNOSIS — Z7185 Encounter for immunization safety counseling: Secondary | ICD-10-CM

## 2024-07-31 DIAGNOSIS — C817A Other Hodgkin lymphoma, in remission: Secondary | ICD-10-CM

## 2024-07-31 DIAGNOSIS — I10 Essential (primary) hypertension: Secondary | ICD-10-CM

## 2024-07-31 DIAGNOSIS — I7300 Raynaud's syndrome without gangrene: Secondary | ICD-10-CM | POA: Diagnosis not present

## 2024-07-31 LAB — CBC WITH DIFFERENTIAL/PLATELET
Basophils Absolute: 0.1 x10E3/uL (ref 0.0–0.2)
Basos: 1 %
EOS (ABSOLUTE): 0.2 x10E3/uL (ref 0.0–0.4)
Eos: 5 %
Hematocrit: 38 % (ref 34.0–46.6)
Hemoglobin: 12.6 g/dL (ref 11.1–15.9)
Immature Grans (Abs): 0 x10E3/uL (ref 0.0–0.1)
Immature Granulocytes: 0 %
Lymphocytes Absolute: 1.4 x10E3/uL (ref 0.7–3.1)
Lymphs: 33 %
MCH: 30.7 pg (ref 26.6–33.0)
MCHC: 33.2 g/dL (ref 31.5–35.7)
MCV: 93 fL (ref 79–97)
Monocytes Absolute: 0.5 x10E3/uL (ref 0.1–0.9)
Monocytes: 11 %
Neutrophils Absolute: 2.2 x10E3/uL (ref 1.4–7.0)
Neutrophils: 50 %
Platelets: 273 x10E3/uL (ref 150–450)
RBC: 4.1 x10E6/uL (ref 3.77–5.28)
RDW: 12.6 % (ref 11.7–15.4)
WBC: 4.3 x10E3/uL (ref 3.4–10.8)

## 2024-07-31 LAB — COMPREHENSIVE METABOLIC PANEL WITH GFR
ALT: 10 IU/L (ref 0–32)
AST: 15 IU/L (ref 0–40)
Albumin: 4.5 g/dL (ref 3.9–4.9)
Alkaline Phosphatase: 47 IU/L (ref 41–116)
BUN/Creatinine Ratio: 12 (ref 9–23)
BUN: 9 mg/dL (ref 6–24)
Bilirubin Total: 0.5 mg/dL (ref 0.0–1.2)
CO2: 23 mmol/L (ref 20–29)
Calcium: 9.4 mg/dL (ref 8.7–10.2)
Chloride: 102 mmol/L (ref 96–106)
Creatinine, Ser: 0.75 mg/dL (ref 0.57–1.00)
Globulin, Total: 2.1 g/dL (ref 1.5–4.5)
Glucose: 90 mg/dL (ref 70–99)
Potassium: 4 mmol/L (ref 3.5–5.2)
Sodium: 137 mmol/L (ref 134–144)
Total Protein: 6.6 g/dL (ref 6.0–8.5)
eGFR: 101 mL/min/1.73

## 2024-07-31 LAB — LIPID PANEL
Chol/HDL Ratio: 3.3 ratio (ref 0.0–4.4)
Cholesterol, Total: 236 mg/dL — ABNORMAL HIGH (ref 100–199)
HDL: 71 mg/dL
LDL Chol Calc (NIH): 156 mg/dL — ABNORMAL HIGH (ref 0–99)
Triglycerides: 57 mg/dL (ref 0–149)
VLDL Cholesterol Cal: 9 mg/dL (ref 5–40)

## 2024-07-31 LAB — TSH: TSH: 2.04 u[IU]/mL (ref 0.450–4.500)

## 2024-07-31 NOTE — Progress Notes (Signed)
 "  Complete physical exam  Patient: Lisa Waters   DOB: [birth year]   44 y.o. Female  MRN: [id number]  Subjective:    Chief Complaint  Patient presents with   Annual Exam    Fasting-pain in lower back-doesn't want Tdap    Lisa Waters is a 44 y.o. female who presents today for a complete physical exam.  She reports consuming a low cal diet. Plays golf She generally feels well. She reports sleeping well. Discussed the use of AI scribe software for clinical note transcription with the patient, who gave verbal consent to proceed.  She experiences intermittent sharp pain that travels down the center of her right buttock, occasionally causing her to fall to her knees. The pain is quick and transient, typically resolving within a minute, although on one occasion it lingered for about five minutes. It occurs sporadically, sometimes a few times in a day and then not for several days.  In August, she experienced palpitations and shortness of breath after a day of heavy alcohol consumption while traveling. She was hospitalized for three days due to severe dehydration, low magnesium , and vitamin D  levels. Since then, she has abstained from alcohol.  She has a history of Hodgkin's lymphoma, in remission since 2007, and underwent a double mastectomy due to irregular mammograms and potential radiation effects.  She has a history of mitral valve prolapse, which previously caused palpitations but no longer bothers her. She also experiences Raynaud's disease, for which she uses heated gloves and socks to manage symptoms. She has had previous surgery on her neck but is having no difficulty with that.   She has a history of major depression, currently in remission, attributing past depressive episodes to personal circumstances, such as not having children. She does have underlying allergies but presently is on no medication. She takes Synthroid  88 micrograms daily for thyroid  management. She previously took  amlodipine  for hypertension, prescribed during a hospital stay, but has since discontinued it as her blood pressure normalized with lifestyle changes.  She reports a significant weight loss of 20 pounds and an improvement in overall health, attributing this to regular exercise and healthy eating.      Most recent fall risk assessment:    07/31/2024    8:17 AM  Fall Risk   Falls in the past year? 0  Number falls in past yr: 0  Injury with Fall? 0  Risk for fall due to : No Fall Risks  Follow up Falls evaluation completed     Most recent depression screenings:    07/31/2024    8:17 AM 11/06/2022    1:46 PM  PHQ 2/9 Scores  PHQ - 2 Score 0 0  PHQ- 9 Score 0 1      Data saved with a previous flowsheet row definition    Vision:3/25 [facility] Dentist: 9/25 [facility]: Dr. Anner no follow up [facility] no follow up needed    Immunization History  Administered Date(s) Administered   Fluzone Influenza virus vaccine,trivalent (IIV3), split virus 05/17/2013   Influenza Split 05/20/2012   Influenza,inj,Quad PF,6+ Mos 04/07/2013   PPD Test 03/04/2013   Tdap 04/01/2015, 07/31/2024    Health Maintenance  Topic Date Due   Pneumococcal Vaccine (1 of 2 - PCV) Never done   Hepatitis B Vaccines 19-59 Average Risk (1 of 3 - 19+ 3-dose series) Never done   Cervical Cancer Screening (HPV/Pap Cotest)  05/11/2024   COVID-19 Vaccine (1) 08/16/2024 (Originally 08/11/1981)   Influenza  Vaccine  10/14/2024 (Originally 02/15/2024)   DTaP/Tdap/Td (3 - Td or Tdap) 07/31/2034   HPV VACCINES (No Doses Required) Completed   Hepatitis C Screening  Completed   HIV Screening  Completed   Meningococcal B Vaccine  Aged Out   Mammogram  Discontinued    Patient Care Team: Joyce Norleen BROCKS, MD as PCP - General (Family Medicine) Anner Alm ORN, MD as PCP - Cardiology (Cardiology)   Show/hide medication list[1]  Review of Systems  All other systems reviewed and are  negative.   Family and social history as well as health maintenance and immunizations was reviewed.     Objective:    BP 108/78   Pulse 83   Ht 5' 7 (1.702 m)   Wt 143 lb 9.6 oz (65.1 kg)   LMP 07/06/2024 (Approximate)   SpO2 98%   BMI 22.49 kg/m    Physical Exam  Alert and in no distress. Tympanic membranes and canals are normal. Pharyngeal area is normal. Neck is supple without adenopathy or thyromegaly. Cardiac exam shows a regular sinus rhythm without murmurs or gallops. Lungs are clear to auscultation.      Assessment & Plan:    Discussed health benefits of physical activity, and encouraged her to engage in regular exercise appropriate for her age and condition.  Routine general medical examination at a health care facility  Other classical Hodgkin lymphoma in remission (HCC)  MVP (mitral valve prolapse)  Raynaud's disease without gangrene  Mild episode of recurrent major depressive disorder  Other specified hypothyroidism  Vaccine counseling  Non-seasonal allergic rhinitis due to pollen  Essential hypertension  Hyperlipidemia LDL goal <100  Cervical spinal stenosis  History of cervical discectomy  History of fusion of cervical spine  History of mastectomy, bilateral  Need for Tdap vaccination   Return in about 1 year (around 07/31/2025).      Norleen Joyce, MD      [1]  Outpatient Medications Prior to Visit  Medication Sig   albuterol  (VENTOLIN  HFA) 108 (90 Base) MCG/ACT inhaler Inhale 2 puffs into the lungs every 6 (six) hours as needed for wheezing or shortness of breath.   ALPRAZolam  (XANAX ) 0.25 MG tablet TAKE 1 TABLET BY MOUTH TWICE A DAY AS NEEDED FOR ANXIETY (Patient taking differently: Take 0.25 mg by mouth as needed for anxiety.)   sertraline  (ZOLOFT ) 100 MG tablet TAKE 1 TABLET BY MOUTH EVERY DAY   SYNTHROID  88 MCG tablet TAKE 1 TABLET BY MOUTH EVERY DAY   amLODipine  (NORVASC ) 2.5 MG tablet Take 1 tablet (2.5 mg total) by mouth  daily. (Patient not taking: Reported on 07/31/2024)   benzonatate  (TESSALON ) 100 MG capsule Take 1 capsule (100 mg total) by mouth 2 (two) times daily as needed for cough. (Patient not taking: Reported on 07/31/2024)   chlorhexidine  (PERIDEX ) 0.12 % solution Use as directed 15 mLs in the mouth or throat 2 (two) times daily. Swish and spit (Patient not taking: Reported on 07/31/2024)   cyclobenzaprine  (FLEXERIL ) 10 MG tablet Take 1 tablet (10 mg total) by mouth 3 (three) times daily as needed for muscle spasms. (Patient not taking: Reported on 07/31/2024)   ibuprofen  (ADVIL ) 800 MG tablet Take 800 mg by mouth 3 (three) times daily as needed (menstrual pain.). (Patient not taking: Reported on 07/31/2024)   IRON, FERROUS GLUCONATE, PO Take by mouth daily. (Patient not taking: Reported on 07/31/2024)   Iron-Folic Acid -Vit B12 (IRON FORMULA PO) Take by mouth daily. (Patient not taking: Reported on 07/31/2024)  metoprolol  succinate (TOPROL  XL) 50 MG 24 hr tablet Take 1 tablet (50 mg total) by mouth daily. Take with or immediately following a meal. (Patient not taking: Reported on 07/31/2024)   metoprolol  tartrate (LOPRESSOR ) 100 MG tablet Take 1 tablet 2 hours prior to procedure. (Patient not taking: Reported on 07/31/2024)   Multiple Vitamin (MULTIVITAMIN ADULT PO) Take 1 Capful by mouth daily. (Patient not taking: Reported on 07/31/2024)   naproxen  (NAPROSYN ) 500 MG tablet Take 1 tablet (500 mg total) by mouth 2 (two) times daily with a meal. (Patient not taking: Reported on 07/31/2024)   olmesartan  (BENICAR ) 20 MG tablet Take 1 tablet (20 mg total) by mouth daily. (Patient not taking: Reported on 07/31/2024)   predniSONE  (DELTASONE ) 10 MG tablet Take 5 tablets 13 hours prior to CT, take 5 tablets 7 hours prior to CT, take 5 tablets 1 hour prior to CT (Patient not taking: Reported on 07/31/2024)   rosuvastatin  (CRESTOR ) 10 MG tablet Take 1 tablet (10 mg total) by mouth daily. (Patient not taking: Reported on 07/31/2024)    triamcinolone  cream (KENALOG ) 0.1 % Apply 1 Application topically 2 (two) times daily. (Patient not taking: Reported on 07/31/2024)   Vitamin D , Ergocalciferol , (DRISDOL ) 1.25 MG (50000 UNIT) CAPS capsule TAKE 1 CAPSULE BY MOUTH ONE TIME PER WEEK (Patient not taking: Reported on 07/31/2024)   No facility-administered medications prior to visit.   "

## 2024-08-03 ENCOUNTER — Ambulatory Visit: Payer: Self-pay | Admitting: Family Medicine

## 2025-07-31 ENCOUNTER — Encounter: Admitting: Family Medicine
